# Patient Record
Sex: Female | Born: 1950
Health system: Southern US, Community
[De-identification: ages and names within clinical notes are randomized; demographics above are authoritative.]

## PROBLEM LIST (undated history)

## (undated) DIAGNOSIS — Z5189 Encounter for other specified aftercare: Secondary | ICD-10-CM

## (undated) DIAGNOSIS — K219 Gastro-esophageal reflux disease without esophagitis: Secondary | ICD-10-CM

## (undated) DIAGNOSIS — I499 Cardiac arrhythmia, unspecified: Secondary | ICD-10-CM

## (undated) DIAGNOSIS — M722 Plantar fascial fibromatosis: Secondary | ICD-10-CM

## (undated) DIAGNOSIS — T753XXA Motion sickness, initial encounter: Secondary | ICD-10-CM

## (undated) DIAGNOSIS — M549 Dorsalgia, unspecified: Secondary | ICD-10-CM

## (undated) DIAGNOSIS — M1990 Unspecified osteoarthritis, unspecified site: Secondary | ICD-10-CM

## (undated) DIAGNOSIS — M542 Cervicalgia: Secondary | ICD-10-CM

## (undated) DIAGNOSIS — R200 Anesthesia of skin: Secondary | ICD-10-CM

## (undated) DIAGNOSIS — G8929 Other chronic pain: Secondary | ICD-10-CM

## (undated) DIAGNOSIS — R112 Nausea with vomiting, unspecified: Secondary | ICD-10-CM

## (undated) DIAGNOSIS — Z9889 Other specified postprocedural states: Secondary | ICD-10-CM

## (undated) HISTORY — PX: UPPER GI ENDOSCOPY: SHX6162

## (undated) HISTORY — DX: Encounter for other specified aftercare: Z51.89

## (undated) HISTORY — PX: DILATION AND CURETTAGE OF UTERUS: SHX78

## (undated) HISTORY — DX: Gastro-esophageal reflux disease without esophagitis: K21.9

## (undated) HISTORY — PX: COLONOSCOPY: SHX174

## (undated) HISTORY — DX: Plantar fascial fibromatosis: M72.2

## (undated) HISTORY — PX: JOINT REPLACEMENT: SHX530

## (undated) HISTORY — PX: ABDOMINAL HYSTERECTOMY: SHX81

## (undated) HISTORY — PX: POLYPECTOMY: SHX149

---

## 1985-01-21 HISTORY — PX: TUBAL LIGATION: SHX77

## 1998-07-10 ENCOUNTER — Other Ambulatory Visit: Admission: RE | Admit: 1998-07-10 | Discharge: 1998-07-10 | Payer: Self-pay | Admitting: Family Medicine

## 1999-07-09 ENCOUNTER — Other Ambulatory Visit: Admission: RE | Admit: 1999-07-09 | Discharge: 1999-07-09 | Payer: Self-pay | Admitting: Family Medicine

## 1999-09-03 ENCOUNTER — Encounter: Admission: RE | Admit: 1999-09-03 | Discharge: 1999-09-03 | Payer: Self-pay | Admitting: Family Medicine

## 1999-09-03 ENCOUNTER — Encounter: Payer: Self-pay | Admitting: Family Medicine

## 2000-08-20 ENCOUNTER — Other Ambulatory Visit: Admission: RE | Admit: 2000-08-20 | Discharge: 2000-08-20 | Payer: Self-pay | Admitting: Family Medicine

## 2000-09-03 ENCOUNTER — Encounter: Payer: Self-pay | Admitting: Family Medicine

## 2000-09-03 ENCOUNTER — Encounter: Admission: RE | Admit: 2000-09-03 | Discharge: 2000-09-03 | Payer: Self-pay | Admitting: Family Medicine

## 2001-09-04 ENCOUNTER — Encounter: Payer: Self-pay | Admitting: Family Medicine

## 2001-09-04 ENCOUNTER — Encounter: Admission: RE | Admit: 2001-09-04 | Discharge: 2001-09-04 | Payer: Self-pay | Admitting: Family Medicine

## 2001-10-15 ENCOUNTER — Other Ambulatory Visit: Admission: RE | Admit: 2001-10-15 | Discharge: 2001-10-15 | Payer: Self-pay | Admitting: Family Medicine

## 2002-12-21 ENCOUNTER — Encounter: Admission: RE | Admit: 2002-12-21 | Discharge: 2002-12-21 | Payer: Self-pay | Admitting: Family Medicine

## 2003-01-05 ENCOUNTER — Other Ambulatory Visit: Admission: RE | Admit: 2003-01-05 | Discharge: 2003-01-05 | Payer: Self-pay | Admitting: Family Medicine

## 2004-04-23 ENCOUNTER — Ambulatory Visit: Payer: Self-pay | Admitting: Internal Medicine

## 2004-05-01 ENCOUNTER — Ambulatory Visit: Payer: Self-pay | Admitting: Family Medicine

## 2004-09-07 ENCOUNTER — Other Ambulatory Visit: Admission: RE | Admit: 2004-09-07 | Discharge: 2004-09-07 | Payer: Self-pay | Admitting: Family Medicine

## 2004-09-07 ENCOUNTER — Ambulatory Visit: Payer: Self-pay | Admitting: Family Medicine

## 2004-09-21 ENCOUNTER — Ambulatory Visit: Payer: Self-pay | Admitting: Family Medicine

## 2004-09-28 ENCOUNTER — Encounter: Admission: RE | Admit: 2004-09-28 | Discharge: 2004-09-28 | Payer: Self-pay | Admitting: Family Medicine

## 2005-09-30 ENCOUNTER — Encounter: Admission: RE | Admit: 2005-09-30 | Discharge: 2005-09-30 | Payer: Self-pay | Admitting: Family Medicine

## 2005-11-01 ENCOUNTER — Ambulatory Visit: Payer: Self-pay | Admitting: Family Medicine

## 2005-11-01 ENCOUNTER — Other Ambulatory Visit: Admission: RE | Admit: 2005-11-01 | Discharge: 2005-11-01 | Payer: Self-pay | Admitting: Family Medicine

## 2005-11-01 ENCOUNTER — Encounter: Payer: Self-pay | Admitting: Family Medicine

## 2005-11-08 ENCOUNTER — Ambulatory Visit: Payer: Self-pay | Admitting: Family Medicine

## 2005-11-15 LAB — FECAL OCCULT BLOOD, GUAIAC: Fecal Occult Blood: NEGATIVE

## 2005-11-21 ENCOUNTER — Ambulatory Visit: Payer: Self-pay | Admitting: Family Medicine

## 2006-04-24 ENCOUNTER — Ambulatory Visit: Payer: Self-pay | Admitting: Family Medicine

## 2006-04-24 LAB — CONVERTED CEMR LAB
Basophils Relative: 0.8 % (ref 0.0–1.0)
Eosinophils Relative: 2.5 % (ref 0.0–5.0)
H Pylori IgG: NEGATIVE
Monocytes Relative: 8.6 % (ref 3.0–11.0)
Platelets: 285 10*3/uL (ref 150–400)
RBC: 4.43 M/uL (ref 3.87–5.11)
RDW: 11.9 % (ref 11.5–14.6)
WBC: 6.1 10*3/uL (ref 4.5–10.5)

## 2006-05-13 ENCOUNTER — Ambulatory Visit: Payer: Self-pay | Admitting: Gastroenterology

## 2006-05-13 LAB — CONVERTED CEMR LAB
ALT: 19 units/L (ref 0–40)
AST: 23 units/L (ref 0–37)
Basophils Relative: 1.3 % — ABNORMAL HIGH (ref 0.0–1.0)
Bilirubin, Direct: 0.1 mg/dL (ref 0.0–0.3)
CO2: 32 meq/L (ref 19–32)
Calcium: 9.1 mg/dL (ref 8.4–10.5)
Eosinophils Relative: 2.5 % (ref 0.0–5.0)
GFR calc Af Amer: 133 mL/min
Glucose, Bld: 144 mg/dL — ABNORMAL HIGH (ref 70–99)
HCT: 39.7 % (ref 36.0–46.0)
Hemoglobin: 13.7 g/dL (ref 12.0–15.0)
Lymphocytes Relative: 31.2 % (ref 12.0–46.0)
Neutro Abs: 3.3 10*3/uL (ref 1.4–7.7)
Neutrophils Relative %: 57.9 % (ref 43.0–77.0)
Platelets: 283 10*3/uL (ref 150–400)
Sodium: 143 meq/L (ref 135–145)
Total Protein: 6.5 g/dL (ref 6.0–8.3)
WBC: 5.6 10*3/uL (ref 4.5–10.5)

## 2006-06-06 ENCOUNTER — Ambulatory Visit: Payer: Self-pay | Admitting: Gastroenterology

## 2006-06-06 ENCOUNTER — Encounter: Payer: Self-pay | Admitting: Gastroenterology

## 2006-06-06 ENCOUNTER — Encounter: Payer: Self-pay | Admitting: Family Medicine

## 2006-06-06 LAB — HM COLONOSCOPY

## 2006-09-10 ENCOUNTER — Ambulatory Visit: Payer: Self-pay | Admitting: Gastroenterology

## 2006-10-08 ENCOUNTER — Encounter: Admission: RE | Admit: 2006-10-08 | Discharge: 2006-10-08 | Payer: Self-pay | Admitting: Family Medicine

## 2006-10-16 ENCOUNTER — Encounter (INDEPENDENT_AMBULATORY_CARE_PROVIDER_SITE_OTHER): Payer: Self-pay | Admitting: *Deleted

## 2006-10-20 ENCOUNTER — Encounter: Payer: Self-pay | Admitting: Family Medicine

## 2006-10-20 DIAGNOSIS — M25519 Pain in unspecified shoulder: Secondary | ICD-10-CM

## 2006-10-20 DIAGNOSIS — K219 Gastro-esophageal reflux disease without esophagitis: Secondary | ICD-10-CM

## 2006-10-22 ENCOUNTER — Ambulatory Visit: Payer: Self-pay | Admitting: Gastroenterology

## 2006-11-18 ENCOUNTER — Ambulatory Visit: Payer: Self-pay | Admitting: Family Medicine

## 2006-11-25 ENCOUNTER — Ambulatory Visit: Payer: Self-pay | Admitting: Family Medicine

## 2006-11-26 LAB — CONVERTED CEMR LAB
Basophils Relative: 0.7 % (ref 0.0–1.0)
CO2: 31 meq/L (ref 19–32)
Cholesterol: 176 mg/dL (ref 0–200)
Creatinine, Ser: 0.7 mg/dL (ref 0.4–1.2)
HCT: 39.6 % (ref 36.0–46.0)
Hemoglobin: 13.5 g/dL (ref 12.0–15.0)
LDL Cholesterol: 99 mg/dL (ref 0–99)
Monocytes Absolute: 0.5 10*3/uL (ref 0.2–0.7)
Neutrophils Relative %: 55.2 % (ref 43.0–77.0)
Potassium: 4.4 meq/L (ref 3.5–5.1)
RDW: 12.8 % (ref 11.5–14.6)
Sodium: 140 meq/L (ref 135–145)
TSH: 0.71 microintl units/mL (ref 0.35–5.50)
Total Bilirubin: 1 mg/dL (ref 0.3–1.2)
Total Protein: 7 g/dL (ref 6.0–8.3)
VLDL: 8 mg/dL (ref 0–40)

## 2007-03-31 DIAGNOSIS — K209 Esophagitis, unspecified without bleeding: Secondary | ICD-10-CM | POA: Insufficient documentation

## 2007-03-31 DIAGNOSIS — D126 Benign neoplasm of colon, unspecified: Secondary | ICD-10-CM

## 2007-10-09 ENCOUNTER — Telehealth: Payer: Self-pay | Admitting: Gastroenterology

## 2007-10-09 ENCOUNTER — Encounter: Admission: RE | Admit: 2007-10-09 | Discharge: 2007-10-09 | Payer: Self-pay | Admitting: Family Medicine

## 2007-11-02 ENCOUNTER — Ambulatory Visit: Payer: Self-pay | Admitting: Gastroenterology

## 2007-11-02 DIAGNOSIS — Z860100 Personal history of colon polyps, unspecified: Secondary | ICD-10-CM | POA: Insufficient documentation

## 2008-07-06 ENCOUNTER — Encounter: Payer: Self-pay | Admitting: Family Medicine

## 2008-07-06 ENCOUNTER — Ambulatory Visit: Payer: Self-pay | Admitting: Family Medicine

## 2008-07-06 ENCOUNTER — Other Ambulatory Visit: Admission: RE | Admit: 2008-07-06 | Discharge: 2008-07-06 | Payer: Self-pay | Admitting: Family Medicine

## 2008-07-06 DIAGNOSIS — D485 Neoplasm of uncertain behavior of skin: Secondary | ICD-10-CM

## 2008-07-06 LAB — HM PAP SMEAR

## 2008-07-08 LAB — CONVERTED CEMR LAB
ALT: 22 units/L (ref 0–35)
Albumin: 3.9 g/dL (ref 3.5–5.2)
BUN: 16 mg/dL (ref 6–23)
Basophils Relative: 0.5 % (ref 0.0–3.0)
Chloride: 98 meq/L (ref 96–112)
Cholesterol: 178 mg/dL (ref 0–200)
Eosinophils Relative: 1.2 % (ref 0.0–5.0)
HCT: 38.8 % (ref 36.0–46.0)
Hemoglobin: 13.3 g/dL (ref 12.0–15.0)
LDL Cholesterol: 91 mg/dL (ref 0–99)
Lymphs Abs: 1.8 10*3/uL (ref 0.7–4.0)
MCV: 90.6 fL (ref 78.0–100.0)
Monocytes Absolute: 0.5 10*3/uL (ref 0.1–1.0)
Neutro Abs: 3 10*3/uL (ref 1.4–7.7)
Platelets: 269 10*3/uL (ref 150.0–400.0)
Potassium: 4.2 meq/L (ref 3.5–5.1)
TSH: 0.5 microintl units/mL (ref 0.35–5.50)
Total Bilirubin: 1.1 mg/dL (ref 0.3–1.2)
Total Protein: 7 g/dL (ref 6.0–8.3)
WBC: 5.4 10*3/uL (ref 4.5–10.5)

## 2008-07-11 ENCOUNTER — Encounter (INDEPENDENT_AMBULATORY_CARE_PROVIDER_SITE_OTHER): Payer: Self-pay | Admitting: *Deleted

## 2008-07-22 ENCOUNTER — Encounter: Payer: Self-pay | Admitting: Family Medicine

## 2008-07-22 ENCOUNTER — Ambulatory Visit: Payer: Self-pay | Admitting: Family Medicine

## 2008-10-11 ENCOUNTER — Encounter: Admission: RE | Admit: 2008-10-11 | Discharge: 2008-10-11 | Payer: Self-pay | Admitting: Family Medicine

## 2008-10-12 ENCOUNTER — Encounter (INDEPENDENT_AMBULATORY_CARE_PROVIDER_SITE_OTHER): Payer: Self-pay | Admitting: *Deleted

## 2008-11-21 ENCOUNTER — Telehealth: Payer: Self-pay | Admitting: Gastroenterology

## 2009-07-13 ENCOUNTER — Ambulatory Visit: Payer: Self-pay | Admitting: Family Medicine

## 2009-07-14 LAB — CONVERTED CEMR LAB
AST: 18 units/L (ref 0–37)
Albumin: 3.9 g/dL (ref 3.5–5.2)
Alkaline Phosphatase: 62 units/L (ref 39–117)
Basophils Relative: 0.9 % (ref 0.0–3.0)
Bilirubin, Direct: 0.1 mg/dL (ref 0.0–0.3)
Calcium: 9.3 mg/dL (ref 8.4–10.5)
Creatinine, Ser: 0.7 mg/dL (ref 0.4–1.2)
GFR calc non Af Amer: 93.5 mL/min (ref >60)
HDL: 65.3 mg/dL (ref >39.00)
Hemoglobin: 12 g/dL (ref 12.0–15.0)
LDL Cholesterol: 93 mg/dL (ref 0–99)
Lymphocytes Relative: 29.7 % (ref 12.0–46.0)
Monocytes Relative: 8.5 % (ref 3.0–12.0)
Neutro Abs: 3.5 10*3/uL (ref 1.4–7.7)
Neutrophils Relative %: 57.9 % (ref 43.0–77.0)
RBC: 3.85 M/uL — ABNORMAL LOW (ref 3.87–5.11)
Sodium: 141 meq/L (ref 135–145)
Total CHOL/HDL Ratio: 3
Total Protein: 6.4 g/dL (ref 6.0–8.3)
Triglycerides: 47 mg/dL (ref 0.0–149.0)
WBC: 6.1 10*3/uL (ref 4.5–10.5)

## 2009-07-28 ENCOUNTER — Ambulatory Visit: Payer: Self-pay | Admitting: Family Medicine

## 2009-07-28 DIAGNOSIS — M722 Plantar fascial fibromatosis: Secondary | ICD-10-CM

## 2009-10-19 ENCOUNTER — Encounter: Admission: RE | Admit: 2009-10-19 | Discharge: 2009-10-19 | Payer: Self-pay | Admitting: Family Medicine

## 2009-10-19 LAB — HM MAMMOGRAPHY: HM Mammogram: NORMAL

## 2009-10-23 ENCOUNTER — Encounter: Payer: Self-pay | Admitting: Family Medicine

## 2009-11-13 ENCOUNTER — Telehealth: Payer: Self-pay | Admitting: Gastroenterology

## 2009-12-18 ENCOUNTER — Ambulatory Visit: Payer: Self-pay | Admitting: Gastroenterology

## 2010-01-25 ENCOUNTER — Telehealth: Payer: Self-pay | Admitting: Family Medicine

## 2010-02-05 ENCOUNTER — Telehealth: Payer: Self-pay | Admitting: Family Medicine

## 2010-02-08 ENCOUNTER — Telehealth: Payer: Self-pay | Admitting: Family Medicine

## 2010-02-09 ENCOUNTER — Ambulatory Visit
Admission: RE | Admit: 2010-02-09 | Discharge: 2010-02-09 | Payer: Self-pay | Source: Home / Self Care | Attending: Family Medicine | Admitting: Family Medicine

## 2010-02-20 NOTE — Letter (Signed)
Summary: Results Follow up Letter  [facility]  [address] [place], [place] [address]   Phone: [phone]  Fax: [phone]    10/23/2009 MRN: [id number]    OVETA IDRIS [address] [place], [place]  [address]    Dear Ms. Christen Bame,  The following are the results of your recent test(s):  Test         Result    Pap Smear:        Normal _____  Not Normal _____ Comments: ______________________________________________________ Cholesterol: LDL(Bad cholesterol):         Your goal is less than:         HDL (Good cholesterol):       Your goal is more than: Comments:  ______________________________________________________ Mammogram:        Normal ___X__  Not Normal _____ Comments:Repeat in one year.   ___________________________________________________________________ Hemoccult:        Normal _____  Not normal _______ Comments:    _____________________________________________________________________ Other Tests:    We routinely do not discuss normal results over the telephone.  If you desire a copy of the results, or you have any questions about this information we can discuss them at your next office visit.   Sincerely,    Idamae Schuller Tower,MD  MT/ri

## 2010-02-20 NOTE — Progress Notes (Signed)
Summary: rx request   Phone Note Call from Patient Call back at [phone]   Caller: Patient Call For: Dr. Christella Hartigan Reason for Call: Refill Medication Summary of Call: would like a new rx [facility] in to [facility] [address], [phone] Initial call taken by: Vallarie Mare,  November 13, 2009 9:21 AM  Follow-up for Phone Call        pt scheduled ROV it has been 2 years since last office visit Follow-up by: Chales Abrahams CMA Duncan Dull),  November 13, 2009 10:48 AM    Prescriptions: PROTONIX 40 MG  TBEC (PANTOPRAZOLE SODIUM) one by mouth once daily (best taken 20-30 min prior to breakfast meal)  #30 x 11   Entered by:   Chales Abrahams CMA (AAMA)   Authorized by:   Rachael Fee MD   Signed by:   Chales Abrahams CMA (AAMA) on 11/13/2009   Method used:   Electronically to        [facility]  [address] [phone]* (retail)       [address]       [place], [place]  [address]       Ph: [phone]       Fax: [phone]   RxID:   [id number]

## 2010-02-20 NOTE — Assessment & Plan Note (Signed)
Summary: CPX/DLO   Vital Signs:  Patient profile:   60 year old female Height:      69.25 inches Weight:      170 pounds BMI:     25.01 Temp:     98.3 degrees F oral Pulse rate:   64 / minute Pulse rhythm:   regular BP sitting:   116 / 70  (left arm) Cuff size:   regular  Vitals Entered By: Lewanda Rife LPN (July 28, 1608 2:22 PM) CC: CPX with pap and breast exam LMP 11 yrs ago   History of Present Illness: here for health mt exam and to disc chronic problems  feeling fine- nothing new   tries to take good care of herself  wt is stable   bp is 116/70  nl dexa in 8/08  BTL in past  is menopausal is taking calcium and vit D and also gets outdoors   lipids this check fairly stable - with trig 47 and HDL 65 and LDL 93 pap 6/10--never had abn pap  no gyn symptoms or problems  mam 9/10  no lumps on self breast exam   colon polyps - due colonosc in 2013- no stool changes   gluc 110 this draw   is doing well on protonix -- no problems with that   no hx of fractures    Allergies: 1)  Neosporin  Past History:  Past Surgical History: Last updated: 03/31/2007 Dexa- normal (08/2000) Colonoscopy/ EGD- esophagitis, polyps (05/2006) Dexa- normal (08/2006) Tubal ligation 1987  Family History: Last updated: 2006-12-10 Father: deceased age 39- suicide Mother: 2 strokes, congenital arrhythmia, chf, depression Siblings: 1 brother aunt breast ca  aunt OP brother prostate ca  Social History: Last updated: 10/20/2006 Marital Status: Married Children: 2- 1 is adopted Occupation: Runner, broadcasting/film/video  Risk Factors: Smoking Status: never (10/20/2006)  Past Medical History: GERD with esophagitis  plantar fasciits L   Review of Systems General:  Denies fatigue, loss of appetite, and malaise. Eyes:  Denies blurring and eye irritation. CV:  Denies chest pain or discomfort, lightheadness, palpitations, and shortness of breath with exertion. Resp:  Denies cough, shortness of  breath, and wheezing. GI:  Denies abdominal pain, change in bowel habits, indigestion, and nausea. GU:  Denies abnormal vaginal bleeding, discharge, dysuria, and urinary frequency. MS:  Denies joint pain, cramps, and muscle weakness. Derm:  Denies itching, lesion(s), poor wound healing, and rash. Neuro:  Denies numbness and tingling. Psych:  Denies anxiety and depression. Endo:  Denies cold intolerance, excessive thirst, and heat intolerance. Heme:  Denies abnormal bruising and bleeding.  Physical Exam  General:  Well-developed,well-nourished,in no acute distress; alert,appropriate and cooperative throughout examination Head:  normocephalic, atraumatic, and no abnormalities observed.   Eyes:  vision grossly intact, pupils equal, pupils round, and pupils reactive to light.  no conjunctival pallor, injection or icterus  Ears:  R ear normal and L ear normal.   Nose:  no nasal discharge.   Mouth:  pharynx pink and moist.   Neck:  supple with full rom and no masses or thyromegally, no JVD or carotid bruit  Chest Wall:  No deformities, masses, or tenderness noted. Breasts:  No mass, nodules, thickening, tenderness, bulging, retraction, inflamation, nipple discharge or skin changes noted.   Lungs:  Normal respiratory effort, chest expands symmetrically. Lungs are clear to auscultation, no crackles or wheezes. Heart:  Normal rate and regular rhythm. S1 and S2 normal without gallop, murmur, click, rub or other extra sounds. Abdomen:  Bowel sounds positive,abdomen soft and non-tender without masses, organomegaly or hernias noted. no renal bruits  Msk:  strap on L foot -mild tenderness in arch no kyphosis no acute joint changes Pulses:  R and L carotid,radial,femoral,dorsalis pedis and posterior tibial pulses are full and equal bilaterally Extremities:  No clubbing, cyanosis, edema, or deformity noted with normal full range of motion of all joints.   Neurologic:  sensation intact to light touch,  gait normal, and DTRs symmetrical and normal.   Skin:  Intact without suspicious lesions or rashes Cervical Nodes:  No lymphadenopathy noted Axillary Nodes:  No palpable lymphadenopathy Inguinal Nodes:  No significant adenopathy Psych:  normal affect, talkative and pleasant    Impression & Recommendations:  Problem # 1:  HEALTH MAINTENANCE EXAM (ICD-V70.0) Assessment Comment Only reviewed health habits including diet, exercise and skin cancer prevention reviewed health maintenance list and family history enc to keep up the good health habits  rev labs in detail today slt dec hb from recent blood donation  Problem # 2:  PERSONAL HX COLONIC POLYPS (ICD-V12.72) Assessment: Comment Only due colonosc 2013- no symptoms  Problem # 3:  GERD (ICD-530.81) Assessment: Unchanged doing well with protonix and diet- no problems  Her updated medication list for this problem includes:    Protonix 40 Mg Tbec (Pantoprazole sodium) ..... One by mouth once daily (best taken 20-30 min prior to breakfast meal)  Problem # 4:  OTHER SCREENING MAMMOGRAM (ICD-V76.12) Assessment: New annual mammogram scheduled adv pt to continue regular self breast exams non remarkable breast exam today  Orders: Radiology Referral (Radiology)  Problem # 5:  PLANTAR FASCIITIS, LEFT (ICD-728.71) Assessment: New wearing strap today- slowly imp after 2 inj  will continue orthotics and f/u with podiatrist  Complete Medication List: 1)  Sm Calcium 500 Mg Tabs (Oyster shell) .... Two by mouth qd 2)  Protonix 40 Mg Tbec (Pantoprazole sodium) .... One by mouth once daily (best taken 20-30 min prior to breakfast meal) 3)  Glucosamine-chondroitin-msm 500-250-250 Mg Caps (Glucosamine-chondroitin-msm) .... By mouth daily  Patient Instructions: 1)  we will schedule annual mammogram (due in sept) at check out  2)  no change in medicines  3)  try to keep up healthy diet and exercise   Current Allergies (reviewed  today): NEOSPORIN

## 2010-02-20 NOTE — Assessment & Plan Note (Signed)
  GASTROINTESTINAL PROBLEM LIST: 1. Mild esophagitis seen May 2008, EGD by me.  Biopsy showed no Barrett's.  Fundic gland polyps also biopsied showed no neoplasia.  November, 2011: GERD symptoms under good control on protonix. once daily 2. Colonic tubular adenomas May 2008.  Next colonoscopy May 2013.   History of Present Illness Visit Type: Follow-up Visit Primary GI MD: Rob Bunting MD Primary Provider: [facility] Chief Complaint: Med refills History of Present Illness:     very pleasant 60 year old woman whom I last saw about 2 years ago. she's taking protonix once daily, first thing in AM and eats BF 30-40 min later.  On this regimine she feels very good.  Only rare pyrosis. She will take a tums as needed (about once a month). this is much better than it was previously.  She feels that the PPI helps with her loose stools.  She has mild pill associated dysphagia (not foods or water), overall her weight is up a bit.  9 pounds since last visit here 2 years ago.  NO overt GI bleeding.           Current Medications (verified): 1)  Sm Calcium 500 Mg  Tabs (Oyster Shell) .... Two By Mouth Qd 2)  Protonix 40 Mg  Tbec (Pantoprazole Sodium) .... One By Mouth Once Daily (Best Taken 20-30 Min Prior To Breakfast Meal) 3)  Glucosamine-Chondroitin-Msm 500-250-250 Mg Caps (Glucosamine-Chondroitin-Msm) .... By Mouth Daily  Allergies (verified): 1)  Neosporin  Vital Signs:  Patient profile:   60 year old female Height:      69.25 inches Weight:      174 pounds BMI:     25.60 Pulse rate:   72 / minute Pulse rhythm:   regular BP sitting:   90 / 58  (left arm) Cuff size:   regular  Vitals Entered By: June McMurray CMA Duncan Dull) (December 18, 2009 8:47 AM)  Physical Exam  Additional Exam:  Constitutional: generally well appearing Psychiatric: alert and oriented times 3 Abdomen: soft, non-tender, non-distended, normal bowel sounds    Impression & Recommendations:  Problem # 1:   GERD, no alarm symptoms she has some very mild he'll associated dysphasia but no dysphasia to food or water. Her weight is increasing and she has no overt GI bleeding. She will continue taking proton pump inhibitor once daily in the morning and even try to cut back to every other day if tolerated.  Problem # 2:  tubular adenomas she is set up for repeat colonoscopy at five-year interval from her last procedure.  Patient Instructions: 1)  Stay on protontix one pill in AM.  Try cutting back to every other day, if tolerated. 2)  Next colonoscopy is May 2013, you will get a reminder in the mail around then. 3)  A copy of this information will be sent to Dr. Milinda Antis. 4)  The medication list was reviewed and reconciled.  All changed / newly prescribed medications were explained.  A complete medication list was provided to the patient / caregiver.

## 2010-02-22 NOTE — Progress Notes (Signed)
Summary: wants tdap  Phone Note Call from Patient Call back at [phone]   Caller: Patient Call For: Judith Part MD Summary of Call: Patient says that she is around her grandson alot and he is 35 months old. She is really concerned with the pertussis going around. She is asking if she could get the vaccine. Is it okay to schedule nurse visit for this? Initial call taken by: Melody Comas,  February 08, 2010 9:36 AM  Follow-up for Phone Call        that is fine  Follow-up by: Judith Part MD,  February 08, 2010 10:56 AM  Additional Follow-up for Phone Call Additional follow up Details #1::        Message left on voicemail notifying patient that it was okay to have vaccine and to call back and schedule a nurse visit.  Additional Follow-up by: Janee Morn CMA Duncan Dull),  February 08, 2010 11:16 AM

## 2010-02-22 NOTE — Progress Notes (Signed)
Summary: regarding skin tag  Phone Note Call from Patient Call back at [phone]   Caller: Patient Summary of Call: Pt states she has a skin tag on her face that she wants removed.  She said this is on a long stalk and it gets irritated.  She is asking if you can do this or shoud she go to a dermatologist. Initial call taken by: Lowella Petties CMA, AAMA,  January 25, 2010 9:41 AM  Follow-up for Phone Call        I generally refer face work to derm  would she like a ref or make her own appt? Follow-up by: Judith Part MD,  January 25, 2010 12:52 PM  Additional Follow-up for Phone Call Additional follow up Details #1::        Patient notified as instructed by telephone. Pt said she is established with a dermatologist in [place] and will make her own appt.Lewanda Rife LPN  January 25, 2010 2:58 PM

## 2010-02-22 NOTE — Progress Notes (Signed)
Summary: pt wants Hep A vaccine  Phone Note Call from Patient   Caller: Patient Call For: Judith Part MD Summary of Call: [place] and was told she should get Hep A vaccine, please advise. Initial call taken by: Lowella Petties CMA, AAMA,  February 05, 2010 9:50 AM  Follow-up for Phone Call        can schedule that here  may want to check with insurance first  I think it is a good idea if she has never had one Follow-up by: Judith Part MD,  February 05, 2010 10:47 AM  Additional Follow-up for Phone Call Additional follow up Details #1::        Left message on machine for patient to call back. Sydell Axon LPN  February 05, 2010 10:57 AM  Patient notified as instructed by telephone. Additional Follow-up by: Sydell Axon LPN,  February 05, 2010 11:00 AM

## 2010-02-22 NOTE — Assessment & Plan Note (Signed)
Summary: tdap/[place]ola Truax/alc   Nurse Visit   Allergies: 1)  Neosporin  Immunizations Administered:  Tetanus Vaccine:    Vaccine Type: Tdap    Site: right deltoid    Mfr: GlaxoSmithKline    Dose: 0.5 ml    Route: IM    Given by: Linde Gillis CMA (AAMA)    Exp. Date: 11/10/2011    Lot #: [id number]    VIS given: 12/09/07 version given February 09, 2010.  Orders Added: 1)  Tdap => 82yrs IM [90715] 2)  Admin 1st Vaccine [09811]

## 2010-06-05 NOTE — Assessment & Plan Note (Signed)
[facility]                         GASTROENTEROLOGY OFFICE NOTE   NAME:ANGYALDerrick, Orris                    MRN:          [id number]  DATE:10/22/2006                            DOB:          Oct 23, 1950    PRIMARY CARE PHYSICIAN:  Dr. Roxy Manns.   GASTROINTESTINAL PROBLEM LIST:  1. Mild esophagitis seen May 2008, EGD by me.  Biopsy showed no      Barrett's.  Fundic gland polyps also biopsied showed no neoplasia.  2. Colonic tubular adenomas May 2008.  Next colonoscopy May 2013.   INTERVAL HISTORY:  I last saw Crystalee a month and a half ago. Since  then she has been on Protonix 1 pill a day taken 20 to 30 minutes prior  to her breakfast meal. She says for the most part she feels very well,  although she does have intermittent breakthrough symptoms and she will  take either Tagamet or Prilosec and that helps very reliably. As I have  described before her symptoms are unusual for GERD (urgent bowel  movement, pains in her left lower quadrant) but these symptoms are very  reliably improved on antacid medicines.   CURRENT MEDICATIONS:  1. Multivitamins.  2. Calcium.  3. SAM-e melatonin.  4. Protonix 40 mg once daily.   PHYSICAL EXAMINATION:  Weight 169 pounds which is up 2 pounds since our  last visit. Blood pressure 112/72, pulse 68.  CONSTITUTIONALLY: Generally well-appearing.  ABDOMEN: Soft, nontender, nondistended. Normal bowel sounds.   ASSESSMENT/PLAN:  A 60 year old woman with intermittent abdominal pains  and intermittent loose stools.   It is unusual to have this symptom complex with gastroesophageal reflux  disease but it is hard to argue the fact that her symptoms are so  reliably improved on proton pump inhibitors. She will continue taking  Protonix for now once daily. She knows to take breakthrough antacid  medicines as needed. I recommended she try cutting back on the antacids  periodically to see if she is able to wean herself  off.     Rachael Fee, MD  Electronically Signed   DPJ/MedQ  DD: 10/22/2006  DT: 10/23/2006  Job #: [id number]   cc:   Marne A. Milinda Antis, MD

## 2010-06-05 NOTE — Assessment & Plan Note (Signed)
[facility]                         GASTROENTEROLOGY OFFICE NOTE   NAME:[name], Shannon [name]                    MRN:          [id number]  DATE:09/10/2006                            DOB:          [birth year]    PRIMARY CARE PHYSICIAN:  Shannon A. Tower, MD.   GASTROINTESTINAL PROBLEM LIST:  1. Mild esophagitis seen May 2008, EGD by me.  Biopsy showed no      Barrett's.  Fundic gland polyps also biopsied showed no neoplasia.  2. Colonic tubular adenomas May 2008.  Next colonoscopy May 2013.   INTERVAL HISTORY:  I last saw Shannon [name] at the time of her upper and  lower endoscopies three months ago.  She has very unusual compilation of  symptoms in that she has epigastric discomfort, urgent bowel movements,  pains in her left lower quadrant.  She says on antacids medicines, all  these symptoms are very much improved.  She was taking Prilosec OTC but  that seemed to wane in its efficacy and so she started on Protonix 40 a  day and that is definitely helping.   CURRENT MEDICATIONS:  1. Multivitamin.  2. Calcium.  3. SAM-e.  4. Melatonin.  5. Protonix 40 mg once daily.   PHYSICAL EXAMINATION:  VITAL SIGNS:  Weight 167 pounds which is up 1  pound since her last visit.  Blood pressure 104/66, pulse 64.  GENERAL APPEARANCE:  Generally well-appearing.  ABDOMEN:  Soft, nontender, nondistended with normal bowel sounds.   ASSESSMENT/PLAN:  A 60 year old woman with intermittent abdominal pains  and loose stools.   She is very convinced that acid suppression with proton pump inhibitors  definitely improve her intermittent symptoms.  She has no classic  gastroesophageal reflux disease symptoms.  She does, however, have  esophagitis so I have kept her on proton pump inhibitor even though I do  not think that gastroesophageal reflux disease necessarily explains all  of her symptoms.  Perhaps she simply has irritable bowel syndrome that  alternates.  I recommended  that she continue on the antacid medicine  daily and I will call her in a prescription for Levsin to be taken  on an as needed basis when she is having discomfort.  She will return to  see me in six to eight weeks and sooner as needed.     Shannon Fee, MD  Electronically Signed    DPJ/MedQ  DD: 09/10/2006  DT: 09/11/2006  Job #: [id number]   cc:   Shannon A. Milinda Antis, MD

## 2010-06-08 NOTE — Assessment & Plan Note (Signed)
[facility]                         GASTROENTEROLOGY OFFICE NOTE   NAME:Capuano, CARLIYAH COTTERMAN                    MRN:          [id number]  DATE:05/13/2006                            DOB:          1950-09-02    Dr. Milinda Antis asked me to evaluate Ms. Prater in consultation regarding  intermittent abdominal pains, bowel symptoms, question reflux.   HISTORY OF PRESENT ILLNESS:  Ms. Acero is a very pleasant 60 year old  woman who has had 10 years of intermittent abdominal discomforts.  She  describes the pain as in her left lower quadrant associated with a  change in her bowel habits, usually urgent BMs, some increased frequency  up to 8-10 times a day, however they are not watery or bloody.  This  will go on for several days, she has found that if she takes anti-acid  medicines such as Prilosec OTC at the outset of these symptoms that that  will usually improve and lessen the severity, so she is not blaming GERD  on these symptoms.  They have become more frequent lately, in the past 5  years especially.  She has also noticed that eating salads tends to make  them worse.  She has no nausea or vomiting, no fevers or chills.   Recent lab testing April 2008 show a normal CBC and a negative H. pylori  serologies.   REVIEW OF SYSTEMS:  She has gained 5 pounds in the past year, she does  notice that eating excessive amounts of dairy tends to cause bloating  and discomforts.  The rest of her review of systems is essentially  normal and is available on her nursing intake sheet.   PAST MEDICAL HISTORY:  Tubal ligation 1987.   CURRENT MEDICATIONS:  1. Multivitamin.  2. Calcium.  3. Iron once daily.  4. SAM-e 400 mg once daily.  5. Melatonin.  6. Prilosec 20 mg once to twice a day.   ALLERGIES:  NO KNOWN DRUG ALLERGIES.   SOCIAL HISTORY:  Married with 2 sons.  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  No caffeine, no alcohol,  nonsmoker.   FAMILY HISTORY:  No colon cancer, colon polyps in family.   PHYSICAL EXAMINATION:  Height 5 foot 10 inches, 166 pounds, blood  pressure 92/60, pulse 60.  CONSTITUTIONAL:  Generally well-appearing.  NEUROLOGIC:  Alert and oriented x3.  EYES:  Extraocular movements intact.  MOUTH:  Oropharynx moist, no lesions.  NECK:  Supple, no lymphadenopathy.  CARDIOVASCULAR:  Heart regular rate and rhythm.  LUNGS:  Clear to auscultation bilaterally.  ABDOMEN:  Soft, nontender, nondistended, normal bowel sounds.  EXTREMITIES:  No lower extremity edema.  SKIN:  No rash or lesions on visible extremities.   ASSESSMENT/PLAN:  60 year old woman with recurrent left-sided  lower abdominal discomforts, bowel symptoms, somewhat improved by anti-  acid medicines.   It is very rare for gastroesophageal reflux disease or acid related  phenomenons to cause such low left-sided abdominal discomfort associated  with bowel changes.  She is very clear however that this symptom complex  is usually improved by an anti-acid  medicine.  Much more common is  irritable bowel or perhaps mild diverticulitis, although I do not think  she currently has diverticulitis.  I think we should proceed with a full  colonoscopy as well as upper endoscopy.  Perhaps she does have bad  gastroesophageal reflux disease and esophagitis.  If that is the case  and her colonoscopy is normal, then I will get her on twice daily anti-  acid medicines.  If however her exams are normal I will try  antispasmodics and periodic Imodium.  We will therefore arrange for her  to have colonoscopy as well as upper endoscopy at her soonest  convenience.  I am also getting complete metabolic panel, thyroid  testing, and sprue testing by labs.     Rachael Fee, MD  Electronically Signed    DPJ/MedQ  DD: 05/13/2006  DT: 05/13/2006  Job #: [id number]   cc:   Marne A. Milinda Antis, MD

## 2010-07-26 ENCOUNTER — Telehealth: Payer: Self-pay | Admitting: Family Medicine

## 2010-07-26 DIAGNOSIS — Z0000 Encounter for general adult medical examination without abnormal findings: Secondary | ICD-10-CM

## 2010-07-26 NOTE — Telephone Encounter (Signed)
Message copied by Judy Pimple on Thu Jul 26, 2010  8:30 AM ------      Message from: Baldomero Lamy      Created: Tue Jul 24, 2010 12:00 PM      Regarding: cpx labs fri       Please order  future cpx labs for pt's upcomming lab appt.      Thanks      Rodney Booze

## 2010-07-27 ENCOUNTER — Other Ambulatory Visit (INDEPENDENT_AMBULATORY_CARE_PROVIDER_SITE_OTHER): Payer: BC Managed Care – PPO | Admitting: Family Medicine

## 2010-07-27 DIAGNOSIS — Z0000 Encounter for general adult medical examination without abnormal findings: Secondary | ICD-10-CM

## 2010-07-27 LAB — CBC WITH DIFFERENTIAL/PLATELET
Basophils Absolute: 0 10*3/uL (ref 0.0–0.1)
Eosinophils Relative: 1.9 % (ref 0.0–5.0)
HCT: 40.3 % (ref 36.0–46.0)
Hemoglobin: 13.7 g/dL (ref 12.0–15.0)
Lymphocytes Relative: 34.5 % (ref 12.0–46.0)
Lymphs Abs: 1.7 10*3/uL (ref 0.7–4.0)
Monocytes Relative: 8.3 % (ref 3.0–12.0)
Platelets: 258 10*3/uL (ref 150.0–400.0)
RDW: 13.5 % (ref 11.5–14.6)
WBC: 4.9 10*3/uL (ref 4.5–10.5)

## 2010-07-27 LAB — COMPREHENSIVE METABOLIC PANEL
ALT: 20 U/L (ref 0–35)
Albumin: 4.1 g/dL (ref 3.5–5.2)
CO2: 32 mEq/L (ref 19–32)
Calcium: 9.2 mg/dL (ref 8.4–10.5)
Chloride: 104 mEq/L (ref 96–112)
GFR: 75.08 mL/min (ref >60.00)
Glucose, Bld: 108 mg/dL — ABNORMAL HIGH (ref 70–99)
Potassium: 4.4 mEq/L (ref 3.5–5.1)
Sodium: 141 mEq/L (ref 135–145)
Total Bilirubin: 1.2 mg/dL (ref 0.3–1.2)
Total Protein: 6.5 g/dL (ref 6.0–8.3)

## 2010-07-27 LAB — LIPID PANEL
HDL: 69.5 mg/dL (ref >39.00)
Total CHOL/HDL Ratio: 3

## 2010-07-27 LAB — TSH: TSH: 0.66 u[IU]/mL (ref 0.35–5.50)

## 2010-07-28 ENCOUNTER — Encounter: Payer: Self-pay | Admitting: Family Medicine

## 2010-08-03 ENCOUNTER — Other Ambulatory Visit: Payer: Self-pay

## 2010-08-10 ENCOUNTER — Encounter: Payer: Self-pay | Admitting: Family Medicine

## 2010-08-10 ENCOUNTER — Ambulatory Visit (INDEPENDENT_AMBULATORY_CARE_PROVIDER_SITE_OTHER): Payer: BC Managed Care – PPO | Admitting: Family Medicine

## 2010-08-10 DIAGNOSIS — D126 Benign neoplasm of colon, unspecified: Secondary | ICD-10-CM

## 2010-08-10 DIAGNOSIS — Z0000 Encounter for general adult medical examination without abnormal findings: Secondary | ICD-10-CM

## 2010-08-10 NOTE — Assessment & Plan Note (Signed)
Due for colonosc 5/13-pt is aware (5 year f/u) No bowel or stool changes

## 2010-08-10 NOTE — Assessment & Plan Note (Signed)
Reviewed health habits including diet and exercise and skin cancer prevention Also reviewed health mt list, fam hx and immunizations   Wellness labs look good  Breast exam today

## 2010-08-10 NOTE — Progress Notes (Signed)
Subjective:    Patient ID: Shannon [name], female    DOB: [birth year], 60 y.o.   MRN: [id number]  HPI Here for annual exam / health mt and to review chronic med problems  Zoster status- never had the disease May be interested in vaccine   tdap is up to date   Has been doing pretty well - no complaints   Pap 6/10 Gyn- no gyn problems or symptoms   Mam 9/11 normal - wants to schedule herself  Self exam   colonosc 08 adenomatous polyp- will be due in 2013  No bowel changes or blood in stool   Wt is up 3 lb  Good bp  Wellness lab ok Sugar stabl 108  Lab Results  Component Value Date   CHOL 177 07/27/2010   CHOL 168 07/13/2009   CHOL 178 07/06/2008   Lab Results  Component Value Date   HDL 69.50 07/27/2010   HDL 65.30 07/13/2009   HDL 40.98 07/06/2008   Lab Results  Component Value Date   LDLCALC 98 07/27/2010   LDLCALC 93 07/13/2009   LDLCALC 91 07/06/2008   Lab Results  Component Value Date   TRIG 50.0 07/27/2010   TRIG 47.0 07/13/2009   TRIG 46.0 07/06/2008   Lab Results  Component Value Date   CHOLHDL 3 07/27/2010   CHOLHDL 3 07/13/2009   CHOLHDL 2 07/06/2008   No results found for this basename: LDLDIRECT   overall good   Patient Active Problem List  Diagnoses  . TUBULOVILLOUS ADENOMA, COLON  . NEOPLASM, SKIN, UNCERTAIN BEHAVIOR  . ESOPHAGITIS, MILD  . GERD  . PAIN IN JOINT, SHOULDER  . PLANTAR FASCIITIS, LEFT  . PERSONAL HX COLONIC POLYPS  . Routine general medical examination at a health care facility   Past Medical History  Diagnosis Date  . GERD (gastroesophageal reflux disease)     with esophagitis  . Plantar fasciitis     left   Past Surgical History  Procedure Date  . Tubal ligation 1987   History  Substance Use Topics  . Smoking status: Never Smoker   . Smokeless tobacco: Not on file  . Alcohol Use: Not on file   Family History  Problem Relation Age of Onset  . Stroke Mother     x 2  . Heart disease Mother     congenital arrhythmia  and CHF  . Depression Mother   . Cancer Brother     prostate   Allergies  Allergen Reactions  . Triple Antibiotic     REACTION: rash   Current Outpatient Prescriptions on File Prior to Visit  Medication Sig Dispense Refill  . Glucosamine-Chondroitin-MSM 500-250-250 MG CAPS Take 1 capsule by mouth 2 (two) times daily.       . pantoprazole (PROTONIX) 40 MG tablet Take 40 mg by mouth daily. Best taken 20-30 min prior to breakfast meal.            Review of Systems Review of Systems  Constitutional: Negative for fever, appetite change, fatigue and unexpected weight change.  Eyes: Negative for pain and visual disturbance.  Respiratory: Negative for cough and shortness of breath.   Cardiovascular: Negative.  for cp or sob or palp Gastrointestinal: Negative for nausea, diarrhea and constipation.  Genitourinary: Negative for urgency and frequency.  Skin: Negative for pallor.or rash  Neurological: Negative for weakness, light-headedness, numbness and headaches.  Hematological: Negative for adenopathy. Does not bruise/bleed easily.  Psychiatric/Behavioral: Negative for dysphoric mood. The patient is not  nervous/anxious.          Objective:   Physical Exam  Constitutional: She appears well-developed and well-nourished. No distress.  HENT:  Head: Normocephalic and atraumatic.  Right Ear: External ear normal.  Left Ear: External ear normal.  Nose: Nose normal.  Mouth/Throat: Oropharynx is clear and moist.  Eyes: EOM are normal. Pupils are equal, round, and reactive to light.  Neck: Normal range of motion. Neck supple. No JVD present. Carotid bruit is not present. No thyromegaly present.  Cardiovascular: Normal rate, regular rhythm, normal heart sounds and intact distal pulses.   Pulmonary/Chest: Effort normal and breath sounds normal. No respiratory distress. She has no rales.  Abdominal: Soft. Bowel sounds are normal. She exhibits no distension and no mass. There is no tenderness.    Genitourinary: No breast swelling, tenderness, discharge or bleeding.  Musculoskeletal: Normal range of motion. She exhibits no edema and no tenderness.  Lymphadenopathy:    She has no cervical adenopathy.  Neurological: She is alert. She has normal reflexes. No cranial nerve deficit. Coordination normal.  Skin: Skin is warm and dry. No rash noted. No erythema. No pallor.  Psychiatric: She has a normal mood and affect.          Assessment & Plan:

## 2010-08-10 NOTE — Patient Instructions (Signed)
If you are interested in shingles vaccine in future - call your insurance company to see how coverage is and call us to schedule  Don't forget to schedule your mammogram that is due in late September  Try to get regular exercise and eat a healthy diet  Your labs look good Colonoscopy will be due in 5 /13

## 2010-09-04 ENCOUNTER — Ambulatory Visit (INDEPENDENT_AMBULATORY_CARE_PROVIDER_SITE_OTHER): Payer: BC Managed Care – PPO | Admitting: *Deleted

## 2010-09-04 DIAGNOSIS — Z2911 Encounter for prophylactic immunotherapy for respiratory syncytial virus (RSV): Secondary | ICD-10-CM

## 2010-09-04 DIAGNOSIS — Z23 Encounter for immunization: Secondary | ICD-10-CM

## 2010-09-11 ENCOUNTER — Other Ambulatory Visit: Payer: Self-pay | Admitting: Family Medicine

## 2010-09-11 DIAGNOSIS — Z1231 Encounter for screening mammogram for malignant neoplasm of breast: Secondary | ICD-10-CM

## 2010-10-23 ENCOUNTER — Ambulatory Visit
Admission: RE | Admit: 2010-10-23 | Discharge: 2010-10-23 | Disposition: A | Discharge status: Home / Self Care | Payer: BC Managed Care – PPO | Source: Ambulatory Visit | Attending: Family Medicine | Admitting: Family Medicine

## 2010-10-23 DIAGNOSIS — Z1231 Encounter for screening mammogram for malignant neoplasm of breast: Secondary | ICD-10-CM

## 2010-10-26 ENCOUNTER — Encounter: Payer: Self-pay | Admitting: *Deleted

## 2010-11-02 ENCOUNTER — Other Ambulatory Visit: Payer: Self-pay | Admitting: Gastroenterology

## 2010-11-02 MED ORDER — PANTOPRAZOLE SODIUM 40 MG PO TBEC: 40.0000 mg | DELAYED_RELEASE_TABLET | Freq: Every day | ORAL | Status: DC

## 2010-11-02 NOTE — Telephone Encounter (Signed)
Pt aware med sent 

## 2010-11-16 ENCOUNTER — Other Ambulatory Visit: Payer: Self-pay | Admitting: Gastroenterology

## 2011-01-10 ENCOUNTER — Telehealth: Payer: Self-pay | Admitting: Internal Medicine

## 2011-01-10 NOTE — Telephone Encounter (Signed)
Left vm for pt to callback 

## 2011-01-10 NOTE — Telephone Encounter (Signed)
Patient called and stated she is going to be traveling to [place] leaving January 27th, for 3 weeks she has gotten all of the recommended shot and 1 Rx for Cipro for one coarse and she is wondering if she can get another one just in cause she uses up the first and then becomes sick with traveling diaherra.

## 2011-01-10 NOTE — Telephone Encounter (Signed)
I do not usually send a patient with 2 courses - if it happened a second time- would recommend getting medical care where she is - would suspect antibiotic resistance at that point

## 2011-01-11 ENCOUNTER — Telehealth: Payer: Self-pay | Admitting: Family Medicine

## 2011-01-11 NOTE — Telephone Encounter (Signed)
See 01/10/11 note. Patient notified as instructed by telephone.

## 2011-01-11 NOTE — Telephone Encounter (Signed)
Patient notified as instructed by telephone. 

## 2011-01-11 NOTE — Telephone Encounter (Signed)
Patient's cell phone may be better way to reach her at [phone]

## 2011-01-11 NOTE — Telephone Encounter (Signed)
Returning call re: refill for Cipro.  Please call patient back

## 2011-04-05 ENCOUNTER — Encounter: Payer: Self-pay | Admitting: Gastroenterology

## 2011-04-11 ENCOUNTER — Encounter: Payer: Self-pay | Admitting: Gastroenterology

## 2011-05-15 ENCOUNTER — Encounter: Payer: Self-pay | Admitting: Gastroenterology

## 2011-05-15 ENCOUNTER — Ambulatory Visit (AMBULATORY_SURGERY_CENTER): Payer: BC Managed Care – PPO

## 2011-05-15 VITALS — Ht 70.0 in | Wt 168.1 lb

## 2011-05-15 DIAGNOSIS — Z860100 Personal history of colon polyps, unspecified: Secondary | ICD-10-CM

## 2011-05-15 MED ORDER — PEG-KCL-NACL-NASULF-NA ASC-C 100 G PO SOLR: 1.0000 | Freq: Once | ORAL | Status: DC

## 2011-05-29 ENCOUNTER — Encounter: Payer: Self-pay | Admitting: Gastroenterology

## 2011-05-29 ENCOUNTER — Telehealth: Payer: Self-pay | Admitting: Gastroenterology

## 2011-05-29 ENCOUNTER — Ambulatory Visit (AMBULATORY_SURGERY_CENTER): Payer: BC Managed Care – PPO | Admitting: Gastroenterology

## 2011-05-29 VITALS — BP 131/72 | HR 58 | Temp 96.7°F | Resp 18 | Ht 70.0 in | Wt 168.0 lb

## 2011-05-29 DIAGNOSIS — K635 Polyp of colon: Secondary | ICD-10-CM

## 2011-05-29 DIAGNOSIS — Z86010 Personal history of colonic polyps: Secondary | ICD-10-CM

## 2011-05-29 DIAGNOSIS — D126 Benign neoplasm of colon, unspecified: Secondary | ICD-10-CM

## 2011-05-29 MED ORDER — SODIUM CHLORIDE 0.9 % IV SOLN: 500.0000 mL | INTRAVENOUS | Status: DC

## 2011-05-29 NOTE — Telephone Encounter (Signed)
Pt states she has had 2 bland meals but has had a lot of heart burn and acid reflux since colon today. Pt states she has had 2 protonix and 2 prilosec and tums with little to no relief . Even if she drinks water she is having a lot of reflux. She states that with her last colon this exact thing happened. Pt denies pain, chest pain, shortness of breathe.  Pt instructed to try po maalox or mylanta because this is going to provide immediate relief where the prilosec or protonix could potentially take up to 24 hours and if she has a zantac she could try that as well with the same effect of the maalox. Continue bland meals and if s/s worsen call back. Pt verbalized understanding of all instructions. ewm

## 2011-05-29 NOTE — Progress Notes (Signed)
Patient did not experience any of the following events: a burn prior to discharge; a fall within the facility; wrong site/side/patient/procedure/implant event; or a hospital transfer or hospital admission upon discharge from the facility. (G8907) Patient did not have preoperative order for IV antibiotic SSI prophylaxis. (G8918)  

## 2011-05-29 NOTE — Patient Instructions (Signed)

## 2011-05-29 NOTE — Op Note (Signed)
[facility] [address] [place], [place]  [address]  COLONOSCOPY PROCEDURE REPORT  PATIENT:  Shannon, [name]  MR#:  [id number] BIRTHDATE:  Jun 06, 1950, 60 yrs. old  GENDER:  female ENDOSCOPIST:  Rachael Fee, MD PROCEDURE DATE:  05/29/2011 PROCEDURE:  Colonoscopy with snare polypectomy ASA CLASS:  Class II INDICATIONS:  adenomatous polyps 2008, 5 year recall MEDICATIONS:   Fentanyl 50 mcg IV, These medications were titrated to patient response per physician's verbal order, Versed 5 mg IV  DESCRIPTION OF PROCEDURE:   After the risks benefits and alternatives of the procedure were thoroughly explained, informed consent was obtained.  Digital rectal exam was performed and revealed no rectal masses.   The LB [id number] endoscope was introduced through the anus and advanced to the cecum, which was identified by both the appendix and ileocecal valve, without limitations.  The quality of the prep was good..  The instrument was then slowly withdrawn as the colon was fully examined. <<PROCEDUREIMAGES>> FINDINGS:  A sessile polyp was found in the descending colon. This was 4mm across, removed with cold snare, sent to pathology (jar 1) (see image3 and image4).  This was otherwise a normal examination of the colon (see image1, image2, and image5).   Retroflexed views in the rectum revealed no abnormalities. COMPLICATIONS:  None  ENDOSCOPIC IMPRESSION: 1) Sessile polyp in the descending colon, this was removed and sent to pathology 2) Otherwise normal examination  RECOMMENDATIONS: 1) Given your personal history of adenomatous (pre-cancerous) polyps, you will need a repeat colonoscopy in 5 years even if the polyp today is not precancerous. 2) You will receive a letter within 1-2 weeks with the results of your biopsy as well as final recommendations. Please call my office if you have not received a letter after 3 weeks.  ______________________________ Rachael Fee, MD  n. eSIGNED:   Rachael Fee at 05/29/2011 08:51 AM  [name], [id number]

## 2011-05-30 ENCOUNTER — Telehealth: Payer: Self-pay | Admitting: *Deleted

## 2011-05-30 NOTE — Telephone Encounter (Signed)
  Follow up Call-  Call back number 05/29/2011  Post procedure Call Back phone  # [phone]  Permission to leave phone message Yes    Spoke with Husband, unable to answer questions. States Shannon [name] was in a lot of pain last night and not able to eat much. However, she was at yoga class when I called. Advised husband to have Shannon [name] call us if she was still having pain or unable to eat.  Patient questions:  Do you have a fever, pain , or abdominal swelling? Pain Score  Have you tolerated food without any problems?   Have you been able to return to your normal activities?  Do you have any questions about your discharge instructions: Diet    Medications   Follow up visit    Do you have questions or concerns about your Care?   Actions:

## 2011-06-03 ENCOUNTER — Encounter: Payer: Self-pay | Admitting: Gastroenterology

## 2011-08-13 ENCOUNTER — Encounter: Payer: BC Managed Care – PPO | Admitting: Family Medicine

## 2011-09-19 ENCOUNTER — Other Ambulatory Visit: Payer: Self-pay | Admitting: Family Medicine

## 2011-09-19 DIAGNOSIS — Z1231 Encounter for screening mammogram for malignant neoplasm of breast: Secondary | ICD-10-CM

## 2011-10-12 ENCOUNTER — Other Ambulatory Visit: Payer: Self-pay | Admitting: Gastroenterology

## 2011-11-08 ENCOUNTER — Other Ambulatory Visit: Payer: Self-pay | Admitting: Gastroenterology

## 2011-11-10 ENCOUNTER — Telehealth: Payer: Self-pay | Admitting: Family Medicine

## 2011-11-10 DIAGNOSIS — Z0000 Encounter for general adult medical examination without abnormal findings: Secondary | ICD-10-CM

## 2011-11-10 NOTE — Telephone Encounter (Signed)
Message copied by Judy Pimple on Sun Nov 10, 2011  7:24 PM ------      Message from: Shannon [name]      Created: Fri Nov 08, 2011  8:15 AM      Regarding: Cpx labs Mon 10/21       Please order  future cpx labs for pt's upcomming lab appt.      Thanks      Rodney Booze

## 2011-11-11 ENCOUNTER — Other Ambulatory Visit (INDEPENDENT_AMBULATORY_CARE_PROVIDER_SITE_OTHER): Payer: BC Managed Care – PPO

## 2011-11-11 DIAGNOSIS — Z0000 Encounter for general adult medical examination without abnormal findings: Secondary | ICD-10-CM

## 2011-11-11 LAB — COMPREHENSIVE METABOLIC PANEL
AST: 25 U/L (ref 0–37)
Albumin: 3.4 g/dL — ABNORMAL LOW (ref 3.5–5.2)
Alkaline Phosphatase: 77 U/L (ref 39–117)
Potassium: 4.4 mEq/L (ref 3.5–5.1)
Sodium: 140 mEq/L (ref 135–145)
Total Protein: 6.5 g/dL (ref 6.0–8.3)

## 2011-11-11 LAB — CBC WITH DIFFERENTIAL/PLATELET
Eosinophils Absolute: 0.1 10*3/uL (ref 0.0–0.7)
MCHC: 32.7 g/dL (ref 30.0–36.0)
MCV: 90.8 fl (ref 78.0–100.0)
Monocytes Absolute: 0.4 10*3/uL (ref 0.1–1.0)
Neutrophils Relative %: 42.6 % — ABNORMAL LOW (ref 43.0–77.0)
Platelets: 337 10*3/uL (ref 150.0–400.0)
RDW: 13.9 % (ref 11.5–14.6)

## 2011-11-11 LAB — LIPID PANEL
LDL Cholesterol: 93 mg/dL (ref 0–99)
Total CHOL/HDL Ratio: 3
Triglycerides: 46 mg/dL (ref 0.0–149.0)
VLDL: 9.2 mg/dL (ref 0.0–40.0)

## 2011-11-13 ENCOUNTER — Ambulatory Visit (INDEPENDENT_AMBULATORY_CARE_PROVIDER_SITE_OTHER): Payer: BC Managed Care – PPO | Admitting: Family Medicine

## 2011-11-13 ENCOUNTER — Encounter: Payer: Self-pay | Admitting: Family Medicine

## 2011-11-13 ENCOUNTER — Other Ambulatory Visit (HOSPITAL_COMMUNITY)
Admission: RE | Admit: 2011-11-13 | Discharge: 2011-11-13 | Disposition: A | Discharge status: Home / Self Care | Payer: BC Managed Care – PPO | Source: Ambulatory Visit | Attending: Family Medicine | Admitting: Family Medicine

## 2011-11-13 VITALS — BP 118/70 | HR 64 | Temp 98.3°F | Ht 69.0 in | Wt 165.8 lb

## 2011-11-13 DIAGNOSIS — Z01419 Encounter for gynecological examination (general) (routine) without abnormal findings: Secondary | ICD-10-CM | POA: Insufficient documentation

## 2011-11-13 DIAGNOSIS — Z1151 Encounter for screening for human papillomavirus (HPV): Secondary | ICD-10-CM | POA: Insufficient documentation

## 2011-11-13 DIAGNOSIS — Z1231 Encounter for screening mammogram for malignant neoplasm of breast: Secondary | ICD-10-CM | POA: Insufficient documentation

## 2011-11-13 DIAGNOSIS — Z0000 Encounter for general adult medical examination without abnormal findings: Secondary | ICD-10-CM

## 2011-11-13 NOTE — Progress Notes (Signed)
Subjective:    Patient ID: Shannon [name], female    DOB: 06-15-1950, 61 y.o.   MRN: [id number]  HPI Here for health maintenance exam and to review chronic medical problems    Is feeling fine overall   Is having intermittent pain in R hip - in her L groin  Hurts to twist Worse after hiking on a trail  Comes and goes  Not ready to work it up right now - will come back if worse   Wt is down 3 lb with bmi of 24 Is eating enough - tries to watch her weight  Mostly walking for exercise   Pap 6/10 She does not see gyn  Needs pap  No gyn problems    mammo 10/13 (not in chart)- was in aug or sept -normal  Self exam no lumps or changes   colonosc 5/13- 5 year follow up , hx of polyps   imms up to date -had her flu shot this season     Chemistry      Component Value Date/Time   NA 140 11/11/2011 0838   K 4.4 11/11/2011 0838   CL 104 11/11/2011 0838   CO2 29 11/11/2011 0838   BUN 16 11/11/2011 0838   CREATININE 0.7 11/11/2011 0838      Component Value Date/Time   CALCIUM 9.1 11/11/2011 0838   ALKPHOS 77 11/11/2011 0838   AST 25 11/11/2011 0838   ALT 25 11/11/2011 0838   BILITOT 1.0 11/11/2011 0838      Lab Results  Component Value Date   CHOL 159 11/11/2011   CHOL 177 07/27/2010   CHOL 168 07/13/2009   Lab Results  Component Value Date   HDL 57.10 11/11/2011   HDL 69.50 07/27/2010   HDL 65.30 07/13/2009   Lab Results  Component Value Date   LDLCALC 93 11/11/2011   LDLCALC 98 07/27/2010   LDLCALC 93 07/13/2009   Lab Results  Component Value Date   TRIG 46.0 11/11/2011   TRIG 50.0 07/27/2010   TRIG 47.0 07/13/2009   Lab Results  Component Value Date   CHOLHDL 3 11/11/2011   CHOLHDL 3 07/27/2010   CHOLHDL 3 07/13/2009   No results found for this basename: LDLDIRECT    Good cholesterol profile ! Is a healthy eater Eats fish and vegetables   Patient Active Problem List  Diagnosis  . TUBULOVILLOUS ADENOMA, COLON  . NEOPLASM, SKIN, UNCERTAIN BEHAVIOR  .  ESOPHAGITIS, MILD  . GERD  . PAIN IN JOINT, SHOULDER  . PLANTAR FASCIITIS, LEFT  . PERSONAL HX COLONIC POLYPS  . Routine general medical examination at a health care facility  . Other screening mammogram   Past Medical History  Diagnosis Date  . GERD (gastroesophageal reflux disease)     with esophagitis  . Plantar fasciitis     left   Past Surgical History  Procedure Date  . Tubal ligation 1987   History  Substance Use Topics  . Smoking status: Never Smoker   . Smokeless tobacco: Not on file  . Alcohol Use: No   Family History  Problem Relation Age of Onset  . Stroke Mother     x 2  . Heart disease Mother     congenital arrhythmia and CHF  . Depression Mother   . Cancer Brother     prostate   Allergies  Allergen Reactions  . Neomycin-Bacitracin Zn-Polymyx     REACTION: rash   Current Outpatient Prescriptions on File Prior to Visit  Medication Sig Dispense Refill  . Calcium Carbonate-Vit D-Min 600-400 MG-UNIT TABS Take 1 tablet by mouth daily.        . Glucosamine-Chondroitin-MSM 500-250-250 MG CAPS Take 1 capsule by mouth 2 (two) times daily.       . Melatonin 5 MG CAPS Take 1 capsule by mouth at bedtime.        . Multiple Vitamin (MULTIVITAMIN) tablet Take 1 tablet by mouth daily.        . Omega-3 Fatty Acids (FISH OIL PO) Take 1 capsule by mouth 2 (two) times daily.        . pantoprazole (PROTONIX) 40 MG tablet TAKE 1 TABLET BY MOUTH ONCE DAILY (BEST TAKE 20-30 MIN PRIOR TO BREAKFAST)  30 tablet  11  . TRYPTOPHAN PO Take 2 tablets by mouth as needed.       Marland Kitchen DISCONTD: pantoprazole (PROTONIX) 40 MG tablet TAKE 1 TABLET (40 MG TOTAL) BY MOUTH DAILY. BEST TAKEN 20-30 MIN PRIORTO BREAKFAST MEAL.  30 tablet  10  . DISCONTD: pantoprazole (PROTONIX) 40 MG tablet TAKE 1 TABLET (40 MG TOTAL) BY MOUTH DAILY. BEST TAKEN 20-30 MIN PRIORTO BREAKFAST MEAL.  30 tablet  10     Review of Systems Review of Systems  Constitutional: Negative for fever, appetite change, fatigue  and unexpected weight change.  Eyes: Negative for pain and visual disturbance.  Respiratory: Negative for cough and shortness of breath.   Cardiovascular: Negative for cp or palpitations    Gastrointestinal: Negative for nausea, diarrhea and constipation.  Genitourinary: Negative for urgency and frequency.  Skin: Negative for pallor or rash   Neurological: Negative for weakness, light-headedness, numbness and headaches.  Hematological: Negative for adenopathy. Does not bruise/bleed easily.  Psychiatric/Behavioral: Negative for dysphoric mood. The patient is not nervous/anxious.         Objective:   Physical Exam  Constitutional: She appears well-developed and well-nourished. No distress.  HENT:  Head: Normocephalic and atraumatic.  Right Ear: External ear normal.  Left Ear: External ear normal.  Mouth/Throat: Oropharynx is clear and moist.  Eyes: Conjunctivae normal and EOM are normal. Pupils are equal, round, and reactive to light. No scleral icterus.  Neck: Normal range of motion. Neck supple. No JVD present. Carotid bruit is not present. No thyromegaly present.  Cardiovascular: Normal rate, regular rhythm, normal heart sounds and intact distal pulses.  Exam reveals no gallop.   Pulmonary/Chest: Breath sounds normal. No respiratory distress. She has no wheezes.  Abdominal: Soft. Bowel sounds are normal. She exhibits no distension, no abdominal bruit and no mass. There is no tenderness.  Genitourinary: Vagina normal. No breast swelling, tenderness, discharge or bleeding. There is no rash, tenderness or lesion on the right labia. There is no rash, tenderness or lesion on the left labia. Uterus is not enlarged and not tender. Cervix exhibits no motion tenderness, no discharge and no friability. Right adnexum displays no mass, no tenderness and no fullness. Left adnexum displays no mass, no tenderness and no fullness.       Breast exam: No mass, nodules, thickening, tenderness, bulging,  retraction, inflamation, nipple discharge or skin changes noted.  No axillary or clavicular LA.  Chaperoned exam.    Musculoskeletal: Normal range of motion. She exhibits no edema and no tenderness.  Lymphadenopathy:    She has no cervical adenopathy.  Neurological: She is alert. She has normal reflexes. No cranial nerve deficit. She exhibits normal muscle tone. Coordination normal.  Skin: Skin is warm and dry. No rash  noted. No erythema. No pallor.  Psychiatric: She has a normal mood and affect.          Assessment & Plan:

## 2011-11-13 NOTE — Assessment & Plan Note (Signed)
Breast exam done Pt states mam was in sept but no result in chart She will check on that  Enc monthly self exams

## 2011-11-13 NOTE — Assessment & Plan Note (Signed)
3 year exam with pap No problems  

## 2011-11-13 NOTE — Assessment & Plan Note (Signed)
Reviewed health habits including diet and exercise and skin cancer prevention Also reviewed health mt list, fam hx and immunizations  Gyn exam done today  utd imms  [place] health habits utd on colonosc

## 2011-11-13 NOTE — Patient Instructions (Addendum)
Labs look good Keep up the healthy habits Pap and gyn exam was done today

## 2011-11-19 ENCOUNTER — Encounter: Payer: Self-pay | Admitting: *Deleted

## 2011-11-19 ENCOUNTER — Ambulatory Visit
Admission: RE | Admit: 2011-11-19 | Discharge: 2011-11-19 | Disposition: A | Discharge status: Home / Self Care | Payer: BC Managed Care – PPO | Source: Ambulatory Visit | Attending: Family Medicine | Admitting: Family Medicine

## 2011-11-19 DIAGNOSIS — Z1231 Encounter for screening mammogram for malignant neoplasm of breast: Secondary | ICD-10-CM

## 2011-11-25 ENCOUNTER — Encounter: Payer: Self-pay | Admitting: *Deleted

## 2011-12-04 ENCOUNTER — Encounter: Payer: Self-pay | Admitting: Family Medicine

## 2011-12-04 ENCOUNTER — Ambulatory Visit (INDEPENDENT_AMBULATORY_CARE_PROVIDER_SITE_OTHER)
Admission: RE | Admit: 2011-12-04 | Discharge: 2011-12-04 | Disposition: A | Discharge status: Home / Self Care | Payer: BC Managed Care – PPO | Source: Ambulatory Visit | Attending: Family Medicine | Admitting: Family Medicine

## 2011-12-04 ENCOUNTER — Ambulatory Visit (INDEPENDENT_AMBULATORY_CARE_PROVIDER_SITE_OTHER): Payer: BC Managed Care – PPO | Admitting: Family Medicine

## 2011-12-04 VITALS — BP 110/60 | HR 68 | Temp 98.4°F | Ht 70.0 in | Wt 168.0 lb

## 2011-12-04 DIAGNOSIS — M25559 Pain in unspecified hip: Secondary | ICD-10-CM

## 2011-12-04 DIAGNOSIS — M25552 Pain in left hip: Secondary | ICD-10-CM | POA: Insufficient documentation

## 2011-12-04 NOTE — Progress Notes (Signed)
Subjective:    Patient ID: Shannon [name], female    DOB: [birth year], 61 y.o.   MRN: [id number]  HPI Has intermittent pain in L hip Varies with activity (the more she walks during the day the more it bothers her at  Night) Wakes her up at night in bed  Was worst this summer - after getting up the wrong way- rotated it - sharp and very painful for 48 hours  occ takes naproxen- not often ? If it helps  Pain is in groin and deep in buttock for the most part  Does rad to the thigh    Patient Active Problem List  Diagnosis  . TUBULOVILLOUS ADENOMA, COLON  . NEOPLASM, SKIN, UNCERTAIN BEHAVIOR  . ESOPHAGITIS, MILD  . GERD  . PLANTAR FASCIITIS, LEFT  . PERSONAL HX COLONIC POLYPS  . Routine general medical examination at a health care facility  . Other screening mammogram  . Routine gynecological examination   Past Medical History  Diagnosis Date  . GERD (gastroesophageal reflux disease)     with esophagitis  . Plantar fasciitis     left   Past Surgical History  Procedure Date  . Tubal ligation 1987   History  Substance Use Topics  . Smoking status: Never Smoker   . Smokeless tobacco: Not on file  . Alcohol Use: No   Family History  Problem Relation Age of Onset  . Stroke Mother     x 2  . Heart disease Mother     congenital arrhythmia and CHF  . Depression Mother   . Cancer Brother     prostate   Allergies  Allergen Reactions  . Neomycin-Bacitracin Zn-Polymyx     REACTION: rash   Current Outpatient Prescriptions on File Prior to Visit  Medication Sig Dispense Refill  . Calcium Carbonate-Vit D-Min 600-400 MG-UNIT TABS Take 1 tablet by mouth daily.        . Glucosamine-Chondroitin-MSM 500-250-250 MG CAPS Take 1 capsule by mouth 2 (two) times daily.       . Melatonin 5 MG CAPS Take 1 capsule by mouth at bedtime.        . Multiple Vitamin (MULTIVITAMIN) tablet Take 1 tablet by mouth daily.        . Omega-3 Fatty Acids (FISH OIL PO) Take 1 capsule by mouth 2 (two)  times daily.        . pantoprazole (PROTONIX) 40 MG tablet TAKE 1 TABLET BY MOUTH ONCE DAILY (BEST TAKE 20-30 MIN PRIOR TO BREAKFAST)  30 tablet  11  . TRYPTOPHAN PO Take 2 tablets by mouth as needed.           Review of Systems Review of Systems  Constitutional: Negative for fever, appetite change, fatigue and unexpected weight change.  Eyes: Negative for pain and visual disturbance.  Respiratory: Negative for cough and shortness of breath.   Cardiovascular: Negative for cp or palpitations    Gastrointestinal: Negative for nausea, diarrhea and constipation.  Genitourinary: Negative for urgency and frequency.  Skin: Negative for pallor or rash   MSK pos for L groin/ hip pain, neg for back pain or buttock pain or any joint swelling Neurological: Negative for weakness, light-headedness, numbness and headaches.  Hematological: Negative for adenopathy. Does not bruise/bleed easily.  Psychiatric/Behavioral: Negative for dysphoric mood. The patient is not nervous/anxious.         Objective:   Physical Exam  Constitutional: She appears well-developed and well-nourished. No distress.  HENT:  Head: Normocephalic  and atraumatic.  Neck: Normal range of motion. Neck supple.  Cardiovascular: Normal rate and regular rhythm.   Musculoskeletal: She exhibits tenderness. She exhibits no edema.       Left hip: She exhibits decreased range of motion and tenderness. She exhibits normal strength, no bony tenderness, no swelling, no crepitus, no deformity and no laceration.       Pain on internal and external rotation of hip - limited due to pain in groin area  No troch tenderness No LS tenderness Nl flex of hip and nl gait  Nl rom knee    Neurological: She is alert. She has normal strength and normal reflexes. She displays no atrophy.  Skin: Skin is warm and dry. No rash noted. No erythema. No pallor.  Psychiatric: She has a normal mood and affect.          Assessment & Plan:

## 2011-12-04 NOTE — Patient Instructions (Addendum)
Xray today  Use warm compress on the area of pain as needed  Naproxen or ibuprofen is ok with food as needed as long as it does not bother your stomach Tylenol is ok also  Stay active but try to avoid high impact activities / or things that make it flare

## 2011-12-05 NOTE — Assessment & Plan Note (Signed)
Suspect hip OA given pain in groin area with rotation of hip and no other areas of pain or tenderness  Xray today  Recommend nsaid prn if needed and warm compress

## 2012-07-20 ENCOUNTER — Telehealth: Payer: Self-pay | Admitting: Family Medicine

## 2012-07-20 ENCOUNTER — Emergency Department: Payer: Self-pay | Admitting: Emergency Medicine

## 2012-07-20 LAB — COMPREHENSIVE METABOLIC PANEL
Albumin: 3.7 g/dL (ref 3.4–5.0)
Calcium, Total: 9.3 mg/dL (ref 8.5–10.1)
Creatinine: 1.06 mg/dL (ref 0.60–1.30)
EGFR (African American): >60
EGFR (Non-African Amer.): 56 — ABNORMAL LOW
Osmolality: 286 (ref 275–301)
Sodium: 140 mmol/L (ref 136–145)
Total Protein: 6.9 g/dL (ref 6.4–8.2)

## 2012-07-20 LAB — URINALYSIS, COMPLETE
Bacteria: NONE SEEN
Bilirubin,UR: NEGATIVE
Nitrite: NEGATIVE
Ph: 7 (ref 4.5–8.0)
RBC,UR: 42 /HPF (ref 0–5)
Specific Gravity: 1.036 (ref 1.003–1.030)
WBC UR: 3 /HPF (ref 0–5)

## 2012-07-20 LAB — CBC
HCT: 37.9 % (ref 35.0–47.0)
MCH: 30.7 pg (ref 26.0–34.0)
MCHC: 35.4 g/dL (ref 32.0–36.0)

## 2012-07-20 NOTE — Telephone Encounter (Signed)
Thanks - please alert me if she does not pass the stone

## 2012-07-20 NOTE — Telephone Encounter (Signed)
I agree with advisement to go to ER Please call and check in with her at the end of the day, thanks

## 2012-07-20 NOTE — Telephone Encounter (Signed)
Pt went to ER, they did an CT and found a kidney stone that they think will pass on its own but gave her pain med until it does and advised her to f/u with her GI doctor regarding  the blood in stool, pt just got home from hospital so she will call and schedule a f/u with GI tomorrow

## 2012-07-20 NOTE — Telephone Encounter (Signed)
Patient Information:  Caller Name: Timmy  Phone: [phone]  Patient: Shannon [name], Shannon [name]  Gender: Female  DOB: 09-Jul-1950  Age: 62 Years  PCP: Roxy Manns [facility]  Office Follow Up:  Does the office need to follow up with this patient?: No  Instructions For The Office: N/A  RN Note:  Pt started vomiting at 1am on 6-30, diarrhea has blood and mucus/pus mixed in.  Pt is unable to hold down fluids w/ constant abdominal pain. Advised Pt to go to ED due to possible dehydration, dark urine of only small amount since 1 am and constant abdominal pain that doesn't improve w/ vomiting or diarrhea.  Pt verbalized understanding.  Symptoms  Reason For Call & Symptoms: ER CALL.  Vomiting w/ Diarrhea, blood and mucus/pus in stool  Reviewed Health History In EMR: N/A  Reviewed Medications In EMR: N/A  Reviewed Allergies In EMR: N/A  Reviewed Surgeries / Procedures: N/A  Date of Onset of Symptoms: 07/20/2012  Guideline(s) Used:  Vomiting  Disposition Per Guideline:   Go to ED Now  Reason For Disposition Reached:   Severe vomiting (e.g., 6 or more times/day)  Advice Given:  N/A  Patient Will Follow Care Advice:  YES

## 2012-07-21 ENCOUNTER — Telehealth: Payer: Self-pay | Admitting: Gastroenterology

## 2012-07-21 NOTE — Telephone Encounter (Signed)
Pt has not seen any blood in her stool for several days and has no other symptoms.  Appt for 09/07/12 to discuss.  She will call back if any symptoms return.  She was offered an earlier appt but declined because she will be out of town

## 2012-09-06 ENCOUNTER — Other Ambulatory Visit: Payer: Self-pay | Admitting: Gastroenterology

## 2012-09-07 ENCOUNTER — Encounter: Payer: Self-pay | Admitting: Gastroenterology

## 2012-09-07 ENCOUNTER — Ambulatory Visit (INDEPENDENT_AMBULATORY_CARE_PROVIDER_SITE_OTHER): Payer: BC Managed Care – PPO | Admitting: Gastroenterology

## 2012-09-07 VITALS — BP 100/60 | HR 72 | Ht 70.0 in | Wt 158.4 lb

## 2012-09-07 DIAGNOSIS — R1013 Epigastric pain: Secondary | ICD-10-CM

## 2012-09-07 DIAGNOSIS — K3189 Other diseases of stomach and duodenum: Secondary | ICD-10-CM

## 2012-09-07 DIAGNOSIS — K625 Hemorrhage of anus and rectum: Secondary | ICD-10-CM

## 2012-09-07 NOTE — Patient Instructions (Addendum)
If you see blood in your stool again, please call. You should change the way you are taking your antiacid medicine (protonix) so that you are taking it 20-30 minutes prior to a decent meal as that is the way the pill is designed to work most effectively.  If you are going to take a second PPI, take it 20-30 min prior to dinner meal. You will be set up for an upper endoscopy for your GERD, dyspepsia (LEC, moderate sedation).                                               We are excited to introduce MyChart, a new best-in-class service that provides you online access to important information in your electronic medical record. We want to make it easier for you to view your health information - all in one secure location - when and where you need it. We expect MyChart will enhance the quality of care and service we provide.  When you register for MyChart, you can:    View your test results.    Request appointments and receive appointment reminders via email.    Request medication renewals.    View your medical history, allergies, medications and immunizations.    Communicate with your physician's office through a password-protected site.    Conveniently print information such as your medication lists.  To find out if MyChart is right for you, please talk to a member of our clinical staff today. We will gladly answer your questions about this free health and wellness tool.  If you are age 60 or older and want a member of your family to have access to your record, you must provide written consent by completing a proxy form available at our office. Please speak to our clinical staff about guidelines regarding accounts for patients younger than age 40.  As you activate your MyChart account and need any technical assistance, please call the MyChart technical support line at [phone]) or email your question to [email or web address]. If you email your question(s), please include  your name, a return phone number and the best time to reach you.  If you have non-urgent health-related questions, you can send a message to our office through MyChart at [email or web address]. If you have a medical emergency, call 911.  Thank you for using MyChart as your new health and wellness resource!   MyChart licensed from [employer],  4540-9811. Patents Pending.

## 2012-09-07 NOTE — Progress Notes (Signed)
Review of pertinent gastrointestinal problems: 1. Mild esophagitis seen May 2008, EGD by me. Biopsy showed no Barrett's. Fundic gland polyps also biopsied showed no neoplasia. November, 2011: GERD symptoms under good control on protonix. once daily  2. Colonic tubular adenomas May 2008. Next colonoscopy May 2013.   Repeat colonoscopy Christella Hartigan) 05/2011 found small TA; recommended recall examination in 5 years.  HPI: This is a  very pleasant 62 year old woman whom I last saw in the office on the left than 3 years ago.  Had kidney stone; vomiting, diarrhea, abd pains.  Acute diarrhea and vomiting illness; still has spasms.  Started about 1am lasted about 12 hours.  This was 6 weeks ago.  A CT scan showed kidney stone.  ?? Relation.  Takes protonix one every Am, eats 30-60 min afterwards.  Has had to add prilosec in evening.  OTC.  Has lost 10 pounds, intentionally.  No caffeine. Rare NSAIDs.  No dysphagia.  Spicey, acidic foods cause symptoms.  Thinks she is mildly lactose intolerant.  Not worse than usual.   Past Medical History  Diagnosis Date  . GERD (gastroesophageal reflux disease)     with esophagitis  . Plantar fasciitis     left    Past Surgical History  Procedure Laterality Date  . Tubal ligation  1987    Current Outpatient Prescriptions  Medication Sig Dispense Refill  . omeprazole (PRILOSEC) 20 MG capsule Take 20 mg by mouth daily.      . ST JOHNS WORT PO Take by mouth daily.      . Glucosamine-Chondroitin-MSM 500-250-250 MG CAPS Take 1 capsule by mouth 2 (two) times daily.       . Melatonin 5 MG CAPS Take 1 capsule by mouth at bedtime.        . Multiple Vitamin (MULTIVITAMIN) tablet Take 1 tablet by mouth daily.        . Omega-3 Fatty Acids (FISH OIL PO) Take 1 capsule by mouth 2 (two) times daily.        . pantoprazole (PROTONIX) 40 MG tablet TAKE 1 TABLET BY MOUTH ONCE DAILY (BEST TAKE 20-30 MIN PRIOR TO BREAKFAST)  30 tablet  11   No current facility-administered  medications for this visit.    Allergies as of 09/07/2012 - Review Complete 09/07/2012  Allergen Reaction Noted  . Neomycin-bacitracin zn-polymyx  10/20/2006    Family History  Problem Relation Age of Onset  . Stroke Mother     x 2  . Heart disease Mother     congenital arrhythmia and CHF  . Depression Mother   . Prostate cancer Brother     History   Social History  . Marital Status: Married    Spouse Name: N/A    Number of Children: N/A  . Years of Education: N/A   Occupational History  . Not on file.   Social History Main Topics  . Smoking status: Never Smoker   . Smokeless tobacco: Never Used  . Alcohol Use: No  . Drug Use: No  . Sexual Activity: Not on file   Other Topics Concern  . Not on file   Social History Narrative  . No narrative on file      Physical Exam: BP 100/60  Pulse 72  Ht 5\' 10"  (1.778 m)  Wt 158 lb 6 oz (71.838 kg)  BMI 22.72 kg/m2 Constitutional: generally well-appearing Psychiatric: alert and oriented x3 Abdomen: soft, nontender, nondistended, no obvious ascites, no peritoneal signs, normal bowel sounds  Assessment and plan: 62 y.o. female with recent diarrheal, vomiting illness with minor rectal bleeding that has resolved; chronic dyspepsia, GERD  First her diarrheal, vomiting illness 6 weeks ago seems like it was infection related. She saw some minor rectal bleeding after several diarrheal stools and I suspect it was from anal trauma. She has not seen blood since then. I reassured her that since she had a colonoscopy about a year ago we do not need further evaluation at this point. She does know to call if she has recurrent rectal bleeding however. She is more bothered by intermittent upper GI symptoms dyspepsia, GERD-like symptoms. Proton pump inhibitor used to help this once daily. She is not taking it at the exact right timing in the morning and I advised her about that. She will also start intermittently taking proton pump  inhibitor shortly before her dinner meal. I would like to proceed with EGD at her soonest convenience. She has lost 10 pounds, mainly intentionally but that does raise my concern about underlying significant issues. Perhaps she has H. pylori.

## 2012-09-15 ENCOUNTER — Encounter: Payer: Self-pay | Admitting: Gastroenterology

## 2012-09-15 ENCOUNTER — Ambulatory Visit (AMBULATORY_SURGERY_CENTER): Payer: BC Managed Care – PPO | Admitting: Gastroenterology

## 2012-09-15 VITALS — BP 114/68 | HR 62 | Temp 96.8°F | Resp 18 | Ht 70.0 in | Wt 158.0 lb

## 2012-09-15 DIAGNOSIS — K2990 Gastroduodenitis, unspecified, without bleeding: Secondary | ICD-10-CM

## 2012-09-15 DIAGNOSIS — D131 Benign neoplasm of stomach: Secondary | ICD-10-CM

## 2012-09-15 DIAGNOSIS — K3189 Other diseases of stomach and duodenum: Secondary | ICD-10-CM

## 2012-09-15 DIAGNOSIS — K2970 Gastritis, unspecified, without bleeding: Secondary | ICD-10-CM

## 2012-09-15 DIAGNOSIS — K317 Polyp of stomach and duodenum: Secondary | ICD-10-CM

## 2012-09-15 MED ORDER — SODIUM CHLORIDE 0.9 % IV SOLN: 500.0000 mL | INTRAVENOUS | Status: DC

## 2012-09-15 MED ORDER — PANTOPRAZOLE SODIUM 40 MG PO TBEC: 40.0000 mg | DELAYED_RELEASE_TABLET | Freq: Two times a day (BID) | ORAL | Status: DC

## 2012-09-15 NOTE — Op Note (Signed)
[facility] [address] [place], [address]   ENDOSCOPY PROCEDURE REPORT  PATIENT: Shannon [name], Shannon [name]  MR#: [id number] BIRTHDATE: 1950/05/02 , 62  yrs. old GENDER: Female ENDOSCOPIST: Rachael Fee, MD PROCEDURE DATE:  09/15/2012 PROCEDURE:  EGD w/ biopsy ASA CLASS:     Class II INDICATIONS:  Mild esophagitis seen May 2008, EGD by me.  Biopsy showed no Barrett's.  Fundic gland polyps also biopsied showed no neoplasia.  November, 2011: GERD symptoms under good control on protonix.  once daily.  Recent worsening dyspepsia, weight loss MEDICATIONS: Fentanyl 50 mcg IV, Versed 7 mg IV, and These medications were titrated to patient response per physician's verbal order TOPICAL ANESTHETIC: Cetacaine Spray  DESCRIPTION OF PROCEDURE: After the risks benefits and alternatives of the procedure were thoroughly explained, informed consent was obtained.  The LB [id number] endoscope was introduced through the mouth and advanced to the second portion of the duodenum. Without limitations.  The instrument was slowly withdrawn as the mucosa was fully examined.    There were numerous fleshy polyps in proximal stomach (2-8mm in size), sampled with biopsy.  The distal stomach was mildly inflamed and was biopsied.  The examination was otherwise normal. Retroflexed views revealed no abnormalities.     The scope was then withdrawn from the patient and the procedure completed. COMPLICATIONS: There were no complications.  ENDOSCOPIC IMPRESSION: There were numerous fleshy polyps in proximal stomach (2-76mm in size), sampled with biopsy.  The distal stomach was mildly inflamed and was biopsied.  The examination was otherwise normal.  RECOMMENDATIONS: Await final pathology.  You should continue taking your antiacid medicine as discussed in office, it seems like this regimin is helping.   eSigned:  Rachael Fee, MD 09/15/2012 1:48 PM

## 2012-09-15 NOTE — Patient Instructions (Addendum)

## 2012-09-15 NOTE — Progress Notes (Signed)
Patient did not experience any of the following events: a burn prior to discharge; a fall within the facility; wrong site/side/patient/procedure/implant event; or a hospital transfer or hospital admission upon discharge from the facility. (G8907)Patient did not have preoperative order for IV antibiotic SSI prophylaxis. (G8918) ewm 

## 2012-09-16 ENCOUNTER — Telehealth: Payer: Self-pay | Admitting: *Deleted

## 2012-09-16 NOTE — Telephone Encounter (Signed)
No answer, message left for the patient. 

## 2012-09-18 ENCOUNTER — Telehealth: Payer: Self-pay | Admitting: Gastroenterology

## 2012-09-18 MED ORDER — PANTOPRAZOLE SODIUM 40 MG PO TBEC: 40.0000 mg | DELAYED_RELEASE_TABLET | Freq: Two times a day (BID) | ORAL | Status: DC

## 2012-09-18 NOTE — Telephone Encounter (Signed)
rx has been resent 

## 2012-09-18 NOTE — Telephone Encounter (Signed)
Pt is aware that BID dosing was been sent to the pharmacy the day she was in the office

## 2012-09-23 ENCOUNTER — Telehealth: Payer: Self-pay | Admitting: Gastroenterology

## 2012-09-23 NOTE — Telephone Encounter (Signed)
Pt notified Dr Christella Hartigan has not reviewed the pathology as of today, I will call her as soon as available

## 2012-10-12 ENCOUNTER — Other Ambulatory Visit: Payer: Self-pay

## 2012-10-12 DIAGNOSIS — Z1231 Encounter for screening mammogram for malignant neoplasm of breast: Secondary | ICD-10-CM

## 2012-11-05 ENCOUNTER — Telehealth: Payer: Self-pay | Admitting: Family Medicine

## 2012-11-05 DIAGNOSIS — Z0000 Encounter for general adult medical examination without abnormal findings: Secondary | ICD-10-CM | POA: Insufficient documentation

## 2012-11-05 NOTE — Telephone Encounter (Signed)
Message copied by Judy Pimple on Thu Nov 05, 2012  4:44 PM ------      Message from: Alvina Chou      Created: Wed Oct 28, 2012  5:31 PM      Regarding: Lab orders for Friday, 10.17.14       Patient is scheduled for CPX labs, please order future labs, Thanks , Terri       ------

## 2012-11-06 ENCOUNTER — Other Ambulatory Visit (INDEPENDENT_AMBULATORY_CARE_PROVIDER_SITE_OTHER): Payer: BC Managed Care – PPO

## 2012-11-06 DIAGNOSIS — Z0000 Encounter for general adult medical examination without abnormal findings: Secondary | ICD-10-CM

## 2012-11-06 LAB — LIPID PANEL
HDL: 72.9 mg/dL (ref >39.00)
Total CHOL/HDL Ratio: 3
VLDL: 9.8 mg/dL (ref 0.0–40.0)

## 2012-11-06 LAB — CBC WITH DIFFERENTIAL/PLATELET
Basophils Absolute: 0 10*3/uL (ref 0.0–0.1)
Eosinophils Absolute: 0.1 10*3/uL (ref 0.0–0.7)
HCT: 40.3 % (ref 36.0–46.0)
Hemoglobin: 13.4 g/dL (ref 12.0–15.0)
Lymphs Abs: 2 10*3/uL (ref 0.7–4.0)
MCHC: 33.2 g/dL (ref 30.0–36.0)
Neutro Abs: 2.6 10*3/uL (ref 1.4–7.7)
RDW: 13.3 % (ref 11.5–14.6)

## 2012-11-06 LAB — COMPREHENSIVE METABOLIC PANEL
ALT: 20 U/L (ref 0–35)
AST: 24 U/L (ref 0–37)
Creatinine, Ser: 0.8 mg/dL (ref 0.4–1.2)
Total Bilirubin: 0.8 mg/dL (ref 0.3–1.2)

## 2012-11-06 LAB — TSH: TSH: 1.76 u[IU]/mL (ref 0.35–5.50)

## 2012-11-13 ENCOUNTER — Ambulatory Visit (INDEPENDENT_AMBULATORY_CARE_PROVIDER_SITE_OTHER): Payer: BC Managed Care – PPO | Admitting: Family Medicine

## 2012-11-13 ENCOUNTER — Encounter: Payer: Self-pay | Admitting: Family Medicine

## 2012-11-13 VITALS — BP 108/64 | HR 66 | Temp 98.3°F | Ht 69.25 in | Wt 155.8 lb

## 2012-11-13 DIAGNOSIS — Z86010 Personal history of colonic polyps: Secondary | ICD-10-CM

## 2012-11-13 DIAGNOSIS — Z0000 Encounter for general adult medical examination without abnormal findings: Secondary | ICD-10-CM

## 2012-11-13 NOTE — Progress Notes (Signed)
Subjective:    Patient ID: Shannon [name], female    DOB: [birth year], 62 y.o.   MRN: [id number]  HPI Here for health maintenance exam and to review chronic medical problems    Doing fine and no new problems   Wt is down 3 lb  bmi is 22   Had EGD with Dr Christella Hartigan - after having more GI symptoms -- saw inflammtions Increased protonix to bid  Doing better with that   10/13 mammogram-has one scheduled No breast lumps on self exam  Flu shot this month   colonosscopy 5/13 - up to date   Td 1/ 12 -up tod ate  Zoster 8/12- also up to date   Pap 10/13 -normal   No gyn problems   Sugar 109 - will watch this - she has a sweet tooth but controls this fairly well  Eats healthy diet   Lab Results  Component Value Date   CHOL 191 11/06/2012   CHOL 159 11/11/2011   CHOL 177 07/27/2010   Lab Results  Component Value Date   HDL 72.90 11/06/2012   HDL 57.10 11/11/2011   HDL 69.50 07/27/2010   Lab Results  Component Value Date   LDLCALC 108* 11/06/2012   LDLCALC 93 11/11/2011   LDLCALC 98 07/27/2010   Lab Results  Component Value Date   TRIG 49.0 11/06/2012   TRIG 46.0 11/11/2011   TRIG 50.0 07/27/2010   Lab Results  Component Value Date   CHOLHDL 3 11/06/2012   CHOLHDL 3 11/11/2011   CHOLHDL 3 07/27/2010   No results found for this basename: LDLDIRECT   overall stable with high HDL  Good diet and exercise   Patient Active Problem List   Diagnosis Date Noted  . Routine general medical examination at a health care facility 11/05/2012  . Left hip pain 12/04/2011  . Other screening mammogram 11/13/2011  . PLANTAR FASCIITIS, LEFT 07/28/2009  . NEOPLASM, SKIN, UNCERTAIN BEHAVIOR 07/06/2008  . PERSONAL HX COLONIC POLYPS 11/02/2007  . ESOPHAGITIS, MILD 03/31/2007  . GERD 10/20/2006   Past Medical History  Diagnosis Date  . GERD (gastroesophageal reflux disease)     with esophagitis  . Plantar fasciitis     left   Past Surgical History  Procedure Laterality Date  .  Tubal ligation  1987   History  Substance Use Topics  . Smoking status: Never Smoker   . Smokeless tobacco: Never Used  . Alcohol Use: No   Family History  Problem Relation Age of Onset  . Stroke Mother     x 2  . Heart disease Mother     congenital arrhythmia and CHF  . Depression Mother   . Prostate cancer Brother   . Stomach cancer Paternal Uncle   . Colon cancer Neg Hx   . Esophageal cancer Neg Hx   . Rectal cancer Neg Hx    Allergies  Allergen Reactions  . Neomycin-Bacitracin Zn-Polymyx     REACTION: rash   Current Outpatient Prescriptions on File Prior to Visit  Medication Sig Dispense Refill  . Melatonin 5 MG CAPS Take 1 capsule by mouth at bedtime.        . Multiple Vitamin (MULTIVITAMIN) tablet Take 1 tablet by mouth daily.        . pantoprazole (PROTONIX) 40 MG tablet Take 1 tablet (40 mg total) by mouth 2 (two) times daily before a meal.  60 tablet  5  . ST JOHNS WORT PO Take by mouth daily.  No current facility-administered medications on file prior to visit.    Review of Systems Review of Systems  Constitutional: Negative for fever, appetite change, fatigue and unexpected weight change.  Eyes: Negative for pain and visual disturbance.  Respiratory: Negative for cough and shortness of breath.   Cardiovascular: Negative for cp or palpitations    Gastrointestinal: Negative for nausea, diarrhea and constipation.  Genitourinary: Negative for urgency and frequency.  Skin: Negative for pallor or rash   MSK pos for occ aches and pains  Neurological: Negative for weakness, light-headedness, numbness and headaches.  Hematological: Negative for adenopathy. Does not bruise/bleed easily.  Psychiatric/Behavioral: Negative for dysphoric mood. The patient is not nervous/anxious.         Objective:   Physical Exam  Constitutional: She appears well-developed and well-nourished. No distress.  HENT:  Head: Normocephalic and atraumatic.  Right Ear: External ear  normal.  Left Ear: External ear normal.  Nose: Nose normal.  Mouth/Throat: Oropharynx is clear and moist.  Eyes: Conjunctivae and EOM are normal. Pupils are equal, round, and reactive to light. Right eye exhibits no discharge. Left eye exhibits no discharge. No scleral icterus.  Neck: Normal range of motion. Neck supple. No JVD present. No thyromegaly present.  Cardiovascular: Normal rate, regular rhythm, normal heart sounds and intact distal pulses.  Exam reveals no gallop.   Pulmonary/Chest: Effort normal and breath sounds normal. No respiratory distress. She has no wheezes. She has no rales.  Abdominal: Soft. Bowel sounds are normal. She exhibits no distension and no mass. There is no tenderness.  Genitourinary: No breast swelling, tenderness, discharge or bleeding.  Breast exam: No mass, nodules, thickening, tenderness, bulging, retraction, inflamation, nipple discharge or skin changes noted.  No axillary or clavicular LA.  Chaperoned exam.    Musculoskeletal: She exhibits no edema and no tenderness.  Lymphadenopathy:    She has no cervical adenopathy.  Neurological: She is alert. She has normal reflexes. No cranial nerve deficit. She exhibits normal muscle tone. Coordination normal.  Skin: Skin is warm and dry. No rash noted. No erythema. No pallor.  Psychiatric: She has a normal mood and affect.          Assessment & Plan:

## 2012-11-13 NOTE — Patient Instructions (Signed)
I'm glad you are doing well - stay active physically and mentally

## 2012-11-15 NOTE — Assessment & Plan Note (Signed)
Reviewed health habits including diet and exercise and skin cancer prevention Also reviewed health mt list, fam hx and immunizations   Wellness labs reviewed  

## 2012-11-15 NOTE — Assessment & Plan Note (Signed)
Up to date on colonoscopy.

## 2012-11-20 ENCOUNTER — Ambulatory Visit
Admission: RE | Admit: 2012-11-20 | Discharge: 2012-11-20 | Disposition: A | Discharge status: Home / Self Care | Payer: BC Managed Care – PPO | Source: Ambulatory Visit

## 2012-11-20 DIAGNOSIS — Z1231 Encounter for screening mammogram for malignant neoplasm of breast: Secondary | ICD-10-CM

## 2013-02-16 ENCOUNTER — Other Ambulatory Visit: Payer: Self-pay | Admitting: Gastroenterology

## 2013-03-11 ENCOUNTER — Telehealth: Payer: Self-pay | Admitting: Gastroenterology

## 2013-03-11 NOTE — Telephone Encounter (Signed)
Rx is already available per pharmacy. Patient advised.

## 2013-07-24 ENCOUNTER — Other Ambulatory Visit: Payer: Self-pay | Admitting: Gastroenterology

## 2013-08-24 ENCOUNTER — Other Ambulatory Visit: Payer: Self-pay | Admitting: Gastroenterology

## 2013-10-15 ENCOUNTER — Other Ambulatory Visit: Payer: Self-pay

## 2013-10-15 DIAGNOSIS — Z1231 Encounter for screening mammogram for malignant neoplasm of breast: Secondary | ICD-10-CM

## 2013-11-10 ENCOUNTER — Other Ambulatory Visit (INDEPENDENT_AMBULATORY_CARE_PROVIDER_SITE_OTHER): Payer: 59

## 2013-11-10 ENCOUNTER — Telehealth: Payer: Self-pay | Admitting: Family Medicine

## 2013-11-10 DIAGNOSIS — K219 Gastro-esophageal reflux disease without esophagitis: Secondary | ICD-10-CM

## 2013-11-10 DIAGNOSIS — Z0000 Encounter for general adult medical examination without abnormal findings: Secondary | ICD-10-CM

## 2013-11-10 DIAGNOSIS — Z136 Encounter for screening for cardiovascular disorders: Secondary | ICD-10-CM

## 2013-11-10 LAB — COMPREHENSIVE METABOLIC PANEL
ALBUMIN: 3.5 g/dL (ref 3.5–5.2)
ALT: 21 U/L (ref 0–35)
AST: 23 U/L (ref 0–37)
Alkaline Phosphatase: 67 U/L (ref 39–117)
BUN: 21 mg/dL (ref 6–23)
CALCIUM: 9.3 mg/dL (ref 8.4–10.5)
CHLORIDE: 106 meq/L (ref 96–112)
CO2: 30 mEq/L (ref 19–32)
CREATININE: 0.9 mg/dL (ref 0.4–1.2)
GFR: 71.27 mL/min (ref >60.00)
GLUCOSE: 88 mg/dL (ref 70–99)
POTASSIUM: 4.5 meq/L (ref 3.5–5.1)
Sodium: 143 mEq/L (ref 135–145)
Total Bilirubin: 1 mg/dL (ref 0.2–1.2)
Total Protein: 6.7 g/dL (ref 6.0–8.3)

## 2013-11-10 LAB — CBC WITH DIFFERENTIAL/PLATELET
BASOS PCT: 1.1 % (ref 0.0–3.0)
Basophils Absolute: 0.1 10*3/uL (ref 0.0–0.1)
EOS PCT: 3.4 % (ref 0.0–5.0)
Eosinophils Absolute: 0.2 10*3/uL (ref 0.0–0.7)
HCT: 39.8 % (ref 36.0–46.0)
HEMOGLOBIN: 13.1 g/dL (ref 12.0–15.0)
Lymphocytes Relative: 38.7 % (ref 12.0–46.0)
Lymphs Abs: 1.9 10*3/uL (ref 0.7–4.0)
MCHC: 32.9 g/dL (ref 30.0–36.0)
MCV: 88.2 fl (ref 78.0–100.0)
MONO ABS: 0.4 10*3/uL (ref 0.1–1.0)
Monocytes Relative: 8.9 % (ref 3.0–12.0)
NEUTROS ABS: 2.3 10*3/uL (ref 1.4–7.7)
NEUTROS PCT: 47.9 % (ref 43.0–77.0)
Platelets: 243 10*3/uL (ref 150.0–400.0)
RBC: 4.51 Mil/uL (ref 3.87–5.11)
RDW: 13.4 % (ref 11.5–15.5)
WBC: 4.8 10*3/uL (ref 4.0–10.5)

## 2013-11-10 LAB — LIPID PANEL
CHOLESTEROL: 163 mg/dL (ref 0–200)
HDL: 72.9 mg/dL (ref >39.00)
LDL Cholesterol: 83 mg/dL (ref 0–99)
NonHDL: 90.1
TRIGLYCERIDES: 38 mg/dL (ref 0.0–149.0)
Total CHOL/HDL Ratio: 2
VLDL: 7.6 mg/dL (ref 0.0–40.0)

## 2013-11-10 LAB — TSH: TSH: 0.8 u[IU]/mL (ref 0.35–4.50)

## 2013-11-10 NOTE — Telephone Encounter (Signed)
Message copied by Abner Greenspan on Wed Nov 10, 2013  7:32 AM ------      Message from: Ellamae Sia      Created: Fri Nov 05, 2013  4:13 PM      Regarding: Lab orders for Wednesday, 10.21.15       Patient is scheduled for CPX labs, please order future labs, Thanks , Terri       ------

## 2013-11-17 ENCOUNTER — Encounter: Payer: Self-pay | Admitting: Family Medicine

## 2013-11-17 ENCOUNTER — Ambulatory Visit (INDEPENDENT_AMBULATORY_CARE_PROVIDER_SITE_OTHER): Payer: 59 | Admitting: Family Medicine

## 2013-11-17 VITALS — BP 126/70 | HR 67 | Temp 97.5°F | Ht 69.0 in | Wt 156.5 lb

## 2013-11-17 DIAGNOSIS — Z0000 Encounter for general adult medical examination without abnormal findings: Secondary | ICD-10-CM

## 2013-11-17 DIAGNOSIS — Z23 Encounter for immunization: Secondary | ICD-10-CM

## 2013-11-17 NOTE — Progress Notes (Signed)
Subjective:    Patient ID: Shannon [name], female    DOB: May 30, 1950, 63 y.o.   MRN: [id number]  HPI Here for health maintenance exam and to review chronic medical problems   Is doing fine  Nothing new to report   Is trying to take care of herself   Wt is down 1 lb with bmi of 23  She tries to mt her weight  Walks 2 mi per day for exercise (with good weather)   Flu vaccine- got one at the grocery store  Pneumonia vaccine - wants to get that today  Td1/12  Had zoster vaccine 8/12   Mammogram 10/14 nl - has it scheduled for Nov 5th  Self exam -no lumps  Nl pap 10/13  No hx of abn paps and no gyn symptoms   colonosc 5/13 with polyps - has a 5 year recall   Falls-none   Mood- no problems with depression or mood disorder   Results for orders placed in visit on 11/10/13  CBC WITH DIFFERENTIAL      Result Value Ref Range   WBC 4.8  4.0 - 10.5 K/uL   RBC 4.51  3.87 - 5.11 Mil/uL   Hemoglobin 13.1  12.0 - 15.0 g/dL   HCT 39.8  36.0 - 46.0 %   MCV 88.2  78.0 - 100.0 fl   MCHC 32.9  30.0 - 36.0 g/dL   RDW 13.4  11.5 - 15.5 %   Platelets 243.0  150.0 - 400.0 K/uL   Neutrophils Relative % 47.9  43.0 - 77.0 %   Lymphocytes Relative 38.7  12.0 - 46.0 %   Monocytes Relative 8.9  3.0 - 12.0 %   Eosinophils Relative 3.4  0.0 - 5.0 %   Basophils Relative 1.1  0.0 - 3.0 %   Neutro Abs 2.3  1.4 - 7.7 K/uL   Lymphs Abs 1.9  0.7 - 4.0 K/uL   Monocytes Absolute 0.4  0.1 - 1.0 K/uL   Eosinophils Absolute 0.2  0.0 - 0.7 K/uL   Basophils Absolute 0.1  0.0 - 0.1 K/uL  COMPREHENSIVE METABOLIC PANEL      Result Value Ref Range   Sodium 143  135 - 145 mEq/L   Potassium 4.5  3.5 - 5.1 mEq/L   Chloride 106  96 - 112 mEq/L   CO2 30  19 - 32 mEq/L   Glucose, Bld 88  70 - 99 mg/dL   BUN 21  6 - 23 mg/dL   Creatinine, Ser 0.9  0.4 - 1.2 mg/dL   Total Bilirubin 1.0  0.2 - 1.2 mg/dL   Alkaline Phosphatase 67  39 - 117 U/L   AST 23  0 - 37 U/L   ALT 21  0 - 35 U/L   Total Protein 6.7   6.0 - 8.3 g/dL   Albumin 3.5  3.5 - 5.2 g/dL   Calcium 9.3  8.4 - 10.5 mg/dL   GFR 71.27  >60.00 mL/min  LIPID PANEL      Result Value Ref Range   Cholesterol 163  0 - 200 mg/dL   Triglycerides 38.0  0.0 - 149.0 mg/dL   HDL 72.90  >39.00 mg/dL   VLDL 7.6  0.0 - 40.0 mg/dL   LDL Cholesterol 83  0 - 99 mg/dL   Total CHOL/HDL Ratio 2     NonHDL 90.10    TSH      Result Value Ref Range   TSH 0.80  0.35 -  4.50 uIU/mL    Good diet    Patient Active Problem List   Diagnosis Date Noted  . Routine general medical examination at a health care facility 11/05/2012  . Left hip pain 12/04/2011  . Other screening mammogram 11/13/2011  . PLANTAR FASCIITIS, LEFT 07/28/2009  . NEOPLASM, SKIN, UNCERTAIN BEHAVIOR 79/02/4095  . PERSONAL HX COLONIC POLYPS 11/02/2007  . ESOPHAGITIS, MILD 03/31/2007  . GERD 10/20/2006   Past Medical History  Diagnosis Date  . GERD (gastroesophageal reflux disease)     with esophagitis  . Plantar fasciitis     left   Past Surgical History  Procedure Laterality Date  . Tubal ligation  1987   History  Substance Use Topics  . Smoking status: Never Smoker   . Smokeless tobacco: Never Used  . Alcohol Use: No   Family History  Problem Relation Age of Onset  . Stroke Mother     x 2  . Heart disease Mother     congenital arrhythmia and CHF  . Depression Mother   . Prostate cancer Brother   . Stomach cancer Paternal Uncle   . Colon cancer Neg Hx   . Esophageal cancer Neg Hx   . Rectal cancer Neg Hx    Allergies  Allergen Reactions  . Neomycin-Bacitracin Zn-Polymyx     REACTION: rash   Current Outpatient Prescriptions on File Prior to Visit  Medication Sig Dispense Refill  . Melatonin 5 MG CAPS Take 1 capsule by mouth at bedtime.        . Multiple Vitamin (MULTIVITAMIN) tablet Take 1 tablet by mouth daily.        . pantoprazole (PROTONIX) 40 MG tablet TAKE 1 TABLET (40 MG TOTAL) BY MOUTH 2 (TWO) TIMES DAILY BEFORE A MEAL.  60 tablet  3  . ST JOHNS  WORT PO Take by mouth daily.       No current facility-administered medications on file prior to visit.    Review of Systems Review of Systems  Constitutional: Negative for fever, appetite change, fatigue and unexpected weight change.  Eyes: Negative for pain and visual disturbance.  Respiratory: Negative for cough and shortness of breath.   Cardiovascular: Negative for cp or palpitations    Gastrointestinal: Negative for nausea, diarrhea and constipation.  Genitourinary: Negative for urgency and frequency.  Skin: Negative for pallor or rash   Neurological: Negative for weakness, light-headedness, numbness and headaches.  Hematological: Negative for adenopathy. Does not bruise/bleed easily.  Psychiatric/Behavioral: Negative for dysphoric mood. The patient is not nervous/anxious.         Objective:   Physical Exam  Constitutional: She appears well-developed and well-nourished. No distress.  HENT:  Head: Normocephalic and atraumatic.  Right Ear: External ear normal.  Left Ear: External ear normal.  Mouth/Throat: Oropharynx is clear and moist.  Eyes: Conjunctivae and EOM are normal. Pupils are equal, round, and reactive to light. No scleral icterus.  Neck: Normal range of motion. Neck supple. No JVD present. Carotid bruit is not present. No thyromegaly present.  Cardiovascular: Normal rate, regular rhythm, normal heart sounds and intact distal pulses.  Exam reveals no gallop.   Pulmonary/Chest: Effort normal and breath sounds normal. No respiratory distress. She has no wheezes. She exhibits no tenderness.  Abdominal: Soft. Bowel sounds are normal. She exhibits no distension, no abdominal bruit and no mass. There is no tenderness.  Genitourinary: No breast swelling, tenderness, discharge or bleeding.  Breast exam: No mass, nodules, thickening, tenderness, bulging, retraction, inflamation, nipple discharge  or skin changes noted.  No axillary or clavicular LA.      Musculoskeletal: Normal  range of motion. She exhibits no edema and no tenderness.  Lymphadenopathy:    She has no cervical adenopathy.  Neurological: She is alert. She has normal reflexes. No cranial nerve deficit. She exhibits normal muscle tone. Coordination normal.  Skin: Skin is warm and dry. No rash noted. No erythema. No pallor.  Stable lentigos   Psychiatric: She has a normal mood and affect.          Assessment & Plan:   Problem List Items Addressed This Visit     Other   Routine general medical examination at a health care facility - Primary     Reviewed health habits including diet and exercise and skin cancer prevention Reviewed appropriate screening tests for age  Also reviewed health mt list, fam hx and immunization status , as well as social and family history   Pneumonia vaccine today  No falls  Nl dep screen  Disc advanced directive-she has one  Has mammogram planned

## 2013-11-17 NOTE — Assessment & Plan Note (Signed)
Reviewed health habits including diet and exercise and skin cancer prevention Reviewed appropriate screening tests for age  Also reviewed health mt list, fam hx and immunization status , as well as social and family history   Pneumonia vaccine today  No falls  Nl dep screen  Disc advanced directive-she has one  Has mammogram planned

## 2013-11-17 NOTE — Patient Instructions (Signed)
Pneumonia vaccine today  Keep taking good care of yourself  Labs look good

## 2013-11-17 NOTE — Progress Notes (Signed)
Pre visit review using our clinic review tool, if applicable. No additional management support is needed unless otherwise documented below in the visit note. 

## 2013-11-19 ENCOUNTER — Other Ambulatory Visit: Payer: Self-pay | Admitting: Gastroenterology

## 2013-11-25 ENCOUNTER — Ambulatory Visit: Payer: BC Managed Care – PPO

## 2013-12-09 ENCOUNTER — Ambulatory Visit
Admission: RE | Admit: 2013-12-09 | Discharge: 2013-12-09 | Disposition: A | Discharge status: Home / Self Care | Payer: Medicare Other | Source: Ambulatory Visit

## 2013-12-09 DIAGNOSIS — Z1231 Encounter for screening mammogram for malignant neoplasm of breast: Secondary | ICD-10-CM

## 2014-02-16 ENCOUNTER — Other Ambulatory Visit: Payer: Self-pay | Admitting: Gastroenterology

## 2014-08-23 ENCOUNTER — Encounter: Payer: Self-pay | Admitting: Gastroenterology

## 2014-09-22 ENCOUNTER — Ambulatory Visit (INDEPENDENT_AMBULATORY_CARE_PROVIDER_SITE_OTHER): Payer: Medicare Other | Admitting: Internal Medicine

## 2014-09-22 ENCOUNTER — Encounter: Payer: Self-pay | Admitting: Internal Medicine

## 2014-09-22 VITALS — BP 110/68 | HR 67 | Temp 98.1°F | Wt 160.0 lb

## 2014-09-22 DIAGNOSIS — N390 Urinary tract infection, site not specified: Secondary | ICD-10-CM

## 2014-09-22 DIAGNOSIS — R300 Dysuria: Secondary | ICD-10-CM

## 2014-09-22 LAB — POCT URINALYSIS DIPSTICK
BILIRUBIN UA: NEGATIVE
Glucose, UA: NEGATIVE
KETONES UA: NEGATIVE
Nitrite, UA: POSITIVE
PH UA: 6
SPEC GRAV UA: 1.025
Urobilinogen, UA: 0.2

## 2014-09-22 MED ORDER — NITROFURANTOIN MONOHYD MACRO 100 MG PO CAPS: 100.0000 mg | ORAL_CAPSULE | Freq: Two times a day (BID) | ORAL | Status: DC

## 2014-09-22 NOTE — Patient Instructions (Signed)

## 2014-09-22 NOTE — Addendum Note (Signed)
Addended by: Lurlean Nanny on: 09/22/2014 11:09 AM   Modules accepted: Orders

## 2014-09-22 NOTE — Progress Notes (Signed)
Pre visit review using our clinic review tool, if applicable. No additional management support is needed unless otherwise documented below in the visit note. 

## 2014-09-22 NOTE — Progress Notes (Signed)
HPI  Pt presents to the clinic today with c/o urinary frequency and dysuria. This started 4 days ago. She denies fever, chills, nausea or low back pain. She denies vaginal complaints. She has increased her fluids and tried AZO with some relief. She has had a UTI in the past and reports this feels the same.   Review of Systems  Past Medical History  Diagnosis Date  . GERD (gastroesophageal reflux disease)     with esophagitis  . Plantar fasciitis     left    Family History  Problem Relation Age of Onset  . Stroke Mother     x 2  . Heart disease Mother     congenital arrhythmia and CHF  . Depression Mother   . Prostate cancer Brother   . Stomach cancer Paternal Uncle   . Colon cancer Neg Hx   . Esophageal cancer Neg Hx   . Rectal cancer Neg Hx     Social History   Social History  . Marital Status: Married    Spouse Name: N/A  . Number of Children: N/A  . Years of Education: N/A   Occupational History  . Not on file.   Social History Main Topics  . Smoking status: Never Smoker   . Smokeless tobacco: Never Used  . Alcohol Use: No  . Drug Use: No  . Sexual Activity: Not on file   Other Topics Concern  . Not on file   Social History Narrative    Allergies  Allergen Reactions  . Neomycin-Bacitracin Zn-Polymyx     REACTION: rash    Constitutional: Denies fever, malaise, fatigue, headache or abrupt weight changes.   GU: Pt reports frequency and pain with urination. Denies urgency, blood in urine, odor or discharge. Skin: Denies redness, rashes, lesions or ulcercations.   No other specific complaints in a complete review of systems (except as listed in HPI above).    Objective:   Physical Exam  BP 110/68 mmHg  Pulse 67  Temp(Src) 98.1 F (36.7 C) (Oral)  Wt 160 lb (72.576 kg)  SpO2 98% Wt Readings from Last 3 Encounters:  09/22/14 160 lb (72.576 kg)  11/17/13 156 lb 8 oz (70.988 kg)  11/13/12 155 lb 12 oz (70.648 kg)    General: Appears her  stated age, well developed, well nourished in NAD. Cardiovascular: Normal rate and rhythm. S1,S2 noted.   Pulmonary/Chest: Normal effort and positive vesicular breath sounds. No respiratory distress. No wheezes, rales or ronchi noted.  Abdomen: Soft and nontender. Normal bowel sounds, no bruits noted. No distention or masses noted. No CVA tenderness.      Assessment & Plan:   Frequency, Dysuria secondary to UTI:  Urinalysis: 2+ leuks, pos nitrites, 1+ blood Will send urine culture eRx sent if for Macrobid 100 mg BID x 5 days OK to take AZO OTC Drink plenty of fluids  RTC as needed or if symptoms persist.

## 2014-09-23 LAB — URINE CULTURE: Colony Count: >=100000

## 2014-11-08 ENCOUNTER — Other Ambulatory Visit: Payer: Self-pay

## 2014-11-08 DIAGNOSIS — Z1231 Encounter for screening mammogram for malignant neoplasm of breast: Secondary | ICD-10-CM

## 2014-11-14 ENCOUNTER — Other Ambulatory Visit: Payer: Medicare Other

## 2014-11-21 ENCOUNTER — Encounter: Payer: Medicare Other | Admitting: Family Medicine

## 2014-11-24 ENCOUNTER — Telehealth: Payer: Self-pay | Admitting: Family Medicine

## 2014-11-24 DIAGNOSIS — Z0000 Encounter for general adult medical examination without abnormal findings: Secondary | ICD-10-CM

## 2014-11-24 NOTE — Telephone Encounter (Signed)
-----   Message from Ellamae Sia sent at 11/22/2014  8:35 AM EDT ----- Regarding: Lab orders for Friday, 11.4.16 Patient is scheduled for CPX labs, please order future labs, Thanks , Karna Christmas

## 2014-11-25 ENCOUNTER — Other Ambulatory Visit (INDEPENDENT_AMBULATORY_CARE_PROVIDER_SITE_OTHER): Payer: Medicare Other

## 2014-11-25 DIAGNOSIS — Z0000 Encounter for general adult medical examination without abnormal findings: Secondary | ICD-10-CM

## 2014-11-25 LAB — LIPID PANEL
CHOLESTEROL: 143 mg/dL (ref 0–200)
HDL: 68.3 mg/dL (ref >39.00)
LDL CALC: 67 mg/dL (ref 0–99)
NonHDL: 75.1
TRIGLYCERIDES: 41 mg/dL (ref 0.0–149.0)
Total CHOL/HDL Ratio: 2
VLDL: 8.2 mg/dL (ref 0.0–40.0)

## 2014-11-25 LAB — CBC WITH DIFFERENTIAL/PLATELET
BASOS PCT: 0.6 % (ref 0.0–3.0)
Basophils Absolute: 0 10*3/uL (ref 0.0–0.1)
EOS PCT: 3.5 % (ref 0.0–5.0)
Eosinophils Absolute: 0.2 10*3/uL (ref 0.0–0.7)
HEMATOCRIT: 40.9 % (ref 36.0–46.0)
HEMOGLOBIN: 13.6 g/dL (ref 12.0–15.0)
LYMPHS PCT: 29.8 % (ref 12.0–46.0)
Lymphs Abs: 1.8 10*3/uL (ref 0.7–4.0)
MCHC: 33.3 g/dL (ref 30.0–36.0)
MCV: 88.8 fl (ref 78.0–100.0)
MONO ABS: 0.5 10*3/uL (ref 0.1–1.0)
Monocytes Relative: 8.6 % (ref 3.0–12.0)
Neutro Abs: 3.4 10*3/uL (ref 1.4–7.7)
Neutrophils Relative %: 57.5 % (ref 43.0–77.0)
Platelets: 257 10*3/uL (ref 150.0–400.0)
RBC: 4.61 Mil/uL (ref 3.87–5.11)
RDW: 13.4 % (ref 11.5–15.5)
WBC: 5.9 10*3/uL (ref 4.0–10.5)

## 2014-11-25 LAB — COMPREHENSIVE METABOLIC PANEL
ALBUMIN: 3.7 g/dL (ref 3.5–5.2)
ALT: 18 U/L (ref 0–35)
AST: 20 U/L (ref 0–37)
Alkaline Phosphatase: 70 U/L (ref 39–117)
BUN: 29 mg/dL — ABNORMAL HIGH (ref 6–23)
CALCIUM: 9.2 mg/dL (ref 8.4–10.5)
CHLORIDE: 103 meq/L (ref 96–112)
CO2: 33 mEq/L — ABNORMAL HIGH (ref 19–32)
Creatinine, Ser: 0.87 mg/dL (ref 0.40–1.20)
GFR: 69.16 mL/min (ref >60.00)
Glucose, Bld: 107 mg/dL — ABNORMAL HIGH (ref 70–99)
POTASSIUM: 4.5 meq/L (ref 3.5–5.1)
Sodium: 141 mEq/L (ref 135–145)
Total Bilirubin: 0.8 mg/dL (ref 0.2–1.2)
Total Protein: 6.4 g/dL (ref 6.0–8.3)

## 2014-11-25 LAB — TSH: TSH: 1.03 u[IU]/mL (ref 0.35–4.50)

## 2014-11-30 ENCOUNTER — Ambulatory Visit (INDEPENDENT_AMBULATORY_CARE_PROVIDER_SITE_OTHER): Payer: Medicare Other | Admitting: Family Medicine

## 2014-11-30 ENCOUNTER — Encounter: Payer: Self-pay | Admitting: Family Medicine

## 2014-11-30 VITALS — BP 122/66 | HR 60 | Temp 98.6°F | Ht 68.75 in | Wt 161.0 lb

## 2014-11-30 DIAGNOSIS — Z23 Encounter for immunization: Secondary | ICD-10-CM

## 2014-11-30 DIAGNOSIS — Z0000 Encounter for general adult medical examination without abnormal findings: Secondary | ICD-10-CM | POA: Insufficient documentation

## 2014-11-30 NOTE — Progress Notes (Signed)
Pre visit review using our clinic review tool, if applicable. No additional management support is needed unless otherwise documented below in the visit note. 

## 2014-11-30 NOTE — Progress Notes (Signed)
Subjective:    Patient ID: Shannon [name], female    DOB: 04-27-1950, 64 y.o.   MRN: [id number]  HPI  Here for annual medicare wellness visit as well as chronic/acute medical problems as well as annual preventative exam  I have personally reviewed the Medicare Annual Wellness questionnaire and have noted 1. The patient's medical and social history 2. Their use of alcohol, tobacco or illicit drugs 3. Their current medications and supplements 4. The patient's functional ability including ADL's, fall risks, home safety risks and hearing or visual             impairment. 5. Diet and physical activities 6. Evidence for depression or mood disorders  The patients weight, height, BMI have been recorded in the chart and visual acuity is per eye clinic.  I have made referrals, counseling and provided education to the patient based review of the above and I have provided the pt with a written personalized care plan for preventive services. Reviewed and updated provider list, see scanned forms.  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  Routine anticipatory guidance given to patient.  See health maintenance. Colon cancer screening 5/13 with 5 year recall  Breast cancer screening mm 11/15 -has one set up this mo  Self breast exam -no lumps  Flu vaccine- had one in late oct  Tetanus vaccine 1/12 Nl pap in 10/13 (no hx of problems)  Pneumovax 10/15  Zoster vaccine 8/12 Hep C screen - she thinks she has had a  blood transfusion in 1978 , but has donated blood and been tested since then  Advance directive - she has a living will and POA  Cognitive function addressed- see scanned forms- and if abnormal then additional documentation follows.  No major concerns   PMH and SH reviewed  Meds, vitals, and allergies reviewed.   ROS: See HPI.  Otherwise negative.     Wt is up 1 lb bmi of 23  Results for orders placed or performed in visit on 11/25/14  CBC with Differential/Platelet  Result Value Ref Range   WBC 5.9 4.0 - 10.5 K/uL   RBC 4.61 3.87 - 5.11 Mil/uL   Hemoglobin 13.6 12.0 - 15.0 g/dL   HCT 40.9 36.0 - 46.0 %   MCV 88.8 78.0 - 100.0 fl   MCHC 33.3 30.0 - 36.0 g/dL   RDW 13.4 11.5 - 15.5 %   Platelets 257.0 150.0 - 400.0 K/uL   Neutrophils Relative % 57.5 43.0 - 77.0 %   Lymphocytes Relative 29.8 12.0 - 46.0 %   Monocytes Relative 8.6 3.0 - 12.0 %   Eosinophils Relative 3.5 0.0 - 5.0 %   Basophils Relative 0.6 0.0 - 3.0 %   Neutro Abs 3.4 1.4 - 7.7 K/uL   Lymphs Abs 1.8 0.7 - 4.0 K/uL   Monocytes Absolute 0.5 0.1 - 1.0 K/uL   Eosinophils  Absolute 0.2 0.0 - 0.7 K/uL   Basophils Absolute 0.0 0.0 - 0.1 K/uL  Comprehensive metabolic panel  Result Value Ref Range   Sodium 141 135 - 145 mEq/L   Potassium 4.5 3.5 - 5.1 mEq/L   Chloride 103 96 - 112 mEq/L   CO2 33 (H) 19 - 32 mEq/L   Glucose, Bld 107 (H) 70 - 99 mg/dL   BUN 29 (H) 6 - 23 mg/dL   Creatinine, Ser 0.87 0.40 - 1.20 mg/dL   Total Bilirubin 0.8 0.2 - 1.2 mg/dL   Alkaline Phosphatase 70 39 - 117 U/L   AST 20 0 - 37 U/L   ALT 18 0 - 35 U/L   Total Protein 6.4 6.0 - 8.3 g/dL   Albumin 3.7 3.5 - 5.2 g/dL   Calcium 9.2 8.4 - 10.5 mg/dL   GFR 69.16 >60.00 mL/min  Lipid panel  Result Value Ref Range   Cholesterol 143 0 - 200 mg/dL   Triglycerides 41.0 0.0 - 149.0 mg/dL   HDL 68.30 >39.00 mg/dL   VLDL 8.2 0.0 - 40.0 mg/dL   LDL Cholesterol 67 0 - 99 mg/dL   Total CHOL/HDL Ratio 2    NonHDL 75.10   TSH  Result Value  Ref Range   TSH 1.03 0.35 - 4.50 uIU/mL      Patient Active Problem List   Diagnosis Date Noted  . Medicare annual wellness visit, initial 11/30/2014  . Routine general medical examination at a health care facility 11/05/2012  . Other screening mammogram 11/13/2011  . PLANTAR FASCIITIS, LEFT 07/28/2009  . NEOPLASM, SKIN, UNCERTAIN BEHAVIOR 54/00/8676  . PERSONAL HX COLONIC POLYPS 11/02/2007  . ESOPHAGITIS, MILD 03/31/2007  . GERD 10/20/2006   Past Medical History  Diagnosis Date  . GERD (gastroesophageal reflux disease)     with esophagitis  . Plantar fasciitis     left   Past Surgical History  Procedure Laterality Date  . Tubal ligation  1987   Social History  Substance Use Topics  . Smoking status: Never Smoker   . Smokeless tobacco: Never Used  . Alcohol Use: No     Comment: rare   Family History  Problem Relation Age of Onset  . Stroke Mother     x 2  . Heart disease Mother     congenital arrhythmia and CHF  . Depression Mother   . Prostate cancer Brother   . Stomach cancer Paternal Uncle   . Colon cancer Neg Hx   . Esophageal cancer Neg Hx   . Rectal cancer Neg Hx    Allergies  Allergen Reactions  . Neomycin-Bacitracin Zn-Polymyx     REACTION: rash   Current Outpatient Prescriptions on File Prior to Visit  Medication Sig Dispense Refill  . Melatonin 5 MG CAPS Take 1 capsule by mouth at bedtime.      . Multiple Vitamin (MULTIVITAMIN) tablet Take 1 tablet by mouth daily.      . ST JOHNS WORT PO Take by mouth daily.     No current facility-administered medications on file prior to visit.     Review of Systems    Review of Systems  Constitutional: Negative for fever, appetite change, fatigue and unexpected weight change.  Eyes: Negative for pain and visual disturbance.  Respiratory: Negative for cough and shortness of breath.   Cardiovascular: Negative for cp or palpitations    Gastrointestinal: Negative for nausea, diarrhea and constipation.    Genitourinary: Negative for urgency and frequency. pos for  uterine prolapse  Skin: Negative for pallor or rash   MSK pos for R shoulder/back and neck pain -ongoing  Neurological: Negative for weakness, light-headedness, numbness and headaches.  Hematological: Negative for adenopathy. Does not bruise/bleed easily.  Psychiatric/Behavioral: Negative for dysphoric mood. The patient is not nervous/anxious.      Objective:   Physical Exam  Constitutional: She appears well-developed and well-nourished. No distress.  Well appearing   HENT:  Head: Normocephalic and atraumatic.  Right Ear: External ear normal.  Left Ear: External ear normal.  Mouth/Throat: Oropharynx is clear and moist.  Eyes: Conjunctivae and EOM are normal. Pupils are equal, round, and reactive to light. No scleral icterus.  Neck: Normal range of motion. Neck supple. No JVD present. Carotid bruit is not present. No thyromegaly present.  Cardiovascular: Normal rate, regular rhythm, normal heart sounds and intact distal pulses.  Exam reveals no gallop.   Pulmonary/Chest: Effort normal and breath sounds normal. No respiratory distress. She has no wheezes. She exhibits no tenderness.  Abdominal: Soft. Bowel sounds are normal. She exhibits no distension, no abdominal bruit and no mass. There is no tenderness.  Genitourinary: Vagina normal. No breast swelling, tenderness, discharge or bleeding. There is no rash, tenderness or lesion on the right labia. There is no rash, tenderness or lesion on the left labia. Uterus is not enlarged and not tender. Cervix exhibits no discharge. Right adnexum displays no mass, no tenderness and no fullness. Left adnexum displays no mass, no tenderness and no fullness. No bleeding in the vagina. No vaginal discharge found.  Breast exam: No mass, nodules, thickening, tenderness, bulging, retraction, inflamation, nipple discharge or skin changes noted.  No axillary or clavicular LA.      Uterine prolapse  noted on valsalva-mild to moderate  Pelvic floor strength is fair   Musculoskeletal: Normal range of motion. She exhibits no edema or tenderness.  Lymphadenopathy:    She has no cervical adenopathy.  Neurological: She is alert. She has normal reflexes. No cranial nerve deficit. She exhibits normal muscle tone. Coordination normal.  Skin: Skin is warm and dry. No rash noted. No erythema. No pallor.  Psychiatric: She has a normal mood and affect.          Assessment & Plan:   Problem List Items Addressed This Visit      Other   Medicare annual wellness visit, initial    Reviewed health habits including diet and exercise and skin cancer prevention Reviewed appropriate screening tests for age  Also reviewed health mt list, fam hx and immunization status , as well as social and family history   See HPI Uterine prolapse disc-will obs  Lab rev prevnar vaccine today       Routine general medical examination at a health care facility - Primary    Reviewed health habits including diet and exercise and skin cancer prevention Reviewed appropriate screening tests for age  Also reviewed health mt list, fam hx and immunization status , as well as social and family history   See HPI Uterine prolapse disc-will obs  Lab rev prevnar vaccine today        Other Visit Diagnoses    Need for vaccination with 13-polyvalent pneumococcal conjugate vaccine        Relevant Orders    Pneumococcal conjugate vaccine 13-valent (Completed)

## 2014-11-30 NOTE — Patient Instructions (Signed)
prevnar vaccine today  Take care of yourself  If uterine prolapse starts to cause symptoms please let me know

## 2014-12-01 NOTE — Assessment & Plan Note (Signed)
Reviewed health habits including diet and exercise and skin cancer prevention Reviewed appropriate screening tests for age  Also reviewed health mt list, fam hx and immunization status , as well as social and family history   See HPI Uterine prolapse disc-will obs  Lab rev prevnar vaccine today

## 2014-12-02 ENCOUNTER — Telehealth: Payer: Self-pay | Admitting: Family Medicine

## 2014-12-02 DIAGNOSIS — M546 Pain in thoracic spine: Secondary | ICD-10-CM | POA: Insufficient documentation

## 2014-12-02 DIAGNOSIS — M542 Cervicalgia: Secondary | ICD-10-CM | POA: Insufficient documentation

## 2014-12-02 DIAGNOSIS — M25511 Pain in right shoulder: Secondary | ICD-10-CM | POA: Insufficient documentation

## 2014-12-02 NOTE — Telephone Encounter (Signed)
We disc this at her visit  xrays ordered

## 2014-12-02 NOTE — Telephone Encounter (Signed)
PT called and would like to have back and right shoulder x-ray orders placed in the system.  She will come in next week to have completed.

## 2014-12-02 NOTE — Telephone Encounter (Signed)
Pt notified orders done

## 2014-12-05 ENCOUNTER — Ambulatory Visit (INDEPENDENT_AMBULATORY_CARE_PROVIDER_SITE_OTHER)
Admission: RE | Admit: 2014-12-05 | Discharge: 2014-12-05 | Disposition: A | Discharge status: Home / Self Care | Payer: Medicare Other | Source: Ambulatory Visit | Attending: Family Medicine | Admitting: Family Medicine

## 2014-12-05 DIAGNOSIS — M25511 Pain in right shoulder: Secondary | ICD-10-CM

## 2014-12-05 DIAGNOSIS — M542 Cervicalgia: Secondary | ICD-10-CM

## 2014-12-05 DIAGNOSIS — M546 Pain in thoracic spine: Secondary | ICD-10-CM

## 2014-12-06 ENCOUNTER — Telehealth: Payer: Self-pay | Admitting: Family Medicine

## 2014-12-06 DIAGNOSIS — M542 Cervicalgia: Secondary | ICD-10-CM

## 2014-12-06 NOTE — Telephone Encounter (Signed)
Pt would like a referral to an orthopedist.  She would prefer [place]  cb number is [phone] Thank you

## 2014-12-06 NOTE — Telephone Encounter (Signed)
Referral done

## 2014-12-12 ENCOUNTER — Ambulatory Visit
Admission: RE | Admit: 2014-12-12 | Discharge: 2014-12-12 | Disposition: A | Discharge status: Home / Self Care | Payer: Medicare Other | Source: Ambulatory Visit

## 2014-12-12 DIAGNOSIS — Z1231 Encounter for screening mammogram for malignant neoplasm of breast: Secondary | ICD-10-CM

## 2014-12-21 ENCOUNTER — Ambulatory Visit (INDEPENDENT_AMBULATORY_CARE_PROVIDER_SITE_OTHER): Payer: Medicare Other | Admitting: Family Medicine

## 2014-12-21 ENCOUNTER — Encounter: Payer: Self-pay | Admitting: Family Medicine

## 2014-12-21 VITALS — BP 106/68 | HR 64 | Temp 98.2°F | Ht 68.75 in | Wt 162.8 lb

## 2014-12-21 DIAGNOSIS — N3000 Acute cystitis without hematuria: Secondary | ICD-10-CM | POA: Diagnosis not present

## 2014-12-21 DIAGNOSIS — R300 Dysuria: Secondary | ICD-10-CM | POA: Diagnosis not present

## 2014-12-21 DIAGNOSIS — N390 Urinary tract infection, site not specified: Secondary | ICD-10-CM | POA: Insufficient documentation

## 2014-12-21 LAB — POCT URINALYSIS DIPSTICK
Bilirubin, UA: NEGATIVE
Blood, UA: 10
Glucose, UA: NEGATIVE
KETONES UA: NEGATIVE
Nitrite, UA: NEGATIVE
PH UA: 6
PROTEIN UA: NEGATIVE
UROBILINOGEN UA: 0.2

## 2014-12-21 MED ORDER — CIPROFLOXACIN HCL 250 MG PO TABS: 250.0000 mg | ORAL_TABLET | Freq: Two times a day (BID) | ORAL | Status: DC

## 2014-12-21 NOTE — Assessment & Plan Note (Signed)
Mildly pos ua and classic symptoms  cx from sept non conclusive -pt tx with macrobid Will cx and tx with low dose cipro Enc water intake Disc ways to prev uti -handout given  Update if not starting to improve in a week or if worsening

## 2014-12-21 NOTE — Progress Notes (Signed)
Pre visit review using our clinic review tool, if applicable. No additional management support is needed unless otherwise documented below in the visit note. 

## 2014-12-21 NOTE — Patient Instructions (Signed)
Drink lots of water  Empty bladder when you need to  Take cipro as directed  We will contact you when culture returns

## 2014-12-21 NOTE — Progress Notes (Signed)
Subjective:    Patient ID: Shannon [name], female    DOB: 1950-12-03, 64 y.o.   MRN: [id number]  HPI Urinary symptoms   Started Sat - dysuria and frequency - drank a lot of water and got better Worse yesterday and today  Got up all night to go to the bathroom  Uncomfortable   Just had one 2 mo ago   Results for orders placed or performed in visit on 12/21/14  POCT urinalysis dipstick  Result Value Ref Range   Color, UA Yellow    Clarity, UA Hazy    Glucose, UA Neg.    Bilirubin, UA Neg.    Ketones, UA Neg.    Spec Grav, UA >=1.030    Blood, UA 10 Ery/uL    pH, UA 6.0    Protein, UA Neg.    Urobilinogen, UA 0.2    Nitrite, UA Neg.    Leukocytes, UA small (1+) (A) Negative    Drinks herbal tea No soda / no caff No art sweetner   Does not take baths  Tries not to hold her urine too long   Treated for uti in Sept with macrobid  Got totally better  cx showed no predom bacteria   Patient Active Problem List   Diagnosis Date Noted  . Right shoulder pain 12/02/2014  . Neck pain on right side 12/02/2014  . Thoracic back pain 12/02/2014  . Medicare annual wellness visit, initial 11/30/2014  . Routine general medical examination at a health care facility 11/05/2012  . Other screening mammogram 11/13/2011  . PLANTAR FASCIITIS, LEFT 07/28/2009  . NEOPLASM, SKIN, UNCERTAIN BEHAVIOR [id number]  . PERSONAL HX COLONIC POLYPS 11/02/2007  . ESOPHAGITIS, MILD 03/31/2007  . GERD 10/20/2006   Past Medical History  Diagnosis Date  . GERD (gastroesophageal reflux disease)     with esophagitis  . Plantar fasciitis     left   Past Surgical History  Procedure Laterality Date  . Tubal ligation  1987   Social History  Substance Use Topics  . Smoking status: Never Smoker   . Smokeless tobacco: Never Used  . Alcohol Use: No     Comment: rare   Family History  Problem Relation Age of Onset  . Stroke Mother     x 2  . Heart disease Mother     congenital arrhythmia  and CHF  . Depression Mother   . Prostate cancer Brother   . Stomach cancer Paternal Uncle   . Colon cancer Neg Hx   . Esophageal cancer Neg Hx   . Rectal cancer Neg Hx    Allergies  Allergen Reactions  . Neomycin-Bacitracin Zn-Polymyx     REACTION: rash   Current Outpatient Prescriptions on File Prior to Visit  Medication Sig Dispense Refill  . Melatonin 5 MG CAPS Take 1 capsule by mouth at bedtime.      . Multiple Vitamin (MULTIVITAMIN) tablet Take 1 tablet by mouth daily.      . ST JOHNS WORT PO Take by mouth daily.     No current facility-administered medications on file prior to visit.     Review of Systems Review of Systems  Constitutional: Negative for fever, appetite change, fatigue and unexpected weight change.  Eyes: Negative for pain and visual disturbance.  Respiratory: Negative for cough and shortness of breath.   Cardiovascular: Negative for cp or palpitations    Gastrointestinal: Negative for nausea, diarrhea and constipation.  Genitourinary: pos for urgency and frequency. neg for  hematuria or flank pain  Skin: Negative for pallor or rash   Neurological: Negative for weakness, light-headedness, numbness and headaches.  Hematological: Negative for adenopathy. Does not bruise/bleed easily.  Psychiatric/Behavioral: Negative for dysphoric mood. The patient is not nervous/anxious.         Objective:   Physical Exam  Constitutional: She appears well-developed and well-nourished. No distress.  Well appearing   HENT:  Head: Normocephalic and atraumatic.  Eyes: Conjunctivae and EOM are normal. Pupils are equal, round, and reactive to light.  Neck: Normal range of motion. Neck supple.  Cardiovascular: Normal rate, regular rhythm and normal heart sounds.   Pulmonary/Chest: Effort normal and breath sounds normal.  Abdominal: Soft. Bowel sounds are normal. She exhibits no distension. There is tenderness. There is no rebound.  No cva tenderness  Mild suprapubic  tenderness  Musculoskeletal: She exhibits no edema.  Lymphadenopathy:    She has no cervical adenopathy.  Neurological: She is alert.  Skin: No rash noted.  Psychiatric: She has a normal mood and affect.          Assessment & Plan:   Problem List Items Addressed This Visit      Genitourinary   UTI (urinary tract infection) - Primary    Mildly pos ua and classic symptoms  cx from sept non conclusive -pt tx with macrobid Will cx and tx with low dose cipro Enc water intake Disc ways to prev uti -handout given  Update if not starting to improve in a week or if worsening        Relevant Orders   Urine culture    Other Visit Diagnoses    Dysuria        Relevant Orders    POCT urinalysis dipstick (Completed)

## 2014-12-22 ENCOUNTER — Ambulatory Visit: Payer: Medicare Other | Attending: Sports Medicine | Admitting: Physical Therapy

## 2014-12-22 DIAGNOSIS — M542 Cervicalgia: Secondary | ICD-10-CM | POA: Diagnosis present

## 2014-12-22 DIAGNOSIS — M25511 Pain in right shoulder: Secondary | ICD-10-CM | POA: Insufficient documentation

## 2014-12-22 DIAGNOSIS — G8929 Other chronic pain: Secondary | ICD-10-CM | POA: Insufficient documentation

## 2014-12-22 DIAGNOSIS — M25512 Pain in left shoulder: Secondary | ICD-10-CM | POA: Diagnosis present

## 2014-12-22 DIAGNOSIS — M546 Pain in thoracic spine: Secondary | ICD-10-CM | POA: Insufficient documentation

## 2014-12-22 NOTE — Therapy (Signed)
[facility] [address], [place], [address] Phone: [phone]   Fax:  [phone]  Physical Therapy Evaluation  Patient Details  Name: Shannon [name] MRN: [id number] Date of Birth: Oct 26, 1950 Referring Provider: Berle Mull  Encounter Date: 12/22/2014      PT End of Session - 12/22/14 1028    Visit Number 1   Number of Visits 13   Date for PT Re-Evaluation 02/04/15   Authorization Type g code   Authorization - Visit Number 1   PT Start Time 0830   PT Stop Time 0925   PT Time Calculation (min) 55 min   Equipment Utilized During Treatment Gait belt      Past Medical History  Diagnosis Date  . GERD (gastroesophageal reflux disease)     with esophagitis  . Plantar fasciitis     left    Past Surgical History  Procedure Laterality Date  . Tubal ligation  1987    There were no vitals filed for this visit.  Visit Diagnosis:  Cervicalgia  Chronic thoracic spine pain  Bilateral shoulder pain      Subjective Assessment - 12/22/14 0835    Subjective Pt reports B shoulder pain, much worse on R.    Pertinent History Pt has several year history of shoulder pain. She had chiropractic treatment at that time which helped. She has had intermittent pain since that time. Pain worsened in October. Pt reports intermittent neck pain, particularly when reading.    Limitations Sitting;Walking   How long can you stand comfortably? standing with hands in front, especially holding things.   How long can you walk comfortably? walking for one mile produces back bain.   Diagnostic tests imaging - DDD but no bone spurs noted in shoulder.   Patient Stated Goals to be able walk and work in her kitchen with no pain.   Currently in Pain? No/denies            [facility] PT Assessment - 12/22/14 0001    Assessment   Medical Diagnosis R rotator cuff tendinitis   Referring Provider Berle Mull   Hand Dominance Right    Prior Therapy chiropractic   Precautions   Precautions None   Restrictions   Weight Bearing Restrictions No   Balance Screen   Has the patient fallen in the past 6 months No   Has the patient had a decrease in activity level because of a fear of falling?  No   Is the patient reluctant to leave their home because of a fear of falling?  No   Prior Function   Level of Independence Independent   Vocation Works at home   [employer] working for long period of time to and cooking.   Leisure walking, cooking, playing the flute.   Posture/Postural Control   Posture Comments FHP, R scapula rolled forward.   ROM / Strength   AROM / PROM / Strength AROM   AROM   Overall AROM Comments Cervical motion WNL except R rotation produces pain in R levator scap and limited by 10 degr. shoulder AROM is WNL as is PROM except R IR limited and painful (L3 vs T6) . Strength is limited and painful on R (3/5 deltoid, 3/5 periscapular, 5/5 on L)..   Palpation   Spinal mobility hypomobility and pain reproduced at T5-T7.   Palpation comment reproduction of painwith palpation of mid/low trap, biceps tendons B, R levator scap.  Objective: Prone grade II 3x1 min CPAs and R UPAs, as well as R sided STM performed on mid and low trap.  1st rib mobs 1x30 grade II - high degree of pain with this so did not continue.  Following this pt reported decr. Pain with shoulder flexion.  Issued and had pt perform HEP of:  Arm swings 3x1 min forward, 3x1 min rotational to facilitate separation between cervical/shoulder musculature.  RTB isometric ER holds 3x10 sec. Holds, cued pt to perform 3x5 at home to improve scapular control and decr. Pain in periscapular region.                 PT Education - 18-Jan-2015 1025    Education provided Yes   Education Details HEP/course of progress with PT    Person(s) Educated Patient   Methods Explanation   Comprehension Verbalized  understanding             PT Long Term Goals - 01/18/2015 1044    PT LONG TERM GOAL #1   Title Pt will improve QuickDASH to less than 20/100 indicating self reported improvement in shoulder function   Baseline 38   Time 6   Period Weeks   Status New   PT LONG TERM GOAL #2   Title Pt will be able to stand at kitchen for 30 min with no pain in thoracic spine.   Baseline 5 min   Time 6   Period Weeks   Status New   PT LONG TERM GOAL #3   Title Pt will be able to amb. 2 miles pain free to return to more normal exercise program   Baseline pt able to amb less than 1 mile.   Time 6   Period Weeks   Status New               Plan - 01/18/15 1031    Clinical Impression Statement Pt is a pleasant 64 y/o female with c/o chronic shoulder, back and neck pain. Pain started at least 13 yrs ago and pt has had intermitent pain since then with walking, standing, cooking, and working at her desk. Currently pt presents with muscle weakness in R shoulder and periscapular musculature, pain with palpation of B biceps, R levator scap, R mid/low trap, hypomobility in T5-T7 vertebra with concordant pain, and impaired motor coordination with use of R UE. Pt would benefit from skilled PT to address these deficits.   Pt will benefit from skilled therapeutic intervention in order to improve on the following deficits Pain;Improper body mechanics;Postural dysfunction;Decreased range of motion;Difficulty walking;Decreased strength   Rehab Potential Good   Clinical Impairments Affecting Rehab Potential Ability to be educated, chronic pain   PT Frequency 2x / week   PT Duration 6 weeks   PT Treatment/Interventions Dry needling;Functional mobility training;Therapeutic exercise;Manual techniques;ADLs/Self Care Home Management   PT Next Visit Plan load biceps tendons   Consulted and Agree with Plan of Care Patient          G-Codes - 01/18/2015 1048    Functional Assessment Tool Used QuickDASH, NPRS    Functional Limitation Carrying, moving and handling objects   Carrying, Moving and Handling Objects Current Status [id number]) At least 1 percent but less than 20 percent impaired, limited or restricted   Carrying, Moving and Handling Objects Goal Status [id number]) At least 1 percent but less than 20 percent impaired, limited or restricted       Problem List Patient Active Problem List   Diagnosis  Date Noted  . UTI (urinary tract infection) 12/21/2014  . Right shoulder pain 12/02/2014  . Neck pain on right side 12/02/2014  . Thoracic back pain 12/02/2014  . Medicare annual wellness visit, initial 11/30/2014  . Routine general medical examination at a health care facility 11/05/2012  . Other screening mammogram 11/13/2011  . PLANTAR FASCIITIS, LEFT 07/28/2009  . NEOPLASM, SKIN, UNCERTAIN BEHAVIOR [id number]  . PERSONAL HX COLONIC POLYPS 11/02/2007  . ESOPHAGITIS, MILD 03/31/2007  . GERD 10/20/2006    [facility] PT 12/22/2014, 10:53 AM  [facility] [address] [place], [address] Phone: [phone]   Fax:  [phone]  Name: Shannon [name] MRN: [id number] Date of Birth: August 21, 1950

## 2014-12-24 LAB — URINE CULTURE

## 2014-12-26 ENCOUNTER — Ambulatory Visit: Payer: Medicare Other | Admitting: Physical Therapy

## 2014-12-26 DIAGNOSIS — M542 Cervicalgia: Secondary | ICD-10-CM

## 2014-12-26 DIAGNOSIS — M546 Pain in thoracic spine: Secondary | ICD-10-CM

## 2014-12-26 DIAGNOSIS — G8929 Other chronic pain: Secondary | ICD-10-CM

## 2014-12-26 NOTE — Therapy (Signed)
[facility] [address] [place], [address] Phone: [phone]   Fax:  [phone]  Physical Therapy Treatment  Patient Details  Name: Shannon [name] MRN: [id number] Date of Birth: September 04, 1950 Referring Provider: Berle Mull  Encounter Date: 12/26/2014      PT End of Session - 12/26/14 1700    Visit Number 2   Number of Visits 13   Date for PT Re-Evaluation 02/04/15   Authorization Type g code   Authorization - Visit Number 2   PT Start Time 1600   PT Stop Time [id number]   PT Time Calculation (min) 45 min   Equipment Utilized During Treatment Gait belt      Past Medical History  Diagnosis Date  . GERD (gastroesophageal reflux disease)     with esophagitis  . Plantar fasciitis     left    Past Surgical History  Procedure Laterality Date  . Tubal ligation  1987    There were no vitals filed for this visit.  Visit Diagnosis:  Cervicalgia  Chronic thoracic spine pain      Subjective Assessment - 12/26/14 1658    Subjective Pt reports no change in pain since previous session. she discontinued thoracic extension as it was causing pain.   Pertinent History Pt has several year history of shoulder pain. She had chiropractic treatment at that time which helped. She has had intermittent pain since that time. Pain worsened in October. Pt reports intermittent neck pain, particularly when reading.    Limitations Sitting;Walking   How long can you stand comfortably? standing with hands in front, especially holding things.   How long can you walk comfortably? walking for one mile produces back bain.   Diagnostic tests imaging - DDD but no bone spurs noted in shoulder.   Patient Stated Goals to be able walk and work in her kitchen with no pain.   Currently in Pain? No/denies          Objective: Educated on breathing when sitting.  Cervical retraction education and performance. Same performed with  chin tuck and educated on the importance of maintaining this position when reading.  Prone CPAs grade I-III 3x1 min T4-T8. Following this pt reported incr. Soreness but no pain with L rotation (previously had pain at end range with this.  Levator scap STM performed x5 min including 1st rib mob, performed in prone. Following this pt reported decr. Pain with L rotation (cervical rather than thoracic).  Educated on and had pt perform seated levator stretch. 3x30 sec.  Educated pt on using tennis ball to massage paraspinals.                       PT Education - 12/26/14 1700    Education provided Yes   Education Details HEP   Person(s) Educated Patient   Methods Explanation   Comprehension Verbalized understanding             PT Long Term Goals - 12/22/14 1044    PT LONG TERM GOAL #1   Title Pt will improve QuickDASH to less than 20/100 indicating self reported improvement in shoulder function   Baseline 38   Time 6   Period Weeks   Status New   PT LONG TERM GOAL #2   Title Pt will be able to stand at kitchen for 30 min with no pain in thoracic spine.   Baseline 5 min   Time  6   Period Weeks   Status New   PT LONG TERM GOAL #3   Title Pt will be able to amb. 2 miles pain free to return to more normal exercise program   Baseline pt able to amb less than 1 mile.   Time 6   Period Weeks   Status New               Plan - 12/26/14 1701    Clinical Impression Statement Pt made improvement within session in terms of ROM with L thoracic rotation to the L following manual treatment. Able to perform exercises well with cuing but is continuing to have moderate to high response to gentle manual tx so may benefit from additional sessions focusing on manual intervention.   Pt will benefit from skilled therapeutic intervention in order to improve on the following deficits Pain;Improper body mechanics;Postural dysfunction;Decreased range of motion;Difficulty  walking;Decreased strength   Rehab Potential Good   Clinical Impairments Affecting Rehab Potential Ability to be educated, chronic pain   PT Frequency 2x / week   PT Duration 6 weeks   PT Treatment/Interventions Dry needling;Functional mobility training;Therapeutic exercise;Manual techniques;ADLs/Self Care Home Management   PT Next Visit Plan load biceps tendons   Consulted and Agree with Plan of Care Patient        Problem List Patient Active Problem List   Diagnosis Date Noted  . UTI (urinary tract infection) 12/21/2014  . Right shoulder pain 12/02/2014  . Neck pain on right side 12/02/2014  . Thoracic back pain 12/02/2014  . Medicare annual wellness visit, initial 11/30/2014  . Routine general medical examination at a health care facility 11/05/2012  . Other screening mammogram 11/13/2011  . PLANTAR FASCIITIS, LEFT 07/28/2009  . NEOPLASM, SKIN, UNCERTAIN BEHAVIOR [id number]  . PERSONAL HX COLONIC POLYPS 11/02/2007  . ESOPHAGITIS, MILD 03/31/2007  . GERD 10/20/2006    Fisher,Benjamin PT DPT 12/26/2014, 5:04 PM  [facility] [address], [place], [address] Phone: [phone]   Fax:  [phone]  Name: Shannon [name] MRN: [id number] Date of Birth: May 06, 1950

## 2015-01-02 ENCOUNTER — Ambulatory Visit: Payer: Medicare Other | Admitting: Physical Therapy

## 2015-01-02 DIAGNOSIS — M542 Cervicalgia: Secondary | ICD-10-CM

## 2015-01-02 NOTE — Therapy (Signed)
[facility] [address], [place], [address] Phone: [phone]   Fax:  [phone]  Physical Therapy Treatment  Patient Details  Name: Shannon [name] MRN: [id number] Date of Birth: August 15, 1950 Referring Provider: Berle Mull  Encounter Date: 01/02/2015      PT End of Session - 01/02/15 1731    Visit Number 3   Number of Visits 13   Date for PT Re-Evaluation 02/04/15   Authorization Type g code   Authorization - Visit Number 3   PT Start Time [id number]   PT Stop Time 1725   PT Time Calculation (min) 40 min   Equipment Utilized During Treatment Gait belt      Past Medical History  Diagnosis Date  . GERD (gastroesophageal reflux disease)     with esophagitis  . Plantar fasciitis     left    Past Surgical History  Procedure Laterality Date  . Tubal ligation  1987    There were no vitals filed for this visit.  Visit Diagnosis:  Cervicalgia      Subjective Assessment - 01/02/15 1642    Subjective Pt reports she is having no pain currently. Had very high degree of pain with neck stretching.   Pertinent History Pt has several year history of shoulder pain. She had chiropractic treatment at that time which helped. She has had intermittent pain since that time. Pain worsened in October. Pt reports intermittent neck pain, particularly when reading.    Limitations Sitting;Walking   How long can you stand comfortably? standing with hands in front, especially holding things.   How long can you walk comfortably? walking for one mile produces back bain.   Diagnostic tests imaging - DDD but no bone spurs noted in shoulder.   Patient Stated Goals to be able walk and work in her kitchen with no pain.   Currently in Pain? No/denies              Objective: Pt consented to trigger point dry needling after being informed of risks.  TrP dry needling performed on periscapular muscles middle, low trap,  levator scap at proximal and distal points.  Noted local twitch response with palpation of periscapular muscle needling.  Following this pt no longer able to produce pain in back.  Manual CPAs grade III 3x1 min on mid thoracic T3-T7.  Standing modification of neck stretch to minimize pain, addition of 2# standing ER with abduction. Performed 2x10 with extensive cuing to minimize elbow compensation.  Pt reported 0/10 pain following session.                   PT Education - 01/02/15 1731    Education provided Yes   Education Details trigger point dry needling   Person(s) Educated Patient   Methods Explanation   Comprehension Verbalized understanding             PT Long Term Goals - 12/22/14 1044    PT LONG TERM GOAL #1   Title Pt will improve QuickDASH to less than 20/100 indicating self reported improvement in shoulder function   Baseline 38   Time 6   Period Weeks   Status New   PT LONG TERM GOAL #2   Title Pt will be able to stand at kitchen for 30 min with no pain in thoracic spine.   Baseline 5 min   Time 6   Period Weeks   Status New  PT LONG TERM GOAL #3   Title Pt will be able to amb. 2 miles pain free to return to more normal exercise program   Baseline pt able to amb less than 1 mile.   Time 6   Period Weeks   Status New               Plan - 01/02/15 1732    Clinical Impression Statement Pt responded well to session with cervical rotation. Unable to bring on back pain following dry needling which may indicate improvement however pt had no pain prior to start. Significant muscle weakness and continues to have mildly impaired ROM and difficulty walking due to pain.   Pt will benefit from skilled therapeutic intervention in order to improve on the following deficits Pain;Improper body mechanics;Postural dysfunction;Decreased range of motion;Difficulty walking;Decreased strength   Rehab Potential Good   Clinical Impairments Affecting  Rehab Potential Ability to be educated, chronic pain   PT Frequency 2x / week   PT Duration 6 weeks   PT Treatment/Interventions Dry needling;Functional mobility training;Therapeutic exercise;Manual techniques;ADLs/Self Care Home Management   PT Next Visit Plan load biceps tendons   Consulted and Agree with Plan of Care Patient        Problem List Patient Active Problem List   Diagnosis Date Noted  . UTI (urinary tract infection) 12/21/2014  . Right shoulder pain 12/02/2014  . Neck pain on right side 12/02/2014  . Thoracic back pain 12/02/2014  . Medicare annual wellness visit, initial 11/30/2014  . Routine general medical examination at a health care facility 11/05/2012  . Other screening mammogram 11/13/2011  . PLANTAR FASCIITIS, LEFT 07/28/2009  . NEOPLASM, SKIN, UNCERTAIN BEHAVIOR [id number]  . PERSONAL HX COLONIC POLYPS 11/02/2007  . ESOPHAGITIS, MILD 03/31/2007  . GERD 10/20/2006    Ranbir Chew PT DPT 01/02/2015, 5:33 PM  [facility] [address], [place], [address] Phone: [phone]   Fax:  [phone]  Name: Shannon [name] MRN: [id number] Date of Birth: [birth year]

## 2015-01-05 ENCOUNTER — Ambulatory Visit: Payer: Medicare Other | Admitting: Physical Therapy

## 2015-01-05 DIAGNOSIS — G8929 Other chronic pain: Secondary | ICD-10-CM

## 2015-01-05 DIAGNOSIS — M546 Pain in thoracic spine: Principal | ICD-10-CM

## 2015-01-05 DIAGNOSIS — M542 Cervicalgia: Secondary | ICD-10-CM | POA: Diagnosis not present

## 2015-01-05 NOTE — Therapy (Signed)
[facility] [address], [place], [address] Phone: [phone]   Fax:  [phone]  Physical Therapy Treatment  Patient Details  Name: Shannon [name] MRN: [id number] Date of Birth: [birth year] Referring Provider: Berle Mull  Encounter Date: 01/05/2015      PT End of Session - 01/05/15 1049    Visit Number 4   Number of Visits 13   Date for PT Re-Evaluation 02/04/15   Authorization Type g code   Authorization - Visit Number 4   PT Start Time 1010   PT Stop Time 1035   PT Time Calculation (min) 25 min      Past Medical History  Diagnosis Date  . GERD (gastroesophageal reflux disease)     with esophagitis  . Plantar fasciitis     left    Past Surgical History  Procedure Laterality Date  . Tubal ligation  1987    There were no vitals filed for this visit.  Visit Diagnosis:  Chronic thoracic spine pain      Subjective Assessment - 01/05/15 1008    Subjective Pt reports improvement in pain in neck and back since previous session, very mild back pain and no neck pain.   Pertinent History Pt has several year history of shoulder pain. She had chiropractic treatment at that time which helped. She has had intermittent pain since that time. Pain worsened in October. Pt reports intermittent neck pain, particularly when reading.    Limitations Sitting;Walking   How long can you stand comfortably? standing with hands in front, especially holding things.   How long can you walk comfortably? walking for one mile produces back bain.   Diagnostic tests imaging - DDD but no bone spurs noted in shoulder.   Patient Stated Goals to be able walk and work in her kitchen with no pain.   Currently in Pain? No/denies         Objective:  Trigger point dry needling performed on mid and low trap B.  CPAs grade III T4-T8, L rib mobs in same region, UPAs T5-7. Following this pt reported significant  improvement in pain with activity following this.  Standing holding 4-5 lb wt mimicking cooking, cuing for neutral posture and spinal muscle activation.  Reviewed HEP, continuing to defer neck exercises due to incr. Pain.  Added doorway rhomboid stretch. Performed 3x1 min.                        PT Education - 01/05/15 1008    Education provided Yes   Education Details progression of PT   Person(s) Educated Patient   Methods Explanation   Comprehension Verbalized understanding;Returned demonstration             PT Long Term Goals - 12/22/14 1044    PT LONG TERM GOAL #1   Title Pt will improve QuickDASH to less than 20/100 indicating self reported improvement in shoulder function   Baseline 38   Time 6   Period Weeks   Status New   PT LONG TERM GOAL #2   Title Pt will be able to stand at kitchen for 30 min with no pain in thoracic spine.   Baseline 5 min   Time 6   Period Weeks   Status New   PT LONG TERM GOAL #3   Title Pt will be able to amb. 2 miles pain free to return to more normal exercise program  Baseline pt able to amb less than 1 mile.   Time 6   Period Weeks   Status New               Plan - 01/05/15 1053    Clinical Impression Statement pt appears to be nearing complete improvement in her pain with limited activity. PT encouraged pt to return to incr. activity of flute playing, walking, reading and exercising.    Pt will benefit from skilled therapeutic intervention in order to improve on the following deficits Pain;Improper body mechanics;Postural dysfunction;Decreased range of motion;Difficulty walking;Decreased strength   Rehab Potential Good   Clinical Impairments Affecting Rehab Potential Ability to be educated, chronic pain   PT Frequency 2x / week   PT Duration 6 weeks   PT Treatment/Interventions Dry needling;Functional mobility training;Therapeutic exercise;Manual techniques;ADLs/Self Care Home Management   PT Next  Visit Plan load biceps tendons   Consulted and Agree with Plan of Care Patient        Problem List Patient Active Problem List   Diagnosis Date Noted  . UTI (urinary tract infection) 12/21/2014  . Right shoulder pain 12/02/2014  . Neck pain on right side 12/02/2014  . Thoracic back pain 12/02/2014  . Medicare annual wellness visit, initial 11/30/2014  . Routine general medical examination at a health care facility 11/05/2012  . Other screening mammogram 11/13/2011  . PLANTAR FASCIITIS, LEFT 07/28/2009  . NEOPLASM, SKIN, UNCERTAIN BEHAVIOR [id number]  . PERSONAL HX COLONIC POLYPS 11/02/2007  . ESOPHAGITIS, MILD 03/31/2007  . GERD 10/20/2006    Marshawn Ninneman PT DPT 01/05/2015, 12:01 PM  [facility] [address], [place], [address] Phone: [phone]   Fax:  [phone]  Name: MARILEA [name] MRN: [id number] Date of Birth: 01/03/1951

## 2015-01-09 ENCOUNTER — Ambulatory Visit: Payer: Medicare Other | Admitting: Physical Therapy

## 2015-01-09 DIAGNOSIS — G8929 Other chronic pain: Secondary | ICD-10-CM

## 2015-01-09 DIAGNOSIS — M542 Cervicalgia: Secondary | ICD-10-CM | POA: Diagnosis not present

## 2015-01-09 DIAGNOSIS — M546 Pain in thoracic spine: Principal | ICD-10-CM

## 2015-01-09 NOTE — Therapy (Signed)
[place] [facility] [address], [place], [address] Phone: [phone]   Fax:  [phone]  Physical Therapy Treatment  Patient Details  Name: Shannon [name] MRN: [id number] Date of Birth: 09-26-1950 Referring Provider: Berle Mull  Encounter Date: 01/09/2015      PT End of Session - 01/09/15 1431    Visit Number 5   Number of Visits 13   Date for PT Re-Evaluation 02/04/15   Authorization Type g code   Authorization - Visit Number 5   PT Start Time 1340   PT Stop Time 1420   PT Time Calculation (min) 40 min   Activity Tolerance Patient tolerated treatment well   Behavior During Therapy [facility] for tasks assessed/performed      Past Medical History  Diagnosis Date  . GERD (gastroesophageal reflux disease)     with esophagitis  . Plantar fasciitis     left    Past Surgical History  Procedure Laterality Date  . Tubal ligation  1987    There were no vitals filed for this visit.  Visit Diagnosis:  Chronic thoracic spine pain      Subjective Assessment - 01/09/15 1343    Subjective Pt reports incr. pain experience over the past few days, related to specific activities such as cooking and wrapping presents on low table. Currently pt reports 0/10 pain.   Pertinent History Pt has several year history of shoulder pain. She had chiropractic treatment at that time which helped. She has had intermittent pain since that time. Pain worsened in October. Pt reports intermittent neck pain, particularly when reading.    How long can you stand comfortably? standing with hands in front, especially holding things.   Diagnostic tests imaging - DDD but no bone spurs noted in shoulder.   Patient Stated Goals to be able walk and work in her kitchen with no pain.          Objective: Trigger point dry needling performed on R UT, LS, R thoracic ILS muscles parallel to the ribcage.  CPAs grade IV T3-T8 3x45  sec.  Following this pt reported much easier time lifting arm overhead.  Reviewed cervical rotation HEP, mild pain at end range. Added pressure on UT which reduced pain, performed 3x10 with 3 sec. Holds.  Pelvic tilt education and performance. Initially pt had to bend knees to perform but able to straighten legs following this.  Performed 2# shoulder raise. Initially painful in shoulderblade region. Pain improved with cuing to pelvic tilt. Pain abolished with addition of TA contraction.  Overall pt made significant improvement with this education - encouraged pt to perform these exercises as HEP.                            PT Long Term Goals - 12/22/14 1044    PT LONG TERM GOAL #1   Title Pt will improve QuickDASH to less than 20/100 indicating self reported improvement in shoulder function   Baseline 38   Time 6   Period Weeks   Status New   PT LONG TERM GOAL #2   Title Pt will be able to stand at kitchen for 30 min with no pain in thoracic spine.   Baseline 5 min   Time 6   Period Weeks   Status New   PT LONG TERM GOAL #3   Title Pt will be able to amb. 2 miles pain  free to return to more normal exercise program   Baseline pt able to amb less than 1 mile.   Time 6   Period Weeks   Status New               Plan - 01/09/15 1433    Clinical Impression Statement Pt made definite progress with performance of posterior pelvic tilt and cuing for trunk activation with apendicular activities. Continues to have inconsistent c/o pain with poor posture, poor muscle activation, and overall muscle weakness.   Pt will benefit from skilled therapeutic intervention in order to improve on the following deficits Pain;Improper body mechanics;Postural dysfunction;Decreased range of motion;Difficulty walking;Decreased strength   Rehab Potential Good   Clinical Impairments Affecting Rehab Potential Ability to be educated, chronic pain   PT Frequency 2x / week   PT  Duration 6 weeks   PT Treatment/Interventions Dry needling;Functional mobility training;Therapeutic exercise;Manual techniques;ADLs/Self Care Home Management   PT Next Visit Plan load biceps tendons   Consulted and Agree with Plan of Care Patient        Problem List Patient Active Problem List   Diagnosis Date Noted  . UTI (urinary tract infection) 12/21/2014  . Right shoulder pain 12/02/2014  . Neck pain on right side 12/02/2014  . Thoracic back pain 12/02/2014  . Medicare annual wellness visit, initial 11/30/2014  . Routine general medical examination at a health care facility 11/05/2012  . Other screening mammogram 11/13/2011  . PLANTAR FASCIITIS, LEFT 07/28/2009  . NEOPLASM, SKIN, UNCERTAIN BEHAVIOR [id number]  . PERSONAL HX COLONIC POLYPS 11/02/2007  . ESOPHAGITIS, MILD 03/31/2007  . GERD 10/20/2006    Zamari Vea PT DPT 01/09/2015, 2:38 PM  [place] [facility] [address] [place], [address] Phone: [phone]   Fax:  [phone]  Name: PIA NANEZ MRN: [id number] Date of Birth: 1950/04/24

## 2015-01-18 ENCOUNTER — Ambulatory Visit: Payer: Medicare Other | Admitting: Physical Therapy

## 2015-01-18 DIAGNOSIS — M542 Cervicalgia: Secondary | ICD-10-CM | POA: Diagnosis not present

## 2015-01-18 DIAGNOSIS — G8929 Other chronic pain: Secondary | ICD-10-CM

## 2015-01-18 DIAGNOSIS — M546 Pain in thoracic spine: Principal | ICD-10-CM

## 2015-01-18 NOTE — Therapy (Signed)
[place] [facility] [address], [place], [address] Phone: [phone]   Fax:  [phone]  Physical Therapy Treatment  Patient Details  Name: Shannon [name] MRN: [id number] Date of Birth: [birth year] Referring Provider: Berle Mull  Encounter Date: 01/18/2015      PT End of Session - 01/18/15 1214    Visit Number 6   Number of Visits 13   Date for PT Re-Evaluation 02/04/15   Authorization Type g code   Authorization - Visit Number 5   PT Start Time [id number]   PT Stop Time 1200   PT Time Calculation (min) 45 min   Activity Tolerance Patient tolerated treatment well   Behavior During Therapy [facility] for tasks assessed/performed      Past Medical History  Diagnosis Date  . GERD (gastroesophageal reflux disease)     with esophagitis  . Plantar fasciitis     left    Past Surgical History  Procedure Laterality Date  . Tubal ligation  1987    There were no vitals filed for this visit.  Visit Diagnosis:  Chronic thoracic spine pain      Subjective Assessment - 01/18/15 1118    Subjective Pt reports incr. pain experience over the past few days, related to specific activities such as cooking and wrapping presents on low table. Currently pt reports 0/10 pain.   Pertinent History Pt has several year history of shoulder pain. She had chiropractic treatment at that time which helped. She has had intermittent pain since that time. Pain worsened in October. Pt reports intermittent neck pain, particularly when reading.    How long can you stand comfortably? standing with hands in front, especially holding things.   Diagnostic tests imaging - DDD but no bone spurs noted in shoulder.   Patient Stated Goals to be able walk and work in her kitchen with no pain.              Objective: Standing - pt reports she has her pain when she is cooking. Noted that with cooking pt presents with anterior pelvic tilt. When  cued to correct produces too much "work" for HS muscles.  Extensive education on how to achieve this, using relaxation, kegel, ADIM, pelvic lift anteriorly.  Seated pelvic rocking to achieve neutral pelvic positioning.  Same performed with cuing to draw hands back to aciheve scapular neutral. Then standing to maintain this position.  Assessed position when playing flute, modified via same education to minimize cervical protraction and lumbar lordosis.  Assessed standing desk posture, encouraged pt to get wireless keyboard so she is able to maintain improved scapular position while also maintaining ideal cervical posture.                   PT Education - 01/18/15 1213    Education provided Yes   Education Details educated on more specific postural control.   Person(s) Educated Patient   Methods Explanation   Comprehension Verbalized understanding             PT Long Term Goals - 12/22/14 1044    PT LONG TERM GOAL #1   Title Pt will improve QuickDASH to less than 20/100 indicating self reported improvement in shoulder function   Baseline 38   Time 6   Period Weeks   Status New   PT LONG TERM GOAL #2   Title Pt will be able to stand at kitchen for 30 min with no  pain in thoracic spine.   Baseline 5 min   Time 6   Period Weeks   Status New   PT LONG TERM GOAL #3   Title Pt will be able to amb. 2 miles pain free to return to more normal exercise program   Baseline pt able to amb less than 1 mile.   Time 6   Period Weeks   Status New               Plan - 01/18/15 1215    Clinical Impression Statement Pt appears to be pain free with activity modifications - so focus of session on maintaining posture with various activities. PT educated pt on returning to more upright activities to assess for presence of pain. modified posture withplaying flute and standing at desk.   Pt will benefit from skilled therapeutic intervention in order to improve on the  following deficits Pain;Improper body mechanics;Postural dysfunction;Decreased range of motion;Difficulty walking;Decreased strength   Rehab Potential Good   Clinical Impairments Affecting Rehab Potential Ability to be educated, chronic pain   PT Frequency 2x / week   PT Duration 6 weeks   PT Treatment/Interventions Dry needling;Functional mobility training;Therapeutic exercise;Manual techniques;ADLs/Self Care Home Management   PT Next Visit Plan load biceps tendons   Consulted and Agree with Plan of Care Patient        Problem List Patient Active Problem List   Diagnosis Date Noted  . UTI (urinary tract infection) 12/21/2014  . Right shoulder pain 12/02/2014  . Neck pain on right side 12/02/2014  . Thoracic back pain 12/02/2014  . Medicare annual wellness visit, initial 11/30/2014  . Routine general medical examination at a health care facility 11/05/2012  . Other screening mammogram 11/13/2011  . PLANTAR FASCIITIS, LEFT 07/28/2009  . NEOPLASM, SKIN, UNCERTAIN BEHAVIOR [id number]  . PERSONAL HX COLONIC POLYPS 11/02/2007  . ESOPHAGITIS, MILD 03/31/2007  . GERD 10/20/2006    Fisher,Benjamin PT DPT 01/18/2015, 12:18 PM  [place] [facility] [address] [place], [address] Phone: [phone]   Fax:  [phone]  Name: Shannon [name] MRN: [id number] Date of Birth: 11/15/1950

## 2015-01-19 ENCOUNTER — Ambulatory Visit: Payer: Medicare Other | Admitting: Physical Therapy

## 2015-01-19 DIAGNOSIS — M542 Cervicalgia: Secondary | ICD-10-CM | POA: Diagnosis not present

## 2015-01-19 DIAGNOSIS — G8929 Other chronic pain: Secondary | ICD-10-CM

## 2015-01-19 DIAGNOSIS — M546 Pain in thoracic spine: Principal | ICD-10-CM

## 2015-01-19 NOTE — Therapy (Signed)
[place] [facility] [address] [place], [address] Phone: [phone]   Fax:  [phone]  Physical Therapy Treatment  Patient Details  Name: Shannon [name] MRN: [id number] Date of Birth: December 07, 1950 Referring Provider: Berle Mull  Encounter Date: 01/19/2015      PT End of Session - 01/19/15 1241    Visit Number 7   Number of Visits 13   Date for PT Re-Evaluation 02/04/15   Authorization Type g code   Authorization - Visit Number 7   PT Start Time 1140   PT Stop Time 1210   PT Time Calculation (min) 30 min   Activity Tolerance Patient tolerated treatment well   Behavior During Therapy [facility] for tasks assessed/performed      Past Medical History  Diagnosis Date  . GERD (gastroesophageal reflux disease)     with esophagitis  . Plantar fasciitis     left    Past Surgical History  Procedure Laterality Date  . Tubal ligation  1987    There were no vitals filed for this visit.  Visit Diagnosis:  Chronic thoracic spine pain      Subjective Assessment - 01/19/15 1238    Subjective No pain today. Pt has multiple questions regarding exercises. She tried performing postural exercises last night which incr. her pain significantly - though sh erecognized that this was due to her holding her pain.   Pertinent History Pt has several year history of shoulder pain. She had chiropractic treatment at that time which helped. She has had intermittent pain since that time. Pain worsened in October. Pt reports intermittent neck pain, particularly when reading.    How long can you stand comfortably? standing with hands in front, especially holding things.   Diagnostic tests imaging - DDD but no bone spurs noted in shoulder.   Patient Stated Goals to be able walk and work in her kitchen with no pain.   Currently in Pain? No/denies            Objective:  prone CPAs grade IV T4-T9 3x1 min. UPAs on R in same  region.  Prone trigger point dry needling performed on UT, suboccipitals, levator scap. Multiple insertions with piston and twitch technique. Most response with lateral UT which produced significant LTR. Following this pt had full cervical ROM and reported she now has better cervical ROM than she has had in many years.  Reviewed HEP, educated pt on upper thoracic posture when she is performing her posterior pelvic tilts.                    PT Education - 01/18/15 1213    Education provided Yes   Education Details educated on more specific postural control.   Person(s) Educated Patient   Methods Explanation   Comprehension Verbalized understanding             PT Long Term Goals - 12/22/14 1044    PT LONG TERM GOAL #1   Title Pt will improve QuickDASH to less than 20/100 indicating self reported improvement in shoulder function   Baseline 38   Time 6   Period Weeks   Status New   PT LONG TERM GOAL #2   Title Pt will be able to stand at kitchen for 30 min with no pain in thoracic spine.   Baseline 5 min   Time 6   Period Weeks   Status New   PT LONG TERM GOAL #  3   Title Pt will be able to amb. 2 miles pain free to return to more normal exercise program   Baseline pt able to amb less than 1 mile.   Time 6   Period Weeks   Status New               Plan - 01/19/15 1242    Clinical Impression Statement thoracic mobilization with decr. pain and incr. ROM indicating improved tolerance for motion. Cervical and thoracic ROM are now WNL.   Pt will benefit from skilled therapeutic intervention in order to improve on the following deficits Pain;Improper body mechanics;Postural dysfunction;Decreased range of motion;Difficulty walking;Decreased strength   Rehab Potential Good   Clinical Impairments Affecting Rehab Potential Ability to be educated, chronic pain   PT Frequency 2x / week   PT Duration 6 weeks   PT Treatment/Interventions Dry needling;Functional  mobility training;Therapeutic exercise;Manual techniques;ADLs/Self Care Home Management   PT Next Visit Plan load biceps tendons   Consulted and Agree with Plan of Care Patient        Problem List Patient Active Problem List   Diagnosis Date Noted  . UTI (urinary tract infection) 12/21/2014  . Right shoulder pain 12/02/2014  . Neck pain on right side 12/02/2014  . Thoracic back pain 12/02/2014  . Medicare annual wellness visit, initial 11/30/2014  . Routine general medical examination at a health care facility 11/05/2012  . Other screening mammogram 11/13/2011  . PLANTAR FASCIITIS, LEFT 07/28/2009  . NEOPLASM, SKIN, UNCERTAIN BEHAVIOR [id number]  . PERSONAL HX COLONIC POLYPS 11/02/2007  . ESOPHAGITIS, MILD 03/31/2007  . GERD 10/20/2006    Fisher,Benjamin PT DPT 01/19/2015, 12:49 PM  [place] [facility] [address], [place], [address] Phone: [phone]   Fax:  [phone]  Name: Shannon [name] MRN: [id number] Date of Birth: 09/06/1950

## 2015-01-25 ENCOUNTER — Ambulatory Visit: Payer: Medicare Other | Attending: Sports Medicine | Admitting: Physical Therapy

## 2015-01-25 DIAGNOSIS — M546 Pain in thoracic spine: Secondary | ICD-10-CM | POA: Diagnosis present

## 2015-01-25 DIAGNOSIS — M25512 Pain in left shoulder: Secondary | ICD-10-CM | POA: Insufficient documentation

## 2015-01-25 DIAGNOSIS — M25511 Pain in right shoulder: Secondary | ICD-10-CM | POA: Insufficient documentation

## 2015-01-25 DIAGNOSIS — G8929 Other chronic pain: Secondary | ICD-10-CM | POA: Insufficient documentation

## 2015-01-25 NOTE — Therapy (Signed)
[place] [facility] [address] [place], [address] Phone: [phone]   Fax:  [phone]  Physical Therapy Treatment  Patient Details  Name: Shannon [name] MRN: [id number] Date of Birth: Sep 02, 1950 Referring Provider: Berle Mull  Encounter Date: 01/25/2015      PT End of Session - 01/25/15 0943    Visit Number 8   Number of Visits 13   Date for PT Re-Evaluation 02/04/15   Authorization Type g code   Authorization - Visit Number 7   PT Start Time 0903   PT Stop Time 0930   PT Time Calculation (min) 27 min   Activity Tolerance Patient tolerated treatment well   Behavior During Therapy [facility] for tasks assessed/performed      Past Medical History  Diagnosis Date  . GERD (gastroesophageal reflux disease)     with esophagitis  . Plantar fasciitis     left    Past Surgical History  Procedure Laterality Date  . Tubal ligation  1987    There were no vitals filed for this visit.  Visit Diagnosis:  Bilateral shoulder pain  Chronic thoracic spine pain      Subjective Assessment - 01/25/15 0909    Subjective No pain today, pt had some significant neck pain following previous session which resolved and now pt has no pain in neck. Has had inconsistent back pain but she is now able to modify this as needed. Inconsistent shoulder pain but not nearly as bad as it has been in the past.   Pertinent History Pt has several year history of shoulder pain. She had chiropractic treatment at that time which helped. She has had intermittent pain since that time. Pain worsened in October. Pt reports intermittent neck pain, particularly when reading.    Limitations Sitting;Walking   How long can you stand comfortably? standing with hands in front, especially holding things.   How long can you walk comfortably? walking for one mile produces back bain.   Diagnostic tests imaging - DDD but no bone spurs noted in shoulder.   Patient Stated Goals to be able walk and work in her kitchen with no pain.   Currently in Pain? No/denies           Objective: Prone CPAs grade IV 3x45 sec. T4-T7. Pt now c/o no pain with this.  Prone UPAs on R same grade/sets/reps. Significant pain initially with UPA on R at T6 so performed first bout at grade II - following this able to progress to grade IV. Pt reported 0/10 pain with palpation following manual intervention.  1x1 min global/rib mob throughout thoracic spine.  Educated pt on biceps isometric exercise to address pain with certain positions, also discontinued performance of standing ER with abduction exercise.  Performed 1# bicep at 90 isometric hold, 3x30 sec. Pain improved by doing this - no specific number given.  Same performed with 3# wt, issued 1# for first week at home, progressing to 3# by 3rd wk. Encouraged pt to be careful regarding neck pain with performance.                      PT Education - 01/25/15 [id number]    Education provided Yes   Education Details educated on biceps exercise to address continued light pain in region.   Person(s) Educated Patient   Methods Explanation   Comprehension Verbalized understanding  PT Long Term Goals - 12/22/14 1044    PT LONG TERM GOAL #1   Title Pt will improve QuickDASH to less than 20/100 indicating self reported improvement in shoulder function   Baseline 38   Time 6   Period Weeks   Status New   PT LONG TERM GOAL #2   Title Pt will be able to stand at kitchen for 30 min with no pain in thoracic spine.   Baseline 5 min   Time 6   Period Weeks   Status New   PT LONG TERM GOAL #3   Title Pt will be able to amb. 2 miles pain free to return to more normal exercise program   Baseline pt able to amb less than 1 mile.   Time 6   Period Weeks   Status New               Plan - 01/25/15 0944    Clinical Impression Statement Pt is now primarily pain free, has  inconsistent back pain which she is able to correct on her own. Has had several instances of biceps region pain primarily with playing the flute. Modified position, discontinued external rotation exercise, added isometric biceps exercise. Pt will be seen for followup in 2-3 wks to ensure she is maintaining improvement.   Pt will benefit from skilled therapeutic intervention in order to improve on the following deficits Pain;Improper body mechanics;Postural dysfunction;Decreased range of motion;Difficulty walking;Decreased strength   Rehab Potential Good   Clinical Impairments Affecting Rehab Potential Ability to be educated, chronic pain   PT Frequency 2x / week   PT Duration 6 weeks   PT Treatment/Interventions Dry needling;Functional mobility training;Therapeutic exercise;Manual techniques;ADLs/Self Care Home Management   PT Next Visit Plan load biceps tendons   Consulted and Agree with Plan of Care Patient        Problem List Patient Active Problem List   Diagnosis Date Noted  . UTI (urinary tract infection) 12/21/2014  . Right shoulder pain 12/02/2014  . Neck pain on right side 12/02/2014  . Thoracic back pain 12/02/2014  . Medicare annual wellness visit, initial 11/30/2014  . Routine general medical examination at a health care facility 11/05/2012  . Other screening mammogram 11/13/2011  . PLANTAR FASCIITIS, LEFT 07/28/2009  . NEOPLASM, SKIN, UNCERTAIN BEHAVIOR [id number]  . PERSONAL HX COLONIC POLYPS 11/02/2007  . ESOPHAGITIS, MILD 03/31/2007  . GERD 10/20/2006    Alvino Lechuga PT DPT 01/25/2015, 9:51 AM  [place] [facility] [address], [place], [address] Phone: [phone]   Fax:  [phone]  Name: Shannon [name] MRN: [id number] Date of Birth: 13-Dec-1950

## 2015-01-30 ENCOUNTER — Encounter: Payer: Medicare Other | Admitting: Physical Therapy

## 2015-02-13 ENCOUNTER — Ambulatory Visit: Payer: Medicare Other | Admitting: Physical Therapy

## 2015-02-13 DIAGNOSIS — M25511 Pain in right shoulder: Secondary | ICD-10-CM | POA: Diagnosis not present

## 2015-02-13 DIAGNOSIS — G8929 Other chronic pain: Secondary | ICD-10-CM

## 2015-02-13 DIAGNOSIS — M546 Pain in thoracic spine: Principal | ICD-10-CM

## 2015-02-13 NOTE — Therapy (Signed)
[facility] [address] [place], [address] Phone: [phone]   Fax:  [phone]  Physical Therapy Treatment  Patient Details  Name: Shannon [name] MRN: [id number] Date of Birth: 06/17/1950 Referring Provider: Berle Mull  Encounter Date: 02/13/2015      PT End of Session - 02/13/15 1043    Visit Number 9   Number of Visits 13   Date for PT Re-Evaluation 02/04/15   Authorization Type g code   Authorization - Visit Number 7   PT Start Time 1025   PT Stop Time 1055   PT Time Calculation (min) 30 min   Activity Tolerance Patient tolerated treatment well   Behavior During Therapy [facility] for tasks assessed/performed      Past Medical History  Diagnosis Date  . GERD (gastroesophageal reflux disease)     with esophagitis  . Plantar fasciitis     left    Past Surgical History  Procedure Laterality Date  . Tubal ligation  1987    There were no vitals filed for this visit.  Visit Diagnosis:  Chronic thoracic spine pain      Subjective Assessment - 02/13/15 1022    Subjective "Rarely having neck pain anymore, I'm able to manage it", reports "biceps exercise has improved pain", reports R "ankle sprain" on Wednesday, elevated and iced ankle with complete resolution within two days. Reports it is becoming more difficult to find the "sore spot" during STM w/tennis ball    Pertinent History Pt has several year history of shoulder pain. She had chiropractic treatment at that time which helped. She has had intermittent pain since that time. Pain worsened in October. Pt reports intermittent neck pain, particularly when reading.    Limitations Sitting;Walking   How long can you stand comfortably? standing with hands in front, especially holding things.   How long can you walk comfortably? walking for one mile produces back bain.   Diagnostic tests imaging - DDD but no bone spurs noted in shoulder.   Patient  Stated Goals to be able walk and work in her kitchen with no pain.            Objective: Reviewed pt HEP, made appropriate modifications including education on how to progress strengthening.  Had pt perform 3x5 isometric holds of scapular retraction with RTB. Pt had pain at 15 sec. So performed to 10 sec. Educated pt on performance of this in order to improve resilience and decr. Likelihood that pt will have future outbreaks of pain.  Pt had multiple questions regarding return to activities. Re-educated and had pt perform self mobilization/posterior pelvic tilts in different positions, standing, sitting, with activity. Pt demonstrated appropriate performance of this and is able to verbalize appropriate feelings.  PT encouraged pt to continue with modified HEP, to email if she has any questions.                       PT Education - 02/13/15 1031    Education provided Yes   Education Details Pt was educated to maintain self STM until she is no longer able to find a sore spot. Continue w/ modified HEP: arm swings, isometrics bicep curl, isometric scapular retraction w/ band,    Person(s) Educated Patient   Methods Explanation;Demonstration   Comprehension Verbalized understanding;Returned demonstration             PT Long Term Goals - 02/13/15 1100  PT LONG TERM GOAL #1   Title Pt will improve QuickDASH to less than 20/100 indicating self reported improvement in shoulder function   Baseline 38   Time 6   Period Weeks   Status Deferred   PT LONG TERM GOAL #2   Title Pt will be able to stand at kitchen for 30 min with no pain in thoracic spine.   Baseline 5 min   Time 6   Period Weeks   Status Achieved   PT LONG TERM GOAL #3   Title Pt will be able to amb. 2 miles pain free to return to more normal exercise program   Baseline pt able to amb less than 1 mile.   Time 6   Period Weeks   Status Achieved               Plan - 02/13/15 1045     Clinical Impression Statement Pt continues to have decreased pain and an increased ability to self regulate her activity and posture in order to avoid pain. Continue to perform HEP being aware of posture,  and progressing to greeen band resistance for scapular retractions as needed. At this time pt is approprate for d/c as she has met goals, is able to manage pain on her own, and has resumed all normal activities pain free.   Pt will benefit from skilled therapeutic intervention in order to improve on the following deficits Pain;Improper body mechanics;Postural dysfunction;Decreased range of motion;Difficulty walking;Decreased strength   Rehab Potential Good   Clinical Impairments Affecting Rehab Potential Ability to be educated, chronic pain   PT Frequency 2x / week   PT Duration 6 weeks   PT Treatment/Interventions Dry needling;Functional mobility training;Therapeutic exercise;Manual techniques;ADLs/Self Care Home Management   PT Next Visit Plan load biceps tendons   Consulted and Agree with Plan of Care Patient        Problem List Patient Active Problem List   Diagnosis Date Noted  . UTI (urinary tract infection) 12/21/2014  . Right shoulder pain 12/02/2014  . Neck pain on right side 12/02/2014  . Thoracic back pain 12/02/2014  . Medicare annual wellness visit, initial 11/30/2014  . Routine general medical examination at a health care facility 11/05/2012  . Other screening mammogram 11/13/2011  . PLANTAR FASCIITIS, LEFT 07/28/2009  . NEOPLASM, SKIN, UNCERTAIN BEHAVIOR 69/62/9528  . PERSONAL HX COLONIC POLYPS 11/02/2007  . ESOPHAGITIS, MILD 03/31/2007  . GERD 10/20/2006    Vinson Moselle Rij SPT 02/13/2015, 11:03 AM  Mont Dutton PT DPT  [facility] [address], [place], [address] Phone: [phone]   Fax:  [phone]  Name: Shannon [name] MRN: [id number] Date of Birth: Jun 22, 1950

## 2015-03-27 ENCOUNTER — Telehealth: Payer: Self-pay | Admitting: Family Medicine

## 2015-03-27 DIAGNOSIS — N814 Uterovaginal prolapse, unspecified: Secondary | ICD-10-CM | POA: Insufficient documentation

## 2015-03-27 NOTE — Telephone Encounter (Signed)
Referral done

## 2015-03-27 NOTE — Telephone Encounter (Signed)
Patient said she spoke to Dr.Tower about being referred to a gynecologist at her last physical. Patient has a prolapsed uterus. Patient said she would like to be referred to a gynecologist.  Patient would like a female gynecologist.  She prefers [facility].  Patient said an appointment on Monday, Wednesday, or Friday is best.

## 2015-04-12 ENCOUNTER — Encounter: Payer: Self-pay | Admitting: Obstetrics & Gynecology

## 2015-04-12 ENCOUNTER — Ambulatory Visit (INDEPENDENT_AMBULATORY_CARE_PROVIDER_SITE_OTHER): Payer: Medicare Other | Admitting: Obstetrics & Gynecology

## 2015-04-12 VITALS — BP 100/59 | HR 67 | Resp 18 | Ht 70.0 in | Wt 159.0 lb

## 2015-04-12 DIAGNOSIS — N813 Complete uterovaginal prolapse: Secondary | ICD-10-CM | POA: Insufficient documentation

## 2015-04-12 DIAGNOSIS — R32 Unspecified urinary incontinence: Secondary | ICD-10-CM | POA: Diagnosis not present

## 2015-04-12 NOTE — Progress Notes (Signed)
   Subjective:    Patient ID: KAYLIANN HRNCIR, female    DOB: [birth year], 65 y.o.   MRN: [id number]  HPI  65 yo MW lady here for a 6 month h/o uterine prolapse, much worse over the last 3 months, now she can see her cervix. It is not painful, but is very uncomfortable and annoying. She is sexually active. She has a h/o mixed incontinence for about 20+ years, wears pads daily, more stress and urge incontinence.  Review of Systems     Objective:   Physical Exam WNWHtall thin pleasant WF Breathing, conversing, and ambulating normally She has a grade3/4 uterine prolapse but amazingly no rectocele and only a grade 1 cystocele Normal bimanual exam I fitted her with a #4 ring. It relieved her symptoms, did not fall out, and caused her no discomfort.       Assessment & Plan:  Symptomatic uterine prolapse- we discussed TVH/BSO versus pessary.  At this time, she opts for a pessary. It will be ordered and she will be notified when it is in. Mixed incontinence. We discussed options including meds for UI versus sling for GSUI.

## 2015-04-20 ENCOUNTER — Encounter: Payer: Self-pay | Admitting: Obstetrics & Gynecology

## 2015-04-20 ENCOUNTER — Ambulatory Visit (INDEPENDENT_AMBULATORY_CARE_PROVIDER_SITE_OTHER): Payer: Medicare Other | Admitting: Obstetrics & Gynecology

## 2015-04-20 VITALS — BP 106/64 | HR 60 | Resp 16 | Ht 70.0 in | Wt 160.0 lb

## 2015-04-20 DIAGNOSIS — R32 Unspecified urinary incontinence: Secondary | ICD-10-CM | POA: Diagnosis not present

## 2015-04-20 DIAGNOSIS — N813 Complete uterovaginal prolapse: Secondary | ICD-10-CM | POA: Diagnosis not present

## 2015-04-20 NOTE — Progress Notes (Signed)
   Subjective:    Patient ID: Shannon [name], female    DOB: 08-03-1950, 65 y.o.   MRN: [id number]  HPI  65 yo lady here to get her #4 ring pessary.  Review of Systems     Objective:   Physical Exam She was able to remove and replace the pessary.  It did not cause her pain and it resolved her prolapse.       Assessment & Plan:  Uterine prolapse, Mixed incontinence RTC 3 months for pessary check

## 2015-04-27 ENCOUNTER — Ambulatory Visit (INDEPENDENT_AMBULATORY_CARE_PROVIDER_SITE_OTHER): Payer: Medicare Other | Admitting: Obstetrics & Gynecology

## 2015-04-27 ENCOUNTER — Encounter: Payer: Self-pay | Admitting: Obstetrics & Gynecology

## 2015-04-27 VITALS — BP 111/68 | HR 72 | Resp 16

## 2015-04-27 DIAGNOSIS — N813 Complete uterovaginal prolapse: Secondary | ICD-10-CM | POA: Diagnosis not present

## 2015-04-27 DIAGNOSIS — R32 Unspecified urinary incontinence: Secondary | ICD-10-CM | POA: Diagnosis not present

## 2015-04-27 NOTE — Progress Notes (Signed)
   Subjective:    Patient ID: Shannon [name], female    DOB: 1950/07/11, 65 y.o.   MRN: [id number]  HPI 65 yo lady here for followup after placement of #4 ring pessary for uterine prolapse, cystocele. She reports that her GSUI has gotten significantly worse with the pessary although it completely relieves the prolapse.   Review of Systems     Objective:   Physical Exam [id number]       Assessment & Plan:  Combined prolapse and mixed incontinence She is now wanting a permanent repair, We discussed TVH/BSO/anterior repair/TVT sling.

## 2015-05-09 ENCOUNTER — Encounter (HOSPITAL_COMMUNITY): Payer: Self-pay | Admitting: *Deleted

## 2015-06-20 NOTE — Patient Instructions (Addendum)
Your procedure is scheduled on:  Tuesday, July 04, 2015  Enter through the Main Entrance of [facility] at:  6:00 AM  Pick up the phone at the desk and dial [phone].  Call this number if you have problems the morning of surgery: [phone].  Remember:  Do NOT eat food or drink after:  Midnight Monday, July 03, 2015  Take these medicines the morning of surgery with a SIP OF WATER:  Pepcid  Stop taking St. John's wort at this time.  Do NOT wear jewelry (body piercing), metal hair clips/bobby pins, make-up, or nail polish. Do NOT wear lotions, powders, or perfumes.  You may wear deodorant. Do NOT shave for 48 hours prior to surgery. Do NOT bring valuables to the hospital. Contacts, dentures, or bridgework may not be worn into surgery.  Leave suitcase in car.  After surgery it may be brought to your room.  For patients admitted to the hospital, checkout time is 11:00 AM the day of discharge.  Bring a copy of health care power of attorney/living will documentation day of surgery.

## 2015-06-21 ENCOUNTER — Encounter (HOSPITAL_COMMUNITY)
Admission: RE | Admit: 2015-06-21 | Discharge: 2015-06-21 | Disposition: A | Discharge status: Home / Self Care | Payer: Medicare Other | Source: Ambulatory Visit | Attending: Obstetrics & Gynecology | Admitting: Obstetrics & Gynecology

## 2015-06-21 ENCOUNTER — Encounter (HOSPITAL_COMMUNITY): Payer: Self-pay

## 2015-06-21 ENCOUNTER — Other Ambulatory Visit: Payer: Self-pay

## 2015-06-21 DIAGNOSIS — Z01810 Encounter for preprocedural cardiovascular examination: Secondary | ICD-10-CM | POA: Diagnosis not present

## 2015-06-21 DIAGNOSIS — Z01812 Encounter for preprocedural laboratory examination: Secondary | ICD-10-CM | POA: Insufficient documentation

## 2015-06-21 HISTORY — DX: Anesthesia of skin: R20.0

## 2015-06-21 HISTORY — DX: Other chronic pain: G89.29

## 2015-06-21 HISTORY — DX: Dorsalgia, unspecified: M54.2

## 2015-06-21 HISTORY — DX: Dorsalgia, unspecified: M54.9

## 2015-06-21 LAB — CBC
HCT: 39 % (ref 36.0–46.0)
HEMOGLOBIN: 12.9 g/dL (ref 12.0–15.0)
MCH: 29.6 pg (ref 26.0–34.0)
MCHC: 33.1 g/dL (ref 30.0–36.0)
MCV: 89.4 fL (ref 78.0–100.0)
Platelets: 225 10*3/uL (ref 150–400)
RBC: 4.36 MIL/uL (ref 3.87–5.11)
RDW: 13 % (ref 11.5–15.5)
WBC: 4.2 10*3/uL (ref 4.0–10.5)

## 2015-07-03 NOTE — Anesthesia Preprocedure Evaluation (Addendum)
Anesthesia Evaluation  Patient identified by MRN, date of birth, ID band Patient awake    Reviewed: Allergy & Precautions, NPO status , Patient's Chart, lab work & pertinent test results  Airway Mallampati: II  TM Distance: >3 FB Neck ROM: Full    Dental  (+) Dental Advisory Given   Pulmonary neg pulmonary ROS,    breath sounds clear to auscultation       Cardiovascular negative cardio ROS   Rhythm:Regular Rate:Normal     Neuro/Psych negative neurological ROS     GI/Hepatic Neg liver ROS, GERD  ,  Endo/Other  negative endocrine ROS  Renal/GU negative Renal ROS     Musculoskeletal   Abdominal   Peds  Hematology negative hematology ROS (+)   Anesthesia Other Findings   Reproductive/Obstetrics                            Lab Results  Component Value Date   WBC 4.2 06/21/2015   HGB 12.9 06/21/2015   HCT 39.0 06/21/2015   MCV 89.4 06/21/2015   PLT 225 06/21/2015   Lab Results  Component Value Date   CREATININE 0.87 11/25/2014   BUN 29* 11/25/2014   NA 141 11/25/2014   K 4.5 11/25/2014   CL 103 11/25/2014   CO2 33* 11/25/2014    Anesthesia Physical Anesthesia Plan  ASA: II  Anesthesia Plan: Spinal and MAC   Post-op Pain Management:    Induction: Intravenous  Airway Management Planned: Natural Airway and Simple Face Mask  Additional Equipment:   Intra-op Plan:   Post-operative Plan: Extubation in OR  Informed Consent: I have reviewed the patients History and Physical, chart, labs and discussed the procedure including the risks, benefits and alternatives for the proposed anesthesia with the patient or authorized representative who has indicated his/her understanding and acceptance.     Plan Discussed with: CRNA  Anesthesia Plan Comments:        Anesthesia Quick Evaluation

## 2015-07-04 ENCOUNTER — Encounter (HOSPITAL_COMMUNITY): Payer: Self-pay

## 2015-07-04 ENCOUNTER — Observation Stay (HOSPITAL_COMMUNITY)
Admission: RE | Admit: 2015-07-04 | Discharge: 2015-07-05 | Disposition: A | Discharge status: Home / Self Care | Payer: Medicare Other | Source: Ambulatory Visit | Attending: Obstetrics & Gynecology | Admitting: Obstetrics & Gynecology

## 2015-07-04 ENCOUNTER — Ambulatory Visit (HOSPITAL_COMMUNITY): Payer: Medicare Other | Admitting: Anesthesiology

## 2015-07-04 ENCOUNTER — Encounter (HOSPITAL_COMMUNITY)
Admission: RE | Disposition: A | Discharge status: Home / Self Care | Payer: Self-pay | Source: Ambulatory Visit | Attending: Obstetrics & Gynecology

## 2015-07-04 DIAGNOSIS — N888 Other specified noninflammatory disorders of cervix uteri: Secondary | ICD-10-CM | POA: Insufficient documentation

## 2015-07-04 DIAGNOSIS — N814 Uterovaginal prolapse, unspecified: Secondary | ICD-10-CM | POA: Diagnosis not present

## 2015-07-04 DIAGNOSIS — K219 Gastro-esophageal reflux disease without esophagitis: Secondary | ICD-10-CM | POA: Diagnosis not present

## 2015-07-04 DIAGNOSIS — Z9889 Other specified postprocedural states: Secondary | ICD-10-CM

## 2015-07-04 DIAGNOSIS — N993 Prolapse of vaginal vault after hysterectomy: Secondary | ICD-10-CM | POA: Diagnosis not present

## 2015-07-04 DIAGNOSIS — N3946 Mixed incontinence: Secondary | ICD-10-CM | POA: Diagnosis not present

## 2015-07-04 HISTORY — PX: VAGINAL HYSTERECTOMY: SHX2639

## 2015-07-04 HISTORY — PX: CYSTOCELE REPAIR: SHX163

## 2015-07-04 HISTORY — PX: CYSTOSCOPY: SHX5120

## 2015-07-04 HISTORY — PX: BLADDER SUSPENSION: SHX72

## 2015-07-04 SURGERY — HYSTERECTOMY, VAGINAL
Anesthesia: Monitor Anesthesia Care | Site: Vagina

## 2015-07-04 MED ORDER — MIDAZOLAM HCL 2 MG/2ML IJ SOLN
INTRAMUSCULAR | Status: AC
  Filled 2015-07-04: qty 2

## 2015-07-04 MED ORDER — KETOROLAC TROMETHAMINE 30 MG/ML IJ SOLN
INTRAMUSCULAR | Status: AC
  Filled 2015-07-04: qty 1

## 2015-07-04 MED ORDER — ONDANSETRON HCL 4 MG/2ML IJ SOLN
INTRAMUSCULAR | Status: AC
  Filled 2015-07-04: qty 2

## 2015-07-04 MED ORDER — KETOROLAC TROMETHAMINE 30 MG/ML IJ SOLN: 30.0000 mg | Freq: Four times a day (QID) | INTRAMUSCULAR | Status: AC | PRN

## 2015-07-04 MED ORDER — LIDOCAINE HCL (CARDIAC) 20 MG/ML IV SOLN
INTRAVENOUS | Status: DC | PRN
  Administered 2015-07-04: 50 mg via INTRAVENOUS

## 2015-07-04 MED ORDER — BUPIVACAINE HCL (PF) 0.5 % IJ SOLN
INTRAMUSCULAR | Status: AC
  Filled 2015-07-04: qty 30

## 2015-07-04 MED ORDER — SCOPOLAMINE 1 MG/3DAYS TD PT72: 1.0000 | MEDICATED_PATCH | Freq: Once | TRANSDERMAL | Status: DC

## 2015-07-04 MED ORDER — PROPOFOL 10 MG/ML IV BOLUS
INTRAVENOUS | Status: AC
  Filled 2015-07-04: qty 20

## 2015-07-04 MED ORDER — ONDANSETRON HCL 4 MG/2ML IJ SOLN
INTRAMUSCULAR | Status: DC | PRN
  Administered 2015-07-04: 4 mg via INTRAVENOUS

## 2015-07-04 MED ORDER — DIPHENHYDRAMINE HCL 50 MG/ML IJ SOLN: 12.5000 mg | INTRAMUSCULAR | Status: DC | PRN

## 2015-07-04 MED ORDER — NALBUPHINE HCL 10 MG/ML IJ SOLN: 5.0000 mg | Freq: Once | INTRAMUSCULAR | Status: DC | PRN

## 2015-07-04 MED ORDER — BUPIVACAINE-EPINEPHRINE (PF) 0.5% -1:200000 IJ SOLN
INTRAMUSCULAR | Status: AC
  Filled 2015-07-04: qty 60

## 2015-07-04 MED ORDER — NALOXONE HCL 0.4 MG/ML IJ SOLN: 0.4000 mg | INTRAMUSCULAR | Status: DC | PRN

## 2015-07-04 MED ORDER — MORPHINE SULFATE (PF) 0.5 MG/ML IJ SOLN
INTRAMUSCULAR | Status: DC | PRN
  Administered 2015-07-04: .2 mg via EPIDURAL

## 2015-07-04 MED ORDER — FENTANYL CITRATE (PF) 100 MCG/2ML IJ SOLN
INTRAMUSCULAR | Status: DC | PRN
  Administered 2015-07-04: 10 ug via INTRATHECAL

## 2015-07-04 MED ORDER — PROPOFOL 10 MG/ML IV BOLUS
INTRAVENOUS | Status: DC | PRN
Start: 2015-07-04 — End: 2015-07-04
  Administered 2015-07-04: 20 mg via INTRAVENOUS

## 2015-07-04 MED ORDER — PROMETHAZINE HCL 25 MG/ML IJ SOLN
6.2500 mg | Freq: Once | INTRAMUSCULAR | Status: AC
  Administered 2015-07-04: 6.25 mg via INTRAVENOUS

## 2015-07-04 MED ORDER — HYDROMORPHONE HCL 1 MG/ML IJ SOLN: 0.2000 mg | INTRAMUSCULAR | Status: DC | PRN

## 2015-07-04 MED ORDER — NALBUPHINE HCL 10 MG/ML IJ SOLN: 5.0000 mg | INTRAMUSCULAR | Status: DC | PRN

## 2015-07-04 MED ORDER — DEXAMETHASONE SODIUM PHOSPHATE 4 MG/ML IJ SOLN
INTRAMUSCULAR | Status: DC | PRN
  Administered 2015-07-04: 4 mg via INTRAVENOUS

## 2015-07-04 MED ORDER — ONDANSETRON HCL 4 MG PO TABS: 4.0000 mg | ORAL_TABLET | Freq: Four times a day (QID) | ORAL | Status: DC | PRN

## 2015-07-04 MED ORDER — PROPOFOL 500 MG/50ML IV EMUL
INTRAVENOUS | Status: DC | PRN
  Administered 2015-07-04: 100 ug/kg/min via INTRAVENOUS
  Administered 2015-07-04: 50 ug/kg/min via INTRAVENOUS
  Administered 2015-07-04: 75 ug/kg/min via INTRAVENOUS

## 2015-07-04 MED ORDER — OXYCODONE-ACETAMINOPHEN 5-325 MG PO TABS: 1.0000 | ORAL_TABLET | ORAL | Status: DC | PRN

## 2015-07-04 MED ORDER — ONDANSETRON HCL 4 MG/2ML IJ SOLN: 4.0000 mg | Freq: Three times a day (TID) | INTRAMUSCULAR | Status: DC | PRN

## 2015-07-04 MED ORDER — NALOXONE HCL 2 MG/2ML IJ SOSY: 1.0000 ug/kg/h | PREFILLED_SYRINGE | INTRAVENOUS | Status: DC | PRN

## 2015-07-04 MED ORDER — SCOPOLAMINE 1 MG/3DAYS TD PT72
1.0000 | MEDICATED_PATCH | Freq: Once | TRANSDERMAL | Status: DC
  Administered 2015-07-04: 1.5 mg via TRANSDERMAL

## 2015-07-04 MED ORDER — LACTATED RINGERS IV SOLN: INTRAVENOUS | Status: DC

## 2015-07-04 MED ORDER — SODIUM CHLORIDE 0.9 % IJ SOLN
INTRAMUSCULAR | Status: AC
  Filled 2015-07-04: qty 200

## 2015-07-04 MED ORDER — CEFAZOLIN SODIUM-DEXTROSE 2-4 GM/100ML-% IV SOLN
2.0000 g | INTRAVENOUS | Status: AC
  Administered 2015-07-04: 2 g via INTRAVENOUS

## 2015-07-04 MED ORDER — FENTANYL CITRATE (PF) 250 MCG/5ML IJ SOLN
INTRAMUSCULAR | Status: AC
  Filled 2015-07-04: qty 5

## 2015-07-04 MED ORDER — MIDAZOLAM HCL 5 MG/5ML IJ SOLN
INTRAMUSCULAR | Status: DC | PRN
  Administered 2015-07-04 (×2): 1 mg via INTRAVENOUS

## 2015-07-04 MED ORDER — MEPERIDINE HCL 25 MG/ML IJ SOLN: 6.2500 mg | INTRAMUSCULAR | Status: DC | PRN

## 2015-07-04 MED ORDER — PHENYLEPHRINE HCL 10 MG/ML IJ SOLN
INTRAMUSCULAR | Status: AC
  Filled 2015-07-04: qty 1

## 2015-07-04 MED ORDER — SCOPOLAMINE 1 MG/3DAYS TD PT72
MEDICATED_PATCH | TRANSDERMAL | Status: AC
  Filled 2015-07-04: qty 1

## 2015-07-04 MED ORDER — BUPIVACAINE HCL (PF) 0.5 % IJ SOLN
INTRAMUSCULAR | Status: DC | PRN
  Administered 2015-07-04: 86 mL

## 2015-07-04 MED ORDER — SODIUM CHLORIDE 0.9% FLUSH: 3.0000 mL | INTRAVENOUS | Status: DC | PRN

## 2015-07-04 MED ORDER — DEXAMETHASONE SODIUM PHOSPHATE 4 MG/ML IJ SOLN
INTRAMUSCULAR | Status: AC
  Filled 2015-07-04: qty 1

## 2015-07-04 MED ORDER — ONDANSETRON HCL 4 MG/2ML IJ SOLN
4.0000 mg | Freq: Four times a day (QID) | INTRAMUSCULAR | Status: DC | PRN
Start: 2015-07-04 — End: 2015-07-05

## 2015-07-04 MED ORDER — STERILE WATER FOR IRRIGATION IR SOLN
Status: DC | PRN
  Administered 2015-07-04: 1000 mL via INTRAVESICAL

## 2015-07-04 MED ORDER — SODIUM CHLORIDE 0.9 % IJ SOLN
INTRAMUSCULAR | Status: DC | PRN
  Administered 2015-07-04: 100 mL

## 2015-07-04 MED ORDER — SUGAMMADEX SODIUM 200 MG/2ML IV SOLN
INTRAVENOUS | Status: AC
  Filled 2015-07-04: qty 2

## 2015-07-04 MED ORDER — LACTATED RINGERS IV SOLN
INTRAVENOUS | Status: DC
  Administered 2015-07-04: 125 mL/h via INTRAVENOUS
  Administered 2015-07-04 (×2): via INTRAVENOUS

## 2015-07-04 MED ORDER — IBUPROFEN 800 MG PO TABS
800.0000 mg | ORAL_TABLET | Freq: Three times a day (TID) | ORAL | Status: DC | PRN
  Administered 2015-07-05: 800 mg via ORAL
  Filled 2015-07-04: qty 1

## 2015-07-04 MED ORDER — BUPIVACAINE IN DEXTROSE 0.75-8.25 % IT SOLN
INTRATHECAL | Status: DC | PRN
  Administered 2015-07-04: 2 mL via INTRATHECAL

## 2015-07-04 MED ORDER — DIPHENHYDRAMINE HCL 25 MG PO CAPS
25.0000 mg | ORAL_CAPSULE | ORAL | Status: DC | PRN
Start: 2015-07-04 — End: 2015-07-05

## 2015-07-04 MED ORDER — FLAVOXATE HCL 100 MG PO TABS
100.0000 mg | ORAL_TABLET | Freq: Three times a day (TID) | ORAL | Status: DC | PRN
Start: 2015-07-04 — End: 2015-07-05
  Filled 2015-07-04: qty 1

## 2015-07-04 MED ORDER — MORPHINE SULFATE (PF) 0.5 MG/ML IJ SOLN
INTRAMUSCULAR | Status: AC
  Filled 2015-07-04: qty 10

## 2015-07-04 MED ORDER — PROMETHAZINE HCL 25 MG/ML IJ SOLN
INTRAMUSCULAR | Status: AC
  Administered 2015-07-04: 6.25 mg via INTRAVENOUS
  Filled 2015-07-04: qty 1

## 2015-07-04 MED ORDER — LIDOCAINE HCL (PF) 1 % IJ SOLN
INTRAMUSCULAR | Status: AC
  Filled 2015-07-04: qty 5

## 2015-07-04 SURGICAL SUPPLY — 48 items
BLADE SURG 10 STRL SS (BLADE) ×10 IMPLANT
BLADE SURG 11 STRL SS (BLADE) ×5 IMPLANT
BLADE SURG 15 STRL LF C SS BP (BLADE) ×3 IMPLANT
BLADE SURG 15 STRL SS (BLADE) ×5
CANISTER SUCT 3000ML (MISCELLANEOUS) ×5 IMPLANT
CATH FOLEY 2WAY SLVR  5CC 18FR (CATHETERS) ×2
CATH FOLEY 2WAY SLVR 5CC 18FR (CATHETERS) ×3 IMPLANT
CLOTH BEACON ORANGE TIMEOUT ST (SAFETY) ×5 IMPLANT
CONT PATH 16OZ SNAP LID 3702 (MISCELLANEOUS) ×5 IMPLANT
DECANTER SPIKE VIAL GLASS SM (MISCELLANEOUS) ×4 IMPLANT
GLOVE BIO SURGEON STRL SZ 6.5 (GLOVE) ×4 IMPLANT
GLOVE BIO SURGEONS STRL SZ 6.5 (GLOVE) ×1
GLOVE BIOGEL PI IND STRL 6.5 (GLOVE) ×3 IMPLANT
GLOVE BIOGEL PI IND STRL 7.0 (GLOVE) ×3 IMPLANT
GLOVE BIOGEL PI INDICATOR 6.5 (GLOVE) ×2
GLOVE BIOGEL PI INDICATOR 7.0 (GLOVE) ×2
GOWN STRL REUS W/TWL LRG LVL3 (GOWN DISPOSABLE) ×20 IMPLANT
HEMOSTAT SURGICEL 2X3 (HEMOSTASIS) IMPLANT
LIQUID BAND (GAUZE/BANDAGES/DRESSINGS) ×5 IMPLANT
NDL MAYO CATGUT SZ4 TPR NDL (NEEDLE) ×2 IMPLANT
NDL SPNL 18GX3.5 QUINCKE PK (NEEDLE) ×2 IMPLANT
NEEDLE HYPO 22GX1.5 SAFETY (NEEDLE) IMPLANT
NEEDLE MAYO CATGUT SZ4 (NEEDLE) ×5 IMPLANT
NEEDLE SPNL 18GX3.5 QUINCKE PK (NEEDLE) ×5 IMPLANT
NS IRRIG 1000ML POUR BTL (IV SOLUTION) ×5 IMPLANT
PACK VAGINAL WOMENS (CUSTOM PROCEDURE TRAY) ×5 IMPLANT
PAD OB MATERNITY 4.3X12.25 (PERSONAL CARE ITEMS) ×5 IMPLANT
SET CYSTO W/LG BORE CLAMP LF (SET/KITS/TRAYS/PACK) ×5 IMPLANT
SHEARS FOC LG CVD HARMONIC 17C (MISCELLANEOUS) IMPLANT
SLING TVT EXACT (Sling) ×3 IMPLANT
SUT CHROMIC 0 SH (SUTURE) IMPLANT
SUT CHROMIC 2 0 SH (SUTURE) IMPLANT
SUT VIC AB 0 CT1 27 (SUTURE)
SUT VIC AB 0 CT1 27XBRD ANBCTR (SUTURE) IMPLANT
SUT VIC AB 0 CT1 36 (SUTURE) IMPLANT
SUT VIC AB 2-0 CT1 (SUTURE) ×3 IMPLANT
SUT VIC AB 2-0 CT1 18 (SUTURE) ×12 IMPLANT
SUT VIC AB 2-0 CT1 27 (SUTURE) ×10
SUT VIC AB 2-0 CT1 TAPERPNT 27 (SUTURE) ×6 IMPLANT
SUT VIC AB 2-0 CT2 27 (SUTURE) ×3 IMPLANT
SUT VIC AB 2-0 SH 27 (SUTURE)
SUT VIC AB 2-0 SH 27XBRD (SUTURE) IMPLANT
SUT VIC AB 4-0 SH 27 (SUTURE) ×5
SUT VIC AB 4-0 SH 27XANBCTRL (SUTURE) ×5 IMPLANT
SUT VICRYL 0 TIES 12 18 (SUTURE) ×3 IMPLANT
TOWEL OR 17X24 6PK STRL BLUE (TOWEL DISPOSABLE) ×10 IMPLANT
TRAY FOLEY CATH SILVER 14FR (SET/KITS/TRAYS/PACK) ×5 IMPLANT
WATER STERILE IRR 1000ML POUR (IV SOLUTION) ×2 IMPLANT

## 2015-07-04 NOTE — Anesthesia Postprocedure Evaluation (Signed)
Anesthesia Post Note  Patient: Taniqua Pettinger Oki  Procedure(s) Performed: Procedure(s) (LRB): TOTAL VAGINAL HYSTERECTOMY   (N/A) ANTERIOR REPAIR (CYSTOCELE) (N/A) TRANSVAGINAL TAPE (TVT) SLING                    (N/A) CYSTOSCOPY (N/A)  Patient location during evaluation: [facility] Anesthesia Type: General Level of consciousness: awake and alert Pain management: pain level controlled Vital Signs Assessment: post-procedure vital signs reviewed and stable Respiratory status: spontaneous breathing, nonlabored ventilation, respiratory function stable and patient connected to nasal cannula oxygen Cardiovascular status: blood pressure returned to baseline and stable Postop Assessment: no signs of nausea or vomiting Anesthetic complications: no     Last Vitals:  Filed Vitals:   07/04/15 1134 07/04/15 1236  BP: 126/62 110/56  Pulse: 47 55  Temp: 36.4 C 36.8 C  Resp: 16 16    Last Pain: There were no vitals filed for this visit. Pain Goal: Patients Stated Pain Goal: Other (Comment) (asleep) (07/04/15 1353)               Riki Sheer

## 2015-07-04 NOTE — Op Note (Signed)
07/04/2015  9:53 AM  PATIENT:  Shannon [name]  65 y.o. female  PRE-OPERATIVE DIAGNOSIS:  UTERINE PROLAPSE, CYSTOCELE, AND MIXED INCONTINENCE   POST-OPERATIVE DIAGNOSIS:  SAME  PROCEDURE:   TOTAL VAGINAL HYSTERECTOMY MIDURETHRAL SLING CYSTOSCOPY ANTERIOR REPAIR  SURGEON:  Surgeon(s) and Role:    * Emily Filbert, MD - Primary    * Mora Bellman, MD - Assisting  ANESTHESIA:   spinal  EBL:  Total I/O In: 1500 [I.V.:1500] Out: 650 [Urine:550; Blood:100]  BLOOD ADMINISTERED:none  DRAINS: none   LOCAL MEDICATIONS USED:  MARCAINE     SPECIMEN:  Source of Specimen:  uterus  DISPOSITION OF SPECIMEN:  PATHOLOGY  COUNTS:  YES  TOURNIQUET:  * No tourniquets in log *  DICTATION: .Dragon Dictation  PLAN OF CARE: Admit for overnight observation  PATIENT DISPOSITION:  PACU - hemodynamically stable.   Delay start of Pharmacological VTE agent (>24hrs) due to surgical blood loss or risk of bleeding: not applicable  The risks, benefits, alternatives of surgery were explained, understood, and accepted. All questions were answered and a consent form was signed. She was taken to the operating room and spinal anesthesia was applied. She was put in the dorsal lithotomy position. Her vagina and abdomen were prepped and draped in the usual sterile fashion. A timeout procedure was done. A bimanual exam revealed a small prolapsed uterus and a Grade 3 cystocele A Foley catheter was placed and it drained clear urine throughout the case. The cervix was grasped with a single-tooth tenaculum. A total of 60 cc of dilute Marcaine was injected in a circumferential fashion at the cervicovaginal junction. An incision was made at the site. The posterior peritoneum was entered. A long weighted speculum was placed. The anterior peritoneum was entered. A Deaver was placed anteriorly. The uterosacral ligaments were clamped, cut, and ligated. They were tagged and held. 2 Vicryl sutures used throughout this case  unless otherwise specified. The small uterus was separated from its pelvic attachments using a similar clamp, cut, ligate technique. Excellent hemostasis was maintained throughout. Once the uterus was removed, the bowel was kept out of the operative site with a sponge on a stick. I was able to feel the very small adnexa but they were plastered against the sidewalls and not amenable to removal. I used the tags on the uterosacral ligments to incorportate these into the vaginal angles for vaginal support. I then proceeded with the anterior repair. I injected another 30 cc of the dilute marcaine solution into the midline of the anterior vaginal mucosa. I made a vertical incision up to just below the urethra. I separated the vaginal mucosa off of the underlying vesicovaginal fascia. I then proceeded with the TVT placement, done according to standard procedure. Please note that I used a bladder stylus to divert the bladder away from each trocar site.  Cystoscopy showed no damage to the bladder. The bladder was drained and then I elevated the sling, using a Kelly clamp between the sling and the urethra to allow no tension on the sling as I elevated the sling. I then closed the defect with interrupted sutures. I excised the excess vaginal mucosa. I closed the edges of the vaginal mucosa in a running, locking fashion, closing the entire vaginal cuff in a vertical fashion. She was extubated and taken to the recovery room in stable condition.

## 2015-07-04 NOTE — Transfer of Care (Signed)
Immediate Anesthesia Transfer of Care Note  Patient: Shannon [name]  Procedure(s) Performed: Procedure(s): TOTAL VAGINAL HYSTERECTOMY   (N/A) BILATERAL SALPINGO OOPHORECTOMY (Bilateral) ANTERIOR REPAIR (CYSTOCELE) (N/A) TRANSVAGINAL TAPE (TVT) SLING                    (N/A)  Patient Location: PACU  Anesthesia Type:Spinal  Level of Consciousness: awake, alert  and oriented  Airway & Oxygen Therapy: Patient Spontanous Breathing and Patient connected to nasal cannula oxygen  Post-op Assessment: Report given to RN and Post -op Vital signs reviewed and stable  Post vital signs: Reviewed and stable  Last Vitals:  Filed Vitals:   07/04/15 0619  BP: 123/69  Pulse: 57  Temp: 36.7 C  Resp: 16    Last Pain: There were no vitals filed for this visit.    Patients Stated Pain Goal: 3 ([id number])  Complications: No apparent anesthesia complications

## 2015-07-04 NOTE — Anesthesia Postprocedure Evaluation (Signed)
Anesthesia Post Note  Patient: Taydra Spruiell Pacini  Procedure(s) Performed: Procedure(s) (LRB): TOTAL VAGINAL HYSTERECTOMY   (N/A) ANTERIOR REPAIR (CYSTOCELE) (N/A) TRANSVAGINAL TAPE (TVT) SLING                    (N/A) CYSTOSCOPY (N/A)  Patient location during evaluation: PACU Anesthesia Type: Spinal and MAC Level of consciousness: awake and alert Pain management: pain level controlled Vital Signs Assessment: post-procedure vital signs reviewed and stable Respiratory status: spontaneous breathing and respiratory function stable Cardiovascular status: blood pressure returned to baseline and stable Postop Assessment: spinal receding Anesthetic complications: no     Last Vitals:  Filed Vitals:   07/04/15 1134 07/04/15 1236  BP: 126/62 110/56  Pulse: 47 55  Temp: 36.4 C 36.8 C  Resp: 16 16    Last Pain: There were no vitals filed for this visit. Pain Goal: Patients Stated Pain Goal: 0 (07/04/15 1236)               Tiajuana Amass

## 2015-07-04 NOTE — Anesthesia Procedure Notes (Signed)
Spinal  Staffing Anesthesiologist: Suzette Battiest Performed by: anesthesiologist  Preanesthetic Checklist Completed: patient identified, site marked, surgical consent, pre-op evaluation, timeout performed, IV checked, risks and benefits discussed and monitors and equipment checked Spinal Block Patient position: sitting Prep: site prepped and draped and DuraPrep Patient monitoring: blood pressure, continuous pulse ox and heart rate Approach: midline Location: L4-5 Injection technique: single-shot Needle Needle type: Pencan  Needle gauge: 24 G Needle length: 9 cm

## 2015-07-04 NOTE — Addendum Note (Signed)
Addendum  created 07/04/15 1402 by Riki Sheer, CRNA   Modules edited: Clinical Notes   Clinical Notes:  File: [id number]

## 2015-07-04 NOTE — H&P (Signed)
Shannon [name] is an 65 yo MW P2 lady here for a 6 month h/o uterine prolapse, much worse over the last 3 months, now she can see her cervix. It is not painful, but is very uncomfortable and annoying. She is sexually active. She has a h/o mixed incontinence for about 20+ years, wears pads daily, more stress and urge incontinence.   No LMP recorded. Patient is postmenopausal. at about age 65    Past Medical History  Diagnosis Date  . GERD (gastroesophageal reflux disease)     with esophagitis  . Plantar fasciitis     left  . Numbness     Right outer thigh, constant, notices more with standing for a long period of time  . Chronic neck and back pain     received physical therapy    Past Surgical History  Procedure Laterality Date  . Tubal ligation  1987  . Dilation and curettage of uterus    . Colonoscopy    . Upper gi endoscopy      Family History  Problem Relation Age of Onset  . Stroke Mother     x 2  . Heart disease Mother     congenital arrhythmia and CHF  . Depression Mother   . Prostate cancer Brother   . Stomach cancer Paternal Uncle   . Colon cancer Neg Hx   . Esophageal cancer Neg Hx   . Rectal cancer Neg Hx     Social History:  reports that she has never smoked. She has never used smokeless tobacco. She reports that she does not drink alcohol or use illicit drugs.  Allergies:  Allergies  Allergen Reactions  . Neomycin-Bacitracin Zn-Polymyx Rash    Ointment    Prescriptions prior to admission  Medication Sig Dispense Refill Last Dose  . famotidine (PEPCID) 20 MG tablet Take 20 mg by mouth daily as needed for heartburn or indigestion.   07/04/2015 at 0400  . Melatonin 5 MG CAPS Take 1 capsule by mouth at bedtime.     07/03/2015 at 2100  . Multiple Vitamin (MULTIVITAMIN) tablet Take 1 tablet by mouth daily.     07/03/2015 at 0600  . ST JOHNS WORT PO Take 2 capsules by mouth 2 (two) times daily.    Past Month at Unknown time    ROS  Retired Public relations account executive  Blood pressure 123/69, pulse 57, temperature 98 F (36.7 C), temperature source Oral, resp. rate 16, SpO2 100 %. Physical Exam  Heart- rrr Lungs- CTAB Abd- benign  No results found for this or any previous visit (from the past 24 hour(s)).  No results found.  Assessment/Plan: Symptomatic uterine and bladder prolapse, mixed incontinence Plan for TVH/BSO (if possible), anterior repair, sling, cystoscopy  She understands the risks of surgery, including, but not to infection, bleeding, DVTs, damage to bowel, bladder, ureters. She wishes to proceed.     Emily Filbert 07/04/2015, 7:16 AM

## 2015-07-05 ENCOUNTER — Encounter (HOSPITAL_COMMUNITY): Payer: Self-pay | Admitting: Obstetrics & Gynecology

## 2015-07-05 DIAGNOSIS — N814 Uterovaginal prolapse, unspecified: Secondary | ICD-10-CM | POA: Diagnosis not present

## 2015-07-05 LAB — CBC
HEMATOCRIT: 33.4 % — AB (ref 36.0–46.0)
Hemoglobin: 11 g/dL — ABNORMAL LOW (ref 12.0–15.0)
MCH: 29.4 pg (ref 26.0–34.0)
MCHC: 32.9 g/dL (ref 30.0–36.0)
MCV: 89.3 fL (ref 78.0–100.0)
Platelets: 192 10*3/uL (ref 150–400)
RBC: 3.74 MIL/uL — AB (ref 3.87–5.11)
RDW: 13.4 % (ref 11.5–15.5)
WBC: 9.4 10*3/uL (ref 4.0–10.5)

## 2015-07-05 MED ORDER — FLAVOXATE HCL 100 MG PO TABS: 100.0000 mg | ORAL_TABLET | Freq: Three times a day (TID) | ORAL | Status: DC | PRN

## 2015-07-05 MED ORDER — IBUPROFEN 400 MG PO TABS: 400.0000 mg | ORAL_TABLET | Freq: Four times a day (QID) | ORAL | Status: DC | PRN

## 2015-07-05 MED ORDER — OXYCODONE-ACETAMINOPHEN 5-325 MG PO TABS: 1.0000 | ORAL_TABLET | ORAL | Status: DC | PRN

## 2015-07-05 NOTE — Discharge Instructions (Signed)
Anterior and Posterior Colporrhaphy, Sling Procedure, Care After Refer to this sheet in the next few weeks. These instructions provide you with information on caring for yourself after your procedure. Your health care provider may also give you more specific instructions. Your treatment has been planned according to current medical practices, but problems sometimes occur. Call your health care provider if you have any problems or questions after your procedure.  HOME CARE INSTRUCTIONS  Rest as much as possible during the first 2 weeks after the procedure.   Avoid heavy lifting (more than 10 pounds [4.5 kg]), pushing, or pulling. Limit stair climbing to once or twice a day the first week, then slowly increase this activity.   Avoid standing for prolonged periods of time.   Talk with your health care provider about when you may resume your usual physical activity.   You may resume your normal diet right away.   Drink at least 6-8 glasses of non-caffeinated beverages per day.   Eat a well-balanced diet. Daily portions of food from the meat (protein), milk, fruit, vegetable, and bread families are necessary for your health.   Your normal bowel function should return. If you become constipated, you may:   Take a mild laxative.  Add fruit and bran to your diet.  Drink more liquids.  You may take a shower and wash your hair.   Only take over-the-counter or prescription medicines as directed by your health care provider.   Clean the incision with water. Do not use a dressing unless the incision is draining or irritated. Check your incision daily for redness, draining, swelling, or separation of the skin.   Follow any bladder care instructions provided by your health care provider.   Keep your perineal area (the area between vagina and rectum) clean and dry. Perform perineal care after every bowel movement and each time you urinate. You may take a sitz bath or sit in a tub of  clean, warm water when necessary, unless your health care provider tells you otherwise.   Do not have sexual intercourse until permitted by your health care provider.   Follow up with your health care provider as directed.  SEEK MEDICAL CARE IF:  You have shaking chills.   Your pain is not relieved with medicine or becomes worse.  You have frequent or urgent urination, or you are unable to completely empty your bladder.   You feel a burning sensation when urinating.   You see pus coming from the wounds.  SEEK IMMEDIATE MEDICAL CARE IF:  You develop a fever.  You notice redness, drainage, swelling, or separation of the skin at the incision site.  You have difficulty breathing.  You are unable to urinate. MAKE SURE YOU:   Understand these instructions.  Will watch your condition.  Will get help right away if you are not doing well or get worse.   This information is not intended to replace advice given to you by your health care provider. Make sure you discuss any questions you have with your health care provider.   Document Released: 08/22/2003 Document Revised: 09/09/2012 Document Reviewed: 05/29/2012 Elsevier Interactive Patient Education 2016 [place]. Abdominal Hysterectomy, Care After Refer to this sheet in the next few weeks. These instructions provide you with information on caring for yourself after your procedure. Your health care provider may also give you more specific instructions. Your treatment has been planned according to current medical practices, but problems sometimes occur. Call your health care provider if you  have any problems or questions after your procedure.  WHAT TO EXPECT AFTER THE PROCEDURE After your procedure, it is typical to have the following:  Pain.  Feeling tired.  Poor appetite.  Less interest in sex. It takes 4-6 weeks to recover from this surgery.  HOME CARE INSTRUCTIONS   Take pain medicines only as directed by  your health care provider. Do not take over-the-counter pain medicines without checking with your health care provider first.  Change your bandage as directed by your health care provider.  Return to your health care provider to have your sutures taken out.  Take showers instead of baths for 2-3 weeks. Ask your health care provider when it is safe to start showering.  Do not douche, use tampons, or have sexual intercourse for at least 6 weeks or until your health care provider says you can.   Follow your health care provider's advice about exercise, lifting, driving, and general activities.  Get plenty of rest and sleep.   Do not lift anything heavier than a gallon of milk (about 10 lb [4.5 kg]) for the first month after surgery.  You can resume your normal diet if your health care provider says it is okay.   Do not drink alcohol until your health care provider says you can.   If you are constipated, ask your health care provider if you can take a mild laxative.  Eating foods high in fiber may also help with constipation. Eat plenty of raw fruits and vegetables, whole grains, and beans.  Drink enough fluids to keep your urine clear or pale yellow.   Try to have someone at home with you for the first 1-2 weeks to help around the house.  Keep all follow-up appointments. SEEK MEDICAL CARE IF:   You have chills or fever.  You have swelling, redness, or pain in the area of your incision that is getting worse.   You have pus coming from the incision.   You notice a bad smell coming from the incision or bandage.   Your incision breaks open.   You feel dizzy or light-headed.   You have pain or bleeding when you urinate.   You have persistent diarrhea.   You have persistent nausea and vomiting.   You have abnormal vaginal discharge.   You have a rash.   You have any type of abnormal reaction or develop an allergy to your medicine.   Your pain medicine  is not helping.  SEEK IMMEDIATE MEDICAL CARE IF:   You have a fever and your symptoms suddenly get worse.  You have severe abdominal pain.  You have chest pain.  You have shortness of breath.  You faint.  You have pain, swelling, or redness of your leg.  You have heavy vaginal bleeding with blood clots. MAKE SURE YOU:  Understand these instructions.  Will watch your condition.  Will get help right away if you are not doing well or get worse.   This information is not intended to replace advice given to you by your health care provider. Make sure you discuss any questions you have with your health care provider.   Document Released: 07/27/2004 Document Revised: 01/28/2014 Document Reviewed: 10/30/2012 Elsevier Interactive Patient Education 2016 [place] Saturday MORNING. IF YOU CANNOT VOID IN 6 HOURS AFTER STANDING IN THE SHOWER, PLEASE COME TO [facility].

## 2015-07-05 NOTE — Discharge Summary (Signed)
Physician Discharge Summary  Patient ID: Shannon [name] MRN: [id number] DOB/AGE: [birth year] 67 y.o.  Admit date: 07/04/2015 Discharge date: 07/05/2015  Admission Diagnoses: Symptomatic cystocele, uterine prolapse, stress incontinence  Discharge Diagnoses: same Active Problems:   Post-operative state   Discharged Condition: good  Hospital Course: She underwent an uncomplicated TVH/anterior repair/midurethral sling/cystoscopy under spinal anesthesia. By POD #1 she was ambulating, tolerating po, passing flatus, and voicing her readiness to go home.  Consults: None  Significant Diagnostic Studies: labs: hbg 11  Treatments: surgery: as above  Discharge Exam: Blood pressure 88/48, pulse 62, temperature 98.7 F (37.1 C), temperature source Oral, resp. rate 15, height 5\' 10"  (1.778 m), weight 71.215 kg (157 lb), SpO2 99 %. General appearance: alert Resp: clear to auscultation bilaterally Cardio: regular rate and rhythm, S1, S2 normal, no murmur, click, rub or gallop GI: soft, non-tender; bowel sounds normal; no masses,  no organomegaly Foley: draining clear urine Disposition: Final discharge disposition not confirmed     Medication List    TAKE these medications        famotidine 20 MG tablet  Commonly known as:  PEPCID  Take 20 mg by mouth daily as needed for heartburn or indigestion.     flavoxATE 100 MG tablet  Commonly known as:  URISPAS  Take 1 tablet (100 mg total) by mouth 3 (three) times daily as needed for bladder spasms.     ibuprofen 400 MG tablet  Commonly known as:  ADVIL,MOTRIN  Take 1 tablet (400 mg total) by mouth every 6 (six) hours as needed.     Melatonin 5 MG Caps  Take 1 capsule by mouth at bedtime.     multivitamin tablet  Take 1 tablet by mouth daily.     oxyCODONE-acetaminophen 5-325 MG tablet  Commonly known as:  PERCOCET/ROXICET  Take 1-2 tablets by mouth every 4 (four) hours as needed for severe pain (moderate to severe pain (when  tolerating fluids)).     ST JOHNS WORT PO  Take 2 capsules by mouth 2 (two) times daily.          Miralax prn     Follow-up Information    Follow up with Emily Filbert, MD. Schedule an appointment as soon as possible for a visit in 3 weeks.   Specialty:  Obstetrics and Gynecology   Contact information:   [place] [address] [phone]       Signed: Emily Filbert 07/05/2015, 8:50 AM

## 2015-07-10 ENCOUNTER — Ambulatory Visit (INDEPENDENT_AMBULATORY_CARE_PROVIDER_SITE_OTHER): Payer: Medicare Other | Admitting: Obstetrics & Gynecology

## 2015-07-10 ENCOUNTER — Encounter: Payer: Self-pay | Admitting: Obstetrics & Gynecology

## 2015-07-10 VITALS — BP 120/66 | HR 70 | Resp 16 | Wt 159.0 lb

## 2015-07-10 DIAGNOSIS — R3915 Urgency of urination: Secondary | ICD-10-CM

## 2015-07-10 LAB — POCT URINALYSIS DIPSTICK
Bilirubin, UA: NEGATIVE
GLUCOSE UA: NEGATIVE
Ketones, UA: NEGATIVE
NITRITE UA: NEGATIVE
Spec Grav, UA: 1.015
UROBILINOGEN UA: 0.2
pH, UA: 6

## 2015-07-10 MED ORDER — CIPROFLOXACIN HCL 500 MG PO TABS: 500.0000 mg | ORAL_TABLET | Freq: Two times a day (BID) | ORAL | Status: DC

## 2015-07-10 MED ORDER — PHENAZOPYRIDINE HCL 95 MG PO TABS: 95.0000 mg | ORAL_TABLET | Freq: Three times a day (TID) | ORAL | Status: DC | PRN

## 2015-07-10 NOTE — Addendum Note (Signed)
Addended by: Gretchen Short on: 07/10/2015 01:58 PM   Modules accepted: Orders

## 2015-07-10 NOTE — Progress Notes (Signed)
   Subjective:    Patient ID: Shannon [name], female    DOB: [birth year], 65 y.o.   MRN: [id number]  HPI  65 yo lady here a week after a TVH/Anterior repair/midurethral sling. She is having no problems except for urinary frequency and urgency. She denies dysuria and fevers. She is having normal BMs.  Review of Systems     Objective:   Physical Exam WNWHWFNAD Breathing, conversing, and ambulating normally Abd- benign Back- no CVAT UA- indicative of UTI       Assessment & Plan:  UTI- treat with cipro Send uc&s Treat with pyridium as the urispas makes her sleepy

## 2015-07-10 NOTE — Progress Notes (Signed)
Shannon [name] left message with after hours nurse line stating she had hysterectomy and cystocele repair last Tuesday. She took cath out on Saturday and now has urinary frequency and urgency. She denies any fever and pain with urination.

## 2015-07-10 NOTE — Addendum Note (Signed)
Addended by: Emily Filbert on: 07/10/2015 01:58 PM   Modules accepted: Level of Service

## 2015-07-12 LAB — CULTURE, URINE COMPREHENSIVE
COLONY COUNT: NO GROWTH
Organism ID, Bacteria: NO GROWTH

## 2015-08-02 ENCOUNTER — Ambulatory Visit (INDEPENDENT_AMBULATORY_CARE_PROVIDER_SITE_OTHER): Payer: Medicare Other | Admitting: Obstetrics & Gynecology

## 2015-08-02 ENCOUNTER — Encounter: Payer: Self-pay | Admitting: Obstetrics & Gynecology

## 2015-08-02 VITALS — BP 98/63 | HR 66 | Resp 18 | Wt 157.0 lb

## 2015-08-02 DIAGNOSIS — Z9889 Other specified postprocedural states: Secondary | ICD-10-CM

## 2015-08-02 NOTE — Progress Notes (Signed)
Pt here today for post-op check, c/o chronic constipation and has to take Colace 400mg  daily to maintain regularity.

## 2015-08-02 NOTE — Progress Notes (Signed)
   Subjective:    Patient ID: Shannon [name], female    DOB: Sep 04, 1950, 65 y.o.   MRN: [id number]  HPI 65 yo MW lady here for a 4 week post op visit after a TVH/sling/anterior repair. She is doing well. She never even filled her percocet prescription. She only used IBU. She reports that her GSUI has resolved and that her mild urge incontinence has not worsened and is the same as it was pre operatively. Her only issue is that she has had to use colace since surgery. She has a GI but does not want a referral as of today.   Review of Systems     Objective:   Physical Exam WNWHWFNAD Breathing, conversing, and ambulating normally Abd- benign Vaginal cuff healed great Bimanual exam negative       Assessment & Plan:  Post op- doing well RTC 1 year/prn sooner

## 2015-09-11 ENCOUNTER — Ambulatory Visit (INDEPENDENT_AMBULATORY_CARE_PROVIDER_SITE_OTHER): Payer: Medicare Other | Admitting: Family Medicine

## 2015-09-11 ENCOUNTER — Encounter: Payer: Self-pay | Admitting: Family Medicine

## 2015-09-11 VITALS — BP 94/64 | HR 72 | Temp 98.7°F | Ht 70.0 in | Wt 158.0 lb

## 2015-09-11 DIAGNOSIS — W102XXA Fall (on)(from) incline, initial encounter: Secondary | ICD-10-CM

## 2015-09-11 DIAGNOSIS — S8011XA Contusion of right lower leg, initial encounter: Secondary | ICD-10-CM | POA: Diagnosis not present

## 2015-09-11 NOTE — Progress Notes (Signed)
Dr. Frederico Hamman T. Samyukta Cura, MD, [facility] [place], [address] Phone: [phone] Fax: [phone]  09/11/2015  Patient: Shannon [name], MRN: [id number], DOB: 06-23-1950, 65 y.o.  Primary Physician:  Loura Pardon, MD   Chief Complaint  Patient presents with  . Fall    Hiking x 1 week ago  . Ankle Pain    Right  . Leg Pain    Right  . Abdominal Bruising   Subjective:   Shannon [name] is a 65 y.o. very pleasant female patient who presents with the following:  Trauma: DOI 09/04/2015  [place] on hike in [place] and fell about 30 feet.  She tumbled down the mountain and had some extensive bruising and a large hematoma on her right lower extremity. She was able to hike 2 miles out without any difficulty. She has some bruising but no significant amount of pain anywhere. She does have some mild lower abdominal bruising and pain. She is eating and drinking and passing gas and having normal bowel movements. Urinating normally.  Past Medical History, Surgical History, Social History, Family History, Problem List, Medications, and Allergies have been reviewed and updated if relevant.  Patient Active Problem List   Diagnosis Date Noted  . Post-operative state 07/04/2015  . Third degree uterine prolapse 04/12/2015  . Incontinence of urine in female 04/12/2015  . Pelvic relaxation due to uterine prolapse 03/27/2015  . UTI (urinary tract infection) 12/21/2014  . Right shoulder pain 12/02/2014  . Neck pain on right side 12/02/2014  . Thoracic back pain 12/02/2014  . Medicare annual wellness visit, initial 11/30/2014  . Routine general medical examination at a health care facility 11/05/2012  . Other screening mammogram 11/13/2011  . PLANTAR FASCIITIS, LEFT 07/28/2009  . NEOPLASM, SKIN, UNCERTAIN BEHAVIOR [id number]  . PERSONAL HX COLONIC POLYPS 11/02/2007  . ESOPHAGITIS, MILD 03/31/2007  . GERD 10/20/2006    Past  Medical History:  Diagnosis Date  . Chronic neck and back pain    received physical therapy  . GERD (gastroesophageal reflux disease)    with esophagitis  . Numbness    Right outer thigh, constant, notices more with standing for a long period of time  . Plantar fasciitis    left    Past Surgical History:  Procedure Laterality Date  . BLADDER SUSPENSION N/A 07/04/2015   Procedure: TRANSVAGINAL TAPE (TVT) SLING                   ;  Surgeon: Emily Filbert, MD;  Location: [facility];  Service: Gynecology;  Laterality: N/A;  . COLONOSCOPY    . CYSTOCELE REPAIR N/A 07/04/2015   Procedure: ANTERIOR REPAIR (CYSTOCELE);  Surgeon: Emily Filbert, MD;  Location: [facility];  Service: Gynecology;  Laterality: N/A;  . CYSTOSCOPY N/A 07/04/2015   Procedure: CYSTOSCOPY;  Surgeon: Emily Filbert, MD;  Location: [facility];  Service: Gynecology;  Laterality: N/A;  . DILATION AND CURETTAGE OF UTERUS    . TUBAL LIGATION  1987  . UPPER GI ENDOSCOPY    . VAGINAL HYSTERECTOMY N/A 07/04/2015   Procedure: TOTAL VAGINAL HYSTERECTOMY  ;  Surgeon: Emily Filbert, MD;  Location: [facility];  Service: Gynecology;  Laterality: N/A;    Social History   Social History  . Marital status: Married    Spouse name: N/A  . Number of children: N/A  . Years of education: N/A   Occupational History  .  Not on file.   Social History Main Topics  . Smoking status: Never Smoker  . Smokeless tobacco: Never Used  . Alcohol use No     Comment: rare  . Drug use: No  . Sexual activity: Not Currently    Birth control/ protection: Post-menopausal, Surgical   Other Topics Concern  . Not on file   Social History Narrative  . No narrative on file    Family History  Problem Relation Age of Onset  . Stroke Mother     x 2  . Heart disease Mother     congenital arrhythmia and CHF  . Depression Mother   . Prostate cancer Brother   . Stomach cancer Paternal Uncle   . Colon cancer Neg Hx   . Esophageal cancer Neg Hx   . Rectal cancer Neg Hx       Allergies  Allergen Reactions  . Neomycin-Bacitracin Zn-Polymyx Rash    Ointment    Medication list reviewed and updated in full in [place].  GEN: No fevers, chills. Nontoxic. Primarily MSK c/o today. MSK: Detailed in the HPI GI: tolerating PO intake without difficulty Neuro: No numbness, parasthesias, or tingling associated. Otherwise the pertinent positives of the ROS are noted above.   Objective:   BP 94/64   Pulse 72   Temp 98.7 F (37.1 C) (Oral)   Ht 5\' 10"  (1.778 m)   Wt 158 lb (71.7 kg)   BMI 22.67 kg/m    GEN: WDWN, NAD, Non-toxic, Alert & Oriented x 3 HEENT: Atraumatic, Normocephalic.  Ears and Nose: No external deformity. EXTR: No clubbing/cyanosis/edema NEURO: Normal gait.  ABD: S, NT, ND, + BS, No rebound, No HSM  PSYCH: Normally interactive. Conversant. Not depressed or anxious appearing.  Calm demeanor.    All bony anatomy is nontender. Extensive bruising on the right lower extremity, more laterally and down to the ankle. Ankle is entirely stable.  Radiology: No results found.  Assessment and Plan:   Leg hematoma, right, initial encounter  Fall (on)(from) incline, initial encounter  Miraculously well after an injury and fall from a mountain. Hematoma resolving. Bruising will continue to resolve over the next few weeks. Reassurance given.  Follow-up: No Follow-up on file.  Signed,  Maud Deed. Jaloni Davoli, MD   Patient's Medications  New Prescriptions   No medications on file  Previous Medications   FAMOTIDINE (PEPCID) 20 MG TABLET    Take 20 mg by mouth daily as needed for heartburn or indigestion.   MAGNESIUM PO    Take 1 tablet by mouth at bedtime.   MELATONIN 5 MG CAPS    Take 1 capsule by mouth at bedtime.     MULTIPLE VITAMIN (MULTIVITAMIN) TABLET    Take 1 tablet by mouth daily.    Modified Medications   No medications on file  Discontinued Medications   DOCUSATE SODIUM (COLACE) 100 MG CAPSULE    Take 100 mg by mouth 4  (four) times daily.   IBUPROFEN (ADVIL,MOTRIN) 400 MG TABLET    Take 1 tablet (400 mg total) by mouth every 6 (six) hours as needed.   ST JOHNS WORT PO    Take 2 capsules by mouth 2 (two) times daily.

## 2015-09-11 NOTE — Progress Notes (Signed)
Pre visit review using our clinic review tool, if applicable. No additional management support is needed unless otherwise documented below in the visit note. 

## 2015-10-17 ENCOUNTER — Ambulatory Visit (INDEPENDENT_AMBULATORY_CARE_PROVIDER_SITE_OTHER): Payer: Medicare Other | Admitting: Obstetrics & Gynecology

## 2015-10-17 ENCOUNTER — Encounter: Payer: Self-pay | Admitting: Obstetrics & Gynecology

## 2015-10-17 VITALS — BP 110/69 | HR 65 | Wt 160.0 lb

## 2015-10-17 DIAGNOSIS — N905 Atrophy of vulva: Secondary | ICD-10-CM

## 2015-10-17 MED ORDER — TOLTERODINE TARTRATE 1 MG PO TABS: 1.0000 mg | ORAL_TABLET | Freq: Two times a day (BID) | ORAL | 12 refills | Status: DC

## 2015-10-17 NOTE — Progress Notes (Signed)
   Subjective:    Patient ID: Shannon [name], female    DOB: 1950-10-30, 65 y.o.   MRN: [id number]  HPI  65 yo MW lady here to discuss her desire for ERT for vaginal atrophy and hopefully decreasing her incidence of UTIs. She has no GSUI since her sling/TVH but she does have nocturia (about 3 times per night) and some UI during the day.   Review of Systems     Objective:   Physical Exam WNWHWFNAD Breathing, conversing, and ambulating normally  Abd- benign       Assessment & Plan:  Vulvovaginal atrophy- compounded vaginal estrogen (due to cost) 3 times per week detrol 1 mg BID RTC 1-2 months/prn sooner

## 2015-11-06 ENCOUNTER — Other Ambulatory Visit: Payer: Self-pay | Admitting: Family Medicine

## 2015-11-06 DIAGNOSIS — Z1231 Encounter for screening mammogram for malignant neoplasm of breast: Secondary | ICD-10-CM

## 2015-11-19 ENCOUNTER — Telehealth: Payer: Self-pay | Admitting: Family Medicine

## 2015-11-19 DIAGNOSIS — Z0000 Encounter for general adult medical examination without abnormal findings: Secondary | ICD-10-CM

## 2015-11-19 NOTE — Telephone Encounter (Signed)
-----   Message from Marchia Bond sent at 11/17/2015 11:34 AM EDT ----- Regarding: Cpx labs Fri 11/3, need orders. Thanks! :-) Please order  future cpx labs for pt's upcoming lab appt. Thanks Aniceto Boss

## 2015-11-24 ENCOUNTER — Ambulatory Visit (INDEPENDENT_AMBULATORY_CARE_PROVIDER_SITE_OTHER): Payer: Medicare Other

## 2015-11-24 ENCOUNTER — Other Ambulatory Visit: Payer: Medicare Other

## 2015-11-24 ENCOUNTER — Other Ambulatory Visit: Payer: Self-pay | Admitting: Family Medicine

## 2015-11-24 VITALS — BP 102/64 | HR 59 | Temp 98.4°F | Ht 68.5 in | Wt 157.5 lb

## 2015-11-24 DIAGNOSIS — Z0000 Encounter for general adult medical examination without abnormal findings: Secondary | ICD-10-CM | POA: Diagnosis not present

## 2015-11-24 DIAGNOSIS — Z1159 Encounter for screening for other viral diseases: Secondary | ICD-10-CM

## 2015-11-24 LAB — CBC WITH DIFFERENTIAL/PLATELET
BASOS ABS: 0 10*3/uL (ref 0.0–0.1)
Basophils Relative: 0.6 % (ref 0.0–3.0)
Eosinophils Absolute: 0.1 10*3/uL (ref 0.0–0.7)
Eosinophils Relative: 1.7 % (ref 0.0–5.0)
HEMATOCRIT: 40.8 % (ref 36.0–46.0)
Hemoglobin: 13.8 g/dL (ref 12.0–15.0)
LYMPHS PCT: 29.5 % (ref 12.0–46.0)
Lymphs Abs: 1.4 10*3/uL (ref 0.7–4.0)
MCHC: 33.8 g/dL (ref 30.0–36.0)
MCV: 88.3 fl (ref 78.0–100.0)
MONOS PCT: 9.4 % (ref 3.0–12.0)
Monocytes Absolute: 0.4 10*3/uL (ref 0.1–1.0)
Neutro Abs: 2.7 10*3/uL (ref 1.4–7.7)
Neutrophils Relative %: 58.8 % (ref 43.0–77.0)
Platelets: 249 10*3/uL (ref 150.0–400.0)
RBC: 4.61 Mil/uL (ref 3.87–5.11)
RDW: 13.3 % (ref 11.5–15.5)
WBC: 4.6 10*3/uL (ref 4.0–10.5)

## 2015-11-24 LAB — COMPREHENSIVE METABOLIC PANEL
ALK PHOS: 75 U/L (ref 39–117)
ALT: 16 U/L (ref 0–35)
AST: 19 U/L (ref 0–37)
Albumin: 4 g/dL (ref 3.5–5.2)
BILIRUBIN TOTAL: 1 mg/dL (ref 0.2–1.2)
BUN: 26 mg/dL — ABNORMAL HIGH (ref 6–23)
CALCIUM: 9.4 mg/dL (ref 8.4–10.5)
CO2: 30 mEq/L (ref 19–32)
Chloride: 105 mEq/L (ref 96–112)
Creatinine, Ser: 0.75 mg/dL (ref 0.40–1.20)
GFR: 81.84 mL/min (ref >60.00)
Glucose, Bld: 105 mg/dL — ABNORMAL HIGH (ref 70–99)
Potassium: 4.6 mEq/L (ref 3.5–5.1)
Sodium: 141 mEq/L (ref 135–145)
TOTAL PROTEIN: 6.8 g/dL (ref 6.0–8.3)

## 2015-11-24 LAB — LIPID PANEL
Cholesterol: 168 mg/dL (ref 0–200)
HDL: 85.4 mg/dL (ref >39.00)
LDL Cholesterol: 74 mg/dL (ref 0–99)
NONHDL: 82.62
TRIGLYCERIDES: 45 mg/dL (ref 0.0–149.0)
Total CHOL/HDL Ratio: 2
VLDL: 9 mg/dL (ref 0.0–40.0)

## 2015-11-24 LAB — TSH: TSH: 0.62 u[IU]/mL (ref 0.35–4.50)

## 2015-11-24 NOTE — Progress Notes (Signed)
PCP notes:   Health maintenance:  Hep C screening - completed Bone density - pt will discuss with PCP at next appt  Abnormal screenings:   Fall risk - hx of accidental fall with injury  Patient concerns:   None  Nurse concerns:  None  Next PCP appt:   12/06/2015 @ 1115  I reviewed health advisor's note, was available for consultation, and agree with documentation and plan. Loura Pardon MD

## 2015-11-24 NOTE — Progress Notes (Signed)
Pre visit review using our clinic review tool, if applicable. No additional management support is needed unless otherwise documented below in the visit note. 

## 2015-11-24 NOTE — Patient Instructions (Signed)
Ms. Bayona , Thank you for taking time to come for your Medicare Wellness Visit. I appreciate your ongoing commitment to your health goals. Please review the following plan we discussed and let me know if I can assist you in the future.   These are the goals we discussed: Goals    . physical          Starting 11/24/2015, I will continue to walk at least 60 min 5-7 days per week.        This is a list of the screening recommended for you and due dates:  Health Maintenance  Topic Date Due  . DEXA scan (bone density measurement)  11/23/2016*  . Mammogram  12/12/2015  . Colon Cancer Screening  05/28/2016  . Tetanus Vaccine  02/10/2020  . Flu Shot  Addressed  . Shingles Vaccine  Completed  .  Hepatitis C: One time screening is recommended by Center for Disease Control  (CDC) for  adults born from [birth year] through 1967.   Completed  . Pneumonia vaccines  Completed  *Topic was postponed. The date shown is not the original due date.   Preventive Care for Adults  A healthy lifestyle and preventive care can promote health and wellness. Preventive health guidelines for adults include the following key practices.  . A routine yearly physical is a good way to check with your health care provider about your health and preventive screening. It is a chance to share any concerns and updates on your health and to receive a thorough exam.  . Visit your dentist for a routine exam and preventive care every 6 months. Brush your teeth twice a day and floss once a day. Good oral hygiene prevents tooth decay and gum disease.  . The frequency of eye exams is based on your age, health, family medical history, use  of contact lenses, and other factors. Follow your health care provider's ecommendations for frequency of eye exams.  . Eat a healthy diet. Foods like vegetables, fruits, whole grains, low-fat dairy products, and lean protein foods contain the nutrients you need without too many calories. Decrease your  intake of foods high in solid fats, added sugars, and salt. Eat the right amount of calories for you. Get information about a proper diet from your health care provider, if necessary.  . Regular physical exercise is one of the most important things you can do for your health. Most adults should get at least 150 minutes of moderate-intensity exercise (any activity that increases your heart rate and causes you to sweat) each week. In addition, most adults need muscle-strengthening exercises on 2 or more days a week.  Silver Sneakers may be a benefit available to you. To determine eligibility, you may visit the website: www.silversneakers.com or contact program at (413)471-4009 Mon-Fri between 8AM-8PM.   . Maintain a healthy weight. The body mass index (BMI) is a screening tool to identify possible weight problems. It provides an estimate of body fat based on height and weight. Your health care provider can find your BMI and can help you achieve or maintain a healthy weight.   For adults 20 years and older: ? A BMI below 18.5 is considered underweight. ? A BMI of 18.5 to 24.9 is normal. ? A BMI of 25 to 29.9 is considered overweight. ? A BMI of 30 and above is considered obese.   . Maintain normal blood lipids and cholesterol levels by exercising and minimizing your intake of saturated fat. Eat a balanced diet  with plenty of fruit and vegetables. Blood tests for lipids and cholesterol should begin at age 83 and be repeated every 5 years. If your lipid or cholesterol levels are high, you are over 50, or you are at high risk for heart disease, you may need your cholesterol levels checked more frequently. Ongoing high lipid and cholesterol levels should be treated with medicines if diet and exercise are not working.  . If you smoke, find out from your health care provider how to quit. If you do not use tobacco, please do not start.  . If you choose to drink alcohol, please do not consume more than 2  drinks per day. One drink is considered to be 12 ounces (355 mL) of beer, 5 ounces (148 mL) of wine, or 1.5 ounces (44 mL) of liquor.  . If you are 63-25 years old, ask your health care provider if you should take aspirin to prevent strokes.  . Use sunscreen. Apply sunscreen liberally and repeatedly throughout the day. You should seek shade when your shadow is shorter than you. Protect yourself by wearing long sleeves, pants, a wide-brimmed hat, and sunglasses year round, whenever you are outdoors.  . Once a month, do a whole body skin exam, using a mirror to look at the skin on your back. Tell your health care provider of new moles, moles that have irregular borders, moles that are larger than a pencil eraser, or moles that have changed in shape or color.

## 2015-11-24 NOTE — Progress Notes (Signed)
Subjective:   Shannon [name] is a 65 y.o. female who presents for Medicare Annual (Subsequent) preventive examination.  Review of Systems:  N/A Cardiac Risk Factors include: advanced age (>25men, >31 women)     Objective:     Vitals: BP 102/64 (BP Location: Right Arm, Patient Position: Sitting, Cuff Size: Normal)   Pulse (!) 59   Temp 98.4 F (36.9 C) (Oral)   Ht 5' 8.5" (1.74 m) Comment: no shoes  Wt 157 lb 8 oz (71.4 kg)   SpO2 98%   BMI 23.60 kg/m   Body mass index is 23.6 kg/m.   Tobacco History  Smoking Status  . Never Smoker  Smokeless Tobacco  . Never Used     Counseling given: No   Past Medical History:  Diagnosis Date  . Chronic neck and back pain    received physical therapy  . GERD (gastroesophageal reflux disease)    with esophagitis  . Numbness    Right outer thigh, constant, notices more with standing for a long period of time  . Plantar fasciitis    left   Past Surgical History:  Procedure Laterality Date  . BLADDER SUSPENSION N/A 07/04/2015   Procedure: TRANSVAGINAL TAPE (TVT) SLING                   ;  Surgeon: Emily Filbert, MD;  Location: [facility];  Service: Gynecology;  Laterality: N/A;  . COLONOSCOPY    . CYSTOCELE REPAIR N/A 07/04/2015   Procedure: ANTERIOR REPAIR (CYSTOCELE);  Surgeon: Emily Filbert, MD;  Location: [facility];  Service: Gynecology;  Laterality: N/A;  . CYSTOSCOPY N/A 07/04/2015   Procedure: CYSTOSCOPY;  Surgeon: Emily Filbert, MD;  Location: [facility];  Service: Gynecology;  Laterality: N/A;  . DILATION AND CURETTAGE OF UTERUS    . TUBAL LIGATION  1987  . UPPER GI ENDOSCOPY    . VAGINAL HYSTERECTOMY N/A 07/04/2015   Procedure: TOTAL VAGINAL HYSTERECTOMY  ;  Surgeon: Emily Filbert, MD;  Location: [facility];  Service: Gynecology;  Laterality: N/A;   Family History  Problem Relation Age of Onset  . Stroke Mother     x 2  . Heart disease Mother     congenital arrhythmia and CHF  . Depression Mother   . Prostate cancer Brother   .  Stomach cancer Paternal Uncle   . Colon cancer Neg Hx   . Esophageal cancer Neg Hx   . Rectal cancer Neg Hx    History  Sexual Activity  . Sexual activity: Yes  . Birth control/ protection: Post-menopausal, Surgical    Outpatient Encounter Prescriptions as of 11/24/2015  Medication Sig  . D-MANNOSE PO Take by mouth 2 (two) times daily.  Marland Kitchen ESTROGENS, CONJUGATED VA Place vaginally 3 (three) times a week.  . famotidine (PEPCID) 20 MG tablet Take 20 mg by mouth daily as needed for heartburn or indigestion.  Marland Kitchen MAGNESIUM PO Take 1 tablet by mouth at bedtime.  . Melatonin 5 MG CAPS Take 1 capsule by mouth at bedtime.    . Multiple Vitamin (MULTIVITAMIN) tablet Take 1 tablet by mouth daily.    . ST JOHNS WORT PO Take by mouth daily.  . [DISCONTINUED] tolterodine (DETROL) 1 MG tablet Take 1 tablet (1 mg total) by mouth 2 (two) times daily.   No facility-administered encounter medications on file as of 11/24/2015.     Activities of Daily Living In your present state of health, do you have  any difficulty performing the following activities: 11/24/2015 07/04/2015  Hearing? N -  Vision? N -  Difficulty concentrating or making decisions? N -  Walking or climbing stairs? N -  Dressing or bathing? N -  Doing errands, shopping? N N  Preparing Food and eating ? N -  Using the Toilet? N -  In the past six months, have you accidently leaked urine? Y -  Do you have problems with loss of bowel control? N -  Managing your Medications? N -  Managing your Finances? N -  Housekeeping or managing your Housekeeping? N -  Some recent data might be hidden    Patient Care Team: Abner Greenspan, MD as PCP - General Emily Filbert, MD as Consulting Physician (Obstetrics and Gynecology)    Assessment:     Hearing Screening   125Hz  250Hz  500Hz  1000Hz  2000Hz  3000Hz  4000Hz  6000Hz  8000Hz   Right ear:   40 40 40  40    Left ear:   40 40 40  40      Visual Acuity Screening   Right eye Left eye Both eyes    Without correction:     With correction: 20/20 20/15 20/13     Exercise Activities and Dietary recommendations Current Exercise Habits: Home exercise routine, Type of exercise: walking, Time (Minutes): 60, Frequency (Times/Week): 7, Weekly Exercise (Minutes/Week): 420, Intensity: Moderate, Exercise limited by: None identified  Goals    . physical          Starting 11/24/2015, I will continue to walk at least 60 min 5-7 days per week.       Fall Risk Fall Risk  11/24/2015 11/30/2014 11/17/2013  Falls in the past year? Yes No No  Number falls in past yr: 1 - -  Injury with Fall? Yes - -  Follow up Falls evaluation completed - -   Depression Screen PHQ 2/9 Scores 11/24/2015 11/30/2014 11/17/2013 11/13/2011  PHQ - 2 Score 0 0 0 0     Cognitive Function MMSE - Mini Mental State Exam 11/24/2015  Orientation to time 5  Orientation to Place 5  Registration 3  Attention/ Calculation 0  Recall 3  Language- name 2 objects 0  Language- repeat 1  Language- follow 3 step command 3  Language- read & follow direction 0  Write a sentence 0  Copy design 0  Total score 20     PLEASE NOTE: A Mini-Cog screen was completed. Maximum score is 20. A value of 0 denotes this part of Folstein MMSE was not completed or the patient failed this part of the Mini-Cog screening.   Mini-Cog Screening Orientation to Time - Max 5 pts Orientation to Place - Max 5 pts Registration - Max 3 pts Recall - Max 3 pts Language Repeat - Max 1 pts Language Follow 3 Step Command - Max 3 pts     Immunization History  Administered Date(s) Administered  . Influenza Whole 01/21/2005, 11/10/2006  . Influenza-Unspecified 10/31/2012, 11/21/2014, 10/05/2015  . Pneumococcal Conjugate-13 11/30/2014  . Pneumococcal Polysaccharide-23 11/17/2013  . Td 10/15/2001, 02/09/2010  . Zoster 09/04/2010   Screening Tests Health Maintenance  Topic Date Due  . DEXA SCAN  11/23/2016 (Originally 07/21/2013)  . MAMMOGRAM  12/12/2015   . COLONOSCOPY  05/28/2016  . TETANUS/TDAP  02/10/2020  . INFLUENZA VACCINE  Addressed  . ZOSTAVAX  Completed  . Hepatitis C Screening  Completed  . PNA vac Low Risk Adult  Completed      Plan:  I have personally reviewed and addressed the Medicare Annual Wellness questionnaire and have noted the following in the patient's chart:  A. Medical and social history B. Use of alcohol, tobacco or illicit drugs  C. Current medications and supplements D. Functional ability and status E.  Nutritional status F.  Physical activity G. Advance directives H. List of other physicians I.  Hospitalizations, surgeries, and ER visits in previous 12 months J.  [place] to include hearing, vision, cognitive, depression L. Referrals and appointments - none  In addition, I have reviewed and discussed with patient certain preventive protocols, quality metrics, and best practice recommendations. A written personalized care plan for preventive services as well as general preventive health recommendations were provided to patient.  See attached scanned questionnaire for additional information.   Signed,   Lindell Noe, MHA, BS, LPN Health Coach

## 2015-11-25 LAB — HEPATITIS C ANTIBODY: HCV AB: NEGATIVE

## 2015-12-01 ENCOUNTER — Encounter: Payer: Medicare Other | Admitting: Family Medicine

## 2015-12-06 ENCOUNTER — Ambulatory Visit (INDEPENDENT_AMBULATORY_CARE_PROVIDER_SITE_OTHER): Payer: Medicare Other | Admitting: Family Medicine

## 2015-12-06 ENCOUNTER — Encounter: Payer: Self-pay | Admitting: Family Medicine

## 2015-12-06 VITALS — BP 98/62 | HR 58 | Temp 98.5°F | Ht 68.5 in | Wt 159.5 lb

## 2015-12-06 DIAGNOSIS — N3090 Cystitis, unspecified without hematuria: Secondary | ICD-10-CM | POA: Diagnosis not present

## 2015-12-06 DIAGNOSIS — R739 Hyperglycemia, unspecified: Secondary | ICD-10-CM | POA: Insufficient documentation

## 2015-12-06 DIAGNOSIS — Z0000 Encounter for general adult medical examination without abnormal findings: Secondary | ICD-10-CM

## 2015-12-06 DIAGNOSIS — R300 Dysuria: Secondary | ICD-10-CM | POA: Diagnosis not present

## 2015-12-06 DIAGNOSIS — E2839 Other primary ovarian failure: Secondary | ICD-10-CM | POA: Diagnosis not present

## 2015-12-06 DIAGNOSIS — R7303 Prediabetes: Secondary | ICD-10-CM | POA: Insufficient documentation

## 2015-12-06 LAB — POC URINALSYSI DIPSTICK (AUTOMATED)
BILIRUBIN UA: NEGATIVE
Blood, UA: 80
Glucose, UA: NEGATIVE
KETONES UA: NEGATIVE
Nitrite, UA: POSITIVE
PH UA: 6
Urobilinogen, UA: 0.2

## 2015-12-06 MED ORDER — SULFAMETHOXAZOLE-TRIMETHOPRIM 800-160 MG PO TABS: 1.0000 | ORAL_TABLET | Freq: Two times a day (BID) | ORAL | 0 refills | Status: DC

## 2015-12-06 NOTE — Patient Instructions (Addendum)
Take bactrim ds for uti  Drink lots of fluids We will culture your urine and update you with a result  Try to get 1200-1500 mg of calcium per day with at least 1000 iu of vitamin D - for bone health  Stop at check out for referral for dexa

## 2015-12-06 NOTE — Assessment & Plan Note (Signed)
This is recurrent though pt hopes it will subside after use of topical est cream for a while Pos ua tx with bactrim ds cx pending Enc fluids  Update if no improvement

## 2015-12-06 NOTE — Progress Notes (Signed)
Subjective:    Patient ID: Shannon [name], female    DOB: 08-24-1950, 65 y.o.   MRN: [id number]  HPI Here for health maintenance exam and to review chronic medical problems    Also new urinary symptoms  uti  She has had 4 uti in 5 months  Ever since her hysterectomy (and repair of prolapsed bladder)  She just started some estrogen cream 6 weeks ago  Symptoms since Saturday  Frequency and urgency and burning to urinate No blood in urine    ua is positive today   Had AMW with Lesia 11/24/15 Hep C screen completed-negative   Wt Readings from Last 3 Encounters:  12/06/15 159 lb 8 oz (72.3 kg)  11/24/15 157 lb 8 oz (71.4 kg)  10/17/15 160 lb (72.6 kg)  stable / she keeps a close eye on her weight -it goes up and down  She does walk for exercise 30-60 min most days  bmi is 23.9  dexa-has not had for a while  In aug -feel /flipped down the side of a mt hiking - and not injured   Had a metatarsal fracture stepping on a toy and twisting her ankle (not a fragility fracture)  That was 30 y ago  Takes mvi but not extra ca or D Does want a bone density  Mammogram 11/16- she has an appt on 11/27 Self breast exam -no lumps  Has not had exam from gyn lately   Colonoscopy 5/13-5 year recall  Will watch for a reminder in May   Results for orders placed or performed in visit on 12/06/15  POCT Urinalysis Dipstick (Automated)  Result Value Ref Range   Color, UA Yellow    Clarity, UA Hazy    Glucose, UA Negative    Bilirubin, UA Negative    Ketones, UA Negative    Spec Grav, UA >=1.030    Blood, UA 80 Ery/uL    pH, UA 6.0    Protein, UA Trace    Urobilinogen, UA 0.2    Nitrite, UA Positive    Leukocytes, UA moderate (2+) (A) Negative    Clinical Support on 11/24/2015  Component Date Value Ref Range Status  . WBC 11/24/2015 4.6  4.0 - 10.5 K/uL Final  . RBC 11/24/2015 4.61  3.87 - 5.11 Mil/uL Final  . Hemoglobin 11/24/2015 13.8  12.0 - 15.0 g/dL Final  . HCT 11/24/2015  40.8  36.0 - 46.0 % Final  . MCV 11/24/2015 88.3  78.0 - 100.0 fl Final  . MCHC 11/24/2015 33.8  30.0 - 36.0 g/dL Final  . RDW 11/24/2015 13.3  11.5 - 15.5 % Final  . Platelets 11/24/2015 249.0  150.0 - 400.0 K/uL Final  . Neutrophils Relative % 11/24/2015 58.8  43.0 - 77.0 % Final  . Lymphocytes Relative 11/24/2015 29.5  12.0 - 46.0 % Final  . Monocytes Relative 11/24/2015 9.4  3.0 - 12.0 % Final  . Eosinophils Relative 11/24/2015 1.7  0.0 - 5.0 % Final  . Basophils Relative 11/24/2015 0.6  0.0 - 3.0 % Final  . Neutro Abs 11/24/2015 2.7  1.4 - 7.7 K/uL Final  . Lymphs Abs 11/24/2015 1.4  0.7 - 4.0 K/uL Final  . Monocytes Absolute 11/24/2015 0.4  0.1 - 1.0 K/uL Final  . Eosinophils Absolute 11/24/2015 0.1  0.0 - 0.7 K/uL Final  . Basophils Absolute 11/24/2015 0.0  0.0 - 0.1 K/uL Final  . Sodium 11/24/2015 141  135 - 145 mEq/L Final  . Potassium  11/24/2015 4.6  3.5 - 5.1 mEq/L Final  . Chloride 11/24/2015 105  96 - 112 mEq/L Final  . CO2 11/24/2015 30  19 - 32 mEq/L Final  . Glucose, Bld 11/24/2015 105* 70 - 99 mg/dL Final  . BUN 11/24/2015 26* 6 - 23 mg/dL Final  . Creatinine, Ser 11/24/2015 0.75  0.40 - 1.20 mg/dL Final  . Total Bilirubin 11/24/2015 1.0  0.2 - 1.2 mg/dL Final  . Alkaline Phosphatase 11/24/2015 75  39 - 117 U/L Final  . AST 11/24/2015 19  0 - 37 U/L Final  . ALT 11/24/2015 16  0 - 35 U/L Final  . Total Protein 11/24/2015 6.8  6.0 - 8.3 g/dL Final  . Albumin 11/24/2015 4.0  3.5 - 5.2 g/dL Final  . Calcium 11/24/2015 9.4  8.4 - 10.5 mg/dL Final  . GFR 11/24/2015 81.84  >60.00 mL/min Final  . Cholesterol 11/24/2015 168  0 - 200 mg/dL Final  . Triglycerides 11/24/2015 45.0  0.0 - 149.0 mg/dL Final  . HDL 11/24/2015 85.40  >39.00 mg/dL Final  . VLDL 11/24/2015 9.0  0.0 - 40.0 mg/dL Final  . LDL Cholesterol 11/24/2015 74  0 - 99 mg/dL Final  . Total CHOL/HDL Ratio 11/24/2015 2   Final  . NonHDL 11/24/2015 82.62   Final  . TSH 11/24/2015 0.62  0.35 - 4.50 uIU/mL Final    . HCV Ab 11/25/2015 NEGATIVE  NEGATIVE Final   Great cholesterol profile!   Patient Active Problem List   Diagnosis Date Noted  . Cystitis 12/06/2015  . Estrogen deficiency 12/06/2015  . Elevated blood sugar 12/06/2015  . Post-operative state 07/04/2015  . Third degree uterine prolapse 04/12/2015  . Incontinence of urine in female 04/12/2015  . Pelvic relaxation due to uterine prolapse 03/27/2015  . UTI (urinary tract infection) 12/21/2014  . Right shoulder pain 12/02/2014  . Neck pain on right side 12/02/2014  . Thoracic back pain 12/02/2014  . Medicare annual wellness visit, initial 11/30/2014  . Routine general medical examination at a health care facility 11/05/2012  . Other screening mammogram 11/13/2011  . PLANTAR FASCIITIS, LEFT 07/28/2009  . NEOPLASM, SKIN, UNCERTAIN BEHAVIOR [id number]  . PERSONAL HX COLONIC POLYPS 11/02/2007  . ESOPHAGITIS, MILD 03/31/2007  . GERD 10/20/2006   Past Medical History:  Diagnosis Date  . Chronic neck and back pain    received physical therapy  . GERD (gastroesophageal reflux disease)    with esophagitis  . Numbness    Right outer thigh, constant, notices more with standing for a long period of time  . Plantar fasciitis    left   Past Surgical History:  Procedure Laterality Date  . BLADDER SUSPENSION N/A 07/04/2015   Procedure: TRANSVAGINAL TAPE (TVT) SLING                   ;  Surgeon: Emily Filbert, MD;  Location: [facility];  Service: Gynecology;  Laterality: N/A;  . COLONOSCOPY    . CYSTOCELE REPAIR N/A 07/04/2015   Procedure: ANTERIOR REPAIR (CYSTOCELE);  Surgeon: Emily Filbert, MD;  Location: [facility];  Service: Gynecology;  Laterality: N/A;  . CYSTOSCOPY N/A 07/04/2015   Procedure: CYSTOSCOPY;  Surgeon: Emily Filbert, MD;  Location: [facility];  Service: Gynecology;  Laterality: N/A;  . DILATION AND CURETTAGE OF UTERUS    . TUBAL LIGATION  1987  . UPPER GI ENDOSCOPY    . VAGINAL HYSTERECTOMY N/A 07/04/2015   Procedure: TOTAL VAGINAL  HYSTERECTOMY  ;  Surgeon: Emily Filbert, MD;  Location: [facility];  Service: Gynecology;  Laterality: N/A;   Social History  Substance Use Topics  . Smoking status: Never Smoker  . Smokeless tobacco: Never Used  . Alcohol use No     Comment: rare   Family History  Problem Relation Age of Onset  . Stroke Mother     x 2  . Heart disease Mother     congenital arrhythmia and CHF  . Depression Mother   . Prostate cancer Brother   . Stomach cancer Paternal Uncle   . Colon cancer Neg Hx   . Esophageal cancer Neg Hx   . Rectal cancer Neg Hx    Allergies  Allergen Reactions  . Neomycin-Bacitracin Zn-Polymyx Rash    Ointment   Current Outpatient Prescriptions on File Prior to Visit  Medication Sig Dispense Refill  . D-MANNOSE PO Take by mouth 2 (two) times daily.    Marland Kitchen ESTROGENS, CONJUGATED VA Place vaginally 3 (three) times a week.    . famotidine (PEPCID) 20 MG tablet Take 20 mg by mouth daily as needed for heartburn or indigestion.    Marland Kitchen MAGNESIUM PO Take 1 tablet by mouth at bedtime.    . Melatonin 5 MG CAPS Take 1 capsule by mouth at bedtime.      . Multiple Vitamin (MULTIVITAMIN) tablet Take 1 tablet by mouth daily.      . ST JOHNS WORT PO Take by mouth daily.     No current facility-administered medications on file prior to visit.     Review of Systems Review of Systems  Constitutional: Negative for fever, appetite change, fatigue and unexpected weight change.  Eyes: Negative for pain and visual disturbance.  Respiratory: Negative for cough and shortness of breath.   Cardiovascular: Negative for cp or palpitations    Gastrointestinal: Negative for nausea, diarrhea and constipation.  Genitourinary: pos for urgency and frequency. Neg for flank pain  Skin: Negative for pallor or rash   Neurological: Negative for weakness, light-headedness, numbness and headaches.  Hematological: Negative for adenopathy. Does not bruise/bleed easily.  Psychiatric/Behavioral: Negative for dysphoric  mood. The patient is not nervous/anxious.         Objective:   Physical Exam  Constitutional: She appears well-developed and well-nourished. No distress.  Well appearing  HENT:  Head: Normocephalic and atraumatic.  Right Ear: External ear normal.  Left Ear: External ear normal.  Mouth/Throat: Oropharynx is clear and moist.  Eyes: Conjunctivae and EOM are normal. Pupils are equal, round, and reactive to light. No scleral icterus.  Neck: Normal range of motion. Neck supple. No JVD present. Carotid bruit is not present. No thyromegaly present.  Cardiovascular: Normal rate, regular rhythm, normal heart sounds and intact distal pulses.  Exam reveals no gallop.   Pulmonary/Chest: Effort normal and breath sounds normal. No respiratory distress. She has no wheezes. She exhibits no tenderness.  Abdominal: Soft. Bowel sounds are normal. She exhibits no distension, no abdominal bruit and no mass. There is tenderness.  Mild suprapubic tenderness No cva tenderness  Genitourinary: No breast swelling, tenderness, discharge or bleeding.  Genitourinary Comments: Breast exam: No mass, nodules, thickening, tenderness, bulging, retraction, inflamation, nipple discharge or skin changes noted.  No axillary or clavicular LA.      Musculoskeletal: Normal range of motion. She exhibits no edema or tenderness.  Lymphadenopathy:    She has no cervical adenopathy.  Neurological: She is alert. She has normal reflexes. No cranial nerve deficit. She exhibits normal  muscle tone. Coordination normal.  Skin: Skin is warm and dry. No rash noted. No erythema. No pallor.  Some lentigines   Psychiatric: She has a normal mood and affect.          Assessment & Plan:   Problem List Items Addressed This Visit      Genitourinary   Cystitis    This is recurrent though pt hopes it will subside after use of topical est cream for a while Pos ua tx with bactrim ds cx pending Enc fluids  Update if no improvement       Relevant Orders   Urine culture     Other   Elevated blood sugar    Disc low glycemic diet Will watch over time  105 fasting       Estrogen deficiency   Relevant Orders   DG Bone Density   Routine general medical examination at a health care facility - Primary    Reviewed health habits including diet and exercise and skin cancer prevention Reviewed appropriate screening tests for age  Also reviewed health mt list, fam hx and immunization status , as well as social and family history   See HPI Rev AMW Rev labs Ref for dexa Disc ca and D recommendations  Enc further good habits Disc fall prev        Other Visit Diagnoses    Dysuria       Relevant Orders   POCT Urinalysis Dipstick (Automated) (Completed)

## 2015-12-06 NOTE — Assessment & Plan Note (Signed)
Disc low glycemic diet Will watch over time  105 fasting

## 2015-12-06 NOTE — Progress Notes (Signed)
Pre visit review using our clinic review tool, if applicable. No additional management support is needed unless otherwise documented below in the visit note. 

## 2015-12-06 NOTE — Assessment & Plan Note (Signed)
Reviewed health habits including diet and exercise and skin cancer prevention Reviewed appropriate screening tests for age  Also reviewed health mt list, fam hx and immunization status , as well as social and family history   See HPI Rev AMW Rev labs Ref for dexa Disc ca and D recommendations  Enc further good habits Disc fall prev

## 2015-12-09 LAB — URINE CULTURE: Colony Count: >=100000

## 2015-12-18 ENCOUNTER — Ambulatory Visit
Admission: RE | Admit: 2015-12-18 | Discharge: 2015-12-18 | Disposition: A | Discharge status: Home / Self Care | Payer: Medicare Other | Source: Ambulatory Visit | Attending: Family Medicine | Admitting: Family Medicine

## 2015-12-18 DIAGNOSIS — Z1231 Encounter for screening mammogram for malignant neoplasm of breast: Secondary | ICD-10-CM

## 2015-12-18 DIAGNOSIS — E2839 Other primary ovarian failure: Secondary | ICD-10-CM

## 2015-12-22 ENCOUNTER — Encounter: Payer: Self-pay | Admitting: Obstetrics & Gynecology

## 2015-12-22 ENCOUNTER — Ambulatory Visit (INDEPENDENT_AMBULATORY_CARE_PROVIDER_SITE_OTHER): Payer: Medicare Other | Admitting: Obstetrics & Gynecology

## 2015-12-22 VITALS — BP 113/70 | HR 73 | Temp 97.8°F | Wt 158.8 lb

## 2015-12-22 DIAGNOSIS — N390 Urinary tract infection, site not specified: Secondary | ICD-10-CM

## 2015-12-22 DIAGNOSIS — R3989 Other symptoms and signs involving the genitourinary system: Secondary | ICD-10-CM

## 2015-12-22 LAB — POCT URINALYSIS DIPSTICK
BILIRUBIN UA: NEGATIVE
Glucose, UA: NEGATIVE
Ketones, UA: NEGATIVE
LEUKOCYTES UA: NEGATIVE
NITRITE UA: NEGATIVE
PH UA: 5
Protein, UA: NEGATIVE
RBC UA: NEGATIVE
Spec Grav, UA: 1.015
UROBILINOGEN UA: NEGATIVE

## 2015-12-22 NOTE — Progress Notes (Signed)
   Subjective:    Patient ID: Shannon [name], female    DOB: [birth year], 65 y.o.   MRN: [id number]  HPI  65 yo MW lady here for a quick follow up after starting vaginal estrogen to try to stop her recurrent UTIs after having a TVH/Ant repair/sling. She is also using D mannose prn.  She reports that since starting the estrogen her OAB and nocturia has improved.  Review of Systems     Objective:   Physical Exam [id number] Breathing, conversing, and ambulating normally Vagina- normal mucosa, no evidence of sling erosion ua - negative today      Assessment & Plan:  Recurrent UTI- continue estrogen vaginally If another infection occurs, rec urology referral

## 2015-12-22 NOTE — Progress Notes (Signed)
Patient is in the office for follow up.

## 2016-03-11 ENCOUNTER — Ambulatory Visit: Payer: Self-pay | Admitting: Family Medicine

## 2016-03-28 ENCOUNTER — Encounter: Payer: Self-pay | Admitting: Obstetrics & Gynecology

## 2016-03-28 ENCOUNTER — Ambulatory Visit (INDEPENDENT_AMBULATORY_CARE_PROVIDER_SITE_OTHER): Payer: Medicare Other | Admitting: Obstetrics & Gynecology

## 2016-03-28 VITALS — BP 116/58 | HR 67 | Wt 162.0 lb

## 2016-03-28 DIAGNOSIS — N816 Rectocele: Secondary | ICD-10-CM

## 2016-03-28 NOTE — Progress Notes (Signed)
Pt has a history of a bladder prolapse, feels she now has a rectocele.  Has noticed a protrusion coming out of her vagina.

## 2016-03-28 NOTE — Progress Notes (Signed)
   Subjective:    Patient ID: Shannon [name], female    DOB: [birth year], 66 y.o.   MRN: [id number]  HPI  66 yo lady s/p TVH/Ant repair/sling here today with the issue of "I think I have a rectocele".  Review of Systems     Objective:   Physical Exam Rectocele, 3rd Some small amount of prolapse of the vaginal cuff I fitted her with a #3 ring with diaphragm.  I relieved her pressure and did not fall out or hurt.       Assessment & Plan:  Rectocele, symptomatic I have fitted her with a pessary which she will use while deciding if she wants a referral to Dr. [place] Pink.

## 2016-04-18 ENCOUNTER — Encounter: Payer: Self-pay | Admitting: Gastroenterology

## 2016-04-24 ENCOUNTER — Encounter: Payer: Self-pay | Admitting: Gastroenterology

## 2016-05-06 ENCOUNTER — Other Ambulatory Visit: Payer: Self-pay | Admitting: Obstetrics & Gynecology

## 2016-05-08 ENCOUNTER — Encounter: Payer: Self-pay | Admitting: *Deleted

## 2016-06-18 ENCOUNTER — Encounter: Payer: Self-pay | Admitting: Gastroenterology

## 2016-06-18 ENCOUNTER — Ambulatory Visit (AMBULATORY_SURGERY_CENTER): Payer: Self-pay

## 2016-06-18 VITALS — Ht 69.0 in | Wt 162.8 lb

## 2016-06-18 DIAGNOSIS — Z86010 Personal history of colonic polyps: Secondary | ICD-10-CM

## 2016-06-18 MED ORDER — NA SULFATE-K SULFATE-MG SULF 17.5-3.13-1.6 GM/177ML PO SOLN: 1.0000 | Freq: Once | ORAL | 0 refills | Status: AC

## 2016-06-18 NOTE — Progress Notes (Signed)
Denies allergies to eggs or soy products. Denies complication of anesthesia or sedation. Denies use of weight loss medication. Denies use of O2.   Emmi instructions given for colonoscopy.  

## 2016-07-01 ENCOUNTER — Encounter: Payer: Self-pay | Admitting: Gastroenterology

## 2016-07-01 ENCOUNTER — Ambulatory Visit (AMBULATORY_SURGERY_CENTER): Payer: Medicare Other | Admitting: Gastroenterology

## 2016-07-01 VITALS — BP 110/56 | HR 59 | Temp 97.5°F | Resp 13 | Ht 69.0 in | Wt 162.0 lb

## 2016-07-01 DIAGNOSIS — D122 Benign neoplasm of ascending colon: Secondary | ICD-10-CM

## 2016-07-01 DIAGNOSIS — D123 Benign neoplasm of transverse colon: Secondary | ICD-10-CM

## 2016-07-01 DIAGNOSIS — K5730 Diverticulosis of large intestine without perforation or abscess without bleeding: Secondary | ICD-10-CM | POA: Diagnosis not present

## 2016-07-01 DIAGNOSIS — Z86010 Personal history of colonic polyps: Secondary | ICD-10-CM

## 2016-07-01 DIAGNOSIS — D126 Benign neoplasm of colon, unspecified: Secondary | ICD-10-CM

## 2016-07-01 DIAGNOSIS — K635 Polyp of colon: Secondary | ICD-10-CM | POA: Diagnosis not present

## 2016-07-01 DIAGNOSIS — D125 Benign neoplasm of sigmoid colon: Secondary | ICD-10-CM

## 2016-07-01 MED ORDER — SODIUM CHLORIDE 0.9 % IV SOLN: 500.0000 mL | INTRAVENOUS | Status: DC

## 2016-07-01 NOTE — Progress Notes (Signed)
Pt's states no medical or surgical changes since previsit or office visit. 

## 2016-07-01 NOTE — Progress Notes (Signed)
Called to room to assist during endoscopic procedure.  Patient ID and intended procedure confirmed with present staff. Received instructions for my participation in the procedure from the performing physician.  

## 2016-07-01 NOTE — Progress Notes (Signed)
Report given to PACU, vss 

## 2016-07-01 NOTE — Patient Instructions (Signed)
YOU HAD AN ENDOSCOPIC PROCEDURE TODAY AT [facility]:   Refer to the procedure report that was given to you for any specific questions about what was found during the examination.  If the procedure report does not answer your questions, please call your gastroenterologist to clarify.  If you requested that your care partner not be given the details of your procedure findings, then the procedure report has been included in a sealed envelope for you to review at your convenience later.  YOU SHOULD EXPECT: Some feelings of bloating in the abdomen. Passage of more gas than usual.  Walking can help get rid of the air that was put into your GI tract during the procedure and reduce the bloating. If you had a lower endoscopy (such as a colonoscopy or flexible sigmoidoscopy) you may notice spotting of blood in your stool or on the toilet paper. If you underwent a bowel prep for your procedure, you may not have a normal bowel movement for a few days.  Please Note:  You might notice some irritation and congestion in your nose or some drainage.  This is from the oxygen used during your procedure.  There is no need for concern and it should clear up in a day or so.  SYMPTOMS TO REPORT IMMEDIATELY:   Following lower endoscopy (colonoscopy or flexible sigmoidoscopy):  Excessive amounts of blood in the stool  Significant tenderness or worsening of abdominal pains  Swelling of the abdomen that is new, acute  Fever of 100F or higher  Please read all handouts given to you by your recovery nurse.  For urgent or emergent issues, a gastroenterologist can be reached at any hour by calling [phone].   DIET:  We do recommend a small meal at first, but then you may proceed to your regular diet.  Drink plenty of fluids but you should avoid alcoholic beverages for 24 hours.  ACTIVITY:  You should plan to take it easy for the rest of today and you should NOT DRIVE or use heavy machinery until  tomorrow (because of the sedation medicines used during the test).    FOLLOW UP: Our staff will call the number listed on your records the next business day following your procedure to check on you and address any questions or concerns that you may have regarding the information given to you following your procedure. If we do not reach you, we will leave a message.  However, if you are feeling well and you are not experiencing any problems, there is no need to return our call.  We will assume that you have returned to your regular daily activities without incident.  If any biopsies were taken you will be contacted by phone or by letter within the next 1-3 weeks.  Please call us at [phone] if you have not heard about the biopsies in 3 weeks.    SIGNATURES/CONFIDENTIALITY: You and/or your care partner have signed paperwork which will be entered into your electronic medical record.  These signatures attest to the fact that that the information above on your After Visit Summary has been reviewed and is understood.  Full responsibility of the confidentiality of this discharge information lies with you and/or your care-partner.  Thank you for letting us take care of your  Healthcare needs today.

## 2016-07-01 NOTE — Op Note (Signed)
[facility] Patient Name: Shannon [name] Procedure Date: 07/01/2016 8:01 AM MRN: [id number] Endoscopist: Milus Banister , MD Age: 66 Referring MD:  Date of Birth: May 21, 1950 Gender: Female Account #: [id number] Procedure:                Colonoscopy Indications:              High risk colon cancer surveillance: Personal                            history of colonic polyps: 3 subCM Colonic tubular                            adenomas May 2008. Repeat colonoscopy Ardis Hughs)                            05/2011 found small TA; Medicines:                Monitored Anesthesia Care Procedure:                Pre-Anesthesia Assessment:                           - Prior to the procedure, a History and Physical                            was performed, and patient medications and                            allergies were reviewed. The patient's tolerance of                            previous anesthesia was also reviewed. The risks                            and benefits of the procedure and the sedation                            options and risks were discussed with the patient.                            All questions were answered, and informed consent                            was obtained. Prior Anticoagulants: The patient has                            taken no previous anticoagulant or antiplatelet                            agents. ASA Grade Assessment: II - A patient with                            mild systemic disease. After reviewing the risks  and benefits, the patient was deemed in                            satisfactory condition to undergo the procedure.                           After obtaining informed consent, the colonoscope                            was passed under direct vision. Throughout the                            procedure, the patient's blood pressure, pulse, and                            oxygen saturations were monitored  continuously. The                            Colonoscope was introduced through the anus and                            advanced to the the cecum, identified by                            appendiceal orifice and ileocecal valve. The                            colonoscopy was performed without difficulty. The                            patient tolerated the procedure well. The quality                            of the bowel preparation was excellent. The                            ileocecal valve, appendiceal orifice, and rectum                            were photographed. Scope In: 8:06:23 AM Scope Out: 8:21:22 AM Scope Withdrawal Time: 0 hours 11 minutes 27 seconds  Total Procedure Duration: 0 hours 14 minutes 59 seconds  Findings:                 Four sessile polyps were found in the sigmoid                            colon, transverse colon and ascending colon. The                            polyps were 3 to 5 mm in size. These polyps were                            removed with a cold snare. Resection and retrieval  were complete.                           Left sided diverticulosis.                           The exam was otherwise without abnormality on                            direct and retroflexion views. Complications:            No immediate complications. Estimated Blood Loss:     Estimated blood loss: none. Impression:               - Four 3 to 5 mm polyps in the sigmoid colon, in                            the transverse colon and in the ascending colon,                            removed with a cold snare. Resected and retrieved.                           - Left sided diverticulosis.                           - The examination was otherwise normal on direct                            and retroflexion views. Recommendation:           - Patient has a contact number available for                            emergencies. The signs and symptoms of  potential                            delayed complications were discussed with the                            patient. Return to normal activities tomorrow.                            Written discharge instructions were provided to the                            patient.                           - Resume previous diet.                           - Continue present medications.                           You will receive a letter within 2-3 weeks with the  pathology results and my final recommendations.                           If the polyp(s) is proven to be 'pre-cancerous' on                            pathology, you will need repeat colonoscopy in 3-5                            years. Milus Banister, MD 07/01/2016 8:24:33 AM This report has been signed electronically.

## 2016-07-02 ENCOUNTER — Telehealth: Payer: Self-pay | Admitting: *Deleted

## 2016-07-02 NOTE — Telephone Encounter (Signed)
Second call. No answer. Left message to call if questions or concerns.

## 2016-07-02 NOTE — Telephone Encounter (Signed)
No answer, left message to call if questions or concerns. 

## 2016-07-08 ENCOUNTER — Encounter: Payer: Self-pay | Admitting: Gastroenterology

## 2016-07-18 ENCOUNTER — Ambulatory Visit (INDEPENDENT_AMBULATORY_CARE_PROVIDER_SITE_OTHER): Payer: Medicare Other | Admitting: Family Medicine

## 2016-07-18 ENCOUNTER — Encounter: Payer: Self-pay | Admitting: Family Medicine

## 2016-07-18 DIAGNOSIS — L03011 Cellulitis of right finger: Secondary | ICD-10-CM

## 2016-07-18 MED ORDER — CEPHALEXIN 500 MG PO CAPS: 500.0000 mg | ORAL_CAPSULE | Freq: Three times a day (TID) | ORAL | 0 refills | Status: DC

## 2016-07-18 NOTE — Progress Notes (Addendum)
   Subjective:    Patient ID: Shannon [name], female    DOB: 07/29/1950, 66 y.o.   MRN: [id number]  HPI    66 year old female patient of Dr. Marliss Coots presents with new onset redness at base of right 5th digit. Soreness, redness and swelling in last  24 hours.  No itchiness. No fver. No N/V.  No flulike symtpoms     She has recurrent issues in right finger 5th digit. Thought cleared it up 10 days ago.  Soaks, applying topical ointment.   At that time some discharge.. Yellow removed.  No MRSA exposure   No hx of skin issues. Some streaking in nail on that finger.   Review of Systems  Constitutional: Negative for fatigue and fever.  HENT: Negative for ear pain.   Eyes: Negative for pain.  Respiratory: Negative for chest tightness and shortness of breath.   Cardiovascular: Negative for chest pain, palpitations and leg swelling.  Gastrointestinal: Negative for abdominal pain.  Genitourinary: Negative for dysuria.       Objective:   Physical Exam  Constitutional: Vital signs are normal. She appears well-developed and well-nourished. She is cooperative.  Non-toxic appearance. She does not appear ill. No distress.  HENT:  Head: Normocephalic.  Right Ear: Hearing, tympanic membrane, external ear and ear canal normal. Tympanic membrane is not erythematous, not retracted and not bulging.  Left Ear: Hearing, tympanic membrane, external ear and ear canal normal. Tympanic membrane is not erythematous, not retracted and not bulging.  Nose: No mucosal edema or rhinorrhea. Right sinus exhibits no maxillary sinus tenderness and no frontal sinus tenderness. Left sinus exhibits no maxillary sinus tenderness and no frontal sinus tenderness.  Mouth/Throat: Uvula is midline, oropharynx is clear and moist and mucous membranes are normal.  Eyes: Conjunctivae, EOM and lids are normal. Pupils are equal, round, and reactive to light. Lids are everted and swept, no foreign bodies found.  Neck: Trachea  normal and normal range of motion. Neck supple. Carotid bruit is not present. No thyroid mass and no thyromegaly present.  Cardiovascular: Normal rate, regular rhythm, S1 normal, S2 normal, normal heart sounds, intact distal pulses and normal pulses.  Exam reveals no gallop and no friction rub.   No murmur heard. Pulmonary/Chest: Effort normal and breath sounds normal. No tachypnea. No respiratory distress. She has no decreased breath sounds. She has no wheezes. She has no rhonchi. She has no rales.  Abdominal: Soft. Normal appearance and bowel sounds are normal. There is no tenderness.  Neurological: She is alert.  Skin: Skin is warm, dry and intact. Rash noted.  Erythema, mild swelling, slight tenderness, no fluctuance at base of  Nail bed on right 5th digit  Psychiatric: Her speech is normal and behavior is normal. Judgment and thought content normal. Her mood appears not anxious. Cognition and memory are normal. She does not exhibit a depressed mood.          Assessment & Plan:

## 2016-07-18 NOTE — Patient Instructions (Addendum)
Complete 7  Days of antibiotics.  If not resolving follow up for re-evaluation and consideration of fungal infection involvement.  Call if fever of not toleration the antibitoics.  Keep hands dry.

## 2016-07-18 NOTE — Assessment & Plan Note (Signed)
BActerial vs fungal vs inflammatory issue at nail bed.   Given pt history of discharge in past.. Will begin by treating with oral antibiotics.. No fluctuance suggesting abcess and need for I and D. If not resolving consider chronic fungal infeciton vs psoriasis etc.

## 2016-07-26 ENCOUNTER — Ambulatory Visit (INDEPENDENT_AMBULATORY_CARE_PROVIDER_SITE_OTHER): Payer: Medicare Other | Admitting: Family Medicine

## 2016-07-26 ENCOUNTER — Encounter: Payer: Self-pay | Admitting: Family Medicine

## 2016-07-26 DIAGNOSIS — L03019 Cellulitis of unspecified finger: Secondary | ICD-10-CM | POA: Insufficient documentation

## 2016-07-26 DIAGNOSIS — L03011 Cellulitis of right finger: Secondary | ICD-10-CM

## 2016-07-26 MED ORDER — TRIAMCINOLONE ACETONIDE 0.5 % EX CREA: 1.0000 "application " | TOPICAL_CREAM | Freq: Two times a day (BID) | CUTANEOUS | 0 refills | Status: DC

## 2016-07-26 NOTE — Patient Instructions (Signed)
Apply topical steroid twice daily x 2-4 weeks.  try to keep hands as dry as possible.

## 2016-07-26 NOTE — Assessment & Plan Note (Addendum)
Minimal improvement with antibiotics.  Reviewed uptodate on treatment.. Likely chronic inflammation and possible intermittent fungal or bacterial infection... Recommended 2-4 weeks of topical steroid application high potency as opposed to antimicrobial treatment. Keep hands dry.

## 2016-07-26 NOTE — Progress Notes (Signed)
   Subjective:    Patient ID: Shannon [name], female    DOB: 19-Jan-1951, 66 y.o.   MRN: [id number]  HPI   66 year old female pt of Dr.Tower's presents for follow up on  Nail bed changes.  She was seen on 6/28... Treated for possible bacterial infection at base of nail bed with keflex x 7 days.  Today she reports her nail bed is still red, minimally different.  She feels like there was slight improvement in redness and pain.   No redness spreading, no further discharge.  No other fingers or nail effected.  He symptoms upon review are recurrent and chronic (ongoing for 6 months) .Marland Kitchen Like;ly related to chronically wet hands.   Review of Systems  Constitutional: Negative for fatigue and fever.  HENT: Negative for ear pain.   Eyes: Negative for pain.  Respiratory: Negative for chest tightness and shortness of breath.   Cardiovascular: Negative for chest pain, palpitations and leg swelling.  Gastrointestinal: Negative for abdominal pain.  Genitourinary: Negative for dysuria.       Objective:   Physical Exam  Constitutional: Vital signs are normal. She appears well-developed and well-nourished. She is cooperative.  Non-toxic appearance. She does not appear ill. No distress.  HENT:  Head: Normocephalic.  Right Ear: Hearing, tympanic membrane, external ear and ear canal normal. Tympanic membrane is not erythematous, not retracted and not bulging.  Left Ear: Hearing, tympanic membrane, external ear and ear canal normal. Tympanic membrane is not erythematous, not retracted and not bulging.  Nose: No mucosal edema or rhinorrhea. Right sinus exhibits no maxillary sinus tenderness and no frontal sinus tenderness. Left sinus exhibits no maxillary sinus tenderness and no frontal sinus tenderness.  Mouth/Throat: Uvula is midline, oropharynx is clear and moist and mucous membranes are normal.  Eyes: Conjunctivae, EOM and lids are normal. Pupils are equal, round, and reactive to light. Lids are  everted and swept, no foreign bodies found.  Neck: Trachea normal and normal range of motion. Neck supple. Carotid bruit is not present. No thyroid mass and no thyromegaly present.  Cardiovascular: Normal rate, regular rhythm, S1 normal, S2 normal, normal heart sounds, intact distal pulses and normal pulses.  Exam reveals no gallop and no friction rub.   No murmur heard. Pulmonary/Chest: Effort normal and breath sounds normal. No tachypnea. No respiratory distress. She has no decreased breath sounds. She has no wheezes. She has no rhonchi. She has no rales.  Abdominal: Soft. Normal appearance and bowel sounds are normal. There is no tenderness.  Neurological: She is alert.  Skin: Skin is warm, dry and intact. Rash noted.  Erythema, mild swelling, slight tenderness, no fluctuance at base of  Nail bed on right 5th digit  Psychiatric: Her speech is normal and behavior is normal. Judgment and thought content normal. Her mood appears not anxious. Cognition and memory are normal. She does not exhibit a depressed mood.          Assessment & Plan:

## 2016-08-12 ENCOUNTER — Telehealth: Payer: Medicare Other | Admitting: Nurse Practitioner

## 2016-08-12 DIAGNOSIS — N390 Urinary tract infection, site not specified: Secondary | ICD-10-CM

## 2016-08-12 MED ORDER — NITROFURANTOIN MONOHYD MACRO 100 MG PO CAPS: 100.0000 mg | ORAL_CAPSULE | Freq: Two times a day (BID) | ORAL | 0 refills | Status: AC

## 2016-08-12 MED ORDER — PHENAZOPYRIDINE HCL 100 MG PO TABS: 100.0000 mg | ORAL_TABLET | Freq: Three times a day (TID) | ORAL | 0 refills | Status: AC | PRN

## 2016-08-12 NOTE — Progress Notes (Signed)
We are sorry that you are not feeling well.  Here is how we plan to help!  Based on what you shared with me it looks like you most likely have a simple urinary tract infection.  A UTI (Urinary Tract Infection) is a bacterial infection of the bladder.  Most cases of urinary tract infections are simple to treat but a key part of your care is to encourage you to drink plenty of fluids and watch your symptoms carefully.  I have prescribed MacroBid 100 mg twice a day for 5 days.  Your symptoms should gradually improve.  I am also prescribing Pyridium 100mg  three times daily as needed for your painful with urination.  Call us if the burning in your urine worsens, you develop worsening fever, back pain or pelvic pain or if your symptoms do not resolve after completing the antibiotic.  Urinary tract infections can be prevented by drinking plenty of water to keep your body hydrated.  Also be sure when you wipe, wipe from front to back and don't hold it in!  If possible, empty your bladder every 4 hours.  Your e-visit answers were reviewed by a board certified advanced clinical practitioner to complete your personal care plan.  Depending on the condition, your plan could have included both over the counter or prescription medications.  If there is a problem please reply  once you have received a response from your provider.  Your safety is important to [name].  If you have drug allergies check your prescription carefully.    You can use MyChart to ask questions about today's visit, request a non-urgent call back, or ask for a work or school excuse for 24 hours related to this e-Visit. If it has been greater than 24 hours you will need to follow up with your provider, or enter a new e-Visit to address those concerns.   You will get an e-mail in the next two days asking about your experience.  I hope that your e-visit has been valuable and will speed your recovery. Thank you for using e-visits.

## 2016-08-14 ENCOUNTER — Encounter: Payer: Self-pay | Admitting: Family Medicine

## 2016-08-17 ENCOUNTER — Encounter: Payer: Self-pay | Admitting: Family Medicine

## 2016-08-18 MED ORDER — NITROFURANTOIN MONOHYD MACRO 100 MG PO CAPS: 100.0000 mg | ORAL_CAPSULE | Freq: Two times a day (BID) | ORAL | 0 refills | Status: DC

## 2016-08-23 ENCOUNTER — Ambulatory Visit (INDEPENDENT_AMBULATORY_CARE_PROVIDER_SITE_OTHER): Payer: Medicare Other | Admitting: Family Medicine

## 2016-08-23 ENCOUNTER — Encounter: Payer: Self-pay | Admitting: Family Medicine

## 2016-08-23 VITALS — BP 100/70 | HR 70 | Ht 68.5 in | Wt 159.0 lb

## 2016-08-23 DIAGNOSIS — N390 Urinary tract infection, site not specified: Secondary | ICD-10-CM

## 2016-08-23 DIAGNOSIS — R300 Dysuria: Secondary | ICD-10-CM

## 2016-08-23 DIAGNOSIS — N3000 Acute cystitis without hematuria: Secondary | ICD-10-CM

## 2016-08-23 LAB — POC URINALSYSI DIPSTICK (AUTOMATED)
Glucose, UA: NEGATIVE
KETONES UA: NEGATIVE
Nitrite, UA: POSITIVE
PH UA: 5 (ref 5.0–8.0)
PROTEIN UA: NEGATIVE
RBC UA: NEGATIVE
Urobilinogen, UA: 2 E.U./dL — AB

## 2016-08-23 MED ORDER — NITROFURANTOIN MONOHYD MACRO 100 MG PO CAPS: ORAL_CAPSULE | ORAL | 0 refills | Status: DC

## 2016-08-23 NOTE — Patient Instructions (Addendum)
Drink lots of fluids (mostly water)  Aim for 64 oz of fluid day   Take the macrobid 1 pill twice daily for 7 days and the continue 1 pill daily  We will refer you to urology    If no improvement update [name] (or if worse)  We will get back to you with the culture result

## 2016-08-23 NOTE — Progress Notes (Signed)
Subjective:    Patient ID: Shannon [name], female    DOB: April 18, 1950, 66 y.o.   MRN: [id number]  HPI Here for recurrent uti  Burning and urgency and frequency  Got totally better after several doses of macrobid    Then once she finished the 5 d got better    Had uti early in July- keflex (husbands') Got better Then got better  Then got another one     Wt Readings from Last 3 Encounters:  08/23/16 159 lb (72.1 kg)  07/26/16 162 lb (73.5 kg)  07/18/16 161 lb 8 oz (73.3 kg)     ua today  Results for orders placed or performed in visit on 08/23/16  POCT Urinalysis Dipstick (Automated)  Result Value Ref Range   Color, UA Yellow    Clarity, UA Clear    Glucose, UA Negative    Bilirubin, UA 2+    Ketones, UA neg    Spec Grav, UA >=1.030 (A) 1.010 - 1.025   Blood, UA negative    pH, UA 5.0 5.0 - 8.0   Protein, UA negative    Urobilinogen, UA 2.0 (A) 0.2 or 1.0 E.U./dL   Nitrite, UA positive    Leukocytes, UA Moderate (2+) (A) Negative     Completed a round of macromid (5d) on Sunday   She had e visit for uti 7/23 tx with macrobid Has been getting them frequently   Uses estrogen vaginal cream   Going to [place] in 2 weeks   Patient Active Problem List   Diagnosis Date Noted  . UTI (urinary tract infection) 08/23/2016  . Recurrent UTI 08/23/2016  . Chronic paronychia of finger 07/26/2016  . Infection of nail bed of finger, right 07/18/2016  . Cystitis 12/06/2015  . Estrogen deficiency 12/06/2015  . Elevated blood sugar 12/06/2015  . Post-operative state 07/04/2015  . Third degree uterine prolapse 04/12/2015  . Incontinence of urine in female 04/12/2015  . Pelvic relaxation due to uterine prolapse 03/27/2015  . Right shoulder pain 12/02/2014  . Neck pain on right side 12/02/2014  . Thoracic back pain 12/02/2014  . Medicare annual wellness visit, initial 11/30/2014  . Routine general medical examination at a health care facility 11/05/2012  . Other  screening mammogram 11/13/2011  . PLANTAR FASCIITIS, LEFT 07/28/2009  . PERSONAL HX COLONIC POLYPS 11/02/2007  . ESOPHAGITIS, MILD 03/31/2007  . GERD 10/20/2006   Past Medical History:  Diagnosis Date  . Blood transfusion without reported diagnosis   . Chronic neck and back pain    received physical therapy  . GERD (gastroesophageal reflux disease)    with esophagitis  . Numbness    Right outer thigh, constant, notices more with standing for a long period of time  . Plantar fasciitis    left   Past Surgical History:  Procedure Laterality Date  . BLADDER SUSPENSION N/A 07/04/2015   Procedure: TRANSVAGINAL TAPE (TVT) SLING                   ;  Surgeon: Emily Filbert, MD;  Location: [facility];  Service: Gynecology;  Laterality: N/A;  . COLONOSCOPY    . CYSTOCELE REPAIR N/A 07/04/2015   Procedure: ANTERIOR REPAIR (CYSTOCELE);  Surgeon: Emily Filbert, MD;  Location: [facility];  Service: Gynecology;  Laterality: N/A;  . CYSTOSCOPY N/A 07/04/2015   Procedure: CYSTOSCOPY;  Surgeon: Emily Filbert, MD;  Location: [facility];  Service: Gynecology;  Laterality: N/A;  . DILATION AND CURETTAGE  OF UTERUS    . POLYPECTOMY    . TUBAL LIGATION  1987  . UPPER GI ENDOSCOPY    . VAGINAL HYSTERECTOMY N/A 07/04/2015   Procedure: TOTAL VAGINAL HYSTERECTOMY  ;  Surgeon: Emily Filbert, MD;  Location: [facility];  Service: Gynecology;  Laterality: N/A;   Social History  Substance Use Topics  . Smoking status: Never Smoker  . Smokeless tobacco: Never Used  . Alcohol use No     Comment: rare   Family History  Problem Relation Age of Onset  . Stroke Mother        x 2  . Heart disease Mother        congenital arrhythmia and CHF  . Depression Mother   . Prostate cancer Brother   . Stomach cancer Paternal Uncle   . Colon cancer Neg Hx   . Esophageal cancer Neg Hx    Allergies  Allergen Reactions  . Neomycin-Bacitracin Zn-Polymyx Rash    Ointment   Current Outpatient Prescriptions on File Prior to Visit  Medication  Sig Dispense Refill  . D-MANNOSE PO Take by mouth 2 (two) times daily.    Marland Kitchen docusate sodium (COLACE) 250 MG capsule Take 250 mg by mouth daily.    Marland Kitchen ESTROGENS, CONJUGATED VA Place vaginally 3 (three) times a week.    . famotidine (PEPCID) 20 MG tablet Take 20 mg by mouth daily as needed for heartburn or indigestion.    . magnesium gluconate (MAGONATE) 500 MG tablet Take 1,000 mg by mouth daily.    . Misc Natural Products (PUMPKIN SEED OIL PO) Take 2,000 mg by mouth 2 (two) times daily.    . Multiple Vitamin (MULTIVITAMIN) tablet Take 1 tablet by mouth daily.      . nitrofurantoin, macrocrystal-monohydrate, (MACROBID) 100 MG capsule Take 1 capsule (100 mg total) by mouth 2 (two) times daily. 10 capsule 0  . St Johns Wort 300 MG TABS Take 2 tablets by mouth 2 (two) times daily.    Marland Kitchen triamcinolone cream (KENALOG) 0.5 % Apply 1 application topically 2 (two) times daily. 30 g 0   Current Facility-Administered Medications on File Prior to Visit  Medication Dose Route Frequency Provider Last Rate Last Dose  . 0.9 %  sodium chloride infusion  500 mL Intravenous Continuous Milus Banister, MD        Review of Systems  Constitutional: Positive for fatigue. Negative for activity change, appetite change and fever.  HENT: Negative for congestion and sore throat.   Eyes: Negative for itching and visual disturbance.  Respiratory: Negative for cough and shortness of breath.   Cardiovascular: Negative for leg swelling.  Gastrointestinal: Negative for abdominal distention, abdominal pain, constipation, diarrhea and nausea.  Endocrine: Negative for cold intolerance and polydipsia.  Genitourinary: Positive for dysuria, frequency and urgency. Negative for difficulty urinating, flank pain and hematuria.  Musculoskeletal: Negative for myalgias.  Skin: Negative for rash.  Allergic/Immunologic: Negative for immunocompromised state.  Neurological: Negative for dizziness and weakness.  Hematological: Negative for  adenopathy.       Objective:   Physical Exam  Constitutional: She appears well-developed and well-nourished. No distress.  Well appearing  HENT:  Head: Normocephalic and atraumatic.  Eyes: Pupils are equal, round, and reactive to light. Conjunctivae and EOM are normal.  Neck: Normal range of motion. Neck supple.  Cardiovascular: Normal rate, regular rhythm and normal heart sounds.   Pulmonary/Chest: Effort normal and breath sounds normal.  Abdominal: Soft. Bowel sounds are normal. She exhibits  no distension. There is tenderness. There is no rebound.  No cva tenderness  Mild suprapubic tenderness  Musculoskeletal: She exhibits no edema.  Lymphadenopathy:    She has no cervical adenopathy.  Neurological: She is alert.  Skin: No rash noted.  Psychiatric: She has a normal mood and affect.          Assessment & Plan:   Problem List Items Addressed This Visit      Genitourinary   Recurrent UTI    Recent frequent utis relieved by abx  ? If related to sexual activity Culture sent tx with macrobid 100 mg bid for 7 d and then daily for prophylaxis for her trip  Ref to urology  She already uses vaginal estrogen cream  Disc imp of fluid intake      Relevant Medications   nitrofurantoin, macrocrystal-monohydrate, (MACROBID) 100 MG capsule   Other Relevant Orders   Ambulatory referral to Urology   UTI (urinary tract infection) - Primary    macrobid bid 7 d and then daily for prophylaxis  ? Recurrent vs chronic      Relevant Medications   nitrofurantoin, macrocrystal-monohydrate, (MACROBID) 100 MG capsule   Other Relevant Orders   Urine Culture (Completed)    Other Visit Diagnoses    Dysuria       Relevant Orders   POCT Urinalysis Dipstick (Automated) (Completed)

## 2016-08-24 LAB — URINE CULTURE

## 2016-08-25 NOTE — Assessment & Plan Note (Signed)
macrobid bid 7 d and then daily for prophylaxis  ? Recurrent vs chronic

## 2016-08-25 NOTE — Assessment & Plan Note (Signed)
Recent frequent utis relieved by abx  ? If related to sexual activity Culture sent tx with macrobid 100 mg bid for 7 d and then daily for prophylaxis for her trip  Ref to urology  She already uses vaginal estrogen cream  Disc imp of fluid intake

## 2016-09-17 NOTE — Progress Notes (Signed)
09/18/2016 1:50 PM   Shannon [name] October 19, 1950 [id number]  Referring provider: Abner Greenspan, MD [address] [place], [place] [address]  Chief Complaint  Patient presents with  . New Patient (Initial Visit)    recurrent uti Referred by Loura Pardon MD    HPI: Patient is a 65 -year-old Caucasian female who is referred to [name] by, Dr. Loura Pardon, for recurrent urinary tract infections.  Patient states that she has had 7 urinary tract infections over the last year.  3 episodes back to back in July.  Once daily Macrobid stopped on Monday.    Reviewing her records,  she has had one documented urinary tract infection over the last year.    Her symptoms with a urinary tract infection consist of frequency, urgency (she would like to try the Myrbetriq, but she is fearful her insurance will not cover the medication), dysuria, pain and discomfort in bladder.    She denies gross hematuria, back pain, abdominal pain or flank pain. She has not had any recent fevers, chills, nausea or vomiting.   She had a pessary placed in 03/2016.  Pessary is removed every night.  She underwent a sling procedure for SUI at the time of her hysterectomy.  She does not have a history of nephrolithiasis, GU surgery or GU trauma.     She is sexually active.  She has noted a correlation with her urinary tract infections and sexual intercourse.    She does not engage in anal sex.   She is postmenopausal.   She denies constipation and/or diarrhea.  She does engage in good perineal hygiene. She does not take tub baths.   She has/does not have incontinence.  She is using incontinence pads one or two a day.    She has not had any recent imaging studies.    She is drinking enough water to make her urine pale.  No sodas and no teas.  Occasional herb tea and almond mild.  Using a vaginal estrogen cream since last November three days a week.    Reviewed referral notes   PMH: Past Medical History:    Diagnosis Date  . Blood transfusion without reported diagnosis   . Chronic neck and back pain    received physical therapy  . GERD (gastroesophageal reflux disease)    with esophagitis  . Numbness    Right outer thigh, constant, notices more with standing for a long period of time  . Plantar fasciitis    left    Surgical History: Past Surgical History:  Procedure Laterality Date  . BLADDER SUSPENSION N/A 07/04/2015   Procedure: TRANSVAGINAL TAPE (TVT) SLING                   ;  Surgeon: Emily Filbert, MD;  Location: [facility];  Service: Gynecology;  Laterality: N/A;  . COLONOSCOPY    . CYSTOCELE REPAIR N/A 07/04/2015   Procedure: ANTERIOR REPAIR (CYSTOCELE);  Surgeon: Emily Filbert, MD;  Location: [facility];  Service: Gynecology;  Laterality: N/A;  . CYSTOSCOPY N/A 07/04/2015   Procedure: CYSTOSCOPY;  Surgeon: Emily Filbert, MD;  Location: [facility];  Service: Gynecology;  Laterality: N/A;  . DILATION AND CURETTAGE OF UTERUS    . POLYPECTOMY    . TUBAL LIGATION  1987  . UPPER GI ENDOSCOPY    . VAGINAL HYSTERECTOMY N/A 07/04/2015   Procedure: TOTAL VAGINAL HYSTERECTOMY  ;  Surgeon: Emily Filbert, MD;  Location:  [facility];  Service: Gynecology;  Laterality: N/A;    Home Medications:  Allergies as of 09/18/2016      Reactions   Neomycin-bacitracin Zn-polymyx Rash   Ointment      Medication List       Accurate as of 09/18/16  1:50 PM. Always use your most recent med list.          D-MANNOSE PO Take by mouth 2 (two) times daily.   docusate sodium 250 MG capsule Commonly known as:  COLACE Take 250 mg by mouth daily.   Estriol Powd INSERT 1 GRAM VAGINALLY THREE TIMES A WEEK AS DIRECTED.   ESTROGENS, CONJUGATED VA Place vaginally 3 (three) times a week.   famotidine 20 MG tablet Commonly known as:  PEPCID Take 20 mg by mouth daily as needed for heartburn or indigestion.   magnesium gluconate 500 MG tablet Commonly known as:  MAGONATE Take 1,000 mg by mouth daily.   multivitamin  tablet Take 1 tablet by mouth daily.   nitrofurantoin (macrocrystal-monohydrate) 100 MG capsule Commonly known as:  MACROBID Take 1 capsule (100 mg total) by mouth 2 (two) times daily.   nitrofurantoin (macrocrystal-monohydrate) 100 MG capsule Commonly known as:  MACROBID Take 1 pill by mouth twice daily for 7 days and then once daily   PUMPKIN SEED OIL PO Take 2,000 mg by mouth 2 (two) times daily.   [place] PRO-B PO Take by mouth daily.   St Johns Wort 300 MG Tabs Take 2 tablets by mouth 2 (two) times daily.   triamcinolone cream 0.5 % Commonly known as:  KENALOG Apply 1 application topically 2 (two) times daily.            Discharge Care Instructions        Start     Ordered   09/18/16 0000  BLADDER SCAN AMB NON-IMAGING     09/18/16 1310      Allergies:  Allergies  Allergen Reactions  . Neomycin-Bacitracin Zn-Polymyx Rash    Ointment    Family History: Family History  Problem Relation Age of Onset  . Stroke Mother        x 2  . Heart disease Mother        congenital arrhythmia and CHF  . Depression Mother   . Prostate cancer Brother   . Stomach cancer Paternal Uncle   . Colon cancer Neg Hx   . Esophageal cancer Neg Hx   . Kidney cancer Neg Hx   . Bladder Cancer Neg Hx     Social History:  reports that she has never smoked. She has never used smokeless tobacco. She reports that she does not drink alcohol or use drugs.  ROS: UROLOGY Frequent Urination?: Yes Hard to postpone urination?: Yes Burning/pain with urination?: No Get up at night to urinate?: Yes Leakage of urine?: Yes Urine stream starts and stops?: No Trouble starting stream?: No Do you have to strain to urinate?: No Blood in urine?: No Urinary tract infection?: Yes Sexually transmitted disease?: No Injury to kidneys or bladder?: No Painful intercourse?: No Weak stream?: Yes Currently pregnant?: No Vaginal bleeding?: No Last menstrual period?:  2000  Gastrointestinal Nausea?: No Vomiting?: No Indigestion/heartburn?: No Diarrhea?: No Constipation?: No  Constitutional Fever: No Night sweats?: No Weight loss?: No Fatigue?: No  Skin Skin rash/lesions?: No Itching?: No  Eyes Blurred vision?: No Double vision?: No  Ears/Nose/Throat Sore throat?: No Sinus problems?: No  Hematologic/Lymphatic Swollen glands?: No Easy bruising?: No  Cardiovascular Leg swelling?:  No Chest pain?: No  Respiratory Cough?: No Shortness of breath?: No  Endocrine Excessive thirst?: No  Musculoskeletal Back pain?: No Joint pain?: No  Neurological Headaches?: No Dizziness?: No  Psychologic Depression?: No Anxiety?: No  Physical Exam: BP 113/73   Pulse 65   Ht 5' 9" (1.753 m)   Wt 161 lb 8 oz (73.3 kg)   BMI 23.85 kg/m   Constitutional: Well nourished. Alert and oriented, No acute distress. HEENT: [place] AT, moist mucus membranes. Trachea midline, no masses. Cardiovascular: No clubbing, cyanosis, or edema. Respiratory: Normal respiratory effort, no increased work of breathing. GI: Abdomen is soft, non tender, non distended, no abdominal masses. GU: No CVA tenderness.  No bladder fullness or masses.   Skin: No rashes, bruises or suspicious lesions. Lymph: No cervical or inguinal adenopathy. Neurologic: Grossly intact, no focal deficits, moving all 4 extremities. Psychiatric: Normal mood and affect.  Laboratory Data: Lab Results  Component Value Date   WBC 4.6 11/24/2015   HGB 13.8 11/24/2015   HCT 40.8 11/24/2015   MCV 88.3 11/24/2015   PLT 249.0 11/24/2015    Lab Results  Component Value Date   CREATININE 0.75 11/24/2015    Lab Results  Component Value Date   TSH 0.62 11/24/2015       Component Value Date/Time   CHOL 168 11/24/2015 1007   HDL 85.40 11/24/2015 1007   CHOLHDL 2 11/24/2015 1007   VLDL 9.0 11/24/2015 1007   LDLCALC 74 11/24/2015 1007    Lab Results  Component Value Date   AST 19  11/24/2015   Lab Results  Component Value Date   ALT 16 11/24/2015   I have reviewed the labs  Pertinent Imaging: Results for BRINNLEY, LACAP (MRN [id number]) as of 09/18/2016 13:37  Ref. Range 09/18/2016 13:19  Scan Result Unknown 21   I have independently reviewed the films.    Assessment & Plan:    1. Recurrent UTI's  - criteria for recurrent UTI has not been met with 2 or more infections in 6 months or 3 or greater infections in one year - explained that we would need to culture her urine when she is having symptoms so that we can establish diagnosis of recurrent UTIs and to see if she is being infected with the same organism or different organism or the resistance pattern is increasing  - Patient is instructed to increase their water intake until the urine is pale yellow or clear (10 to 12 cups daily)   - probiotics (yogurt, oral pills or vaginal suppositories), take cranberry pills or drink the juice and D-mannose  - avoid soaking in tubs and wipe front to back after urinating   - advised them to have CATH UA's for urinalysis and culture to prevent skin contamination of the specimen  - reviewed symptoms of UTI and advised not to have urine checked or be treated for UTI if not experiencing symptoms  - discussed antibiotic stewardship with the patient    2. Vaginal atrophy  - she is on a estrogen cream three nights weekly  3. OAB/Urgency  - Myrbetriq 25 mg daily, #28 samples given,  have advised the patient of the side effects of Myrbetriq, such as: elevation in BP, urinary retention and/or HA.  - RTC in one month for OAB questionnaire and PVR  Return in about 1 month (around 10/19/2016) for PVR and OAB questionnaire.  These notes generated with voice recognition software. I apologize for typographical errors.  Zara Council, [facility] [address], [place], [place] [address] [phone]

## 2016-09-18 ENCOUNTER — Encounter: Payer: Self-pay | Admitting: Urology

## 2016-09-18 ENCOUNTER — Ambulatory Visit: Payer: Medicare Other | Admitting: Urology

## 2016-09-18 VITALS — BP 113/73 | HR 65 | Ht 69.0 in | Wt 161.5 lb

## 2016-09-18 DIAGNOSIS — N952 Postmenopausal atrophic vaginitis: Secondary | ICD-10-CM | POA: Diagnosis not present

## 2016-09-18 DIAGNOSIS — R3915 Urgency of urination: Secondary | ICD-10-CM | POA: Diagnosis not present

## 2016-09-18 DIAGNOSIS — N390 Urinary tract infection, site not specified: Secondary | ICD-10-CM | POA: Diagnosis not present

## 2016-09-18 LAB — BLADDER SCAN AMB NON-IMAGING: SCAN RESULT: 21

## 2016-10-03 ENCOUNTER — Encounter: Payer: Self-pay | Admitting: Urology

## 2016-10-03 MED ORDER — MIRABEGRON ER 25 MG PO TB24: 25.0000 mg | ORAL_TABLET | Freq: Every day | ORAL | 0 refills | Status: DC

## 2016-10-18 NOTE — Progress Notes (Signed)
10/21/2016 9:47 AM   Shannon [name] October 10, 1950 [id number]  Referring provider: Abner Greenspan, MD [address] [place], [place] [address]  Chief Complaint  Patient presents with  . Follow-up    1 mo  recurrent uti and vaginal atrophy    HPI: 66 y/o WF with a personal history of recurrent UTI's, vaginal atrophy and urgency who presents today for a three week follow up after a trial of Myrbetriq.    Background history Patient is a 29 -year-old Caucasian female who is referred to [name] by, Dr. Loura [name], for recurrent urinary tract infections.  Patient states that she has had 7 urinary tract infections over the last year.  3 episodes back to back in July.  Once daily Macrobid stopped on Monday.  Reviewing her records,  she has had one documented urinary tract infection over the last year.  Her symptoms with a urinary tract infection consist of frequency, urgency (she would like to try the Myrbetriq, but she is fearful her insurance will not cover the medication), dysuria, pain and discomfort in bladder.  She denies gross hematuria, back pain, abdominal pain or flank pain. She has not had any recent fevers, chills, nausea or vomiting.  She had a pessary placed in 03/2016.  Pessary is removed every night.  She underwent a sling procedure for SUI at the time of her hysterectomy.  She does not have a history of nephrolithiasis, GU surgery or GU trauma.   She is sexually active.  She has noted a correlation with her urinary tract infections and sexual intercourse.   She does not engage in anal sex.   She is postmenopausal.   She denies constipation and/or diarrhea.  She does engage in good perineal hygiene. She does not take tub baths.  She has/does not have incontinence.  She is using incontinence pads one or two a day.  She has not had any recent imaging studies.  She is drinking enough water to make her urine pale.  No sodas and no teas.  Occasional herb tea and almond mild.  Using a  vaginal estrogen cream since last November three days a week.    Today, she is experiencing urgency x 0-3, frequency x 8 or more, not restricting fluids to avoid visits to the restroom, not engaging in toilet mapping, incontinence x 4-7 and nocturia x 0-3.  She denies any gross hematuria, dysuria or suprapubic pain. She is not any fevers, chills, nausea or vomiting. Her PVR today is 0 mL.  Her blood pressure is 116/71.    She feels the Myrbetriq is very effective.     PMH: Past Medical History:  Diagnosis Date  . Blood transfusion without reported diagnosis   . Chronic neck and back pain    received physical therapy  . GERD (gastroesophageal reflux disease)    with esophagitis  . Numbness    Right outer thigh, constant, notices more with standing for a long period of time  . Plantar fasciitis    left    Surgical History: Past Surgical History:  Procedure Laterality Date  . BLADDER SUSPENSION N/A 07/04/2015   Procedure: TRANSVAGINAL TAPE (TVT) SLING                   ;  Surgeon: Emily Filbert, MD;  Location: [facility];  Service: Gynecology;  Laterality: N/A;  . COLONOSCOPY    . CYSTOCELE REPAIR N/A 07/04/2015   Procedure: ANTERIOR REPAIR (CYSTOCELE);  Surgeon:  Emily Filbert, MD;  Location: [facility];  Service: Gynecology;  Laterality: N/A;  . CYSTOSCOPY N/A 07/04/2015   Procedure: CYSTOSCOPY;  Surgeon: Emily Filbert, MD;  Location: [facility];  Service: Gynecology;  Laterality: N/A;  . DILATION AND CURETTAGE OF UTERUS    . POLYPECTOMY    . TUBAL LIGATION  1987  . UPPER GI ENDOSCOPY    . VAGINAL HYSTERECTOMY N/A 07/04/2015   Procedure: TOTAL VAGINAL HYSTERECTOMY  ;  Surgeon: Emily Filbert, MD;  Location: [facility];  Service: Gynecology;  Laterality: N/A;    Home Medications:  Allergies as of 10/21/2016      Reactions   Neomycin-bacitracin Zn-polymyx Rash   Ointment      Medication List       Accurate as of 10/21/16  9:47 AM. Always use your most recent med list.          D-MANNOSE PO Take by  mouth 2 (two) times daily.   docusate sodium 250 MG capsule Commonly known as:  COLACE Take 250 mg by mouth daily.   Estriol Powd INSERT 1 GRAM VAGINALLY THREE TIMES A WEEK AS DIRECTED.   ESTROGENS, CONJUGATED VA Place vaginally 3 (three) times a week.   famotidine 20 MG tablet Commonly known as:  PEPCID Take 20 mg by mouth daily as needed for heartburn or indigestion.   magnesium gluconate 500 MG tablet Commonly known as:  MAGONATE Take 1,000 mg by mouth daily.   mirabegron ER 25 MG Tb24 tablet Commonly known as:  MYRBETRIQ Take 1 tablet (25 mg total) by mouth daily.   multivitamin tablet Take 1 tablet by mouth daily.   nitrofurantoin (macrocrystal-monohydrate) 100 MG capsule Commonly known as:  MACROBID Take 1 capsule (100 mg total) by mouth 2 (two) times daily.   nitrofurantoin (macrocrystal-monohydrate) 100 MG capsule Commonly known as:  MACROBID Take 1 pill by mouth twice daily for 7 days and then once daily   PUMPKIN SEED OIL PO Take 2,000 mg by mouth 2 (two) times daily.   [place] PRO-B PO Take by mouth daily.   St Johns Wort 300 MG Tabs Take 2 tablets by mouth 2 (two) times daily.   triamcinolone cream 0.5 % Commonly known as:  KENALOG Apply 1 application topically 2 (two) times daily.            Discharge Care Instructions        Start     Ordered   10/21/16 0000  BLADDER SCAN AMB NON-IMAGING     10/21/16 0907   10/21/16 0000  mirabegron ER (MYRBETRIQ) 25 MG TB24 tablet  Daily    Question:  Supervising Provider  Answer:  Hollice Espy   10/21/16 0946      Allergies:  Allergies  Allergen Reactions  . Neomycin-Bacitracin Zn-Polymyx Rash    Ointment    Family History: Family History  Problem Relation Age of Onset  . Stroke Mother        x 2  . Heart disease Mother        congenital arrhythmia and CHF  . Depression Mother   . Prostate cancer Brother   . Stomach cancer Paternal Uncle   . Colon cancer Neg Hx   . Esophageal  cancer Neg Hx   . Kidney cancer Neg Hx   . Bladder Cancer Neg Hx     Social History:  reports that she has never smoked. She has never used smokeless tobacco. She reports that she does not drink alcohol  or use drugs.  ROS: UROLOGY Frequent Urination?: Yes Hard to postpone urination?: Yes Burning/pain with urination?: No Get up at night to urinate?: Yes Leakage of urine?: Yes Urine stream starts and stops?: No Trouble starting stream?: No Do you have to strain to urinate?: No Blood in urine?: No Urinary tract infection?: No Sexually transmitted disease?: No Injury to kidneys or bladder?: No Painful intercourse?: No Weak stream?: No Currently pregnant?: No Vaginal bleeding?: No Last menstrual period?: n  Gastrointestinal Nausea?: No Vomiting?: No Indigestion/heartburn?: No Diarrhea?: No Constipation?: No  Constitutional Fever: No Night sweats?: No Weight loss?: No Fatigue?: No  Skin Skin rash/lesions?: No Itching?: No  Eyes Blurred vision?: No Double vision?: No  Ears/Nose/Throat Sore throat?: No Sinus problems?: No  Hematologic/Lymphatic Swollen glands?: No Easy bruising?: No  Cardiovascular Leg swelling?: No Chest pain?: No  Respiratory Cough?: No Shortness of breath?: No  Endocrine Excessive thirst?: No  Musculoskeletal Back pain?: No Joint pain?: No  Neurological Headaches?: No Dizziness?: No  Psychologic Depression?: No Anxiety?: No  Physical Exam: BP 116/71   Pulse 72   Ht 5\' 8"  (1.727 m)   Wt 159 lb 6.4 oz (72.3 kg)   BMI 24.24 kg/m   Constitutional: Well nourished. Alert and oriented, No acute distress. HEENT: [place] AT, moist mucus membranes. Trachea midline, no masses. Cardiovascular: No clubbing, cyanosis, or edema. Respiratory: Normal respiratory effort, no increased work of breathing. GI: Abdomen is soft, non tender, non distended, no abdominal masses. GU: No CVA tenderness.  No bladder fullness or masses.   Skin: No  rashes, bruises or suspicious lesions. Lymph: No cervical or inguinal adenopathy. Neurologic: Grossly intact, no focal deficits, moving all 4 extremities. Psychiatric: Normal mood and affect.  Laboratory Data: Lab Results  Component Value Date   WBC 4.6 11/24/2015   HGB 13.8 11/24/2015   HCT 40.8 11/24/2015   MCV 88.3 11/24/2015   PLT 249.0 11/24/2015    Lab Results  Component Value Date   CREATININE 0.75 11/24/2015    Lab Results  Component Value Date   TSH 0.62 11/24/2015       Component Value Date/Time   CHOL 168 11/24/2015 1007   HDL 85.40 11/24/2015 1007   CHOLHDL 2 11/24/2015 1007   VLDL 9.0 11/24/2015 1007   LDLCALC 74 11/24/2015 1007    Lab Results  Component Value Date   AST 19 11/24/2015   Lab Results  Component Value Date   ALT 16 11/24/2015   I have reviewed the labs  Pertinent Imaging: Results for NADEAN, MONTANARO (MRN [id number]) as of 10/21/2016 09:42  Ref. Range 10/21/2016 09:15  Scan Result Unknown 0    I have independently reviewed the films.    Assessment & Plan:    1. Recurrent UTI's  - Reviewed UTI prevention strategies  - advised them to have CATH UA's for urinalysis and culture to prevent skin contamination of the specimen  - reviewed symptoms of UTI and advised not to have urine checked or be treated for UTI if not experiencing symptoms   2. Vaginal atrophy  - she is on a estrogen cream three nights weekly  - RTC for exam  3. OAB/Urgency  - continue the Myrbetriq 25 mg daily; script given   - RTC in one year for OAB questionnaire and PVR  Return in about 1 year (around 10/21/2017) for OAB questionnaire, PVR and exam.  These notes generated with voice recognition software. I apologize for typographical errors.  Zara Council, [facility] [address] [place], [place] [address] [phone]

## 2016-10-21 ENCOUNTER — Ambulatory Visit (INDEPENDENT_AMBULATORY_CARE_PROVIDER_SITE_OTHER): Payer: Medicare Other | Admitting: Urology

## 2016-10-21 ENCOUNTER — Encounter: Payer: Self-pay | Admitting: Urology

## 2016-10-21 VITALS — BP 116/71 | HR 72 | Ht 68.0 in | Wt 159.4 lb

## 2016-10-21 DIAGNOSIS — Z87440 Personal history of urinary (tract) infections: Secondary | ICD-10-CM

## 2016-10-21 DIAGNOSIS — N952 Postmenopausal atrophic vaginitis: Secondary | ICD-10-CM

## 2016-10-21 DIAGNOSIS — R3915 Urgency of urination: Secondary | ICD-10-CM | POA: Diagnosis not present

## 2016-10-21 LAB — BLADDER SCAN AMB NON-IMAGING: Scan Result: 0

## 2016-10-21 MED ORDER — MIRABEGRON ER 25 MG PO TB24: 25.0000 mg | ORAL_TABLET | Freq: Every day | ORAL | 3 refills | Status: DC

## 2016-11-11 ENCOUNTER — Other Ambulatory Visit: Payer: Self-pay | Admitting: Family Medicine

## 2016-11-11 DIAGNOSIS — Z1231 Encounter for screening mammogram for malignant neoplasm of breast: Secondary | ICD-10-CM

## 2016-11-25 ENCOUNTER — Ambulatory Visit: Payer: Medicare Other

## 2016-12-06 ENCOUNTER — Encounter: Payer: Medicare Other | Admitting: Family Medicine

## 2016-12-08 ENCOUNTER — Other Ambulatory Visit: Payer: Self-pay

## 2016-12-08 ENCOUNTER — Emergency Department: Payer: Medicare Other

## 2016-12-08 ENCOUNTER — Emergency Department
Admission: EM | Admit: 2016-12-08 | Discharge: 2016-12-08 | Disposition: A | Discharge status: Home / Self Care | Payer: Medicare Other | Attending: Emergency Medicine | Admitting: Emergency Medicine

## 2016-12-08 ENCOUNTER — Encounter: Payer: Self-pay | Admitting: Emergency Medicine

## 2016-12-08 DIAGNOSIS — K529 Noninfective gastroenteritis and colitis, unspecified: Secondary | ICD-10-CM | POA: Insufficient documentation

## 2016-12-08 DIAGNOSIS — R109 Unspecified abdominal pain: Secondary | ICD-10-CM | POA: Diagnosis present

## 2016-12-08 DIAGNOSIS — Z79899 Other long term (current) drug therapy: Secondary | ICD-10-CM | POA: Diagnosis not present

## 2016-12-08 LAB — URINALYSIS, COMPLETE (UACMP) WITH MICROSCOPIC
Bacteria, UA: NONE SEEN
Bilirubin Urine: NEGATIVE
GLUCOSE, UA: NEGATIVE mg/dL
Hgb urine dipstick: NEGATIVE
KETONES UR: 20 mg/dL — AB
Leukocytes, UA: NEGATIVE
Nitrite: NEGATIVE
PH: 7 (ref 5.0–8.0)
Protein, ur: NEGATIVE mg/dL
SPECIFIC GRAVITY, URINE: 1.02 (ref 1.005–1.030)

## 2016-12-08 LAB — COMPREHENSIVE METABOLIC PANEL
ALK PHOS: 72 U/L (ref 38–126)
ALT: 21 U/L (ref 14–54)
AST: 25 U/L (ref 15–41)
Albumin: 4 g/dL (ref 3.5–5.0)
Anion gap: 9 (ref 5–15)
BILIRUBIN TOTAL: 1.3 mg/dL — AB (ref 0.3–1.2)
BUN: 26 mg/dL — ABNORMAL HIGH (ref 6–20)
CALCIUM: 9.2 mg/dL (ref 8.9–10.3)
CO2: 27 mmol/L (ref 22–32)
CREATININE: 0.74 mg/dL (ref 0.44–1.00)
Chloride: 102 mmol/L (ref 101–111)
GFR calc non Af Amer: >60 mL/min (ref >60)
GLUCOSE: 166 mg/dL — AB (ref 65–99)
Potassium: 4 mmol/L (ref 3.5–5.1)
SODIUM: 138 mmol/L (ref 135–145)
TOTAL PROTEIN: 6.8 g/dL (ref 6.5–8.1)

## 2016-12-08 LAB — CBC
HCT: 40 % (ref 35.0–47.0)
Hemoglobin: 13.4 g/dL (ref 12.0–16.0)
MCH: 30.2 pg (ref 26.0–34.0)
MCHC: 33.6 g/dL (ref 32.0–36.0)
MCV: 89.7 fL (ref 80.0–100.0)
PLATELETS: 255 10*3/uL (ref 150–440)
RBC: 4.45 MIL/uL (ref 3.80–5.20)
RDW: 13.2 % (ref 11.5–14.5)
WBC: 5.6 10*3/uL (ref 3.6–11.0)

## 2016-12-08 LAB — LIPASE, BLOOD: Lipase: 28 U/L (ref 11–51)

## 2016-12-08 MED ORDER — IOPAMIDOL (ISOVUE-300) INJECTION 61%
30.0000 mL | Freq: Once | INTRAVENOUS | Status: AC | PRN
  Administered 2016-12-08: 30 mL via ORAL

## 2016-12-08 MED ORDER — IOPAMIDOL (ISOVUE-300) INJECTION 61%
100.0000 mL | Freq: Once | INTRAVENOUS | Status: AC | PRN
  Administered 2016-12-08: 100 mL via INTRAVENOUS

## 2016-12-08 MED ORDER — DICYCLOMINE HCL 20 MG PO TABS: 20.0000 mg | ORAL_TABLET | Freq: Three times a day (TID) | ORAL | 0 refills | Status: DC | PRN

## 2016-12-08 MED ORDER — METRONIDAZOLE 500 MG PO TABS: 500.0000 mg | ORAL_TABLET | Freq: Three times a day (TID) | ORAL | 0 refills | Status: AC

## 2016-12-08 MED ORDER — CIPROFLOXACIN HCL 500 MG PO TABS: 500.0000 mg | ORAL_TABLET | Freq: Two times a day (BID) | ORAL | 0 refills | Status: AC

## 2016-12-08 MED ORDER — ONDANSETRON HCL 4 MG/2ML IJ SOLN
4.0000 mg | Freq: Once | INTRAMUSCULAR | Status: AC
  Administered 2016-12-08: 4 mg via INTRAVENOUS
  Filled 2016-12-08: qty 2

## 2016-12-08 MED ORDER — MORPHINE SULFATE (PF) 2 MG/ML IV SOLN
2.0000 mg | Freq: Once | INTRAVENOUS | Status: AC
  Administered 2016-12-08: 2 mg via INTRAVENOUS
  Filled 2016-12-08: qty 1

## 2016-12-08 MED ORDER — SODIUM CHLORIDE 0.9 % IV BOLUS (SEPSIS)
1000.0000 mL | Freq: Once | INTRAVENOUS | Status: AC
  Administered 2016-12-08: 1000 mL via INTRAVENOUS

## 2016-12-08 MED ORDER — METOCLOPRAMIDE HCL 5 MG/ML IJ SOLN
10.0000 mg | Freq: Once | INTRAMUSCULAR | Status: AC
  Administered 2016-12-08: 10 mg via INTRAVENOUS
  Filled 2016-12-08: qty 2

## 2016-12-08 MED ORDER — ONDANSETRON HCL 4 MG PO TABS: 4.0000 mg | ORAL_TABLET | Freq: Three times a day (TID) | ORAL | 0 refills | Status: DC | PRN

## 2016-12-08 NOTE — ED Provider Notes (Signed)
[facility] Emergency Department Provider Note  ____________________________________________   I have reviewed the triage vital signs and the nursing notes.   HISTORY  Chief Complaint Abdominal Pain   History limited by: Not Limited   HPI Shannon [name] is a 66 y.o. female who presents to the emergency department today because of abdominal pain.  LOCATION:primarily left sided DURATION:2 days TIMING: intermittent SEVERITY: severe QUALITY: cramping CONTEXT: patient denies any recent out of country travel. No unusual ingestion. MODIFYING FACTORS: slightly worse after eating ASSOCIATED SYMPTOMS: positive for nausea and vomiting. Denies constipation. Denies bloody stool.  Per medical record review patient has a history of colonoscopy earlier this year with left sided diverticulum found.   Past Medical History:  Diagnosis Date  . Blood transfusion without reported diagnosis   . Chronic neck and back pain    received physical therapy  . GERD (gastroesophageal reflux disease)    with esophagitis  . Numbness    Right outer thigh, constant, notices more with standing for a long period of time  . Plantar fasciitis    left    Patient Active Problem List   Diagnosis Date Noted  . UTI (urinary tract infection) 08/23/2016  . Recurrent UTI 08/23/2016  . Chronic paronychia of finger 07/26/2016  . Infection of nail bed of finger, right 07/18/2016  . Cystitis 12/06/2015  . Estrogen deficiency 12/06/2015  . Elevated blood sugar 12/06/2015  . Post-operative state 07/04/2015  . Third degree uterine prolapse 04/12/2015  . Incontinence of urine in female 04/12/2015  . Pelvic relaxation due to uterine prolapse 03/27/2015  . Right shoulder pain 12/02/2014  . Neck pain on right side 12/02/2014  . Thoracic back pain 12/02/2014  . Medicare annual wellness visit, initial 11/30/2014  . Routine general medical examination at a health care facility 11/05/2012   . Other screening mammogram 11/13/2011  . PLANTAR FASCIITIS, LEFT 07/28/2009  . PERSONAL HX COLONIC POLYPS 11/02/2007  . ESOPHAGITIS, MILD 03/31/2007  . GERD 10/20/2006    Past Surgical History:  Procedure Laterality Date  . ANTERIOR REPAIR (CYSTOCELE) N/A 07/04/2015   Performed by Emily Filbert, MD at [facility] ORS  . COLONOSCOPY    . CYSTOSCOPY N/A 07/04/2015   Performed by Emily Filbert, MD at [facility] ORS  . DILATION AND CURETTAGE OF UTERUS    . POLYPECTOMY    . TOTAL VAGINAL HYSTERECTOMY N/A 07/04/2015   Performed by Emily Filbert, MD at [facility] ORS  . TRANSVAGINAL TAPE (TVT) SLING N/A 07/04/2015   Performed by Emily Filbert, MD at [facility] ORS  . TUBAL LIGATION  1987  . UPPER GI ENDOSCOPY      Prior to Admission medications   Medication Sig Start Date End Date Taking? Authorizing Provider  D-MANNOSE PO Take by mouth 2 (two) times daily.    [provider]  docusate sodium (COLACE) 250 MG capsule Take 250 mg by mouth daily.    [provider]  Estriol POWD INSERT 1 GRAM VAGINALLY THREE TIMES A WEEK AS DIRECTED. Patient not taking: Reported on 10/21/2016 09/03/16   Emily Filbert, MD  ESTROGENS, CONJUGATED VA Place vaginally 3 (three) times a week.    [provider]  famotidine (PEPCID) 20 MG tablet Take 20 mg by mouth daily as needed for heartburn or indigestion.    [provider]  Lactobacillus ([place] PRO-B PO) Take by mouth daily.    [provider]  magnesium gluconate (MAGONATE) 500 MG tablet Take 1,000  mg by mouth daily.    [provider]  mirabegron ER (MYRBETRIQ) 25 MG TB24 tablet Take 1 tablet (25 mg total) by mouth daily. 10/21/16   McGowan, Hunt Oris, PA-C  Misc Natural Products (PUMPKIN SEED OIL PO) Take 2,000 mg by mouth 2 (two) times daily.    [provider]  Multiple Vitamin (MULTIVITAMIN) tablet Take 1 tablet by mouth daily.      [provider]  nitrofurantoin, macrocrystal-monohydrate, (MACROBID) 100 MG capsule  Take 1 capsule (100 mg total) by mouth 2 (two) times daily. Patient not taking: Reported on 09/18/2016 08/18/16   Abner Greenspan, MD  nitrofurantoin, macrocrystal-monohydrate, (MACROBID) 100 MG capsule Take 1 pill by mouth twice daily for 7 days and then once daily Patient not taking: Reported on 09/18/2016 08/23/16   Tower, Wynelle Fanny, MD  [facility] Wort 300 MG TABS Take 2 tablets by mouth 2 (two) times daily.    [provider]  triamcinolone cream (KENALOG) 0.5 % Apply 1 application topically 2 (two) times daily. 07/26/16   Jinny Sanders, MD    Allergies Neomycin-bacitracin zn-polymyx  Family History  Problem Relation Age of Onset  . Stroke Mother        x 2  . Heart disease Mother        congenital arrhythmia and CHF  . Depression Mother   . Prostate cancer Brother   . Stomach cancer Paternal Uncle   . Colon cancer Neg Hx   . Esophageal cancer Neg Hx   . Kidney cancer Neg Hx   . Bladder Cancer Neg Hx     Social History Social History   Tobacco Use  . Smoking status: Never Smoker  . Smokeless tobacco: Never Used  Substance Use Topics  . Alcohol use: No    Alcohol/week: 0.0 oz    Comment: rare  . Drug use: No    Review of Systems Constitutional: No fever/chills Eyes: No visual changes. ENT: No sore throat. Cardiovascular: Denies chest pain. Respiratory: Denies shortness of breath. Gastrointestinal: Positive for abdominal pain, nausea and vomiting. Genitourinary: Negative for dysuria. Musculoskeletal: Negative for back pain. Skin: Negative for rash. Neurological: Negative for headaches, focal weakness or numbness.  ____________________________________________   PHYSICAL EXAM:  VITAL SIGNS: ED Triage Vitals  Enc Vitals Group     BP 12/08/16 0915 127/64     Pulse Rate 12/08/16 0915 (!) 55     Resp 12/08/16 0915 16     Temp 12/08/16 0915 98.1 F (36.7 C)     Temp Source 12/08/16 0915 Oral     SpO2 12/08/16 0915 100 %     Weight 12/08/16 0915 156 lb (70.8  kg)     Height 12/08/16 0915 5\' 8"  (1.727 m)     Head Circumference --      Peak Flow --      Pain Score 12/08/16 0914 8   Constitutional: Alert and oriented. Well appearing and in no distress. Eyes: Conjunctivae are normal.  ENT   Head: Normocephalic and atraumatic.   Nose: No congestion/rhinnorhea.   Mouth/Throat: Mucous membranes are moist.   Neck: No stridor. Hematological/Lymphatic/Immunilogical: No cervical lymphadenopathy. Cardiovascular: Normal rate, regular rhythm.  No murmurs, rubs, or gallops.  Respiratory: Normal respiratory effort without tachypnea nor retractions. Breath sounds are clear and equal bilaterally. No wheezes/rales/rhonchi. Gastrointestinal: Soft. Tender to palpation in the left side. No rebound. No guarding.  Genitourinary: Deferred Musculoskeletal: Normal range of motion in all extremities. No lower extremity edema.  Neurologic:  Normal speech and language. No gross focal neurologic deficits are appreciated.  Skin:  Skin is warm, dry and intact. No rash noted. Psychiatric: Mood and affect are normal. Speech and behavior are normal. Patient exhibits appropriate insight and judgment.  ____________________________________________    LABS (pertinent positives/negatives)  Lipase 28 CBC wnl CMP glu 166, cr 0.74  ____________________________________________   EKG  None  ____________________________________________    RADIOLOGY  CT abd/pel Possible colitis. Small amount of free fluid.   ____________________________________________   PROCEDURES  Procedures  ____________________________________________   INITIAL IMPRESSION / ASSESSMENT AND PLAN / ED COURSE  Pertinent labs & imaging results that were available during my care of the patient were reviewed by me and considered in my medical decision making (see chart for details).  Differential diagnosis includes, but is not limited to, ovarian cyst, ovarian torsion, acute  appendicitis, diverticulitis, urinary tract infection/pyelonephritis, bowel obstruction, colitis, renal colic, gastroenteritis, hernia, amongst other etiologies.  CT scan shows possible colitis with small amount of free fluid. Feel fluid most likely reactive. No WBC or fever to suggest significant infection. Given diverticula will plan on treating with antibiotics. Did discuss return precautions with patient and family.   ____________________________________________   FINAL CLINICAL IMPRESSION(S) / ED DIAGNOSES  Final diagnoses:  Colitis  Abdominal pain, unspecified abdominal location     Note: This dictation was prepared with Dragon dictation. Any transcriptional errors that result from this process are unintentional     Nance Pear, MD 12/08/16 1419

## 2016-12-08 NOTE — ED Notes (Signed)
Patient states that she needs to lie down.  Assisted to subwait and placed in a recliner with warm blankets.  Patient has her urinalysis cup - states she was unable to void.

## 2016-12-08 NOTE — Discharge Instructions (Signed)
Please seek medical attention for any high fevers, chest pain, shortness of breath, change in behavior, persistent vomiting, bloody stool or any other new or concerning symptoms.  

## 2016-12-08 NOTE — ED Notes (Signed)
Pt ambulatory to wheelchair upon discharge. Pt and husband verbalized understanding of discharge instructions, prescriptions, importance of taking ALL antibiotics and follow-up care. Pt VSS, A&O x4, skin warm and dry.

## 2016-12-08 NOTE — ED Notes (Signed)
Pt finished first bottle of contrast and vomited it all up. Per Dr. Archie Balboa, [place] to send to CT scan regardless of oral contrast. Kat in radiology notified.

## 2016-12-08 NOTE — ED Triage Notes (Signed)
Abdominal pain.  Onset of symptoms Friday.  States started vomiting this morning.

## 2016-12-08 NOTE — ED Notes (Addendum)
ED Provider at bedside. 

## 2016-12-08 NOTE — ED Notes (Signed)
Paulette RN aware of placement in Room 17.

## 2016-12-08 NOTE — ED Notes (Signed)
Pt reminded to keep her arm straight so fluids can flow easier. Delay in bolus d/t bending of arm.

## 2016-12-08 NOTE — ED Notes (Addendum)
Pt states sudden cramping pain since Friday after eating breakfast. Since then, she has experienced constant generalized abdominal pain that is worse on the left side than the right with increased nausea. Has vomited six times since she woke up this morning. Pt denies diarrhea, fever and chills. Pt reports "feeling very cold." Pt was tender to the pressure of this RN's stethoscope on her abdomen while auscultating.

## 2016-12-13 ENCOUNTER — Ambulatory Visit: Payer: Medicare Other | Admitting: Family Medicine

## 2016-12-13 ENCOUNTER — Encounter: Payer: Self-pay | Admitting: Family Medicine

## 2016-12-13 VITALS — BP 122/70 | HR 69 | Temp 98.5°F | Ht 68.0 in | Wt 160.8 lb

## 2016-12-13 DIAGNOSIS — K529 Noninfective gastroenteritis and colitis, unspecified: Secondary | ICD-10-CM

## 2016-12-13 NOTE — Progress Notes (Signed)
Pre visit review using our clinic review tool, if applicable. No additional management support is needed unless otherwise documented below in the visit note. 

## 2016-12-13 NOTE — Patient Instructions (Signed)
Go to ER if you start having fevers, have worsening pain, or cannot eat or drink anything.  Start taking the Bentyl to help with the crampy type pain.  Stay well hydrated.  Bring up issue at your appt if you are not improving by Monday.

## 2016-12-13 NOTE — Progress Notes (Signed)
Chief Complaint  Patient presents with  . Follow-up    ED on sunday. Stillhaving pain    Subjective: Patient is a 66 y.o. female here for ED f/u. Pt dx'd w colitis in ED on 11/18 and rx'd Cipro and Flagyl.  Feels a little better, but still having intermittent and crampy pains. She was constipated for a little while, but this resolved when she took a stool softener. She is no longer having nausea or vomiting. Denies bleeding, urinary complaints or fevers. She is able to tolerate PO diet.   ROS: GI: As noted in HPI Const: No fevers  Past Medical History:  Diagnosis Date  . Blood transfusion without reported diagnosis   . Chronic neck and back pain    received physical therapy  . GERD (gastroesophageal reflux disease)    with esophagitis  . Numbness    Right outer thigh, constant, notices more with standing for a long period of time  . Plantar fasciitis    left    Objective: BP 122/70   Pulse 69   Temp 98.5 F (36.9 C) (Oral)   Ht 5\' 8"  (1.727 m)   Wt 160 lb 12.8 oz (72.9 kg)   SpO2 99%   BMI 24.45 kg/m  General: Awake, appears stated age HEENT: MMM, EOMi Heart: RRR, no murmurs Lungs: CTAB, no rales, wheezes or rhonchi. No accessory muscle use Abd: BS+, soft, TTP diffusely, worse in LLQ, ND, no masses or organomegaly Psych: Age appropriate judgment and insight, normal affect and mood  Assessment and Plan: Colitis  Cont Flagyl and Cipro. Start using Bentyl more routinely for symptomatic management.  If starting to have fevers, worsening pain, or inability to tolerate PO intake, go to ER. Expect things to improve further over weekend. F/u with PCP's office Mon if not getting better.  The patient voiced understanding and agreement to the plan.  [place], DO 12/13/16  12:04 PM

## 2016-12-15 ENCOUNTER — Telehealth: Payer: Self-pay | Admitting: Family Medicine

## 2016-12-15 DIAGNOSIS — Z0000 Encounter for general adult medical examination without abnormal findings: Secondary | ICD-10-CM

## 2016-12-15 NOTE — Telephone Encounter (Signed)
-----   Message from Eustace Pen, LPN sent at [phone]/6008  4:52 PM EST ----- Regarding: Labs 11/26 Lab orders needed. Thank you.  Insurance:  [employer]

## 2016-12-16 ENCOUNTER — Ambulatory Visit (INDEPENDENT_AMBULATORY_CARE_PROVIDER_SITE_OTHER): Payer: Medicare Other

## 2016-12-16 VITALS — BP 102/70 | HR 72 | Temp 98.3°F | Ht 68.25 in | Wt 157.5 lb

## 2016-12-16 DIAGNOSIS — Z0000 Encounter for general adult medical examination without abnormal findings: Secondary | ICD-10-CM

## 2016-12-16 LAB — COMPREHENSIVE METABOLIC PANEL
ALBUMIN: 3.8 g/dL (ref 3.5–5.2)
ALK PHOS: 47 U/L (ref 39–117)
ALT: 17 U/L (ref 0–35)
AST: 21 U/L (ref 0–37)
BUN: 27 mg/dL — AB (ref 6–23)
CHLORIDE: 102 meq/L (ref 96–112)
CO2: 31 mEq/L (ref 19–32)
CREATININE: 0.82 mg/dL (ref 0.40–1.20)
Calcium: 9.2 mg/dL (ref 8.4–10.5)
GFR: 73.6 mL/min (ref >60.00)
Glucose, Bld: 108 mg/dL — ABNORMAL HIGH (ref 70–99)
Potassium: 4.4 mEq/L (ref 3.5–5.1)
SODIUM: 139 meq/L (ref 135–145)
TOTAL PROTEIN: 6.6 g/dL (ref 6.0–8.3)
Total Bilirubin: 0.3 mg/dL (ref 0.2–1.2)

## 2016-12-16 LAB — CBC WITH DIFFERENTIAL/PLATELET
BASOS PCT: 0.8 % (ref 0.0–3.0)
Basophils Absolute: 0 10*3/uL (ref 0.0–0.1)
EOS ABS: 0.1 10*3/uL (ref 0.0–0.7)
Eosinophils Relative: 1.9 % (ref 0.0–5.0)
HCT: 40.8 % (ref 36.0–46.0)
HEMOGLOBIN: 13.4 g/dL (ref 12.0–15.0)
Lymphocytes Relative: 31.8 % (ref 12.0–46.0)
Lymphs Abs: 1.6 10*3/uL (ref 0.7–4.0)
MCHC: 32.7 g/dL (ref 30.0–36.0)
MCV: 92.4 fl (ref 78.0–100.0)
MONO ABS: 0.6 10*3/uL (ref 0.1–1.0)
Monocytes Relative: 11.5 % (ref 3.0–12.0)
NEUTROS PCT: 54 % (ref 43.0–77.0)
Neutro Abs: 2.6 10*3/uL (ref 1.4–7.7)
Platelets: 258 10*3/uL (ref 150.0–400.0)
RBC: 4.42 Mil/uL (ref 3.87–5.11)
RDW: 13.7 % (ref 11.5–15.5)
WBC: 4.9 10*3/uL (ref 4.0–10.5)

## 2016-12-16 LAB — LIPID PANEL
CHOLESTEROL: 147 mg/dL (ref 0–200)
HDL: 68.6 mg/dL (ref >39.00)
LDL Cholesterol: 68 mg/dL (ref 0–99)
NonHDL: 78.46
Total CHOL/HDL Ratio: 2
Triglycerides: 53 mg/dL (ref 0.0–149.0)
VLDL: 10.6 mg/dL (ref 0.0–40.0)

## 2016-12-16 LAB — TSH: TSH: 0.7 u[IU]/mL (ref 0.35–4.50)

## 2016-12-16 NOTE — Progress Notes (Signed)
   Subjective:    Patient ID: Shannon [name], female    DOB: 1950/09/15, 66 y.o.   MRN: [id number]  HPI I reviewed health advisor's note, was available for consultation, and agree with documentation and plan.    Review of Systems     Objective:   Physical Exam        Assessment & Plan:

## 2016-12-16 NOTE — Progress Notes (Signed)
Pre visit review using our clinic review tool, if applicable. No additional management support is needed unless otherwise documented below in the visit note. 

## 2016-12-16 NOTE — Progress Notes (Signed)
PCP notes:   Health maintenance:  Mammogram - addressed; future appt scheduled  Abnormal screenings:   None  Patient concerns:   Patient reports recently being treated for colitis.   Nurse concerns:  None  Next PCP appt:   12/25/16 @ 1200

## 2016-12-16 NOTE — Progress Notes (Signed)
Subjective:   Shannon [name] is a 66 y.o. female who presents for Medicare Annual (Subsequent) preventive examination.  Review of Systems:  N/A Cardiac Risk Factors include: advanced age (>53men, >31 women)     Objective:     Vitals: BP 102/70 (BP Location: Right Arm, Patient Position: Sitting, Cuff Size: Normal)   Pulse 72   Temp 98.3 F (36.8 C) (Oral)   Ht 5' 8.25" (1.734 m) Comment: no shoes  Wt 157 lb 8 oz (71.4 kg)   SpO2 99%   BMI 23.77 kg/m   Body mass index is 23.77 kg/m.   Tobacco Social History   Tobacco Use  Smoking Status Never Smoker  Smokeless Tobacco Never Used     Counseling given: No   Past Medical History:  Diagnosis Date  . Blood transfusion without reported diagnosis   . Chronic neck and back pain    received physical therapy  . GERD (gastroesophageal reflux disease)    with esophagitis  . Numbness    Right outer thigh, constant, notices more with standing for a long period of time  . Plantar fasciitis    left   Past Surgical History:  Procedure Laterality Date  . BLADDER SUSPENSION N/A 07/04/2015   Procedure: TRANSVAGINAL TAPE (TVT) SLING                   ;  Surgeon: Emily Filbert, MD;  Location: [facility];  Service: Gynecology;  Laterality: N/A;  . COLONOSCOPY    . CYSTOCELE REPAIR N/A 07/04/2015   Procedure: ANTERIOR REPAIR (CYSTOCELE);  Surgeon: Emily Filbert, MD;  Location: [facility];  Service: Gynecology;  Laterality: N/A;  . CYSTOSCOPY N/A 07/04/2015   Procedure: CYSTOSCOPY;  Surgeon: Emily Filbert, MD;  Location: [facility];  Service: Gynecology;  Laterality: N/A;  . DILATION AND CURETTAGE OF UTERUS    . POLYPECTOMY    . TUBAL LIGATION  1987  . UPPER GI ENDOSCOPY    . VAGINAL HYSTERECTOMY N/A 07/04/2015   Procedure: TOTAL VAGINAL HYSTERECTOMY  ;  Surgeon: Emily Filbert, MD;  Location: [facility];  Service: Gynecology;  Laterality: N/A;   Family History  Problem Relation Age of Onset  . Stroke Mother        x 2  . Heart disease Mother    congenital arrhythmia and CHF  . Depression Mother   . Prostate cancer Brother   . Stomach cancer Paternal Uncle   . Colon cancer Neg Hx   . Esophageal cancer Neg Hx   . Kidney cancer Neg Hx   . Bladder Cancer Neg Hx    Social History   Substance and Sexual Activity  Sexual Activity Yes  . Birth control/protection: Post-menopausal, Surgical    Outpatient Encounter Medications as of 12/16/2016  Medication Sig  . ciprofloxacin (CIPRO) 500 MG tablet Take 1 tablet (500 mg total) 2 (two) times daily for 10 days by mouth.  . D-MANNOSE PO Take by mouth 2 (two) times daily.  Marland Kitchen docusate sodium (COLACE) 250 MG capsule Take 250 mg by mouth daily.  Marland Kitchen ESTROGENS, CONJUGATED VA Place vaginally 3 (three) times a week.  . famotidine (PEPCID) 20 MG tablet Take 20 mg by mouth daily as needed for heartburn or indigestion.  . Lactobacillus ([place] PRO-B PO) Take by mouth daily.  . metroNIDAZOLE (FLAGYL) 500 MG tablet Take 1 tablet (500 mg total) 3 (three) times daily for 10 days by mouth.  . mirabegron ER (MYRBETRIQ) 25 MG  TB24 tablet Take 1 tablet (25 mg total) by mouth daily.  . Multiple Vitamin (MULTIVITAMIN) tablet Take 1 tablet by mouth daily.    Francella Solian [place] Wort 300 MG TABS Take 2 tablets by mouth 2 (two) times daily.  Marland Kitchen triamcinolone cream (KENALOG) 0.5 % Apply 1 application topically 2 (two) times daily.  . [DISCONTINUED] dicyclomine (BENTYL) 20 MG tablet Take 1 tablet (20 mg total) 3 (three) times daily as needed by mouth (abdominal pain).  . [DISCONTINUED] Estriol POWD INSERT 1 GRAM VAGINALLY THREE TIMES A WEEK AS DIRECTED.  . [DISCONTINUED] magnesium gluconate (MAGONATE) 500 MG tablet Take 1,000 mg by mouth daily.  . [DISCONTINUED] Misc Natural Products (PUMPKIN SEED OIL PO) Take 2,000 mg by mouth 2 (two) times daily.  . [DISCONTINUED] nitrofurantoin, macrocrystal-monohydrate, (MACROBID) 100 MG capsule Take 1 capsule (100 mg total) by mouth 2 (two) times daily.  . [DISCONTINUED]  nitrofurantoin, macrocrystal-monohydrate, (MACROBID) 100 MG capsule Take 1 pill by mouth twice daily for 7 days and then once daily  . [DISCONTINUED] ondansetron (ZOFRAN) 4 MG tablet Take 1 tablet (4 mg total) every 8 (eight) hours as needed by mouth for nausea or vomiting.   Facility-Administered Encounter Medications as of 12/16/2016  Medication  . 0.9 %  sodium chloride infusion    Activities of Daily Living In your present state of health, do you have any difficulty performing the following activities: 12/16/2016  Hearing? N  Vision? N  Difficulty concentrating or making decisions? N  Walking or climbing stairs? N  Dressing or bathing? N  Doing errands, shopping? N  Preparing Food and eating ? N  Using the Toilet? N  In the past six months, have you accidently leaked urine? N  Do you have problems with loss of bowel control? N  Managing your Medications? N  Managing your Finances? N  Housekeeping or managing your Housekeeping? N  Some recent data might be hidden    Patient Care Team: Tower, Wynelle Fanny, MD as PCP - General Emily Filbert, MD as Consulting Physician (Obstetrics and Gynecology)    Assessment:     Hearing Screening   125Hz  250Hz  500Hz  1000Hz  2000Hz  3000Hz  4000Hz  6000Hz  8000Hz   Right ear:   40 40 40  40    Left ear:   40 40 40  40      Visual Acuity Screening   Right eye Left eye Both eyes  Without correction:     With correction: 20/20 20/20-1 20/15-1    Exercise Activities and Dietary recommendations Current Exercise Habits: Home exercise routine, Type of exercise: walking, Time (Minutes): 60, Frequency (Times/Week): 5, Weekly Exercise (Minutes/Week): 300, Intensity: Moderate, Exercise limited by: None identified  Goals    . Increase physical activity     Starting 12/16/2016, I will attempt to walk at least 10000 steps 3 days per week.       Fall Risk Fall Risk  12/16/2016 11/24/2015 11/30/2014 11/17/2013  Falls in the past year? No Yes No No  Comment  - pt fell while hiking; bruising and lower back pain - -  Number falls in past yr: - 1 - -  Injury with Fall? - Yes - -  Follow up - Falls evaluation completed - -   Depression Screen PHQ 2/9 Scores 11/24/2015 11/30/2014 11/17/2013 11/13/2011  PHQ - 2 Score 0 0 0 0     Cognitive Function MMSE - Mini Mental State Exam 12/16/2016 11/24/2015  Orientation to time 5 5  Orientation to Place 5  5  Registration 3 3  Attention/ Calculation 0 0  Recall 3 3  Language- name 2 objects 0 0  Language- repeat 1 1  Language- follow 3 step command 3 3  Language- read & follow direction 0 0  Write a sentence 0 0  Copy design 0 0  Total score 20 20     PLEASE NOTE: A Mini-Cog screen was completed. Maximum score is 20. A value of 0 denotes this part of [place] MMSE was not completed or the patient failed this part of the Mini-Cog screening.   Mini-Cog Screening Orientation to Time - Max 5 pts Orientation to Place - Max 5 pts Registration - Max 3 pts Recall - Max 3 pts Language Repeat - Max 1 pts Language Follow 3 Step Command - Max 3 pts     Immunization History  Administered Date(s) Administered  . Influenza Whole 01/21/2005, 11/10/2006  . Influenza, High Dose Seasonal PF 10/25/2016  . Influenza-Unspecified 10/31/2012, 11/21/2014, 10/05/2015  . Pneumococcal Conjugate-13 11/30/2014  . Pneumococcal Polysaccharide-23 11/17/2013  . Td 10/15/2001, 02/09/2010  . Zoster 09/04/2010  . Zoster Recombinat (Shingrix) 05/03/2016   Screening Tests Health Maintenance  Topic Date Due  . MAMMOGRAM  12/17/2016  . COLONOSCOPY  07/02/2019  . TETANUS/TDAP  02/10/2020  . INFLUENZA VACCINE  Completed  . DEXA SCAN  Completed  . Hepatitis C Screening  Completed  . PNA vac Low Risk Adult  Completed      Plan:     I have personally reviewed, addressed, and noted the following in the patient's chart:  A. Medical and social history B. Use of alcohol, tobacco or illicit drugs  C. Current medications and  supplements D. Functional ability and status E.  Nutritional status F.  Physical activity G. Advance directives H. List of other physicians I.  Hospitalizations, surgeries, and ER visits in previous 12 months J.  [place] to include hearing, vision, cognitive, depression L. Referrals and appointments - none  In addition, I have reviewed and discussed with patient certain preventive protocols, quality metrics, and best practice recommendations. A written personalized care plan for preventive services as well as general preventive health recommendations were provided to patient.  See attached scanned questionnaire for additional information.   Signed,   Lindell Noe, MHA, BS, LPN Health Coach

## 2016-12-16 NOTE — Patient Instructions (Signed)
Shannon [name] , Thank you for taking time to come for your Medicare Wellness Visit. I appreciate your ongoing commitment to your health goals. Please review the following plan we discussed and let me know if I can assist you in the future.   These are the goals we discussed: Goals    . Increase physical activity     Starting 12/16/2016, I will attempt to walk at least 10000 steps 3 days per week.        This is a list of the screening recommended for you and due dates:  Health Maintenance  Topic Date Due  . Mammogram  12/17/2016  . Colon Cancer Screening  07/02/2019  . Tetanus Vaccine  02/10/2020  . Flu Shot  Completed  . DEXA scan (bone density measurement)  Completed  .  Hepatitis C: One time screening is recommended by Center for Disease Control  (CDC) for  adults born from [birth year] through 1967.   Completed  . Pneumonia vaccines  Completed   Preventive Care for Adults  A healthy lifestyle and preventive care can promote health and wellness. Preventive health guidelines for adults include the following key practices.  . A routine yearly physical is a good way to check with your health care provider about your health and preventive screening. It is a chance to share any concerns and updates on your health and to receive a thorough exam.  . Visit your dentist for a routine exam and preventive care every 6 months. Brush your teeth twice a day and floss once a day. Good oral hygiene prevents tooth decay and gum disease.  . The frequency of eye exams is based on your age, health, family medical history, use  of contact lenses, and other factors. Follow your health care provider's recommendations for frequency of eye exams.  . Eat a healthy diet. Foods like vegetables, fruits, whole grains, low-fat dairy products, and lean protein foods contain the nutrients you need without too many calories. Decrease your intake of foods high in solid fats, added sugars, and salt. Eat the right amount of  calories for you. Get information about a proper diet from your health care provider, if necessary.  . Regular physical exercise is one of the most important things you can do for your health. Most adults should get at least 150 minutes of moderate-intensity exercise (any activity that increases your heart rate and causes you to sweat) each week. In addition, most adults need muscle-strengthening exercises on 2 or more days a week.  Silver Sneakers may be a benefit available to you. To determine eligibility, you may visit the website: www.silversneakers.com or contact program at [phone]) 116-6379 Mon-Fri between 8AM-8PM.   . Maintain a healthy weight. The body mass index (BMI) is a screening tool to identify possible weight problems. It provides an estimate of body fat based on height and weight. Your health care provider can find your BMI and can help you achieve or maintain a healthy weight.   For adults 20 years and older: ? A BMI below 18.5 is considered underweight. ? A BMI of 18.5 to 24.9 is normal. ? A BMI of 25 to 29.9 is considered overweight. ? A BMI of 30 and above is considered obese.   . Maintain normal blood lipids and cholesterol levels by exercising and minimizing your intake of saturated fat. Eat a balanced diet with plenty of fruit and vegetables. Blood tests for lipids and cholesterol should begin at age 30 and be repeated every 5  years. If your lipid or cholesterol levels are high, you are over 50, or you are at high risk for heart disease, you may need your cholesterol levels checked more frequently. Ongoing high lipid and cholesterol levels should be treated with medicines if diet and exercise are not working.  . If you smoke, find out from your health care provider how to quit. If you do not use tobacco, please do not start.  . If you choose to drink alcohol, please do not consume more than 2 drinks per day. One drink is considered to be 12 ounces (355 mL) of beer, 5 ounces  (148 mL) of wine, or 1.5 ounces (44 mL) of liquor.  . If you are 22-74 years old, ask your health care provider if you should take aspirin to prevent strokes.  . Use sunscreen. Apply sunscreen liberally and repeatedly throughout the day. You should seek shade when your shadow is shorter than you. Protect yourself by wearing long sleeves, pants, a wide-brimmed hat, and sunglasses year round, whenever you are outdoors.  . Once a month, do a whole body skin exam, using a mirror to look at the skin on your back. Tell your health care provider of new moles, moles that have irregular borders, moles that are larger than a pencil eraser, or moles that have changed in shape or color.

## 2016-12-18 ENCOUNTER — Ambulatory Visit
Admission: RE | Admit: 2016-12-18 | Discharge: 2016-12-18 | Disposition: A | Discharge status: Home / Self Care | Payer: Medicare Other | Source: Ambulatory Visit | Attending: Family Medicine | Admitting: Family Medicine

## 2016-12-18 DIAGNOSIS — Z1231 Encounter for screening mammogram for malignant neoplasm of breast: Secondary | ICD-10-CM

## 2016-12-19 ENCOUNTER — Other Ambulatory Visit: Payer: Self-pay | Admitting: Family Medicine

## 2016-12-19 DIAGNOSIS — R928 Other abnormal and inconclusive findings on diagnostic imaging of breast: Secondary | ICD-10-CM

## 2016-12-20 ENCOUNTER — Other Ambulatory Visit: Payer: Self-pay | Admitting: Family Medicine

## 2016-12-20 ENCOUNTER — Ambulatory Visit
Admission: RE | Admit: 2016-12-20 | Discharge: 2016-12-20 | Disposition: A | Discharge status: Home / Self Care | Payer: Medicare Other | Source: Ambulatory Visit | Attending: Family Medicine | Admitting: Family Medicine

## 2016-12-20 ENCOUNTER — Ambulatory Visit: Payer: Medicare Other

## 2016-12-20 DIAGNOSIS — R928 Other abnormal and inconclusive findings on diagnostic imaging of breast: Secondary | ICD-10-CM

## 2016-12-20 DIAGNOSIS — N6001 Solitary cyst of right breast: Secondary | ICD-10-CM

## 2016-12-25 ENCOUNTER — Encounter: Payer: Self-pay | Admitting: Family Medicine

## 2016-12-25 ENCOUNTER — Ambulatory Visit (INDEPENDENT_AMBULATORY_CARE_PROVIDER_SITE_OTHER): Payer: Medicare Other | Admitting: Family Medicine

## 2016-12-25 VITALS — BP 116/64 | HR 93 | Temp 98.5°F | Ht 68.25 in | Wt 157.2 lb

## 2016-12-25 DIAGNOSIS — R739 Hyperglycemia, unspecified: Secondary | ICD-10-CM

## 2016-12-25 DIAGNOSIS — L570 Actinic keratosis: Secondary | ICD-10-CM | POA: Insufficient documentation

## 2016-12-25 DIAGNOSIS — R32 Unspecified urinary incontinence: Secondary | ICD-10-CM | POA: Diagnosis not present

## 2016-12-25 DIAGNOSIS — N814 Uterovaginal prolapse, unspecified: Secondary | ICD-10-CM | POA: Diagnosis not present

## 2016-12-25 DIAGNOSIS — Z0000 Encounter for general adult medical examination without abnormal findings: Secondary | ICD-10-CM

## 2016-12-25 NOTE — Assessment & Plan Note (Signed)
Reviewed health habits including diet and exercise and skin cancer prevention Reviewed appropriate screening tests for age  Also reviewed health mt list, fam hx and immunization status , as well as social and family history   See HPI Labs reviewed  Mammogram rev with 6 mo f/u  Colitis is resolved and colonoscopy rev Nl dexa rev  utd imms  Enc good self care

## 2016-12-25 NOTE — Assessment & Plan Note (Signed)
Better with mybetriq- continues to see urologist

## 2016-12-25 NOTE — Assessment & Plan Note (Signed)
108 fasting  disc imp of low glycemic diet and wt loss to prevent DM2  Will continue to monitor  Enc to get most of carbs from produce

## 2016-12-25 NOTE — Patient Instructions (Addendum)
Try to get 1200-1500 mg of calcium per day with at least 1000 iu of vitamin D - for bone health   Try to watch sugar/carbs in diet  Try to get most of your carbohydrates from produce (with the exception of white potatoes)  Eat less bread/pasta/rice/snack foods/cereals/sweets and other items from the middle of the grocery store (processed carbs)   Make an appt to return to treat lesion on ear when you have time

## 2016-12-25 NOTE — Assessment & Plan Note (Signed)
Sees gyn  Doing better with vaginal estrogen tx

## 2016-12-25 NOTE — Progress Notes (Signed)
Subjective:    Patient ID: Shannon [name], female    DOB: 08/24/1950, 66 y.o.   MRN: [id number]  HPI  Here for health maintenance exam and to review chronic medical problems   Doing fine in general   Wt Readings from Last 3 Encounters:  12/25/16 157 lb 4 oz (71.3 kg)  12/16/16 157 lb 8 oz (71.4 kg)  12/13/16 160 lb 12.8 oz (72.9 kg)  wt looks good as usual  Eating healthy and getting exercise  23.73 kg/m  Had amw on 11/26 Disc mammogram appt which was done 11/30  Small nodule seen in R breast and 6 mo f/u recommended - has appt already Self breast exam -no lumps   Colonoscopy 6/18-polyps with 3 y recall  Colitis diag by CT in ED this fall-tx with cipro and flagyl Doing better now    dexa 11/17 BMD in the normal range  No falls or fractures  There is ca in mvi  Exercise is good   shingrix vaccine 4/18- and the booster    Glucose was 108 fasting   Cholesterol Lab Results  Component Value Date   CHOL 147 12/16/2016   CHOL 168 11/24/2015   CHOL 143 11/25/2014   Lab Results  Component Value Date   HDL 68.60 12/16/2016   HDL 85.40 11/24/2015   HDL 68.30 11/25/2014   Lab Results  Component Value Date   LDLCALC 68 12/16/2016   LDLCALC 74 11/24/2015   LDLCALC 67 11/25/2014   Lab Results  Component Value Date   TRIG 53.0 12/16/2016   TRIG 45.0 11/24/2015   TRIG 41.0 11/25/2014   Lab Results  Component Value Date   CHOLHDL 2 12/16/2016   CHOLHDL 2 11/24/2015   CHOLHDL 2 11/25/2014   No results found for: LDLDIRECT Very good profile  Diet is good   Other labs: Results for orders placed or performed in visit on 12/16/16  TSH  Result Value Ref Range   TSH 0.70 0.35 - 4.50 uIU/mL  Lipid panel  Result Value Ref Range   Cholesterol 147 0 - 200 mg/dL   Triglycerides 53.0 0.0 - 149.0 mg/dL   HDL 68.60 >39.00 mg/dL   VLDL 10.6 0.0 - 40.0 mg/dL   LDL Cholesterol 68 0 - 99 mg/dL   Total CHOL/HDL Ratio 2    NonHDL 78.46   Comprehensive metabolic  panel  Result Value Ref Range   Sodium 139 135 - 145 mEq/L   Potassium 4.4 3.5 - 5.1 mEq/L   Chloride 102 96 - 112 mEq/L   CO2 31 19 - 32 mEq/L   Glucose, Bld 108 (H) 70 - 99 mg/dL   BUN 27 (H) 6 - 23 mg/dL   Creatinine, Ser 0.82 0.40 - 1.20 mg/dL   Total Bilirubin 0.3 0.2 - 1.2 mg/dL   Alkaline Phosphatase 47 39 - 117 U/L   AST 21 0 - 37 U/L   ALT 17 0 - 35 U/L   Total Protein 6.6 6.0 - 8.3 g/dL   Albumin 3.8 3.5 - 5.2 g/dL   Calcium 9.2 8.4 - 10.5 mg/dL   GFR 73.60 >60.00 mL/min  CBC with Differential/Platelet  Result Value Ref Range   WBC 4.9 4.0 - 10.5 K/uL   RBC 4.42 3.87 - 5.11 Mil/uL   Hemoglobin 13.4 12.0 - 15.0 g/dL   HCT 40.8 36.0 - 46.0 %   MCV 92.4 78.0 - 100.0 fl   MCHC 32.7 30.0 - 36.0 g/dL   RDW 13.7  11.5 - 15.5 %   Platelets 258.0 150.0 - 400.0 K/uL   Neutrophils Relative % 54.0 43.0 - 77.0 %   Lymphocytes Relative 31.8 12.0 - 46.0 %   Monocytes Relative 11.5 3.0 - 12.0 %   Eosinophils Relative 1.9 0.0 - 5.0 %   Basophils Relative 0.8 0.0 - 3.0 %   Neutro Abs 2.6 1.4 - 7.7 K/uL   Lymphs Abs 1.6 0.7 - 4.0 K/uL   Monocytes Absolute 0.6 0.1 - 1.0 K/uL   Eosinophils Absolute 0.1 0.0 - 0.7 K/uL   Basophils Absolute 0.0 0.0 - 0.1 K/uL     Has a scab on L ear that never goes away- for years   Patient Active Problem List   Diagnosis Date Noted  . Actinic keratosis 12/25/2016  . Recurrent UTI 08/23/2016  . Chronic paronychia of finger 07/26/2016  . Infection of nail bed of finger, right 07/18/2016  . Cystitis 12/06/2015  . Estrogen deficiency 12/06/2015  . Elevated blood sugar 12/06/2015  . Post-operative state 07/04/2015  . Third degree uterine prolapse 04/12/2015  . Incontinence of urine in female 04/12/2015  . Pelvic relaxation due to uterine prolapse 03/27/2015  . Right shoulder pain 12/02/2014  . Neck pain on right side 12/02/2014  . Thoracic back pain 12/02/2014  . Medicare annual wellness visit, initial 11/30/2014  . Routine general medical  examination at a health care facility 11/05/2012  . Other screening mammogram 11/13/2011  . PLANTAR FASCIITIS, LEFT 07/28/2009  . PERSONAL HX COLONIC POLYPS 11/02/2007  . ESOPHAGITIS, MILD 03/31/2007  . GERD 10/20/2006   Past Medical History:  Diagnosis Date  . Blood transfusion without reported diagnosis   . Chronic neck and back pain    received physical therapy  . GERD (gastroesophageal reflux disease)    with esophagitis  . Numbness    Right outer thigh, constant, notices more with standing for a long period of time  . Plantar fasciitis    left   Past Surgical History:  Procedure Laterality Date  . BLADDER SUSPENSION N/A 07/04/2015   Procedure: TRANSVAGINAL TAPE (TVT) SLING                   ;  Surgeon: Emily Filbert, MD;  Location: [facility];  Service: Gynecology;  Laterality: N/A;  . COLONOSCOPY    . CYSTOCELE REPAIR N/A 07/04/2015   Procedure: ANTERIOR REPAIR (CYSTOCELE);  Surgeon: Emily Filbert, MD;  Location: [facility];  Service: Gynecology;  Laterality: N/A;  . CYSTOSCOPY N/A 07/04/2015   Procedure: CYSTOSCOPY;  Surgeon: Emily Filbert, MD;  Location: [facility];  Service: Gynecology;  Laterality: N/A;  . DILATION AND CURETTAGE OF UTERUS    . POLYPECTOMY    . TUBAL LIGATION  1987  . UPPER GI ENDOSCOPY    . VAGINAL HYSTERECTOMY N/A 07/04/2015   Procedure: TOTAL VAGINAL HYSTERECTOMY  ;  Surgeon: Emily Filbert, MD;  Location: [facility];  Service: Gynecology;  Laterality: N/A;   Social History   Tobacco Use  . Smoking status: Never Smoker  . Smokeless tobacco: Never Used  Substance Use Topics  . Alcohol use: No    Alcohol/week: 0.0 oz    Comment: rare  . Drug use: No   Family History  Problem Relation Age of Onset  . Stroke Mother        x 2  . Heart disease Mother        congenital arrhythmia and CHF  . Depression Mother   .  Prostate cancer Brother   . Stomach cancer Paternal Uncle   . Colon cancer Neg Hx   . Esophageal cancer Neg Hx   . Kidney cancer Neg Hx   . Bladder Cancer  Neg Hx    Allergies  Allergen Reactions  . Neomycin-Bacitracin Zn-Polymyx Rash    Ointment   Current Outpatient Medications on File Prior to Visit  Medication Sig Dispense Refill  . D-MANNOSE PO Take by mouth 2 (two) times daily.    Marland Kitchen docusate sodium (COLACE) 250 MG capsule Take 250 mg by mouth daily.    Marland Kitchen ESTROGENS, CONJUGATED VA Place vaginally 3 (three) times a week.    . famotidine (PEPCID) 20 MG tablet Take 20 mg by mouth daily as needed for heartburn or indigestion.    . Lactobacillus ([place] PRO-B PO) Take by mouth daily.    . mirabegron ER (MYRBETRIQ) 25 MG TB24 tablet Take 1 tablet (25 mg total) by mouth daily. 90 tablet 3  . Multiple Vitamin (MULTIVITAMIN) tablet Take 1 tablet by mouth daily.      Francella Solian Johns Wort 300 MG TABS Take 2 tablets by mouth 2 (two) times daily.    Marland Kitchen triamcinolone cream (KENALOG) 0.5 % Apply 1 application topically 2 (two) times daily. 30 g 0   Current Facility-Administered Medications on File Prior to Visit  Medication Dose Route Frequency Provider Last Rate Last Dose  . 0.9 %  sodium chloride infusion  500 mL Intravenous Continuous Milus Banister, MD        Review of Systems  Constitutional: Negative for activity change, appetite change, fatigue, fever and unexpected weight change.  HENT: Negative for congestion, ear pain, rhinorrhea, sinus pressure and sore throat.   Eyes: Negative for pain, redness and visual disturbance.  Respiratory: Negative for cough, shortness of breath and wheezing.   Cardiovascular: Negative for chest pain and palpitations.  Gastrointestinal: Negative for abdominal pain, blood in stool, constipation and diarrhea.  Endocrine: Negative for polydipsia and polyuria.  Genitourinary: Negative for dysuria, frequency and urgency.  Musculoskeletal: Negative for arthralgias, back pain and myalgias.  Skin: Negative for pallor and rash.       Pos for small scab L ear   Allergic/Immunologic: Negative for environmental allergies.    Neurological: Negative for dizziness, syncope and headaches.  Hematological: Negative for adenopathy. Does not bruise/bleed easily.  Psychiatric/Behavioral: Negative for decreased concentration and dysphoric mood. The patient is not nervous/anxious.        Objective:   Physical Exam  Constitutional: She appears well-developed and well-nourished. No distress.  Well appearing   HENT:  Head: Normocephalic and atraumatic.  Right Ear: External ear normal.  Left Ear: External ear normal.  Mouth/Throat: Oropharynx is clear and moist.  Eyes: Conjunctivae and EOM are normal. Pupils are equal, round, and reactive to light. No scleral icterus.  Neck: Normal range of motion. Neck supple. No JVD present. Carotid bruit is not present. No thyromegaly present.  Cardiovascular: Normal rate, regular rhythm, normal heart sounds and intact distal pulses. Exam reveals no gallop.  Pulmonary/Chest: Effort normal and breath sounds normal. No respiratory distress. She has no wheezes. She exhibits no tenderness.  Abdominal: Soft. Bowel sounds are normal. She exhibits no distension, no abdominal bruit and no mass. There is no tenderness.  Genitourinary: No breast swelling, tenderness, discharge or bleeding.  Genitourinary Comments: Breast exam: No mass, nodules, thickening, tenderness, bulging, retraction, inflamation, nipple discharge or skin changes noted.  No axillary or clavicular LA.  Musculoskeletal: Normal range of motion. She exhibits no edema or tenderness.  Lymphadenopathy:    She has no cervical adenopathy.  Neurological: She is alert. She has normal reflexes. No cranial nerve deficit. She exhibits normal muscle tone. Coordination normal.  Skin: Skin is warm and dry. No rash noted. No erythema. No pallor.  Small scab/scale area 1-2 mm on L pinna   Solar lentigines diffusely   Psychiatric: She has a normal mood and affect.          Assessment & Plan:   Problem List Items Addressed This  Visit      Genitourinary   Pelvic relaxation due to uterine prolapse    Sees gyn  Doing better with vaginal estrogen tx         Other   Elevated blood sugar    108 fasting  disc imp of low glycemic diet and wt loss to prevent DM2  Will continue to monitor  Enc to get most of carbs from produce       Incontinence of urine in female    Better with mybetriq- continues to see urologist       Routine general medical examination at a health care facility - Primary    Reviewed health habits including diet and exercise and skin cancer prevention Reviewed appropriate screening tests for age  Also reviewed health mt list, fam hx and immunization status , as well as social and family history   See HPI Labs reviewed  Mammogram rev with 6 mo f/u  Colitis is resolved and colonoscopy rev Nl dexa rev  utd imms  Enc good self care

## 2017-01-15 ENCOUNTER — Ambulatory Visit: Payer: Medicare Other | Admitting: Family Medicine

## 2017-01-15 ENCOUNTER — Encounter: Payer: Self-pay | Admitting: Family Medicine

## 2017-01-15 VITALS — BP 106/64 | HR 67 | Temp 98.3°F | Ht 68.25 in | Wt 158.0 lb

## 2017-01-15 DIAGNOSIS — L03012 Cellulitis of left finger: Secondary | ICD-10-CM

## 2017-01-15 DIAGNOSIS — L570 Actinic keratosis: Secondary | ICD-10-CM | POA: Diagnosis not present

## 2017-01-15 MED ORDER — KETOCONAZOLE 2 % EX CREA: 1.0000 "application " | TOPICAL_CREAM | Freq: Every day | CUTANEOUS | 0 refills | Status: DC

## 2017-01-15 NOTE — Assessment & Plan Note (Signed)
Resolved on R /now on L 5th finger No hx of trauma  Some nail changes (cannot r/o fungal)  No imp with abx in the past  inst to keep clean and dry  Continue kenalog cream at night Use ketoconazole during the day  Alert if not imp in 1-2 wk (? Consider derm consult)

## 2017-01-15 NOTE — Patient Instructions (Addendum)
Keep finger clean with soap and water and dry  Try not to submerge often  Dry well  Use the ketoconazole cream during the day under gauze and finger cot  Use the kenalog a night  Alert me if no improvement in the next week or two  We froze the lesion on your ear It should blister and then heal Keep clean with soap and water  Try not to touch it   Don't forget the sun protection

## 2017-01-15 NOTE — Progress Notes (Signed)
   Subjective:    Patient ID: Shannon [name], female    DOB: [birth year], 66 y.o.   MRN: [id number]  HPI Here to address lesion on L ear   L pinna  Scaly area- has been there for years  Actinic keratosis noted at her PE   No other areas   Has hx of paronychia (originally on R 5th finger)  Now that is better / but the other (L) finger is now bothering her  Tends to be recurrent   She wears rubber gloves now when washing dishes Does not generally submerge  In the past abx did not help  Using kenalog 0.5% now - she uses at night under a band aid Cleans with soap  This has thickened the base of the nail  Was on cipro/flagyl-made no difference  Not on any anti fugal     Review of Systems     Objective:   Physical Exam  Constitutional: She appears well-developed and well-nourished. No distress.  HENT:  Head: Normocephalic and atraumatic.  Neck: Normal range of motion. Neck supple.  Lymphadenopathy:    She has no cervical adenopathy.  Skin: Skin is warm and dry. No rash noted. No pallor.  2-3 mm area of scale w/o color on L pinna   L 5th finger - erythema (1-2 mm) from nail bed edge with minimal swelling and no fluctuance or drainage  Non tender  Base of the nail is pitted and yellow  No other skin changes or signs of trauma   Solar lentigines diffusely   Psychiatric: She has a normal mood and affect.          Assessment & Plan:   Problem List Items Addressed This Visit      Musculoskeletal and Integument   Actinic keratosis - Primary    2-3 mm AK on pinna of L ear tx with cryotx (liq nitrogen)-pt tolerated well  Aftercare discussed Alert if this does not improve/go away  Keep clean and dry      Chronic paronychia of finger    Resolved on R /now on L 5th finger No hx of trauma  Some nail changes (cannot r/o fungal)  No imp with abx in the past  inst to keep clean and dry  Continue kenalog cream at night Use ketoconazole during the day  Alert if not  imp in 1-2 wk (? Consider derm consult)

## 2017-01-15 NOTE — Assessment & Plan Note (Signed)
2-3 mm AK on pinna of L ear tx with cryotx (liq nitrogen)-pt tolerated well  Aftercare discussed Alert if this does not improve/go away  Keep clean and dry

## 2017-03-26 ENCOUNTER — Ambulatory Visit: Payer: Medicare Other | Admitting: Family Medicine

## 2017-04-03 ENCOUNTER — Encounter: Payer: Self-pay | Admitting: Internal Medicine

## 2017-04-03 ENCOUNTER — Ambulatory Visit: Payer: Medicare Other | Admitting: Internal Medicine

## 2017-04-03 VITALS — BP 112/68 | HR 66 | Temp 98.2°F | Wt 156.0 lb

## 2017-04-03 DIAGNOSIS — R3915 Urgency of urination: Secondary | ICD-10-CM | POA: Diagnosis not present

## 2017-04-03 DIAGNOSIS — R350 Frequency of micturition: Secondary | ICD-10-CM | POA: Diagnosis not present

## 2017-04-03 DIAGNOSIS — L03032 Cellulitis of left toe: Secondary | ICD-10-CM

## 2017-04-03 MED ORDER — CEPHALEXIN 500 MG PO CAPS: 500.0000 mg | ORAL_CAPSULE | Freq: Two times a day (BID) | ORAL | 0 refills | Status: DC

## 2017-04-03 NOTE — Patient Instructions (Signed)
Paronychia  Paronychia is an infection of the skin. It happens near a fingernail or toenail. It may cause pain and swelling around the nail. Usually, it is not serious and it clears up with treatment.  Follow these instructions at home:   Soak the fingers or toes in warm water as told by your doctor. You may be told to do this for 20 minutes, 2-3 times a day.   Keep the area dry when you are not soaking it.   Take medicines only as told by your doctor.   If you were given an antibiotic medicine, finish all of it even if you start to feel better.   Keep the affected area clean.   Do not try to drain a fluid-filled bump yourself.   Wear rubber gloves when putting your hands in water.   Wear gloves if your hands might touch cleaners or chemicals.   Follow your doctor's instructions about:  ? Wound care.  ? Bandage (dressing) changes and removal.  Contact a doctor if:   Your symptoms get worse or do not improve.   You have a fever or chills.   You have redness spreading from the affected area.   You have more fluid, blood, or pus coming from the affected area.   Your finger or knuckle is swollen or is hard to move.  This information is not intended to replace advice given to you by your health care provider. Make sure you discuss any questions you have with your health care provider.  Document Released: 12/26/2008 Document Revised: 06/15/2015 Document Reviewed: 12/15/2013  [employer] Interactive Patient Education  2018 [employer]

## 2017-04-03 NOTE — Progress Notes (Signed)
Subjective:    Patient ID: Shannon [name], female    DOB: Dec 23, 1950, 67 y.o.   MRN: [id number]  HPI  Pt presents to the clinic today with c/o redness, swelling and pain of her 2nd toe, left foot. She reports this started 2 weeks ago. She has not noticed any drainage from the area. She denies fever, chills or body aches. She has tried Neosporin and Epsom Salt soaks with minimal relief. She reports she had an abscess in that same area 1 month ago.  She also c/o urinary urgency and frequency. This started yesterday. She denies dysuria, blood in her urine, fever, chills, nausea or low back pain. She denies vaginal complaints. She has not taken anything OTC for her symptoms.   Review of Systems  Past Medical History:  Diagnosis Date  . Blood transfusion without reported diagnosis   . Chronic neck and back pain    received physical therapy  . GERD (gastroesophageal reflux disease)    with esophagitis  . Numbness    Right outer thigh, constant, notices more with standing for a long period of time  . Plantar fasciitis    left    Current Outpatient Medications  Medication Sig Dispense Refill  . D-MANNOSE PO Take by mouth 2 (two) times daily.    Marland Kitchen docusate sodium (COLACE) 250 MG capsule Take 250 mg by mouth daily.    Marland Kitchen ESTROGENS, CONJUGATED [facility] vaginally 3 (three) times a week.    . famotidine (PEPCID) 20 MG tablet Take 20 mg by mouth daily as needed for heartburn or indigestion.    . Lactobacillus ([place] PRO-B PO) Take by mouth daily.    . mirabegron ER (MYRBETRIQ) 25 MG TB24 tablet Take 1 tablet (25 mg total) by mouth daily. 90 tablet 3  . Multiple Vitamin (MULTIVITAMIN) tablet Take 1 tablet by mouth daily.      Francella Solian Johns Wort 300 MG TABS Take 2 tablets by mouth 2 (two) times daily.     No current facility-administered medications for this visit.     Allergies  Allergen Reactions  . Neomycin-Bacitracin Zn-Polymyx Rash    Ointment    Family History  Problem Relation  Age of Onset  . Stroke Mother        x 2  . Heart disease Mother        congenital arrhythmia and CHF  . Depression Mother   . Prostate cancer Brother   . Stomach cancer Paternal Uncle   . Colon cancer Neg Hx   . Esophageal cancer Neg Hx   . Kidney cancer Neg Hx   . Bladder Cancer Neg Hx     Social History   Socioeconomic History  . Marital status: Married    Spouse name: Not on file  . Number of children: Not on file  . Years of education: Not on file  . Highest education level: Not on file  Social Needs  . Financial resource strain: Not on file  . Food insecurity - worry: Not on file  . Food insecurity - inability: Not on file  . Transportation needs - medical: Not on file  . Transportation needs - non-medical: Not on file  Occupational History  . Not on file  Tobacco Use  . Smoking status: Never Smoker  . Smokeless tobacco: Never Used  Substance and Sexual Activity  . Alcohol use: No    Alcohol/week: 0.0 oz    Comment: rare  . Drug use: No  .  Sexual activity: Yes    Birth control/protection: Post-menopausal, Surgical  Other Topics Concern  . Not on file  Social History Narrative  . Not on file     Constitutional: Denies fever, malaise, fatigue, headache or abrupt weight changes.  Gastrointestinal: Denies abdominal pain, bloating, constipation, diarrhea or blood in the stool.  GU: Pt reports urgency and frequency. Denies pain with urination, burning sensation, blood in urine, odor or discharge. Musculoskeletal: Pt reports swelling of 2nd toe, left foot. Denies decrease in range of motion, difficulty with gait, muscle pain.  Skin: Pt reports redness of 2nd toe, left foot. Denies redness, rashes, lesions or ulcercations.    No other specific complaints in a complete review of systems (except as listed in HPI above).     Objective:   Physical Exam   BP 112/68   Pulse 66   Temp 98.2 F (36.8 C) (Oral)   Wt 156 lb (70.8 kg)   SpO2 98%   BMI 23.55 kg/m    Wt Readings from Last 3 Encounters:  04/03/17 156 lb (70.8 kg)  01/15/17 158 lb (71.7 kg)  12/25/16 157 lb 4 oz (71.3 kg)    General: Appears her stated age, in NAD. Skin: Paronychia noted of left 2nd toe, no drainage noted. Abdomen: Soft and nontender. No CVA tenderness noted.   BMET    Component Value Date/Time   NA 139 12/16/2016 1409   NA 140 07/20/2012 1050   K 4.4 12/16/2016 1409   K 3.8 07/20/2012 1050   CL 102 12/16/2016 1409   CL 105 07/20/2012 1050   CO2 31 12/16/2016 1409   CO2 26 07/20/2012 1050   GLUCOSE 108 (H) 12/16/2016 1409   GLUCOSE 178 (H) 07/20/2012 1050   BUN 27 (H) 12/16/2016 1409   BUN 19 (H) 07/20/2012 1050   CREATININE 0.82 12/16/2016 1409   CREATININE 1.06 07/20/2012 1050   CALCIUM 9.2 12/16/2016 1409   CALCIUM 9.3 07/20/2012 1050   GFRNONAA >60 12/08/2016 0913   GFRNONAA 56 (L) 07/20/2012 1050   GFRAA >60 12/08/2016 0913   GFRAA >60 07/20/2012 1050    Lipid Panel     Component Value Date/Time   CHOL 147 12/16/2016 1409   TRIG 53.0 12/16/2016 1409   HDL 68.60 12/16/2016 1409   CHOLHDL 2 12/16/2016 1409   VLDL 10.6 12/16/2016 1409   LDLCALC 68 12/16/2016 1409    CBC    Component Value Date/Time   WBC 4.9 12/16/2016 1409   RBC 4.42 12/16/2016 1409   HGB 13.4 12/16/2016 1409   HGB 13.4 07/20/2012 1050   HCT 40.8 12/16/2016 1409   HCT 37.9 07/20/2012 1050   PLT 258.0 12/16/2016 1409   PLT 247 07/20/2012 1050   MCV 92.4 12/16/2016 1409   MCV 87 07/20/2012 1050   MCH 30.2 12/08/2016 0913   MCHC 32.7 12/16/2016 1409   RDW 13.7 12/16/2016 1409   RDW 13.2 07/20/2012 1050   LYMPHSABS 1.6 12/16/2016 1409   MONOABS 0.6 12/16/2016 1409   EOSABS 0.1 12/16/2016 1409   BASOSABS 0.0 12/16/2016 1409    Hgb A1C No results found for: HGBA1C         Assessment & Plan:   Paronychia of 2nd Toe, Left Foot:  eRx for Keflex 500 mg BID x 7 days Continue Epsom Salt soaks  Urinary Urgency and Frequency:  She was unable to give a  urine sample eRx for Keflex 500 mg BID x 7 days Push fluids  RTC as needed  or if symptoms persist or worsen Emine Lopata, NP

## 2017-04-07 ENCOUNTER — Ambulatory Visit: Payer: Medicare Other | Admitting: Obstetrics & Gynecology

## 2017-04-25 ENCOUNTER — Encounter: Payer: Self-pay | Admitting: Obstetrics & Gynecology

## 2017-04-25 ENCOUNTER — Ambulatory Visit (INDEPENDENT_AMBULATORY_CARE_PROVIDER_SITE_OTHER): Payer: Medicare Other | Admitting: Obstetrics & Gynecology

## 2017-04-25 VITALS — BP 101/66 | HR 72 | Wt 158.0 lb

## 2017-04-25 DIAGNOSIS — Z0000 Encounter for general adult medical examination without abnormal findings: Secondary | ICD-10-CM

## 2017-04-25 NOTE — Progress Notes (Signed)
LAST PAP 2018

## 2017-04-25 NOTE — Progress Notes (Signed)
Subjective:    Shannon [name] is a 67 y.o. married P1 85 yo son and adopted son 51 yo) female who presents for an annual exam. The patient has no complaints today. She is happy with the pessary. She has some urge incontinence. She saw a urologist who gave her Myerbetriq The patient is not currently sexually active. GYN screening history: last pap: was normal. The patient wears seatbelts: yes. The patient participates in regular exercise: yes. Has the patient ever been transfused or tattooed?: yes. (transfusion with miscarriage).  The patient reports that there is not domestic violence in her life.   Menstrual History: OB History    Gravida  2   Para  1   Term  1   Preterm      AB  1   Living  1     SAB  1   TAB      Ectopic      Multiple      Live Births  1           Menarche age: 46 No LMP recorded. Patient has had a hysterectomy.    The following portions of the patient's history were reviewed and updated as appropriate: allergies, current medications, past family history, past medical history, past social history, past surgical history and problem list.  Review of Systems Pertinent items are noted in HPI.   Married since 1971 Denies dyspareunia Had a flu vaccine this season Fh- no breast, gyn, colon cancer Had a colonoscopy most recently 2017, has polyps Mammogram UTD   Objective:    BP 101/66   Pulse 72   Wt 158 lb (71.7 kg)   BMI 23.85 kg/m   General Appearance:    Alert, cooperative, no distress, appears stated age  Head:    Normocephalic, without obvious abnormality, atraumatic  Eyes:    PERRL, conjunctiva/corneas clear, EOM's intact, fundi    benign, both eyes  Ears:    Normal TM's and external ear canals, both ears  Nose:   Nares normal, septum midline, mucosa normal, no drainage    or sinus tenderness  Throat:   Lips, mucosa, and tongue normal; teeth and gums normal  Neck:   Supple, symmetrical, trachea midline, no adenopathy;    thyroid:   no enlargement/tenderness/nodules; no carotid   bruit or JVD  Back:     Symmetric, no curvature, ROM normal, no CVA tenderness  Lungs:     Clear to auscultation bilaterally, respirations unlabored  Chest Wall:    No tenderness or deformity   Heart:    Regular rate with occasional irregular beat (her dentist noticed this in the past as well)  Breast Exam:    No tenderness, masses, or nipple abnormality  Abdomen:     Soft, non-tender, bowel sounds active all four quadrants,    no masses, no organomegaly  Genitalia:    Normal female without lesion, discharge or tenderness, no excoriation from pessary, no masses on bimanual exam     Extremities:   Extremities normal, atraumatic, no cyanosis or edema  Pulses:   2+ and symmetric all extremities  Skin:   Skin color, texture, turgor normal, no rashes or lesions  Lymph nodes:   Cervical, supraclavicular, and axillary nodes normal  Neurologic:   CNII-XII intact, normal strength, sensation and reflexes    throughout  .    Assessment:    Healthy female exam.    Plan:   Heathy lifestyle discussed Rec that she see her  fam med doc about her heart Check hba1c

## 2017-04-26 ENCOUNTER — Encounter: Payer: Self-pay | Admitting: Family Medicine

## 2017-04-26 LAB — HEMOGLOBIN A1C
Est. average glucose Bld gHb Est-mCnc: 126 mg/dL
Hgb A1c MFr Bld: 6 % — ABNORMAL HIGH (ref 4.8–5.6)

## 2017-05-05 ENCOUNTER — Encounter: Payer: Self-pay | Admitting: Family Medicine

## 2017-05-05 ENCOUNTER — Ambulatory Visit: Payer: Medicare Other | Admitting: Family Medicine

## 2017-05-05 VITALS — BP 98/62 | HR 67 | Temp 98.0°F | Ht 68.5 in | Wt 156.5 lb

## 2017-05-05 DIAGNOSIS — I499 Cardiac arrhythmia, unspecified: Secondary | ICD-10-CM

## 2017-05-05 DIAGNOSIS — R739 Hyperglycemia, unspecified: Secondary | ICD-10-CM | POA: Diagnosis not present

## 2017-05-05 DIAGNOSIS — R002 Palpitations: Secondary | ICD-10-CM | POA: Diagnosis not present

## 2017-05-05 NOTE — Assessment & Plan Note (Signed)
A1C was elevated at 6.0 with gyn  Not overweight or high risk for DM disc imp of low glycemic diet and wt loss to prevent DM2  Will follow

## 2017-05-05 NOTE — Assessment & Plan Note (Signed)
On exam- sounds like she has premature beat every 5-10 beats (sitting) and nl (supine) She feels this on and off  Reassuring EKG (nl while lying)  fam hx of a fib  Ref to cardiol for further eval  Alert if symptoms worsen in the meantime  Enc to keep avoiding caffeine or stimulants  Lab Results  Component Value Date   TSH 0.70 12/16/2016    Rev last nl labs

## 2017-05-05 NOTE — Patient Instructions (Addendum)
To avoid diabetes Try to get most of your carbohydrates from produce (with the exception of white potatoes)  Eat less bread/pasta/rice/snack foods/cereals/sweets and other items from the middle of the grocery store (processed carbs)   Keep exercising   We will refer you to cardiology for the palpitations   Keep avoiding caffeine

## 2017-05-05 NOTE — Progress Notes (Signed)
Subjective:    Patient ID: Shannon [name], female    DOB: 07-23-1950, 67 y.o.   MRN: [id number]  HPI Here for report of an irregular heart beat   Back in January her dentist said she had irreg heartbeat Also noticed at gyn   occ she feels "skip and thump" - several times per day (more noticeable if quiet) No cp or sob  Not fast   Walks 10,000 steps per day and no exertional symptoms   Brother had afib  Mother had cva-poss from a fib  Maunt-pace maker   No caffeine  Not stressed  In good shape    Also A1C found at her gyn of 6.0  No diabetes in the family  Low bmi  No risk factors for diabetes   Diet is good  Now making effort to look at foods/labels for sugar and carbs  No soda or etoh and not a lot of sugar    Wt Readings from Last 3 Encounters:  05/05/17 156 lb 8 oz (71 kg)  04/25/17 158 lb (71.7 kg)  04/03/17 156 lb (70.8 kg)  23.45 kg/m  EKG today NSR with rate of 65 and no acute changes   BP Readings from Last 3 Encounters:  05/05/17 98/62  04/25/17 101/66  04/03/17 112/68   Pulse Readings from Last 3 Encounters:  05/05/17 67  04/25/17 72  04/03/17 66    Lab Results  Component Value Date   TSH 0.70 12/16/2016    Lab Results  Component Value Date   CREATININE 0.82 12/16/2016   BUN 27 (H) 12/16/2016   NA 139 12/16/2016   K 4.4 12/16/2016   CL 102 12/16/2016   CO2 31 12/16/2016   Lab Results  Component Value Date   ALT 17 12/16/2016   AST 21 12/16/2016   ALKPHOS 47 12/16/2016   BILITOT 0.3 12/16/2016     Patient Active Problem List   Diagnosis Date Noted  . Palpitations 05/05/2017  . Actinic keratosis 12/25/2016  . Recurrent UTI 08/23/2016  . Chronic paronychia of finger 07/26/2016  . Estrogen deficiency 12/06/2015  . Elevated blood sugar 12/06/2015  . Third degree uterine prolapse 04/12/2015  . Incontinence of urine in female 04/12/2015  . Pelvic relaxation due to uterine prolapse 03/27/2015  . Medicare annual wellness  visit, initial 11/30/2014  . Routine general medical examination at a health care facility 11/05/2012  . Other screening mammogram 11/13/2011  . PLANTAR FASCIITIS, LEFT 07/28/2009  . PERSONAL HX COLONIC POLYPS 11/02/2007  . GERD 10/20/2006   Past Medical History:  Diagnosis Date  . Blood transfusion without reported diagnosis   . Chronic neck and back pain    received physical therapy  . GERD (gastroesophageal reflux disease)    with esophagitis  . Numbness    Right outer thigh, constant, notices more with standing for a long period of time  . Plantar fasciitis    left   Past Surgical History:  Procedure Laterality Date  . BLADDER SUSPENSION N/A 07/04/2015   Procedure: TRANSVAGINAL TAPE (TVT) SLING                   ;  Surgeon: Emily Filbert, MD;  Location: [facility];  Service: Gynecology;  Laterality: N/A;  . COLONOSCOPY    . CYSTOCELE REPAIR N/A 07/04/2015   Procedure: ANTERIOR REPAIR (CYSTOCELE);  Surgeon: Emily Filbert, MD;  Location: [facility];  Service: Gynecology;  Laterality: N/A;  . CYSTOSCOPY N/A 07/04/2015  Procedure: CYSTOSCOPY;  Surgeon: Emily Filbert, MD;  Location: [facility];  Service: Gynecology;  Laterality: N/A;  . DILATION AND CURETTAGE OF UTERUS    . POLYPECTOMY    . TUBAL LIGATION  1987  . UPPER GI ENDOSCOPY    . VAGINAL HYSTERECTOMY N/A 07/04/2015   Procedure: TOTAL VAGINAL HYSTERECTOMY  ;  Surgeon: Emily Filbert, MD;  Location: [facility];  Service: Gynecology;  Laterality: N/A;   Social History   Tobacco Use  . Smoking status: Never Smoker  . Smokeless tobacco: Never Used  Substance Use Topics  . Alcohol use: No    Alcohol/week: 0.0 oz    Comment: rare  . Drug use: No   Family History  Problem Relation Age of Onset  . Stroke Mother        x 2  . Heart disease Mother        congenital arrhythmia and CHF  . Depression Mother   . Prostate cancer Brother   . Stomach cancer Paternal Uncle   . Colon cancer Neg Hx   . Esophageal cancer Neg Hx   . Kidney cancer Neg Hx    . Bladder Cancer Neg Hx    Allergies  Allergen Reactions  . Neomycin-Bacitracin Zn-Polymyx Rash    Ointment   Current Outpatient Medications on File Prior to Visit  Medication Sig Dispense Refill  . D-MANNOSE PO Take by mouth 2 (two) times daily.    Marland Kitchen ESTROGENS, CONJUGATED [facility] vaginally 3 (three) times a week.    . famotidine (PEPCID) 20 MG tablet Take 20 mg by mouth daily as needed for heartburn or indigestion.    . Lactobacillus ([place] PRO-B PO) Take by mouth daily.    . mirabegron ER (MYRBETRIQ) 25 MG TB24 tablet Take 1 tablet (25 mg total) by mouth daily. 90 tablet 3  . Multiple Vitamin (MULTIVITAMIN) tablet Take 1 tablet by mouth daily.      Francella Solian Johns Wort 300 MG TABS Take 2 tablets by mouth 2 (two) times daily.     No current facility-administered medications on file prior to visit.     Review of Systems  Constitutional: Negative for activity change, appetite change, fatigue, fever and unexpected weight change.  HENT: Negative for congestion, ear pain, rhinorrhea, sinus pressure and sore throat.   Eyes: Negative for pain, redness and visual disturbance.  Respiratory: Negative for cough, shortness of breath and wheezing.   Cardiovascular: Positive for palpitations. Negative for chest pain.  Gastrointestinal: Negative for abdominal pain, blood in stool, constipation and diarrhea.  Endocrine: Negative for polydipsia and polyuria.  Genitourinary: Negative for dysuria, frequency and urgency.  Musculoskeletal: Negative for arthralgias, back pain and myalgias.  Skin: Negative for pallor and rash.  Allergic/Immunologic: Negative for environmental allergies.  Neurological: Negative for dizziness, syncope and headaches.  Hematological: Negative for adenopathy. Does not bruise/bleed easily.  Psychiatric/Behavioral: Negative for decreased concentration and dysphoric mood. The patient is not nervous/anxious.        Objective:   Physical Exam  Constitutional: She appears  well-developed and well-nourished. No distress.  Well appearing   HENT:  Head: Normocephalic and atraumatic.  Mouth/Throat: Oropharynx is clear and moist.  Eyes: Pupils are equal, round, and reactive to light. Conjunctivae and EOM are normal.  Neck: Normal range of motion. Neck supple. No JVD present. Carotid bruit is not present. No thyromegaly present.  Cardiovascular: Normal rate, normal heart sounds and intact distal pulses. Exam reveals no gallop.  Sounds like early  beat (while sitting) every 5-10th beat  Lying down -nl rhythm   Pulmonary/Chest: Effort normal and breath sounds normal. No respiratory distress. She has no wheezes. She has no rales.  No crackles  Abdominal: Soft. Bowel sounds are normal. She exhibits no distension, no abdominal bruit and no mass. There is no tenderness.  Musculoskeletal: She exhibits no edema.  Lymphadenopathy:    She has no cervical adenopathy.  Neurological: She is alert. She has normal reflexes.  Skin: Skin is warm and dry. No rash noted. No pallor.  Psychiatric: She has a normal mood and affect.  Nl affect Pleasant           Assessment & Plan:   Problem List Items Addressed This Visit      Other   Elevated blood sugar    A1C was elevated at 6.0 with gyn  Not overweight or high risk for DM disc imp of low glycemic diet and wt loss to prevent DM2  Will follow       Palpitations - Primary    On exam- sounds like she has premature beat every 5-10 beats (sitting) and nl (supine) She feels this on and off  Reassuring EKG (nl while lying)  fam hx of a fib  Ref to cardiol for further eval  Alert if symptoms worsen in the meantime  Enc to keep avoiding caffeine or stimulants  Lab Results  Component Value Date   TSH 0.70 12/16/2016    Rev last nl labs       Relevant Orders   Ambulatory referral to Cardiology    Other Visit Diagnoses    Irregular heart beat       Relevant Orders   EKG 12-Lead (Completed)

## 2017-05-12 ENCOUNTER — Ambulatory Visit: Payer: Medicare Other | Admitting: Cardiology

## 2017-05-12 ENCOUNTER — Encounter: Payer: Self-pay | Admitting: Cardiology

## 2017-05-12 VITALS — BP 110/66 | HR 68 | Ht 68.5 in | Wt 156.4 lb

## 2017-05-12 DIAGNOSIS — R739 Hyperglycemia, unspecified: Secondary | ICD-10-CM | POA: Diagnosis not present

## 2017-05-12 DIAGNOSIS — R002 Palpitations: Secondary | ICD-10-CM

## 2017-05-12 DIAGNOSIS — R0609 Other forms of dyspnea: Secondary | ICD-10-CM | POA: Diagnosis not present

## 2017-05-12 NOTE — Progress Notes (Signed)
Cardiology Consultation:    Date:  05/12/2017   ID:  Shannon [name], DOB 22-Oct-1950, MRN [id number]  PCP:  Abner Greenspan, MD  Cardiologist:  Jenne Campus, MD   Referring MD: Abner Greenspan, MD   Chief Complaint  Patient presents with  . Palpitations  . Irregular Heart Beat    being heard by different providers  Have palpitations  History of Present Illness:    Shannon [name] is a 67 y.o. female who is being seen today for the evaluation of palpitations at the request of Tower, Shannon Fanny, MD.  For last few weeks she is experiencing palpitations she went to her dentist who checked her pulse and told her she got irregular heartbeats and she went to her gynecologist again pulse was checked she was told to have irregularity of the heart rate.  She is very worried because her mother as well as her sister and of having atrial fibrillation.  She fell some skipped beats also some strong thumping in the chest.  Those happened typically at rest.  Does not happen with exertion.  She is very active she walks every single day she does have fit bit and every day she does at least 10,000 steps typically more than that.  She is also very strict about her diet she is vegetarian she watch her diet very carefully.  Never had any heart trouble no chest pain tightness squeezing pressure burning chest.  She does get some shortness of breath while walking.  Past Medical History:  Diagnosis Date  . Blood transfusion without reported diagnosis   . Chronic neck and back pain    received physical therapy  . GERD (gastroesophageal reflux disease)    with esophagitis  . Numbness    Right outer thigh, constant, notices more with standing for a long period of time  . Plantar fasciitis    left    Past Surgical History:  Procedure Laterality Date  . BLADDER SUSPENSION N/A 07/04/2015   Procedure: TRANSVAGINAL TAPE (TVT) SLING                   ;  Surgeon: Emily Filbert, MD;  Location: [facility];  Service:  Gynecology;  Laterality: N/A;  . COLONOSCOPY    . CYSTOCELE REPAIR N/A 07/04/2015   Procedure: ANTERIOR REPAIR (CYSTOCELE);  Surgeon: Emily Filbert, MD;  Location: [facility];  Service: Gynecology;  Laterality: N/A;  . CYSTOSCOPY N/A 07/04/2015   Procedure: CYSTOSCOPY;  Surgeon: Emily Filbert, MD;  Location: [facility];  Service: Gynecology;  Laterality: N/A;  . DILATION AND CURETTAGE OF UTERUS    . POLYPECTOMY    . TUBAL LIGATION  1987  . UPPER GI ENDOSCOPY    . VAGINAL HYSTERECTOMY N/A 07/04/2015   Procedure: TOTAL VAGINAL HYSTERECTOMY  ;  Surgeon: Emily Filbert, MD;  Location: [facility];  Service: Gynecology;  Laterality: N/A;    Current Medications: Current Meds  Medication Sig  . D-MANNOSE PO Take by mouth 2 (two) times daily.  Marland Kitchen ESTROGENS, CONJUGATED VA Place vaginally 3 (three) times a week.  . famotidine (PEPCID) 20 MG tablet Take 20 mg by mouth daily as needed for heartburn or indigestion.  . Lactobacillus ([place] PRO-B PO) Take by mouth daily.  . mirabegron ER (MYRBETRIQ) 25 MG TB24 tablet Take 1 tablet (25 mg total) by mouth daily.  . Multiple Vitamin (MULTIVITAMIN) tablet Take 1 tablet by mouth daily.    . [place]  300 MG TABS Take 2 tablets by mouth 2 (two) times daily.     Allergies:   Neomycin-bacitracin zn-polymyx   Social History   Socioeconomic History  . Marital status: Married    Spouse name: Not on file  . Number of children: Not on file  . Years of education: Not on file  . Highest education level: Not on file  Occupational History  . Not on file  Social Needs  . Financial resource strain: Not on file  . Food insecurity:    Worry: Not on file    Inability: Not on file  . Transportation needs:    Medical: Not on file    Non-medical: Not on file  Tobacco Use  . Smoking status: Never Smoker  . Smokeless tobacco: Never Used  Substance and Sexual Activity  . Alcohol use: No    Alcohol/week: 0.0 oz    Comment: rare  . Drug use: No  . Sexual activity: Yes     Birth control/protection: Post-menopausal, Surgical  Lifestyle  . Physical activity:    Days per week: Not on file    Minutes per session: Not on file  . Stress: Not on file  Relationships  . Social connections:    Talks on phone: Not on file    Gets together: Not on file    Attends religious service: Not on file    Active member of club or organization: Not on file    Attends meetings of clubs or organizations: Not on file    Relationship status: Not on file  Other Topics Concern  . Not on file  Social History Narrative  . Not on file     Family History: The patient's family history includes Depression in her mother; Heart disease in her mother; Prostate cancer in her brother; Stomach cancer in her paternal uncle; Stroke in her mother. There is no history of Colon cancer, Esophageal cancer, Kidney cancer, or Bladder Cancer. ROS:   Please see the history of present illness.    All 14 point review of systems negative except as described per history of present illness.  EKGs/Labs/Other Studies Reviewed:    The following studies were reviewed today: Cholesterol LDL 68 and HDL 68.6  EKG done by primary care physician showed normal sinus rhythm normal P interval normal QS complex duration morphology no ST segment changes normal EKG  Recent Labs: 12/16/2016: ALT 17; BUN 27; Creatinine, Ser 0.82; Hemoglobin 13.4; Platelets 258.0; Potassium 4.4; Sodium 139; TSH 0.70  Recent Lipid Panel    Component Value Date/Time   CHOL 147 12/16/2016 1409   TRIG 53.0 12/16/2016 1409   HDL 68.60 12/16/2016 1409   CHOLHDL 2 12/16/2016 1409   VLDL 10.6 12/16/2016 1409   LDLCALC 68 12/16/2016 1409    Physical Exam:    VS:  BP 110/66 (BP Location: Right Arm)   Pulse 68   Ht 5' 8.5" (1.74 m)   Wt 156 lb 6.4 oz (70.9 kg)   SpO2 97%   BMI 23.43 kg/m     Wt Readings from Last 3 Encounters:  05/12/17 156 lb 6.4 oz (70.9 kg)  05/05/17 156 lb 8 oz (71 kg)  04/25/17 158 lb (71.7 kg)      GEN:  Well nourished, well developed in no acute distress HEENT: Normal NECK: No JVD; No carotid bruits LYMPHATICS: No lymphadenopathy CARDIAC: RRR, no murmurs, no rubs, no gallops RESPIRATORY:  Clear to auscultation without rales, wheezing or rhonchi  ABDOMEN: Soft, non-tender,  non-distended MUSCULOSKELETAL:  No edema; No deformity  SKIN: Warm and dry NEUROLOGIC:  Alert and oriented x 3 PSYCHIATRIC:  Normal affect   ASSESSMENT:    1. Palpitations   2. Elevated blood sugar   3. Dyspnea on exertion    PLAN:    In order of problems listed above:  1. Palpitations: I suspect she got some extrasystole.  I will ask her to wear Holter monitor for 48 hours to see exactly what kind of arrhythmia with dealing with  I will not initiate any treatment for this arrhythmia until I will know exactly what arrhythmia she has.  As a part of evaluation I will ask her to have an echocardiogram done especially in view of the fact that she does have some dyspnea on exertion. 2. Elevated blood sugar.  We talked about diet to talk about exercises on the regular basis which should help in this situation. 3. Dyspnea on exertion that happens while she walks.  Doing echocardiogram would be reasonable.  Overall she is incredibly lady in good shape when she is right she exercised well.  I suspect that extrasystole will be fairly benign.   Medication Adjustments/Labs and Tests Ordered: Current medicines are reviewed at length with the patient today.  Concerns regarding medicines are outlined above.  No orders of the defined types were placed in this encounter.  No orders of the defined types were placed in this encounter.   Signed, Park Liter, MD, [facility]. 05/12/2017 2:02 PM    [place]

## 2017-05-12 NOTE — Patient Instructions (Signed)
Medication Instructions:  Your physician recommends that you continue on your current medications as directed. Please refer to the Current Medication list given to you today.   Labwork: None  Testing/Procedures: Your physician has requested that you have an echocardiogram. Echocardiography is a painless test that uses sound waves to create images of your heart. It provides your doctor with information about the size and shape of your heart and how well your heart's chambers and valves are working. This procedure takes approximately one hour. There are no restrictions for this procedure.  Your physician has recommended that you wear a holter monitor. Holter monitors are medical devices that record the heart's electrical activity. Doctors most often use these monitors to diagnose arrhythmias. Arrhythmias are problems with the speed or rhythm of the heartbeat. The monitor is a small, portable device. You can wear one while you do your normal daily activities. This is usually used to diagnose what is causing palpitations/syncope (passing out). Wear for 48 hours.  Follow-Up: Your physician recommends that you schedule a follow-up appointment in: 1 month.  Any Other Special Instructions Will Be Listed Below (If Applicable).     If you need a refill on your cardiac medications before your next appointment, please call your pharmacy.

## 2017-06-09 ENCOUNTER — Ambulatory Visit (HOSPITAL_BASED_OUTPATIENT_CLINIC_OR_DEPARTMENT_OTHER)
Admission: RE | Admit: 2017-06-09 | Discharge: 2017-06-09 | Disposition: A | Discharge status: Home / Self Care | Payer: Medicare Other | Source: Ambulatory Visit | Attending: Cardiology | Admitting: Cardiology

## 2017-06-09 ENCOUNTER — Ambulatory Visit: Payer: Medicare Other

## 2017-06-09 DIAGNOSIS — R0609 Other forms of dyspnea: Secondary | ICD-10-CM | POA: Diagnosis not present

## 2017-06-09 DIAGNOSIS — R002 Palpitations: Secondary | ICD-10-CM | POA: Insufficient documentation

## 2017-06-09 NOTE — Progress Notes (Signed)
  Echocardiogram 2D Echocardiogram has been performed.  Mackinze Criado T Tevon Berhane 06/09/2017, 9:45 AM

## 2017-06-10 ENCOUNTER — Encounter (INDEPENDENT_AMBULATORY_CARE_PROVIDER_SITE_OTHER): Payer: Self-pay

## 2017-06-17 ENCOUNTER — Ambulatory Visit: Payer: Medicare Other | Admitting: Cardiology

## 2017-06-17 ENCOUNTER — Encounter: Payer: Self-pay | Admitting: Cardiology

## 2017-06-17 VITALS — BP 100/75 | HR 75 | Ht 68.5 in | Wt 157.8 lb

## 2017-06-17 DIAGNOSIS — R0609 Other forms of dyspnea: Secondary | ICD-10-CM

## 2017-06-17 DIAGNOSIS — I471 Supraventricular tachycardia: Secondary | ICD-10-CM | POA: Diagnosis not present

## 2017-06-17 DIAGNOSIS — R002 Palpitations: Secondary | ICD-10-CM

## 2017-06-17 DIAGNOSIS — Z8679 Personal history of other diseases of the circulatory system: Secondary | ICD-10-CM | POA: Insufficient documentation

## 2017-06-17 MED ORDER — METOPROLOL TARTRATE 25 MG PO TABS: 12.5000 mg | ORAL_TABLET | Freq: Two times a day (BID) | ORAL | 6 refills | Status: DC

## 2017-06-17 NOTE — Patient Instructions (Signed)
Medication Instructions:  Your physician has recommended you make the following change in your medication:  START metoprolol 12.5 mg twice daily  Labwork: None  Testing/Procedures: None  Follow-Up: Your physician wants you to follow-up in: 3 months. You will receive a reminder letter in the mail two months in advance. If you don't receive a letter, please call our office to schedule the follow-up appointment.   If you need a refill on your cardiac medications before your next appointment, please call your pharmacy.   Thank you for choosing [facility]! Robyne Peers, RN [phone]

## 2017-06-17 NOTE — Addendum Note (Signed)
Addended by: Austin Miles on: 06/17/2017 03:43 PM   Modules accepted: Orders

## 2017-06-17 NOTE — Progress Notes (Signed)
Cardiology Office Note:    Date:  06/17/2017   ID:  Shannon [name], DOB [birth year], MRN [id number]  PCP:  Abner Greenspan, MD  Cardiologist:  Jenne Campus, MD    Referring MD: Abner Greenspan, MD   Chief Complaint  Patient presents with  . 1 month follow up  Still have some palpitations  History of Present Illness:    Shannon [name] is a 67 y.o. female who was referred to [name] for palpitations.  She wear Holter monitor Holter monitor reveals APCs PVCs only mild in frequency.  She did have few runs of short lasting supraventricular tachycardia also 1 episodes of accelerated ventricular rhythm that happened on 4:15 the morning.  She is doing great otherwise walk very active at this 10,000 steps every single day echocardiogram was normal.  Past Medical History:  Diagnosis Date  . Blood transfusion without reported diagnosis   . Chronic neck and back pain    received physical therapy  . GERD (gastroesophageal reflux disease)    with esophagitis  . Numbness    Right outer thigh, constant, notices more with standing for a long period of time  . Plantar fasciitis    left    Past Surgical History:  Procedure Laterality Date  . BLADDER SUSPENSION N/A 07/04/2015   Procedure: TRANSVAGINAL TAPE (TVT) SLING                   ;  Surgeon: Emily Filbert, MD;  Location: [facility];  Service: Gynecology;  Laterality: N/A;  . COLONOSCOPY    . CYSTOCELE REPAIR N/A 07/04/2015   Procedure: ANTERIOR REPAIR (CYSTOCELE);  Surgeon: Emily Filbert, MD;  Location: [facility];  Service: Gynecology;  Laterality: N/A;  . CYSTOSCOPY N/A 07/04/2015   Procedure: CYSTOSCOPY;  Surgeon: Emily Filbert, MD;  Location: [facility];  Service: Gynecology;  Laterality: N/A;  . DILATION AND CURETTAGE OF UTERUS    . POLYPECTOMY    . TUBAL LIGATION  1987  . UPPER GI ENDOSCOPY    . VAGINAL HYSTERECTOMY N/A 07/04/2015   Procedure: TOTAL VAGINAL HYSTERECTOMY  ;  Surgeon: Emily Filbert, MD;  Location: [facility];  Service: Gynecology;   Laterality: N/A;    Current Medications: Current Meds  Medication Sig  . D-MANNOSE PO Take by mouth 2 (two) times daily.  Marland Kitchen ESTROGENS, CONJUGATED VA Place vaginally 3 (three) times a week.  . famotidine (PEPCID) 20 MG tablet Take 20 mg by mouth daily as needed for heartburn or indigestion.  . Lactobacillus ([place] PRO-B PO) Take by mouth daily.  . mirabegron ER (MYRBETRIQ) 25 MG TB24 tablet Take 1 tablet (25 mg total) by mouth daily.  . Multiple Vitamin (MULTIVITAMIN) tablet Take 1 tablet by mouth daily.       Allergies:   Neomycin-bacitracin zn-polymyx   Social History   Socioeconomic History  . Marital status: Married    Spouse name: Not on file  . Number of children: Not on file  . Years of education: Not on file  . Highest education level: Not on file  Occupational History  . Not on file  Social Needs  . Financial resource strain: Not on file  . Food insecurity:    Worry: Not on file    Inability: Not on file  . Transportation needs:    Medical: Not on file    Non-medical: Not on file  Tobacco Use  . Smoking status: Never Smoker  . Smokeless tobacco: Never Used  Substance and Sexual Activity  . Alcohol use: No    Alcohol/week: 0.0 oz    Comment: rare  . Drug use: No  . Sexual activity: Yes    Birth control/protection: Post-menopausal, Surgical  Lifestyle  . Physical activity:    Days per week: Not on file    Minutes per session: Not on file  . Stress: Not on file  Relationships  . Social connections:    Talks on phone: Not on file    Gets together: Not on file    Attends religious service: Not on file    Active member of club or organization: Not on file    Attends meetings of clubs or organizations: Not on file    Relationship status: Not on file  Other Topics Concern  . Not on file  Social History Narrative  . Not on file     Family History: The patient's family history includes Depression in her mother; Heart disease in her mother; Prostate  cancer in her brother; Stomach cancer in her paternal uncle; Stroke in her mother. There is no history of Colon cancer, Esophageal cancer, Kidney cancer, or Bladder Cancer. ROS:   Please see the history of present illness.    All 14 point review of systems negative except as described per history of present illness  EKGs/Labs/Other Studies Reviewed:      Recent Labs: 12/16/2016: ALT 17; BUN 27; Creatinine, Ser 0.82; Hemoglobin 13.4; Platelets 258.0; Potassium 4.4; Sodium 139; TSH 0.70  Recent Lipid Panel    Component Value Date/Time   CHOL 147 12/16/2016 1409   TRIG 53.0 12/16/2016 1409   HDL 68.60 12/16/2016 1409   CHOLHDL 2 12/16/2016 1409   VLDL 10.6 12/16/2016 1409   LDLCALC 68 12/16/2016 1409    Physical Exam:    VS:  BP 100/75   Pulse 75   Ht 5' 8.5" (1.74 m)   Wt 157 lb 12.8 oz (71.6 kg)   SpO2 98%   BMI 23.64 kg/m     Wt Readings from Last 3 Encounters:  06/17/17 157 lb 12.8 oz (71.6 kg)  05/12/17 156 lb 6.4 oz (70.9 kg)  05/05/17 156 lb 8 oz (71 kg)     GEN:  Well nourished, well developed in no acute distress HEENT: Normal NECK: No JVD; No carotid bruits LYMPHATICS: No lymphadenopathy CARDIAC: RRR, no murmurs, no rubs, no gallops RESPIRATORY:  Clear to auscultation without rales, wheezing or rhonchi  ABDOMEN: Soft, non-tender, non-distended MUSCULOSKELETAL:  No edema; No deformity  SKIN: Warm and dry LOWER EXTREMITIES: no swelling NEUROLOGIC:  Alert and oriented x 3 PSYCHIATRIC:  Normal affect   ASSESSMENT:    1. Dyspnea on exertion   2. PSVT (paroxysmal supraventricular tachycardia) (HCC)   3. Palpitations    PLAN:    In order of problems listed above:  1. Paroxysmal supraventricular tachycardia with extrasystole I think it works to try a small dose of beta-blocker I will put her on 12.5 metoprolol twice daily.  One her about potential side effect and told her if she will not be able to tolerate metoprolol or some other medications that we can  try.  If she will not be controlled with metoprolol then flecainide may be considered.  Overall I think we dealing with very benign condition 2. Is been exertion: Echocardiogram showed preserved left ventricular ejection fraction we will continue monitoring 3. Palpitations: As above    Medication Adjustments/Labs and Tests Ordered: Current medicines are reviewed at length with the  patient today.  Concerns regarding medicines are outlined above.  No orders of the defined types were placed in this encounter.  Medication changes: No orders of the defined types were placed in this encounter.   Signed, Park Liter, MD, [facility] 06/17/2017 3:33 PM    [place]

## 2017-06-23 ENCOUNTER — Ambulatory Visit
Admission: RE | Admit: 2017-06-23 | Discharge: 2017-06-23 | Disposition: A | Discharge status: Home / Self Care | Payer: Medicare Other | Source: Ambulatory Visit | Attending: Family Medicine | Admitting: Family Medicine

## 2017-06-23 DIAGNOSIS — N6001 Solitary cyst of right breast: Secondary | ICD-10-CM

## 2017-10-07 ENCOUNTER — Other Ambulatory Visit: Payer: Self-pay

## 2017-10-07 NOTE — Telephone Encounter (Signed)
-----   Message from Blanchie Dessert, [place] sent at 10/06/2017  1:48 PM EDT ----- Regarding: refill for hormone cream Please send refill for hormone cream to [facility]

## 2017-10-07 NOTE — Telephone Encounter (Signed)
Patient called in requesting medication refill on Estrogens Conjugated VA be electronically sent to [place].  Patient's LOV: 04/25/17 - Annual

## 2017-10-08 MED ORDER — ESTROGENS, CONJUGATED 0.625 MG/GM VA CREA: TOPICAL_CREAM | VAGINAL | 12 refills | Status: DC

## 2017-10-22 ENCOUNTER — Encounter: Payer: Self-pay | Admitting: Urology

## 2017-10-22 ENCOUNTER — Ambulatory Visit: Payer: Medicare Other | Admitting: Urology

## 2017-10-22 VITALS — BP 105/66 | HR 65 | Ht 69.0 in | Wt 157.2 lb

## 2017-10-22 DIAGNOSIS — N952 Postmenopausal atrophic vaginitis: Secondary | ICD-10-CM | POA: Diagnosis not present

## 2017-10-22 DIAGNOSIS — Z87440 Personal history of urinary (tract) infections: Secondary | ICD-10-CM

## 2017-10-22 DIAGNOSIS — R3915 Urgency of urination: Secondary | ICD-10-CM

## 2017-10-22 NOTE — Progress Notes (Signed)
10/22/2017 9:45 AM   Shannon [name] Jun 03, 1950 [id number]  Referring provider: Abner Greenspan, MD [address] [place], [place] [address]  Chief Complaint  Patient presents with  . Follow-up    HPI: 67 y/o WF with a personal history of recurrent UTI's, vaginal atrophy and urgency who presents today for a follow up.    Personal hx of rUTI's Risk factors: age, vaginal atrophy, constipation and incontinence.  Vaginal atrophy Using cream three nights weekly.    Urgency She is experiencing urgency x 0-3 (stable), frequency x 8 or more (stable), not restricting fluids to avoid visits to the restroom, is engaging in toilet mapping, incontinence x 8 or more (worsening) and nocturia x 0-3 (stable).  She denies any gross hematuria, dysuria or suprapubic pain. She is not any fevers, chills, nausea or vomiting. Her PVR today is 0 mL.  Her blood pressure is 105/66.  She feels the Myrbetriq 25 mg is helpful, but she still experiencing some urgency and incontinence.   PMH: Past Medical History:  Diagnosis Date  . Blood transfusion without reported diagnosis   . Chronic neck and back pain    received physical therapy  . GERD (gastroesophageal reflux disease)    with esophagitis  . Numbness    Right outer thigh, constant, notices more with standing for a long period of time  . Plantar fasciitis    left    Surgical History: Past Surgical History:  Procedure Laterality Date  . BLADDER SUSPENSION N/A 07/04/2015   Procedure: TRANSVAGINAL TAPE (TVT) SLING                   ;  Surgeon: Emily Filbert, MD;  Location: [facility];  Service: Gynecology;  Laterality: N/A;  . COLONOSCOPY    . CYSTOCELE REPAIR N/A 07/04/2015   Procedure: ANTERIOR REPAIR (CYSTOCELE);  Surgeon: Emily Filbert, MD;  Location: [facility];  Service: Gynecology;  Laterality: N/A;  . CYSTOSCOPY N/A 07/04/2015   Procedure: CYSTOSCOPY;  Surgeon: Emily Filbert, MD;  Location: [facility];  Service: Gynecology;  Laterality: N/A;  .  DILATION AND CURETTAGE OF UTERUS    . POLYPECTOMY    . TUBAL LIGATION  1987  . UPPER GI ENDOSCOPY    . VAGINAL HYSTERECTOMY N/A 07/04/2015   Procedure: TOTAL VAGINAL HYSTERECTOMY  ;  Surgeon: Emily Filbert, MD;  Location: [facility];  Service: Gynecology;  Laterality: N/A;    Home Medications:  Allergies as of 10/22/2017      Reactions   Neomycin-bacitracin Zn-polymyx Rash   Ointment      Medication List        Accurate as of 10/22/17  9:45 AM. Always use your most recent med list.          conjugated estrogens vaginal cream Commonly known as:  PREMARIN Place vaginally 3 (three) times a week.   D-MANNOSE PO Take by mouth 2 (two) times daily.   famotidine 20 MG tablet Commonly known as:  PEPCID Take 20 mg by mouth daily as needed for heartburn or indigestion.   metoprolol tartrate 25 MG tablet Commonly known as:  LOPRESSOR Take 0.5 tablets (12.5 mg total) by mouth 2 (two) times daily.   mirabegron ER 25 MG Tb24 tablet Commonly known as:  MYRBETRIQ Take 1 tablet (25 mg total) by mouth daily.   multivitamin tablet Take 1 tablet by mouth daily.   [place] PRO-B PO Take by mouth daily.   [address]  Wort 300 MG Tabs Take by mouth.       Allergies:  Allergies  Allergen Reactions  . Neomycin-Bacitracin Zn-Polymyx Rash    Ointment    Family History: Family History  Problem Relation Age of Onset  . Stroke Mother        x 2  . Heart disease Mother        congenital arrhythmia and CHF  . Depression Mother   . Prostate cancer Brother   . Stomach cancer Paternal Uncle   . Colon cancer Neg Hx   . Esophageal cancer Neg Hx   . Kidney cancer Neg Hx   . Bladder Cancer Neg Hx     Social History:  reports that she has never smoked. She has never used smokeless tobacco. She reports that she does not drink alcohol or use drugs.  ROS: UROLOGY Frequent Urination?: Yes Hard to postpone urination?: Yes Burning/pain with urination?: No Get up at night to urinate?:  Yes Leakage of urine?: Yes Urine stream starts and stops?: No Trouble starting stream?: No Do you have to strain to urinate?: No Blood in urine?: No Urinary tract infection?: No Sexually transmitted disease?: No Injury to kidneys or bladder?: No Painful intercourse?: No Weak stream?: No Currently pregnant?: No Vaginal bleeding?: No Last menstrual period?: n  Gastrointestinal Nausea?: No Vomiting?: No Indigestion/heartburn?: No Diarrhea?: No Constipation?: No  Constitutional Fever: No Night sweats?: No Weight loss?: No Fatigue?: No  Skin Skin rash/lesions?: No Itching?: No  Eyes Blurred vision?: No Double vision?: No  Ears/Nose/Throat Sore throat?: No Sinus problems?: No  Hematologic/Lymphatic Swollen glands?: No Easy bruising?: No  Cardiovascular Leg swelling?: No Chest pain?: No  Respiratory Cough?: No Shortness of breath?: No  Endocrine Excessive thirst?: No  Musculoskeletal Back pain?: No Joint pain?: No  Neurological Headaches?: No Dizziness?: No  Psychologic Depression?: No Anxiety?: No  Physical Exam: BP 105/66 (BP Location: Left Arm, Patient Position: Sitting, Cuff Size: Normal)   Pulse 65   Ht 5\' 9"  (1.753 m)   Wt 157 lb 3.2 oz (71.3 kg)   BMI 23.21 kg/m   Constitutional: Well nourished. Alert and oriented, No acute distress. HEENT: [place] AT, moist mucus membranes. Trachea midline, no masses. Cardiovascular: No clubbing, cyanosis, or edema. Respiratory: Normal respiratory effort, no increased work of breathing. GI: Abdomen is soft, non tender, non distended, no abdominal masses. Liver and spleen not palpable.  No hernias appreciated.  Stool sample for occult testing is not indicated.   GU: No CVA tenderness.  No bladder fullness or masses.  Atrophic external genitalia, normal pubic hair distribution, no lesions.  Normal urethral meatus, no lesions, no prolapse, no discharge.   No urethral masses, tenderness and/or tenderness. No  bladder fullness, tenderness or masses. Pale vagina mucosa, poor estrogen effect, no discharge, no lesions, fair pelvic support, grade II cystocele, rectocele noted and pessary in place.   Cervix and uterus surgically absent.  No adnexal/parametria masses or tenderness noted.  Anus and perineum are without rashes or lesions.    Skin: No rashes, bruises or suspicious lesions. Lymph: No cervical or inguinal adenopathy. Neurologic: Grossly intact, no focal deficits, moving all 4 extremities. Psychiatric: Normal mood and affect.  Laboratory Data: Lab Results  Component Value Date   WBC 4.9 12/16/2016   HGB 13.4 12/16/2016   HCT 40.8 12/16/2016   MCV 92.4 12/16/2016   PLT 258.0 12/16/2016    Lab Results  Component Value Date   CREATININE 0.82 12/16/2016    Lab  Results  Component Value Date   TSH 0.70 12/16/2016       Component Value Date/Time   CHOL 147 12/16/2016 1409   HDL 68.60 12/16/2016 1409   CHOLHDL 2 12/16/2016 1409   VLDL 10.6 12/16/2016 1409   LDLCALC 68 12/16/2016 1409    Lab Results  Component Value Date   AST 21 12/16/2016   Lab Results  Component Value Date   ALT 17 12/16/2016   I have reviewed the labs  Pertinent Imaging: Results for FANI, ROTONDO (MRN [id number]) as of 10/22/2017 09:38  Ref. Range 10/21/2016 09:15  Scan Result Unknown 0     I have independently reviewed the films.    Assessment & Plan:    1. History of recurrent UTI's Currently asymptomatic No urinary tract infections since her last visit Taking probiotics and using vaginal estrogen cream   2. Vaginal atrophy she is on a estrogen cream three nights weekly RTC 12 months for exam  3. OAB/Urgency Increase the Myrbetriq to 50 mg daily; samples given -patient will call if she desires a prescription for the 50 mg dosing RTC in one year for OAB questionnaire and PVR                                             Return in about 1 year (around 10/23/2018) for OAB questionnaire,  PVR and exam.  These notes generated with voice recognition software. I apologize for typographical errors.  Zara Council, PA-C  [facility] [address] [place], [place] [address] [phone]

## 2017-11-14 ENCOUNTER — Other Ambulatory Visit: Payer: Self-pay | Admitting: Family Medicine

## 2017-11-14 DIAGNOSIS — Z1231 Encounter for screening mammogram for malignant neoplasm of breast: Secondary | ICD-10-CM

## 2017-11-17 ENCOUNTER — Telehealth: Payer: Self-pay | Admitting: Urology

## 2017-11-17 ENCOUNTER — Other Ambulatory Visit: Payer: Self-pay | Admitting: Family Medicine

## 2017-11-17 MED ORDER — MIRABEGRON ER 50 MG PO TB24: 50.0000 mg | ORAL_TABLET | Freq: Every day | ORAL | 11 refills | Status: DC

## 2017-11-17 NOTE — Telephone Encounter (Signed)
RX sent to pharmacy  

## 2017-11-17 NOTE — Telephone Encounter (Signed)
Pt was advised to call office of efficacy of medication samples of Mybetriq 50mg , pt states this is working for her and would like a Rx sent to her pharmacy, [facility]. Please advise. Thanks.

## 2017-11-20 ENCOUNTER — Telehealth: Payer: Self-pay | Admitting: Urology

## 2017-11-20 MED ORDER — MIRABEGRON ER 50 MG PO TB24: 50.0000 mg | ORAL_TABLET | Freq: Every day | ORAL | 11 refills | Status: DC

## 2017-11-20 NOTE — Addendum Note (Signed)
Addended by: Kyra Manges on: 11/20/2017 03:17 PM   Modules accepted: Orders

## 2017-11-20 NOTE — Telephone Encounter (Signed)
LMOM and informed her that RX was updated to 90 day and sent to pharmacy.

## 2017-11-20 NOTE — Telephone Encounter (Signed)
Pt called office asking if her Rx can be changed to a 90 day supply instead of 1 month. States it's cheaper as a 90 day supply with refills.  Please advise. Thanks.

## 2017-12-01 ENCOUNTER — Encounter

## 2017-12-01 ENCOUNTER — Ambulatory Visit: Payer: Self-pay | Admitting: Podiatry

## 2017-12-23 ENCOUNTER — Telehealth: Payer: Self-pay | Admitting: Family Medicine

## 2017-12-23 DIAGNOSIS — Z0000 Encounter for general adult medical examination without abnormal findings: Secondary | ICD-10-CM

## 2017-12-23 DIAGNOSIS — R739 Hyperglycemia, unspecified: Secondary | ICD-10-CM

## 2017-12-23 NOTE — Telephone Encounter (Signed)
-----   Message from Eustace Pen, LPN sent at 01/13/8249  3:24 PM EST ----- Regarding: Labs 12/4 Lab orders needed. Thank you.  Insurance:  [facility]

## 2017-12-24 ENCOUNTER — Ambulatory Visit (INDEPENDENT_AMBULATORY_CARE_PROVIDER_SITE_OTHER): Payer: Medicare Other

## 2017-12-24 ENCOUNTER — Ambulatory Visit: Payer: Medicare Other

## 2017-12-24 VITALS — BP 118/80 | HR 55 | Temp 97.8°F | Ht 68.75 in | Wt 161.5 lb

## 2017-12-24 DIAGNOSIS — R739 Hyperglycemia, unspecified: Secondary | ICD-10-CM

## 2017-12-24 DIAGNOSIS — Z0000 Encounter for general adult medical examination without abnormal findings: Secondary | ICD-10-CM | POA: Diagnosis not present

## 2017-12-24 LAB — CBC WITH DIFFERENTIAL/PLATELET
Basophils Absolute: 0 10*3/uL (ref 0.0–0.1)
Basophils Relative: 0.8 % (ref 0.0–3.0)
Eosinophils Absolute: 0.1 10*3/uL (ref 0.0–0.7)
Eosinophils Relative: 2.4 % (ref 0.0–5.0)
HEMATOCRIT: 40.7 % (ref 36.0–46.0)
Hemoglobin: 13.5 g/dL (ref 12.0–15.0)
Lymphocytes Relative: 30.1 % (ref 12.0–46.0)
Lymphs Abs: 1.5 10*3/uL (ref 0.7–4.0)
MCHC: 33.2 g/dL (ref 30.0–36.0)
MCV: 91.1 fl (ref 78.0–100.0)
Monocytes Absolute: 0.5 10*3/uL (ref 0.1–1.0)
Monocytes Relative: 9.5 % (ref 3.0–12.0)
Neutro Abs: 2.8 10*3/uL (ref 1.4–7.7)
Neutrophils Relative %: 57.2 % (ref 43.0–77.0)
Platelets: 254 10*3/uL (ref 150.0–400.0)
RBC: 4.47 Mil/uL (ref 3.87–5.11)
RDW: 13.9 % (ref 11.5–15.5)
WBC: 4.9 10*3/uL (ref 4.0–10.5)

## 2017-12-24 LAB — COMPREHENSIVE METABOLIC PANEL
ALBUMIN: 4 g/dL (ref 3.5–5.2)
ALT: 19 U/L (ref 0–35)
AST: 18 U/L (ref 0–37)
Alkaline Phosphatase: 58 U/L (ref 39–117)
BUN: 26 mg/dL — AB (ref 6–23)
CALCIUM: 9.3 mg/dL (ref 8.4–10.5)
CHLORIDE: 103 meq/L (ref 96–112)
CO2: 30 mEq/L (ref 19–32)
CREATININE: 0.69 mg/dL (ref 0.40–1.20)
GFR: 89.55 mL/min (ref >60.00)
Glucose, Bld: 114 mg/dL — ABNORMAL HIGH (ref 70–99)
Potassium: 4.3 mEq/L (ref 3.5–5.1)
SODIUM: 139 meq/L (ref 135–145)
TOTAL PROTEIN: 6.9 g/dL (ref 6.0–8.3)
Total Bilirubin: 0.9 mg/dL (ref 0.2–1.2)

## 2017-12-24 LAB — LIPID PANEL
CHOL/HDL RATIO: 2
Cholesterol: 158 mg/dL (ref 0–200)
HDL: 72.9 mg/dL (ref >39.00)
LDL Cholesterol: 75 mg/dL (ref 0–99)
NonHDL: 84.67
TRIGLYCERIDES: 50 mg/dL (ref 0.0–149.0)
VLDL: 10 mg/dL (ref 0.0–40.0)

## 2017-12-24 LAB — TSH: TSH: 0.69 u[IU]/mL (ref 0.35–4.50)

## 2017-12-24 LAB — HEMOGLOBIN A1C: Hgb A1c MFr Bld: 6.1 % (ref 4.6–6.5)

## 2017-12-24 NOTE — Progress Notes (Signed)
Subjective:   Shannon [name] is a 67 y.o. female who presents for Medicare Annual (Subsequent) preventive examination.  Review of Systems:  N/A Cardiac Risk Factors include: advanced age (>82men, >42 women)     Objective:     Vitals: BP 118/80 (BP Location: Right Arm, Patient Position: Sitting, Cuff Size: Normal)   Pulse (!) 55   Temp 97.8 F (36.6 C) (Oral)   Ht 5' 8.75" (1.746 m) Comment: shoes  Wt 161 lb 8 oz (73.3 kg)   SpO2 99%   BMI 24.02 kg/m   Body mass index is 24.02 kg/m.  Advanced Directives 12/24/2017 12/16/2016 12/08/2016 06/18/2016 11/24/2015 07/04/2015 07/04/2015  Does Patient Have a Medical Advance Directive? Yes Yes No Yes Yes Yes -  Type of Paramedic of [place];Living will [place];Living will - Living will;Healthcare Power of [place];Living will [place];Living will -  Does patient want to make changes to medical advance directive? - - - - No - Patient declined No - Patient declined -  Copy of [place] in Chart? No - copy requested No - copy requested - - Yes - Yes  Would patient like information on creating a medical advance directive? - - No - Patient declined - - - -    Tobacco Social History   Tobacco Use  Smoking Status Never Smoker  Smokeless Tobacco Never Used     Counseling given: No   Clinical Intake:  Pre-visit preparation completed: Yes  Pain : 0-10 Pain Score: 2  Pain Type: Chronic pain Pain Location: Shoulder Pain Orientation: Right Pain Onset: More than a month ago Pain Frequency: Constant     Nutritional Status: BMI 25 -29 Overweight Nutritional Risks: None Diabetes: No  How often do you need to have someone help you when you read instructions, pamphlets, or other written materials from your doctor or pharmacy?: 1 - Never What is the last grade level you completed in school?: Master degree (2)  Interpreter  Needed?: No  Comments: pt lives with spouse Information entered by :: LPinson, LPN  Past Medical History:  Diagnosis Date  . Blood transfusion without reported diagnosis   . Chronic neck and back pain    received physical therapy  . GERD (gastroesophageal reflux disease)    with esophagitis  . Numbness    Right outer thigh, constant, notices more with standing for a long period of time  . Plantar fasciitis    left   Past Surgical History:  Procedure Laterality Date  . BLADDER SUSPENSION N/A 07/04/2015   Procedure: TRANSVAGINAL TAPE (TVT) SLING                   ;  Surgeon: Emily Filbert, MD;  Location: [facility];  Service: Gynecology;  Laterality: N/A;  . COLONOSCOPY    . CYSTOCELE REPAIR N/A 07/04/2015   Procedure: ANTERIOR REPAIR (CYSTOCELE);  Surgeon: Emily Filbert, MD;  Location: [facility];  Service: Gynecology;  Laterality: N/A;  . CYSTOSCOPY N/A 07/04/2015   Procedure: CYSTOSCOPY;  Surgeon: Emily Filbert, MD;  Location: [facility];  Service: Gynecology;  Laterality: N/A;  . DILATION AND CURETTAGE OF UTERUS    . POLYPECTOMY    . TUBAL LIGATION  1987  . UPPER GI ENDOSCOPY    . VAGINAL HYSTERECTOMY N/A 07/04/2015   Procedure: TOTAL VAGINAL HYSTERECTOMY  ;  Surgeon: Emily Filbert, MD;  Location: [facility];  Service:  Gynecology;  Laterality: N/A;   Family History  Problem Relation Age of Onset  . Stroke Mother        x 2  . Heart disease Mother        congenital arrhythmia and CHF  . Depression Mother   . Prostate cancer Brother   . Stomach cancer Paternal Uncle   . Colon cancer Neg Hx   . Esophageal cancer Neg Hx   . Kidney cancer Neg Hx   . Bladder Cancer Neg Hx    Social History   Socioeconomic History  . Marital status: Married    Spouse name: Not on file  . Number of children: Not on file  . Years of education: Not on file  . Highest education level: Not on file  Occupational History  . Not on file  Social Needs  . Financial resource strain: Not on file  . Food insecurity:     Worry: Not on file    Inability: Not on file  . Transportation needs:    Medical: Not on file    Non-medical: Not on file  Tobacco Use  . Smoking status: Never Smoker  . Smokeless tobacco: Never Used  Substance and Sexual Activity  . Alcohol use: No    Alcohol/week: 0.0 standard drinks    Comment: rare  . Drug use: No  . Sexual activity: Yes    Birth control/protection: Post-menopausal, Surgical  Lifestyle  . Physical activity:    Days per week: Not on file    Minutes per session: Not on file  . Stress: Not on file  Relationships  . Social connections:    Talks on phone: Not on file    Gets together: Not on file    Attends religious service: Not on file    Active member of club or organization: Not on file    Attends meetings of clubs or organizations: Not on file    Relationship status: Not on file  Other Topics Concern  . Not on file  Social History Narrative  . Not on file    Outpatient Encounter Medications as of 12/24/2017  Medication Sig  . conjugated estrogens (PREMARIN) vaginal cream Place vaginally 3 (three) times a week.  . D-MANNOSE PO Take by mouth 2 (two) times daily.  . famotidine (PEPCID) 20 MG tablet Take 20 mg by mouth daily as needed for heartburn or indigestion.  . Lactobacillus ([place] PRO-B PO) Take by mouth daily.  . mirabegron ER (MYRBETRIQ) 50 MG TB24 tablet Take 1 tablet (50 mg total) by mouth daily.  . Multiple Vitamin (MULTIVITAMIN) tablet Take 1 tablet by mouth daily.    Francella Solian Johns Wort 300 MG TABS Take by mouth.  . [DISCONTINUED] metoprolol tartrate (LOPRESSOR) 25 MG tablet Take 0.5 tablets (12.5 mg total) by mouth 2 (two) times daily. (Patient not taking: Reported on 10/22/2017)   No facility-administered encounter medications on file as of 12/24/2017.     Activities of Daily Living In your present state of health, do you have any difficulty performing the following activities: 12/24/2017  Hearing? N  Vision? N  Difficulty concentrating  or making decisions? N  Walking or climbing stairs? N  Dressing or bathing? N  Doing errands, shopping? N  Preparing Food and eating ? N  Using the Toilet? N  In the past six months, have you accidently leaked urine? N  Do you have problems with loss of bowel control? N  Managing your Medications? N  Managing your  Finances? N  Housekeeping or managing your Housekeeping? N  Some recent data might be hidden    Patient Care Team: Tower, Wynelle Fanny, MD as PCP - General Emily Filbert, MD as Consulting Physician (Obstetrics and Gynecology)    Assessment:   This is a routine wellness examination for [place].   Hearing Screening   125Hz  250Hz  500Hz  1000Hz  2000Hz  3000Hz  4000Hz  6000Hz  8000Hz   Right ear:   40 40 40  40    Left ear:   40 40 40  40    Vision Screening Comments: Vision exam in Summer 2019 with Dr. George Ina   Exercise Activities and Dietary recommendations Current Exercise Habits: Home exercise routine, Type of exercise: strength training/weights;stretching;walking;Other - see comments(elliptical machine), Time (Minutes): 30, Frequency (Times/Week): 2, Weekly Exercise (Minutes/Week): 60, Intensity: Moderate, Exercise limited by: orthopedic condition(s)  Goals    . Increase physical activity     Starting 12/24/2017, I will continue to exercise with personal trainer for 30 minutes twice weekly and to walk for 1/2-1 mile twice weekly.        Fall Risk Fall Risk  12/24/2017 12/25/2016 12/16/2016 11/24/2015 11/30/2014  Falls in the past year? 0 No No Yes No  Comment - - - pt fell while hiking; bruising and lower back pain -  Number falls in past yr: - - - 1 -  Injury with Fall? - - - Yes -  Follow up - - - Falls evaluation completed -   Depression Screen PHQ 2/9 Scores 12/24/2017 12/25/2016 11/24/2015 11/30/2014  PHQ - 2 Score 0 0 0 0  PHQ- 9 Score 0 - - -     Cognitive Function MMSE - Mini Mental State Exam 12/24/2017 12/16/2016 11/24/2015  Orientation to time 5 5 5     Orientation to Place 5 5 5   Registration 3 3 3   Attention/ Calculation 0 0 0  Recall 3 3 3   Language- name 2 objects 0 0 0  Language- repeat 1 1 1   Language- follow 3 step command 3 3 3   Language- read & follow direction 0 0 0  Write a sentence 0 0 0  Copy design 0 0 0  Total score 20 20 20         Immunization History  Administered Date(s) Administered  . Influenza Whole 01/21/2005, 11/10/2006  . Influenza, High Dose Seasonal PF 10/25/2016, 09/30/2017  . Influenza-Unspecified 10/31/2012, 11/21/2014, 10/05/2015  . Pneumococcal Conjugate-13 11/30/2014  . Pneumococcal Polysaccharide-23 11/17/2013  . Td 10/15/2001, 02/09/2010  . Zoster 09/04/2010  . Zoster Recombinat (Shingrix) 05/03/2016    Screening Tests Health Maintenance  Topic Date Due  . MAMMOGRAM  01/07/2018 (Originally 12/20/2017)  . COLONOSCOPY  07/02/2019  . TETANUS/TDAP  02/10/2020  . INFLUENZA VACCINE  Completed  . DEXA SCAN  Completed  . Hepatitis C Screening  Completed  . PNA vac Low Risk Adult  Completed     Plan:    I have personally reviewed, addressed, and noted the following in the patient's chart:  A. Medical and social history B. Use of alcohol, tobacco or illicit drugs  C. Current medications and supplements D. Functional ability and status E.  Nutritional status F.  Physical activity G. Advance directives H. List of other physicians I.  Hospitalizations, surgeries, and ER visits in previous 12 months J.  [place] to include hearing, vision, cognitive, depression L. Referrals and appointments - none  In addition, I have reviewed and discussed with patient certain preventive protocols, quality metrics, and  best practice recommendations. A written personalized care plan for preventive services as well as general preventive health recommendations were provided to patient.  See attached scanned questionnaire for additional information.   Signed,   Lindell Noe, MHA, BS,  LPN Health Coach

## 2017-12-24 NOTE — Progress Notes (Signed)
PCP notes:   Health maintenance:  Mammogram - future appt scheduled  Abnormal screenings:   None  Patient concerns:   None  Nurse concerns:  None  Next PCP appt:   12/29/17 @ 0930  I reviewed health advisor's note, was available for consultation, and agree with documentation and plan. Loura Pardon MD

## 2017-12-24 NOTE — Patient Instructions (Signed)
Shannon [name] , Thank you for taking time to come for your Medicare Wellness Visit. I appreciate your ongoing commitment to your health goals. Please review the following plan we discussed and let me know if I can assist you in the future.   These are the goals we discussed: Goals    . Increase physical activity     Starting 12/24/2017, I will continue to exercise with personal trainer for 30 minutes twice weekly and to walk for 1/2-1 mile twice weekly.        This is a list of the screening recommended for you and due dates:  Health Maintenance  Topic Date Due  . Mammogram  01/07/2018*  . Colon Cancer Screening  07/02/2019  . Tetanus Vaccine  02/10/2020  . Flu Shot  Completed  . DEXA scan (bone density measurement)  Completed  .  Hepatitis C: One time screening is recommended by Center for Disease Control  (CDC) for  adults born from [birth year] through 1967.   Completed  . Pneumonia vaccines  Completed  *Topic was postponed. The date shown is not the original due date.   Preventive Care for Adults  A healthy lifestyle and preventive care can promote health and wellness. Preventive health guidelines for adults include the following key practices.  . A routine yearly physical is a good way to check with your health care provider about your health and preventive screening. It is a chance to share any concerns and updates on your health and to receive a thorough exam.  . Visit your dentist for a routine exam and preventive care every 6 months. Brush your teeth twice a day and floss once a day. Good oral hygiene prevents tooth decay and gum disease.  . The frequency of eye exams is based on your age, health, family medical history, use  of contact lenses, and other factors. Follow your health care provider's recommendations for frequency of eye exams.  . Eat a healthy diet. Foods like vegetables, fruits, whole grains, low-fat dairy products, and lean protein foods contain the nutrients you need  without too many calories. Decrease your intake of foods high in solid fats, added sugars, and salt. Eat the right amount of calories for you. Get information about a proper diet from your health care provider, if necessary.  . Regular physical exercise is one of the most important things you can do for your health. Most adults should get at least 150 minutes of moderate-intensity exercise (any activity that increases your heart rate and causes you to sweat) each week. In addition, most adults need muscle-strengthening exercises on 2 or more days a week.  Silver Sneakers may be a benefit available to you. To determine eligibility, you may visit the website: www.silversneakers.com or contact program at (513)629-9113 Mon-Fri between 8AM-8PM.   . Maintain a healthy weight. The body mass index (BMI) is a screening tool to identify possible weight problems. It provides an estimate of body fat based on height and weight. Your health care provider can find your BMI and can help you achieve or maintain a healthy weight.   For adults 20 years and older: ? A BMI below 18.5 is considered underweight. ? A BMI of 18.5 to 24.9 is normal. ? A BMI of 25 to 29.9 is considered overweight. ? A BMI of 30 and above is considered obese.   . Maintain normal blood lipids and cholesterol levels by exercising and minimizing your intake of saturated fat. Eat a balanced diet with  plenty of fruit and vegetables. Blood tests for lipids and cholesterol should begin at age 61 and be repeated every 5 years. If your lipid or cholesterol levels are high, you are over 50, or you are at high risk for heart disease, you may need your cholesterol levels checked more frequently. Ongoing high lipid and cholesterol levels should be treated with medicines if diet and exercise are not working.  . If you smoke, find out from your health care provider how to quit. If you do not use tobacco, please do not start.  . If you choose to drink  alcohol, please do not consume more than 2 drinks per day. One drink is considered to be 12 ounces (355 mL) of beer, 5 ounces (148 mL) of wine, or 1.5 ounces (44 mL) of liquor.  . If you are 25-70 years old, ask your health care provider if you should take aspirin to prevent strokes.  . Use sunscreen. Apply sunscreen liberally and repeatedly throughout the day. You should seek shade when your shadow is shorter than you. Protect yourself by wearing long sleeves, pants, a wide-brimmed hat, and sunglasses year round, whenever you are outdoors.  . Once a month, do a whole body skin exam, using a mirror to look at the skin on your back. Tell your health care provider of new moles, moles that have irregular borders, moles that are larger than a pencil eraser, or moles that have changed in shape or color.

## 2017-12-29 ENCOUNTER — Ambulatory Visit (INDEPENDENT_AMBULATORY_CARE_PROVIDER_SITE_OTHER): Payer: Medicare Other | Admitting: Family Medicine

## 2017-12-29 ENCOUNTER — Encounter: Payer: Self-pay | Admitting: Family Medicine

## 2017-12-29 VITALS — BP 116/64 | HR 68 | Temp 98.1°F | Ht 68.75 in | Wt 159.5 lb

## 2017-12-29 DIAGNOSIS — Z0000 Encounter for general adult medical examination without abnormal findings: Secondary | ICD-10-CM

## 2017-12-29 DIAGNOSIS — I471 Supraventricular tachycardia: Secondary | ICD-10-CM | POA: Diagnosis not present

## 2017-12-29 DIAGNOSIS — Z1231 Encounter for screening mammogram for malignant neoplasm of breast: Secondary | ICD-10-CM

## 2017-12-29 DIAGNOSIS — L570 Actinic keratosis: Secondary | ICD-10-CM

## 2017-12-29 DIAGNOSIS — R739 Hyperglycemia, unspecified: Secondary | ICD-10-CM | POA: Diagnosis not present

## 2017-12-29 NOTE — Assessment & Plan Note (Addendum)
Pt has been to dermatology-had several AKs tx  Has f/u in April  Will check lesion on R ear (softer and looks more like a papule)

## 2017-12-29 NOTE — Patient Instructions (Signed)
Have dermatology look at the spot on your ear   Get your mammogram as planned   Keep taking good care of yourself  Eat healthy - less processed carbs and more fruits and veggies  Stay active   Labs are stable

## 2017-12-29 NOTE — Assessment & Plan Note (Signed)
Lab Results  Component Value Date   HGBA1C 6.1 12/24/2017   Avoiding diabetes by staying slim and eating well  disc imp of low glycemic diet and wt loss to prevent DM2

## 2017-12-29 NOTE — Assessment & Plan Note (Signed)
Scheduled annual screening mammogram Nl breast exam today  Encouraged monthly self exams   

## 2017-12-29 NOTE — Assessment & Plan Note (Addendum)
.  Reviewed health habits including diet and exercise and skin cancer prevention Reviewed appropriate screening tests for age  Also reviewed health mt list, fam hx and immunization status , as well as social and family history   See HPI Labs reviewed  amw rev - and mammogram scheduled for 12/18 (nl breast exam)  Continue dermatology f/u  Sent for dates of shingrix series given at pharmacy

## 2017-12-29 NOTE — Progress Notes (Signed)
Subjective:    Patient ID: Shannon [name], female    DOB: 12-Jul-1950, 67 y.o.   MRN: [id number]  HPI Here for health maintenance exam and to review chronic medical problems    Doing pretty well overall   Injured her achilles tendon - could not walk like she used to  (old sprained ankle injury)  Then started swimming- that bothered chronic shoulder issues (impingement)  Seeing chiropractor for both problems  Using massage and cold laser /gentle stretching   Has just gotten back to walking  Can do some gentle strength training    Wt Readings from Last 3 Encounters:  12/29/17 159 lb 8 oz (72.3 kg)  12/24/17 161 lb 8 oz (73.3 kg)  10/22/17 157 lb 3.2 oz (71.3 kg)  stable  23.73 kg/m  Trying to take care of herself  Also working on her posture   Had amw on 12/4 Mammogram scheduled for 12/18  Last one 11/18  No other gaps   Colonoscopy 6/18 with 3 y recall   dexa 11/17-normal  No falls or fractures  Takes vit D and ca   shingrix series done 2018   BP Readings from Last 3 Encounters:  12/29/17 116/64  12/24/17 118/80  10/22/17 105/66   Pulse Readings from Last 3 Encounters:  12/29/17 68  12/24/17 (!) 55  10/22/17 65    H/o elevated glucose Lab Results  Component Value Date   HGBA1C 6.1 12/24/2017  does not want to become diabetic  This is stable  Fasting glucose 114  She controls her weight  Also watches sugar and processed carbs and exercises   Cholesterol  Lab Results  Component Value Date   CHOL 158 12/24/2017   CHOL 147 12/16/2016   CHOL 168 11/24/2015   Lab Results  Component Value Date   HDL 72.90 12/24/2017   HDL 68.60 12/16/2016   HDL 85.40 11/24/2015   Lab Results  Component Value Date   LDLCALC 75 12/24/2017   LDLCALC 68 12/16/2016   LDLCALC 74 11/24/2015   Lab Results  Component Value Date   TRIG 50.0 12/24/2017   TRIG 53.0 12/16/2016   TRIG 45.0 11/24/2015   Lab Results  Component Value Date   CHOLHDL 2 12/24/2017   CHOLHDL 2 12/16/2016   CHOLHDL 2 11/24/2015   No results found for: LDLDIRECT Great profile   Lab Results  Component Value Date   CREATININE 0.69 12/24/2017   BUN 26 (H) 12/24/2017   NA 139 12/24/2017   K 4.3 12/24/2017   CL 103 12/24/2017   CO2 30 12/24/2017   Lab Results  Component Value Date   ALT 19 12/24/2017   AST 18 12/24/2017   ALKPHOS 58 12/24/2017   BILITOT 0.9 12/24/2017    Lab Results  Component Value Date   TSH 0.69 12/24/2017    Lab Results  Component Value Date   WBC 4.9 12/24/2017   HGB 13.5 12/24/2017   HCT 40.7 12/24/2017   MCV 91.1 12/24/2017   PLT 254.0 12/24/2017     Patient Active Problem List   Diagnosis Date Noted  . PSVT (paroxysmal supraventricular tachycardia) ([place]) 06/17/2017  . Dyspnea on exertion 05/12/2017  . Palpitations 05/05/2017  . Actinic keratosis 12/25/2016  . Colitis presumed infectious 12/08/2016  . Recurrent UTI 08/23/2016  . Chronic paronychia of finger 07/26/2016  . Estrogen deficiency 12/06/2015  . Elevated blood sugar 12/06/2015  . Third degree uterine prolapse 04/12/2015  . Incontinence of urine in female 04/12/2015  .  Pelvic relaxation due to uterine prolapse 03/27/2015  . Medicare annual wellness visit, initial 11/30/2014  . Routine general medical examination at a health care facility 11/05/2012  . Screening mammogram, encounter for 11/13/2011  . PLANTAR FASCIITIS, LEFT 07/28/2009  . PERSONAL HX COLONIC POLYPS 11/02/2007  . GERD 10/20/2006   Past Medical History:  Diagnosis Date  . Blood transfusion without reported diagnosis   . Chronic neck and back pain    received physical therapy  . GERD (gastroesophageal reflux disease)    with esophagitis  . Numbness    Right outer thigh, constant, notices more with standing for a long period of time  . Plantar fasciitis    left   Past Surgical History:  Procedure Laterality Date  . BLADDER SUSPENSION N/A 07/04/2015   Procedure: TRANSVAGINAL TAPE (TVT) SLING                    ;  Surgeon: Emily Filbert, MD;  Location: [facility];  Service: Gynecology;  Laterality: N/A;  . COLONOSCOPY    . CYSTOCELE REPAIR N/A 07/04/2015   Procedure: ANTERIOR REPAIR (CYSTOCELE);  Surgeon: Emily Filbert, MD;  Location: [facility];  Service: Gynecology;  Laterality: N/A;  . CYSTOSCOPY N/A 07/04/2015   Procedure: CYSTOSCOPY;  Surgeon: Emily Filbert, MD;  Location: [facility];  Service: Gynecology;  Laterality: N/A;  . DILATION AND CURETTAGE OF UTERUS    . POLYPECTOMY    . TUBAL LIGATION  1987  . UPPER GI ENDOSCOPY    . VAGINAL HYSTERECTOMY N/A 07/04/2015   Procedure: TOTAL VAGINAL HYSTERECTOMY  ;  Surgeon: Emily Filbert, MD;  Location: [facility];  Service: Gynecology;  Laterality: N/A;   Social History   Tobacco Use  . Smoking status: Never Smoker  . Smokeless tobacco: Never Used  Substance Use Topics  . Alcohol use: No    Alcohol/week: 0.0 standard drinks    Comment: rare  . Drug use: No   Family History  Problem Relation Age of Onset  . Stroke Mother        x 2  . Heart disease Mother        congenital arrhythmia and CHF  . Depression Mother   . Prostate cancer Brother   . Stomach cancer Paternal Uncle   . Colon cancer Neg Hx   . Esophageal cancer Neg Hx   . Kidney cancer Neg Hx   . Bladder Cancer Neg Hx    Allergies  Allergen Reactions  . Neomycin-Bacitracin Zn-Polymyx Rash    Ointment   Current Outpatient Medications on File Prior to Visit  Medication Sig Dispense Refill  . conjugated estrogens (PREMARIN) vaginal cream Place vaginally 3 (three) times a week. 42.5 g 12  . D-MANNOSE PO Take by mouth 2 (two) times daily.    . famotidine (PEPCID) 20 MG tablet Take 20 mg by mouth daily as needed for heartburn or indigestion.    . Lactobacillus ([place] PRO-B PO) Take by mouth daily.    . mirabegron ER (MYRBETRIQ) 50 MG TB24 tablet Take 1 tablet (50 mg total) by mouth daily. 90 tablet 11  . Multiple Vitamin (MULTIVITAMIN) tablet Take 1 tablet by mouth daily.      Francella Solian  Johns Wort 300 MG TABS Take by mouth.     No current facility-administered medications on file prior to visit.      Review of Systems  Constitutional: Negative for activity change, appetite change, fatigue, fever and unexpected weight change.  HENT: Negative for congestion, ear pain, rhinorrhea, sinus pressure and sore throat.   Eyes: Negative for pain, redness and visual disturbance.  Respiratory: Negative for cough, shortness of breath and wheezing.   Cardiovascular: Negative for chest pain and palpitations.  Gastrointestinal: Negative for abdominal pain, blood in stool, constipation and diarrhea.  Endocrine: Negative for polydipsia and polyuria.  Genitourinary: Negative for dysuria, frequency and urgency.  Musculoskeletal: Positive for arthralgias. Negative for back pain, joint swelling and myalgias.       Problems with achilles tendonitis and R shoulder tendonitis   Skin: Negative for pallor and rash.  Allergic/Immunologic: Negative for environmental allergies.  Neurological: Negative for dizziness, syncope and headaches.  Hematological: Negative for adenopathy. Does not bruise/bleed easily.  Psychiatric/Behavioral: Negative for decreased concentration and dysphoric mood. The patient is not nervous/anxious.        Objective:   Physical Exam  Constitutional: She appears well-developed and well-nourished. No distress.  Well appearing   HENT:  Head: Normocephalic and atraumatic.  Right Ear: External ear normal.  Left Ear: External ear normal.  Mouth/Throat: Oropharynx is clear and moist.  Eyes: Pupils are equal, round, and reactive to light. Conjunctivae and EOM are normal. No scleral icterus.  Neck: Normal range of motion. Neck supple. No JVD present. Carotid bruit is not present. No thyromegaly present.  Cardiovascular: Normal rate, regular rhythm, normal heart sounds and intact distal pulses. Exam reveals no gallop.  Pulmonary/Chest: Effort normal and breath sounds normal. No  stridor. No respiratory distress. She has no wheezes. She has no rales. She exhibits no tenderness. No breast tenderness, discharge or bleeding.  Abdominal: Soft. Bowel sounds are normal. She exhibits no distension, no abdominal bruit and no mass. There is no tenderness.  Genitourinary: No breast tenderness, discharge or bleeding.  Genitourinary Comments: Breast exam: No mass, nodules, thickening, tenderness, bulging, retraction, inflamation, nipple discharge or skin changes noted.  No axillary or clavicular LA.      Musculoskeletal: Normal range of motion. She exhibits no edema or tenderness.  Lymphadenopathy:    She has no cervical adenopathy.  Neurological: She is alert. She has normal reflexes. She displays normal reflexes. No cranial nerve deficit. She exhibits normal muscle tone. Coordination normal.  Skin: Skin is warm and dry. No rash noted. No erythema. No pallor.  Some scattered lentigines  Also papule on R pinna   Psychiatric: She has a normal mood and affect.  Pleasant  Good mood           Assessment & Plan:   Problem List Items Addressed This Visit      Cardiovascular and Mediastinum   PSVT (paroxysmal supraventricular tachycardia) ([facility])    No symptoms recently         Musculoskeletal and Integument   Actinic keratosis    Pt has been to dermatology-had several AKs tx  Has f/u in April  Will check lesion on R ear (softer and looks more like a papule)         Other   Screening mammogram, encounter for    Scheduled annual screening mammogram Nl breast exam today  Encouraged monthly self exams        Routine general medical examination at a health care facility - Primary    .Reviewed health habits including diet and exercise and skin cancer prevention Reviewed appropriate screening tests for age  Also reviewed health mt list, fam hx and immunization status , as well as social and family history   See HPI Labs  reviewed  amw rev - and mammogram scheduled for  12/18 (nl breast exam)  Continue dermatology f/u  Sent for dates of shingrix series given at pharmacy       Elevated blood sugar    Lab Results  Component Value Date   HGBA1C 6.1 12/24/2017   Avoiding diabetes by staying slim and eating well  disc imp of low glycemic diet and wt loss to prevent DM2

## 2017-12-29 NOTE — Assessment & Plan Note (Signed)
No symptoms recently

## 2018-01-07 ENCOUNTER — Ambulatory Visit
Admission: RE | Admit: 2018-01-07 | Discharge: 2018-01-07 | Disposition: A | Discharge status: Home / Self Care | Payer: Medicare Other | Source: Ambulatory Visit | Attending: Family Medicine | Admitting: Family Medicine

## 2018-01-07 DIAGNOSIS — Z1231 Encounter for screening mammogram for malignant neoplasm of breast: Secondary | ICD-10-CM

## 2018-01-25 ENCOUNTER — Encounter: Payer: Self-pay | Admitting: Family Medicine

## 2018-02-03 ENCOUNTER — Encounter: Payer: Self-pay | Admitting: Physical Therapy

## 2018-02-03 ENCOUNTER — Ambulatory Visit: Payer: Medicare Other | Attending: Orthopedic Surgery | Admitting: Physical Therapy

## 2018-02-03 DIAGNOSIS — M25511 Pain in right shoulder: Secondary | ICD-10-CM | POA: Diagnosis not present

## 2018-02-03 DIAGNOSIS — G8929 Other chronic pain: Secondary | ICD-10-CM | POA: Diagnosis present

## 2018-02-03 NOTE — Therapy (Signed)
[facility] [address], [place], [address] Phone: [phone]   Fax:  [phone]  Physical Therapy Evaluation  Patient Details  Name: Shannon [name] MRN: [id number] Date of Birth: 1950-07-23 Referring Provider (PT): Tania Ade MD   Encounter Date: 02/03/2018  PT End of Session - 02/04/18 1018    Visit Number  1    Number of Visits  17    Date for PT Re-Evaluation  03/31/18    PT Start Time  0315    PT Stop Time  0415    PT Time Calculation (min)  60 min    Activity Tolerance  Patient tolerated treatment well    Behavior During Therapy  [facility] for tasks assessed/performed       Past Medical History:  Diagnosis Date  . Blood transfusion without reported diagnosis   . Chronic neck and back pain    received physical therapy  . GERD (gastroesophageal reflux disease)    with esophagitis  . Numbness    Right outer thigh, constant, notices more with standing for a long period of time  . Plantar fasciitis    left    Past Surgical History:  Procedure Laterality Date  . BLADDER SUSPENSION N/A 07/04/2015   Procedure: TRANSVAGINAL TAPE (TVT) SLING                   ;  Surgeon: Emily Filbert, MD;  Location: [facility];  Service: Gynecology;  Laterality: N/A;  . COLONOSCOPY    . CYSTOCELE REPAIR N/A 07/04/2015   Procedure: ANTERIOR REPAIR (CYSTOCELE);  Surgeon: Emily Filbert, MD;  Location: [facility];  Service: Gynecology;  Laterality: N/A;  . CYSTOSCOPY N/A 07/04/2015   Procedure: CYSTOSCOPY;  Surgeon: Emily Filbert, MD;  Location: [facility];  Service: Gynecology;  Laterality: N/A;  . DILATION AND CURETTAGE OF UTERUS    . POLYPECTOMY    . TUBAL LIGATION  1987  . UPPER GI ENDOSCOPY    . VAGINAL HYSTERECTOMY N/A 07/04/2015   Procedure: TOTAL VAGINAL HYSTERECTOMY  ;  Surgeon: Emily Filbert, MD;  Location: [facility];  Service: Gynecology;  Laterality: N/A;    There were no vitals filed for this  visit.       OBJECTIVE  MUSCULOSKELETAL: Tremor: Normal Bulk: Normal Tone: Normal  Cervical Screen AROM: WFL and painless with overpressure in all planes- tension with L lateral bending Spurlings A (ipsilateral lateral flexion/axial compression): neg bilat Spurlings B (ipsilateral lateral flexion/contralateral rotation/axial compression) neg bilat Repeated movement: No centralization or peripheralization with protraction or retraction Hoffman Sign (cervical cord compression): negative bilat ULTT Median: negative bilat ULTT Ulnar: negative bilat ULTT Radial: negative bilat  Elbow Screen Elbow AROM: WNL bilat  Palpation No pain with palpation to anterior, posterior, lateral, and superior shoulder  Strength R/L 4*/5 Shoulder flexion (anterior deltoid/pec major/coracobrachialis, axillary n. (C5-6) and musculocutaneous n. (C5-7)) 4*/5 Shoulder abduction (deltoid/supraspinatus, axillary/suprascapular n, C5) 5*/5 Shoulder external rotation (infraspinatus/teres minor) 5*/5 Shoulder internal rotation (subcapularis/lats/pec major) 5/5 Shoulder extension (posterior deltoid, lats, teres major, axillary/thoracodorsal n.) 5/5 Elbow flexion (biceps brachii, brachialis, brachioradialis, musculoskeletal n, C5-6) 5/5 Elbow extension (triceps, radial n, C7) 5/5 Wrist Extension 5/5 Wrist Flexion 5/5 Finger adduction (interossei, ulnar n, T1)  AROM R/L 145/180 Shoulder flexion 155/180 Shoulder abduction CTJ*/CTJ Shoulder external rotation T12/R scapular border Shoulder internal rotation 60/60 Shoulder extension *Indicates pain, overpressure performed unless otherwise indicated  PROM R/L 165/180 Shoulder flexion 140/180 Shoulder abduction  90/90 Shoulder external rotation 77/77 Shoulder internal rotation 60/60 Shoulder extension *Indicates pain, overpressure performed unless otherwise indicated  Accessory Motions/Glides Glenohumeral: Posterior: Normal bilat with pain with R  PA, that subsides with distraction + mobilization Inferior: normal bilat  NEUROLOGICAL:  Mental Status Patient is oriented to person, place and time.  Recent memory is intact.  Remote memory is intact.  Attention span and concentration are intact.  Expressive speech is intact.  Patient's fund of knowledge is within normal limits for educational level.  Sensation Grossly intact to light touch bilateral UE as determined by testing dermatomes C2-T2 Proprioception and hot/cold testing deferred on this date  Reflexes R/L 1+ b/l Biceps 1+ b/l Brachioradialis 1+ b/l Triceps   SPECIAL TESTS  Rotator Cuff  Drop Arm Test: negative bilat Painful Arc (Pain from 60 to 120 degrees scaption): R postive at 100d Infraspinatus Muscle Test: negative bilat If all 3 tests positive, the probability of a full-thickness rotator cuff tear is 91%  Subacromial Impingement Hawkins-Kennedy: Positive on R Neer (Block scapula, PROM flexion):  Positive on R Painful Arc (Pain from 60 to 120 degrees scaption):  Empty Can: Positive on R External Rotation Resistance Positive on R Horizontal Adduction: negative bilat   Labral Tear Biceps Load II (120 elevation, full ER, 90 elbow flexion, full supination, resisted elbow flexion): negative b/l Crank (160 scaption, axial load with IR/ER): negative b/l Active Compression Test: negative b/l  Bicep Tendon Pathology Speed (shoulder flexion to 90, external rotation, full elbow extension, and forearm supination with resistance:negative b/l Yergason's (resisted shoulder ER and supination/biceps tendon pathology): negative b/l  Shoulder Instability Sulcus Sign:negative b/l Anterior Apprehension:negative b/l  Ther-Ex - Pulleys shoulder flex and abd x10 with 2sec hold and slow lowering; VC needed initially with good carry over following (HEP) - Doorway pec stretch 30sec hold (HEP) - UT stretch 30sec hold (HEP) - Scap retractions x10 with min cuing initially  with good carry over following (HEP) - Education on postural restoration with normalizing length tension relationships of shoulder/neck/tspine                             Objective measurements completed on examination: See above findings.              PT Education - 02/03/18 1538    Education provided  Yes    Education Details  Patient was educated on diagnosis, anatomy and pathology involved, prognosis, role of PT, and was given an HEP, demonstrating exercise with proper form following verbal and tactile cues, and was given a paper hand out to continue exercise at home. Pt was educated on and agreed to plan of care.    Person(s) Educated  Patient    Methods  Explanation;Demonstration;Tactile cues;Verbal cues;Handout    Comprehension  Verbalized understanding;Verbal cues required;Need further instruction;Returned demonstration       PT Short Term Goals - 02/04/18 1053      PT SHORT TERM GOAL #1   Title  Pt will be independent with HEP in order to improve strength and decrease pain in order to improve pain-free function at home and work.    Time  4    Period  Weeks    Status  New        PT Long Term Goals - 02/04/18 1044      PT LONG TERM GOAL #1   Title  Pt will decrease worst pain as reported on NPRS by at least  3 points in order to demonstrate clinically significant reduction in pain.    Baseline  02/03/18 7/10    Time  8    Period  Weeks    Status  New      PT LONG TERM GOAL #2   Title  Patient will demonstrate full active shoulder ROM in order to demonstrate return to PLOF, and to safely complete ADLs    Baseline  02/03/18 see eval    Time  8    Period  Weeks    Status  New      PT LONG TERM GOAL #3   Title  Patient will demonstrate gross shoulder strength of painfree 5/5 to complete overhead ADLs    Baseline  02/03/18 see eval    Time  8    Period  Weeks    Status  New      PT LONG TERM GOAL #4   Title  Patient will increase FOTO  score to 68 to demonstrate predicted increase in functional mobility to complete ADLs    Baseline  02/03/18 58    Time  8    Period  Weeks    Status  New             Plan - 02/04/18 1053    Clinical Impression Statement  Patient is a 68 year old female presenting with R shoulder pain that is chronic in nature with recent increase following increased swimming activity. Patient with impairments in shoulder ROM, soft tissue restrictions, strength, posture, and pain. Patient currently unable to complete overhead and reaching "behind the back" activities, inhibiting full participation in ADLs and playing [place]. Would benefit from skilled PT to address above deficits and promote optimal return to PLOF    Clinical Presentation  Evolving    Clinical Presentation due to:  2 personal factors/comorbidities, 3 or more body systems/activity limitations/participation restrictions    Clinical Decision Making  Moderate    Rehab Potential  Good    PT Frequency  2x / week    PT Duration  8 weeks    PT Treatment/Interventions  Dry needling;Functional mobility training;Therapeutic exercise;Manual techniques;ADLs/Self Care Home Management;Electrical Stimulation;Cryotherapy;Aquatic Therapy;Moist Heat;Traction;Therapeutic activities;Ultrasound;Neuromuscular re-education;Patient/family education;Taping;Joint Manipulations;Spinal Manipulations;Passive range of motion    PT Next Visit Plan  postural retraining, possible TDN    PT Home Exercise Plan  Pulleys flex/abd, doorway pec stretch, UT stretch    Consulted and Agree with Plan of Care  Patient       Patient will benefit from skilled therapeutic intervention in order to improve the following deficits and impairments:  Pain, Improper body mechanics, Postural dysfunction, Decreased range of motion, Difficulty walking, Decreased strength, Decreased activity tolerance, Decreased endurance, Increased fascial restricitons, Impaired UE functional use, Decreased  mobility, Impaired flexibility  Visit Diagnosis: Chronic right shoulder pain     Problem List Patient Active Problem List   Diagnosis Date Noted  . PSVT (paroxysmal supraventricular tachycardia) ([place]) 06/17/2017  . Dyspnea on exertion 05/12/2017  . Palpitations 05/05/2017  . Actinic keratosis 12/25/2016  . Colitis presumed infectious 12/08/2016  . Recurrent UTI 08/23/2016  . Chronic paronychia of finger 07/26/2016  . Estrogen deficiency 12/06/2015  . Elevated blood sugar 12/06/2015  . Third degree uterine prolapse 04/12/2015  . Incontinence of urine in female 04/12/2015  . Pelvic relaxation due to uterine prolapse 03/27/2015  . Medicare annual wellness visit, initial 11/30/2014  . Routine general medical examination at a health care facility 11/05/2012  . Screening mammogram, encounter  for 11/13/2011  . PLANTAR FASCIITIS, LEFT 07/28/2009  . PERSONAL HX COLONIC POLYPS 11/02/2007  . GERD 10/20/2006   Shelton Silvas PT, DPT Shelton Silvas 02/04/2018, 11:04 AM  [facility] [address] [place], [address] Phone: [phone]   Fax:  [phone]  Name: Shannon [name] MRN: [id number] Date of Birth: [birth year]

## 2018-02-04 ENCOUNTER — Encounter: Payer: Self-pay | Admitting: Physical Therapy

## 2018-02-05 ENCOUNTER — Ambulatory Visit: Payer: Medicare Other | Admitting: Physical Therapy

## 2018-02-05 ENCOUNTER — Encounter: Payer: Self-pay | Admitting: Physical Therapy

## 2018-02-05 DIAGNOSIS — G8929 Other chronic pain: Secondary | ICD-10-CM

## 2018-02-05 DIAGNOSIS — M25511 Pain in right shoulder: Principal | ICD-10-CM

## 2018-02-05 NOTE — Therapy (Signed)
[facility] [address] [place], [address] Phone: [phone]   Fax:  [phone]  Physical Therapy Treatment  Patient Details  Name: Shannon [name] MRN: [id number] Date of Birth: 28-Aug-1950 Referring Provider (PT): Tania Ade MD   Encounter Date: 02/05/2018  PT End of Session - 02/05/18 1547    Visit Number  2    Number of Visits  17    Date for PT Re-Evaluation  03/31/18    PT Start Time  0315    PT Stop Time  0400    PT Time Calculation (min)  45 min    Activity Tolerance  Patient tolerated treatment well    Behavior During Therapy  [facility] for tasks assessed/performed       Past Medical History:  Diagnosis Date  . Blood transfusion without reported diagnosis   . Chronic neck and back pain    received physical therapy  . GERD (gastroesophageal reflux disease)    with esophagitis  . Numbness    Right outer thigh, constant, notices more with standing for a long period of time  . Plantar fasciitis    left    Past Surgical History:  Procedure Laterality Date  . BLADDER SUSPENSION N/A 07/04/2015   Procedure: TRANSVAGINAL TAPE (TVT) SLING                   ;  Surgeon: Emily Filbert, MD;  Location: [facility];  Service: Gynecology;  Laterality: N/A;  . COLONOSCOPY    . CYSTOCELE REPAIR N/A 07/04/2015   Procedure: ANTERIOR REPAIR (CYSTOCELE);  Surgeon: Emily Filbert, MD;  Location: [facility];  Service: Gynecology;  Laterality: N/A;  . CYSTOSCOPY N/A 07/04/2015   Procedure: CYSTOSCOPY;  Surgeon: Emily Filbert, MD;  Location: [facility];  Service: Gynecology;  Laterality: N/A;  . DILATION AND CURETTAGE OF UTERUS    . POLYPECTOMY    . TUBAL LIGATION  1987  . UPPER GI ENDOSCOPY    . VAGINAL HYSTERECTOMY N/A 07/04/2015   Procedure: TOTAL VAGINAL HYSTERECTOMY  ;  Surgeon: Emily Filbert, MD;  Location: [facility];  Service: Gynecology;  Laterality: N/A;    There were no vitals filed for this visit.  Subjective  Assessment - 02/05/18 1519    Subjective  Patient reports she has minimal pain today, with muscle tension, with 4/10 pain with UE movement. Patient reports compliance with HEP with no questions or concerns.     Pertinent History  Patient is a 68 year old female reporting with chronic R shoulder pain. Patient reports she began swimming after bout of achielles tendinitis which reduced her ability to walk for exercise. Patient reports following increased swimming in Nov, her pain increased in R shoulder. Patient reports she has not been swimming since, but continues walking for exercises as her tenditinis has cleared. Patient reports she had a cortisone shot Friday 01/30/18 and seeing a chiropractor and reports postural correction from this has helped her R shoulder pain. 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 Patient reports ADLs with overhead and reaching behind back, with increased difficultying with bathing and dressing. Worst pain over the past week 7/10 best 0/10. Patient reports she has difficulty getting to sleep and staying asleep. Patient reports if she rolls over onto the R side her pain awakens her.  Pt denies  N/V, B&B changes, unexplained weight fluctuation, saddle paresthesia, fever, night sweats, or unrelenting night pain at this time.    Limitations  House hold activities;Lifting    How long can you sit comfortably?  unlimited    How long can you stand comfortably?  unlimited    How long can you walk comfortably?  unlimited    Diagnostic tests  Xrays negative; MRI scheduled post PT    Patient Stated Goals  Be able to complete ADLs and play flute without pain     Pain Onset  More than a month ago         Ther-Ex - Standing rows 10# 3x 10 with cuing for proper posture initially with good carry over following - High rows 5# 3x 10 with cuing initially to prevent shoulder hiking with  good carry over - Doorway pec stretch 2x 45sec hold   Manual - STM with trigger point release to R middle deltoid, levator, UT and pec minor Following: Dry Needling: (4) 97mm .30 needles placed along the R UT and R levator to decrease increased muscular spasms and trigger points with the patient positioned in supine, utilizing pincer grasp. Patient was educated on risks and benefits of therapy and verbally consents to PT.  - Cervical traction x10 10sec traction, 10sec relax - GHJ grade III mob with movement into abd and flex 30sec bouts 4 bouts each direction - Bouts of PROM with patient demonstrating full ROM with slight pain with end range abd that subsides with abd with AP mob  Following manual interventions patient demonstrates full, painfree AROM                       PT Education - 02/05/18 1613    Education provided  Yes    Education Details  TDN, exercise technique    Person(s) Educated  Patient    Methods  Explanation;Demonstration;Tactile cues;Verbal cues    Comprehension  Verbalized understanding;Returned demonstration;Verbal cues required;Tactile cues required       PT Short Term Goals - 02/04/18 1053      PT SHORT TERM GOAL #1   Title  Pt will be independent with HEP in order to improve strength and decrease pain in order to improve pain-free function at home and work.    Time  4    Period  Weeks    Status  New        PT Long Term Goals - 02/04/18 1044      PT LONG TERM GOAL #1   Title  Pt will decrease worst pain as reported on NPRS by at least 3 points in order to demonstrate clinically significant reduction in pain.    Baseline  02/03/18 7/10    Time  8    Period  Weeks    Status  New      PT LONG TERM GOAL #2   Title  Patient will demonstrate full active shoulder ROM in order to demonstrate return to PLOF, and to safely complete ADLs    Baseline  02/03/18 see eval    Time  8    Period  Weeks    Status  New      PT LONG TERM GOAL #3    Title  Patient will demonstrate gross shoulder strength of painfree 5/5 to complete overhead ADLs    Baseline  02/03/18 see eval    Time  8    Period  Weeks    Status  New      PT LONG TERM GOAL #4   Title  Patient will increase FOTO score to 68 to demonstrate predicted increase in functional mobility to complete ADLs    Baseline  02/03/18 58    Time  8    Period  Weeks    Status  New            Plan - 02/05/18 1652    Clinical Impression Statement  PT utilized manual + TDN techniques to decrease muscle tension/pain, which patient is able to demonstrate full active and passive shoulder ROM following, and reports minimal pain with overhead motion. PT initiated postural muscle activiation, which patient is able to complete with accuracy following PT cuing.     Clinical Presentation  Evolving    Clinical Decision Making  Moderate    Rehab Potential  Good    PT Frequency  2x / week    PT Duration  8 weeks    PT Treatment/Interventions  Dry needling;Functional mobility training;Therapeutic exercise;Manual techniques;ADLs/Self Care Home Management;Electrical Stimulation;Cryotherapy;Aquatic Therapy;Moist Heat;Traction;Therapeutic activities;Ultrasound;Neuromuscular re-education;Patient/family education;Taping;Joint Manipulations;Spinal Manipulations;Passive range of motion    PT Next Visit Plan  postural retraining, possible TDN    PT Home Exercise Plan  Pulleys flex/abd, doorway pec stretch, UT stretch    Consulted and Agree with Plan of Care  Patient       Patient will benefit from skilled therapeutic intervention in order to improve the following deficits and impairments:  Pain, Improper body mechanics, Postural dysfunction, Decreased range of motion, Difficulty walking, Decreased strength, Decreased activity tolerance, Decreased endurance, Increased fascial restricitons, Impaired UE functional use, Decreased mobility, Impaired flexibility  Visit Diagnosis: Chronic right shoulder  pain     Problem List Patient Active Problem List   Diagnosis Date Noted  . PSVT (paroxysmal supraventricular tachycardia) ([place]) 06/17/2017  . Dyspnea on exertion 05/12/2017  . Palpitations 05/05/2017  . Actinic keratosis 12/25/2016  . Colitis presumed infectious 12/08/2016  . Recurrent UTI 08/23/2016  . Chronic paronychia of finger 07/26/2016  . Estrogen deficiency 12/06/2015  . Elevated blood sugar 12/06/2015  . Third degree uterine prolapse 04/12/2015  . Incontinence of urine in female 04/12/2015  . Pelvic relaxation due to uterine prolapse 03/27/2015  . Medicare annual wellness visit, initial 11/30/2014  . Routine general medical examination at a health care facility 11/05/2012  . Screening mammogram, encounter for 11/13/2011  . PLANTAR FASCIITIS, LEFT 07/28/2009  . PERSONAL HX COLONIC POLYPS 11/02/2007  . GERD 10/20/2006   Shelton Silvas PT, DPT Shelton Silvas 02/05/2018, 4:57 PM  [facility] [address] [place], [address] Phone: [phone]   Fax:  [phone]  Name: Shannon [name] MRN: [id number] Date of Birth: December 14, 1950

## 2018-02-09 ENCOUNTER — Encounter: Payer: Medicare Other | Admitting: Physical Therapy

## 2018-02-10 ENCOUNTER — Encounter: Payer: Self-pay | Admitting: Physical Therapy

## 2018-02-10 ENCOUNTER — Ambulatory Visit: Payer: Medicare Other | Admitting: Physical Therapy

## 2018-02-10 DIAGNOSIS — G8929 Other chronic pain: Secondary | ICD-10-CM

## 2018-02-10 DIAGNOSIS — M25511 Pain in right shoulder: Principal | ICD-10-CM

## 2018-02-10 NOTE — Therapy (Signed)
[facility] [address] [place], [address] Phone: [phone]   Fax:  [phone]  Physical Therapy Treatment  Patient Details  Name: Shannon [name] MRN: [id number] Date of Birth: 09-10-1950 Referring Provider (PT): Tania Ade MD   Encounter Date: 02/10/2018  PT End of Session - 02/10/18 2376    Visit Number  3    Number of Visits  17    Date for PT Re-Evaluation  03/31/18    Activity Tolerance  Patient tolerated treatment well    Behavior During Therapy  [facility] for tasks assessed/performed       Past Medical History:  Diagnosis Date  . Blood transfusion without reported diagnosis   . Chronic neck and back pain    received physical therapy  . GERD (gastroesophageal reflux disease)    with esophagitis  . Numbness    Right outer thigh, constant, notices more with standing for a long period of time  . Plantar fasciitis    left    Past Surgical History:  Procedure Laterality Date  . BLADDER SUSPENSION N/A 07/04/2015   Procedure: TRANSVAGINAL TAPE (TVT) SLING                   ;  Surgeon: Emily Filbert, MD;  Location: [facility];  Service: Gynecology;  Laterality: N/A;  . COLONOSCOPY    . CYSTOCELE REPAIR N/A 07/04/2015   Procedure: ANTERIOR REPAIR (CYSTOCELE);  Surgeon: Emily Filbert, MD;  Location: [facility];  Service: Gynecology;  Laterality: N/A;  . CYSTOSCOPY N/A 07/04/2015   Procedure: CYSTOSCOPY;  Surgeon: Emily Filbert, MD;  Location: [facility];  Service: Gynecology;  Laterality: N/A;  . DILATION AND CURETTAGE OF UTERUS    . POLYPECTOMY    . TUBAL LIGATION  1987  . UPPER GI ENDOSCOPY    . VAGINAL HYSTERECTOMY N/A 07/04/2015   Procedure: TOTAL VAGINAL HYSTERECTOMY  ;  Surgeon: Emily Filbert, MD;  Location: [facility];  Service: Gynecology;  Laterality: N/A;    There were no vitals filed for this visit.  Subjective Assessment - 02/10/18 1437    Subjective  Pt reports minimal pain with a ~1-2/10 on NPS.  Pain is radiating down the right side of the lateral neck to the shoulder near the clavicular region.     Pertinent History  Patient is a 68 year old female reporting with chronic R shoulder pain. Patient reports she began swimming after bout of achielles tendinitis which reduced her ability to walk for exercise. Patient reports following increased swimming in Nov, her pain increased in R shoulder. Patient reports she has not been swimming since, but continues walking for exercises as her tenditinis has cleared. Patient reports she had a cortisone shot Friday 01/30/18 and seeing a chiropractor and reports postural correction from this has helped her R shoulder pain. 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 Patient reports ADLs with overhead and reaching behind back, with increased difficultying with bathing and dressing. Worst pain over the past week 7/10 best 0/10. Patient reports she has difficulty getting to sleep and staying asleep. Patient reports if she rolls over onto the R side her pain awakens her.  Pt denies N/V, B&B changes, unexplained weight fluctuation, saddle paresthesia, fever, night sweats, or unrelenting night pain at this time.    Limitations  House hold  activities;Lifting    How long can you sit comfortably?  unlimited    How long can you stand comfortably?  unlimited    How long can you walk comfortably?  unlimited    Diagnostic tests  Xrays negative; MRI scheduled post PT    Patient Stated Goals  Be able to complete ADLs and play flute without pain     Currently in Pain?  Yes    Pain Score  2     Pain Location  Shoulder    Pain Orientation  Right    Pain Descriptors / Indicators  Aching;Dull;Sharp    Pain Type  Chronic pain    Pain Radiating Towards  R shoulder and neck    Pain Onset  More than a month ago    Pain Frequency  Constant     TREATMENT THERAPEUTIC  EXERCISE  Standing Rows: 1x12, 10 lbs. 2x10, 15 lbs.  Lat pulldowns: 3x12, 20 lbs   Standing pulldown on OMEGA: 3x12, 10 lbs  Standing TB (Red) IR and ER: 3x12 each  Standing DB scaption: 3x12, 2 lbs  Supine DB serratus punch: 3x12, 2 lbs  Manual Therapy - STM withtrigger point releaseto R middle deltoid, levator, UT  Following:Dry Needling: (2)25mm .30needles placed along the R UTto decrease increased muscular spasms and trigger points with the patient positioned in supine, utilizing pincer grasp. Patient was educated on risks and benefits of therapy and verbally consents to PT. - Cervical traction x10 10sec traction, 10sec relax  Full passive ROM and less muscle tension this session, allowing for decreased manual techniques  Ther-Ex to strengthen periscapular musculature to improve overhead motion, strength, and endurance. Manual therapy to decrease pain and muscle spasms.                          PT Education - 02/10/18 1442    Education Details  TDN, form/technique with exercise.    Person(s) Educated  Patient    Methods  Explanation;Demonstration;Tactile cues;Verbal cues    Comprehension  Verbalized understanding;Returned demonstration       PT Short Term Goals - 02/04/18 1053      PT SHORT TERM GOAL #1   Title  Pt will be independent with HEP in order to improve strength and decrease pain in order to improve pain-free function at home and work.    Time  4    Period  Weeks    Status  New        PT Long Term Goals - 02/04/18 1044      PT LONG TERM GOAL #1   Title  Pt will decrease worst pain as reported on NPRS by at least 3 points in order to demonstrate clinically significant reduction in pain.    Baseline  02/03/18 7/10    Time  8    Period  Weeks    Status  New      PT LONG TERM GOAL #2   Title  Patient will demonstrate full active shoulder ROM in order to demonstrate return to PLOF, and to safely complete ADLs    Baseline  02/03/18  see eval    Time  8    Period  Weeks    Status  New      PT LONG TERM GOAL #3   Title  Patient will demonstrate gross shoulder strength of painfree 5/5 to complete overhead ADLs    Baseline  02/03/18 see eval    Time  8    Period  Weeks    Status  New      PT LONG TERM GOAL #4   Title  Patient will increase FOTO score to 68 to demonstrate predicted increase in functional mobility to complete ADLs    Baseline  02/03/18 58    Time  8    Period  Weeks    Status  New            Plan - 02/10/18 1541    Clinical Impression Statement  PT utilized manual and TDN techniques to decrease muscle tension/pain. Pt continues to report minimal pain with overhead motion. Pt performed periscapular strengthening with minimal cueing needed to target correct musculature. Pt can benefit from further skilled treatment to decrease shoulder pain and to improve overhead motion, strength, and endurance.     Clinical Presentation  Evolving    Clinical Decision Making  Moderate    Rehab Potential  Good    PT Frequency  2x / week    PT Duration  8 weeks    PT Treatment/Interventions  Dry needling;Functional mobility training;Therapeutic exercise;Manual techniques;ADLs/Self Care Home Management;Electrical Stimulation;Cryotherapy;Aquatic Therapy;Moist Heat;Traction;Therapeutic activities;Ultrasound;Neuromuscular re-education;Patient/family education;Taping;Joint Manipulations;Spinal Manipulations;Passive range of motion    PT Next Visit Plan  postural retraining, possible TDN    PT Home Exercise Plan  Pulleys flex/abd, doorway pec stretch, UT stretch    Consulted and Agree with Plan of Care  Patient       Patient will benefit from skilled therapeutic intervention in order to improve the following deficits and impairments:  Pain, Improper body mechanics, Postural dysfunction, Decreased range of motion, Difficulty walking, Decreased strength, Decreased activity tolerance, Decreased endurance, Increased fascial  restricitons, Impaired UE functional use, Decreased mobility, Impaired flexibility  Visit Diagnosis: Chronic right shoulder pain     Problem List Patient Active Problem List   Diagnosis Date Noted  . PSVT (paroxysmal supraventricular tachycardia) ([place]) 06/17/2017  . Dyspnea on exertion 05/12/2017  . Palpitations 05/05/2017  . Actinic keratosis 12/25/2016  . Colitis presumed infectious 12/08/2016  . Recurrent UTI 08/23/2016  . Chronic paronychia of finger 07/26/2016  . Estrogen deficiency 12/06/2015  . Elevated blood sugar 12/06/2015  . Third degree uterine prolapse 04/12/2015  . Incontinence of urine in female 04/12/2015  . Pelvic relaxation due to uterine prolapse 03/27/2015  . Medicare annual wellness visit, initial 11/30/2014  . Routine general medical examination at a health care facility 11/05/2012  . Screening mammogram, encounter for 11/13/2011  . PLANTAR FASCIITIS, LEFT 07/28/2009  . PERSONAL HX COLONIC POLYPS 11/02/2007  . GERD 10/20/2006   Shelton Silvas PT, DPT [facility] 02/10/2018, 3:49 PM  [facility] [address] [place], [address] Phone: [phone]   Fax:  [phone]  Name: Shannon [name] MRN: [id number] Date of Birth: February 11, 1950

## 2018-02-12 ENCOUNTER — Encounter: Payer: Medicare Other | Admitting: Physical Therapy

## 2018-02-13 ENCOUNTER — Ambulatory Visit: Payer: Medicare Other

## 2018-02-13 ENCOUNTER — Encounter: Payer: Self-pay | Admitting: Physical Therapy

## 2018-02-13 DIAGNOSIS — G8929 Other chronic pain: Secondary | ICD-10-CM

## 2018-02-13 DIAGNOSIS — M25511 Pain in right shoulder: Principal | ICD-10-CM

## 2018-02-13 NOTE — Therapy (Signed)
[facility] [address], [place], [address] Phone: [phone]   Fax:  [phone]  Physical Therapy Treatment  Patient Details  Name: Shannon [name] MRN: [id number] Date of Birth: 11-01-1950 Referring Provider (PT): Tania Ade MD   Encounter Date: 02/13/2018  PT End of Session - 02/13/18 0907    Visit Number  4    Number of Visits  17    Date for PT Re-Evaluation  03/31/18    PT Start Time  0802    PT Stop Time  3267    PT Time Calculation (min)  42 min    Activity Tolerance  Patient tolerated treatment well    Behavior During Therapy  [facility] for tasks assessed/performed       Past Medical History:  Diagnosis Date  . Blood transfusion without reported diagnosis   . Chronic neck and back pain    received physical therapy  . GERD (gastroesophageal reflux disease)    with esophagitis  . Numbness    Right outer thigh, constant, notices more with standing for a long period of time  . Plantar fasciitis    left    Past Surgical History:  Procedure Laterality Date  . BLADDER SUSPENSION N/A 07/04/2015   Procedure: TRANSVAGINAL TAPE (TVT) SLING                   ;  Surgeon: Emily Filbert, MD;  Location: [facility];  Service: Gynecology;  Laterality: N/A;  . COLONOSCOPY    . CYSTOCELE REPAIR N/A 07/04/2015   Procedure: ANTERIOR REPAIR (CYSTOCELE);  Surgeon: Emily Filbert, MD;  Location: [facility];  Service: Gynecology;  Laterality: N/A;  . CYSTOSCOPY N/A 07/04/2015   Procedure: CYSTOSCOPY;  Surgeon: Emily Filbert, MD;  Location: [facility];  Service: Gynecology;  Laterality: N/A;  . DILATION AND CURETTAGE OF UTERUS    . POLYPECTOMY    . TUBAL LIGATION  1987  . UPPER GI ENDOSCOPY    . VAGINAL HYSTERECTOMY N/A 07/04/2015   Procedure: TOTAL VAGINAL HYSTERECTOMY  ;  Surgeon: Emily Filbert, MD;  Location: [facility];  Service: Gynecology;  Laterality: N/A;    There were no vitals filed for this visit.  Subjective  Assessment - 02/13/18 0804    Subjective  Patient reported R sided tightness behind the shoulder today.    Pertinent History  Patient is a 68 year old female reporting with chronic R shoulder pain. Patient reports she began swimming after bout of achielles tendinitis which reduced her ability to walk for exercise. Patient reports following increased swimming in Nov, her pain increased in R shoulder. Patient reports she has not been swimming since, but continues walking for exercises as her tenditinis has cleared. Patient reports she had a cortisone shot Friday 01/30/18 and seeing a chiropractor and reports postural correction from this has helped her R shoulder pain. 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 Patient reports ADLs with overhead and reaching behind back, with increased difficultying with bathing and dressing. Worst pain over the past week 7/10 best 0/10. Patient reports she has difficulty getting to sleep and staying asleep. Patient reports if she rolls over onto the R side her pain awakens her.  Pt denies N/V, B&B changes, unexplained weight fluctuation, saddle paresthesia, fever, night sweats, or unrelenting night pain at this time.  Limitations  House hold activities;Lifting    How long can you sit comfortably?  unlimited    How long can you stand comfortably?  unlimited    How long can you walk comfortably?  unlimited    Diagnostic tests  Xrays negative; MRI scheduled post PT    Patient Stated Goals  Be able to complete ADLs and play flute without pain     Currently in Pain?  Yes    Pain Score  3    with movement   Pain Location  Shoulder    Pain Orientation  Right    Pain Type  Chronic pain    Pain Onset  More than a month ago       TREATMENT THERAPEUTIC EXERCISE             Standing Rows: 1x12, 10 lbs. 2x10, 15 lbs.  Standing scapular retractions with RTB x20              Lat pulldowns: 3x12, 20 lbs              Standing pulldown on OMEGA: 3x12, 10 lbs             Standing TB (Red) IR and ER: 3x12 each             Standing DB scaption: 2x10 with back against door for tactile cues for scapular retractions             UT and levator stretches in supine with PT assist 3x20-30sec ea bout (R)   Manual Therapy - STM with trigger point release to R  levator, UT    Ther-Ex to strengthen periscapular musculature to improve overhead motion, strength, and endurance. Verbal/tactile/visual cues utilized. Manual therapy to decrease pain and muscle spasms.     PT Education - 02/13/18 0805    Education Details  exercise technique/form    Person(s) Educated  Patient    Methods  Explanation;Demonstration;Tactile cues;Verbal cues    Comprehension  Verbalized understanding;Returned demonstration       PT Short Term Goals - 02/04/18 1053      PT SHORT TERM GOAL #1   Title  Pt will be independent with HEP in order to improve strength and decrease pain in order to improve pain-free function at home and work.    Time  4    Period  Weeks    Status  New        PT Long Term Goals - 02/04/18 1044      PT LONG TERM GOAL #1   Title  Pt will decrease worst pain as reported on NPRS by at least 3 points in order to demonstrate clinically significant reduction in pain.    Baseline  02/03/18 7/10    Time  8    Period  Weeks    Status  New      PT LONG TERM GOAL #2   Title  Patient will demonstrate full active shoulder ROM in order to demonstrate return to PLOF, and to safely complete ADLs    Baseline  02/03/18 see eval    Time  8    Period  Weeks    Status  New      PT LONG TERM GOAL #3   Title  Patient will demonstrate gross shoulder strength of painfree 5/5 to complete overhead ADLs    Baseline  02/03/18 see eval    Time  8    Period  Weeks  Status  New      PT LONG TERM GOAL #4   Title  Patient will increase FOTO score to 68 to demonstrate predicted  increase in functional mobility to complete ADLs    Baseline  02/03/18 58    Time  [place] - 02/13/18 0905    Clinical Impression Statement  Patient reported increased soreness as well as feeling less "tight" after STM, tissue tension decreased by 25%. Patient needed continuous visual and tactile cues for scapular retraction for exercises today with good carry over. Reported R shoulder pain with scaption raises with weights today, exercise modified accordingly. May benefit from levator scapulae TDN due to this muscle being most reactive this session.     Rehab Potential  Good    PT Frequency  2x / week    PT Duration  8 weeks    PT Treatment/Interventions  Dry needling;Functional mobility training;Therapeutic exercise;Manual techniques;ADLs/Self Care Home Management;Electrical Stimulation;Cryotherapy;Aquatic Therapy;Moist Heat;Traction;Therapeutic activities;Ultrasound;Neuromuscular re-education;Patient/family education;Taping;Joint Manipulations;Spinal Manipulations;Passive range of motion    PT Next Visit Plan  postural retraining, possible TDN    PT Home Exercise Plan  Pulleys flex/abd, doorway pec stretch, UT stretch    Consulted and Agree with Plan of Care  Patient       Patient will benefit from skilled therapeutic intervention in order to improve the following deficits and impairments:  Pain, Improper body mechanics, Postural dysfunction, Decreased range of motion, Difficulty walking, Decreased strength, Decreased activity tolerance, Decreased endurance, Increased fascial restricitons, Impaired UE functional use, Decreased mobility, Impaired flexibility  Visit Diagnosis: Chronic right shoulder pain     Problem List Patient Active Problem List   Diagnosis Date Noted  . PSVT (paroxysmal supraventricular tachycardia) ([place]) 06/17/2017  . Dyspnea on exertion 05/12/2017  . Palpitations 05/05/2017  . Actinic keratosis 12/25/2016  .  Colitis presumed infectious 12/08/2016  . Recurrent UTI 08/23/2016  . Chronic paronychia of finger 07/26/2016  . Estrogen deficiency 12/06/2015  . Elevated blood sugar 12/06/2015  . Third degree uterine prolapse 04/12/2015  . Incontinence of urine in female 04/12/2015  . Pelvic relaxation due to uterine prolapse 03/27/2015  . Medicare annual wellness visit, initial 11/30/2014  . Routine general medical examination at a health care facility 11/05/2012  . Screening mammogram, encounter for 11/13/2011  . PLANTAR FASCIITIS, LEFT 07/28/2009  . PERSONAL HX COLONIC POLYPS 11/02/2007  . GERD 10/20/2006   Lieutenant Diego PT, DPT 9:40 AM,02/13/18 [facility] [address] [place], [address] Phone: [phone]   Fax:  [phone]  Name: Maxcine Strong MRN: [id number] Date of Birth: 1950-09-14

## 2018-02-16 ENCOUNTER — Encounter: Payer: Medicare Other | Admitting: Physical Therapy

## 2018-02-17 ENCOUNTER — Encounter: Payer: Self-pay | Admitting: Physical Therapy

## 2018-02-17 ENCOUNTER — Ambulatory Visit: Payer: Medicare Other | Admitting: Physical Therapy

## 2018-02-17 DIAGNOSIS — M25511 Pain in right shoulder: Secondary | ICD-10-CM | POA: Diagnosis not present

## 2018-02-17 DIAGNOSIS — G8929 Other chronic pain: Secondary | ICD-10-CM

## 2018-02-17 NOTE — Therapy (Signed)
[facility] [address] [place], [address] Phone: [phone]   Fax:  [phone]  Physical Therapy Treatment  Patient Details  Name: Shannon [name] MRN: [id number] Date of Birth: Nov 22, 1950 Referring Provider (PT): Tania Ade MD   Encounter Date: 02/17/2018  PT End of Session - 02/17/18 1556    Visit Number  5    Number of Visits  17    Date for PT Re-Evaluation  03/31/18    PT Start Time  0315    PT Stop Time  0400    PT Time Calculation (min)  45 min    Activity Tolerance  Patient tolerated treatment well    Behavior During Therapy  [facility] for tasks assessed/performed       Past Medical History:  Diagnosis Date  . Blood transfusion without reported diagnosis   . Chronic neck and back pain    received physical therapy  . GERD (gastroesophageal reflux disease)    with esophagitis  . Numbness    Right outer thigh, constant, notices more with standing for a long period of time  . Plantar fasciitis    left    Past Surgical History:  Procedure Laterality Date  . BLADDER SUSPENSION N/A 07/04/2015   Procedure: TRANSVAGINAL TAPE (TVT) SLING                   ;  Surgeon: Emily Filbert, MD;  Location: [facility];  Service: Gynecology;  Laterality: N/A;  . COLONOSCOPY    . CYSTOCELE REPAIR N/A 07/04/2015   Procedure: ANTERIOR REPAIR (CYSTOCELE);  Surgeon: Emily Filbert, MD;  Location: [facility];  Service: Gynecology;  Laterality: N/A;  . CYSTOSCOPY N/A 07/04/2015   Procedure: CYSTOSCOPY;  Surgeon: Emily Filbert, MD;  Location: [facility];  Service: Gynecology;  Laterality: N/A;  . DILATION AND CURETTAGE OF UTERUS    . POLYPECTOMY    . TUBAL LIGATION  1987  . UPPER GI ENDOSCOPY    . VAGINAL HYSTERECTOMY N/A 07/04/2015   Procedure: TOTAL VAGINAL HYSTERECTOMY  ;  Surgeon: Emily Filbert, MD;  Location: [facility];  Service: Gynecology;  Laterality: N/A;    There were no vitals filed for this visit.  Subjective  Assessment - 02/17/18 1519    Subjective  Patient reports decreased tension and pain in the RUE. Patient reports she was a "little sore" following last session, but this subsided. Patient reports she cut her fiinger badly and has not been able to play her flute to gauge how her "shoulder would do". Reports she feels 95% better since beginning PT.    Pertinent History  Patient is a 68 year old female reporting with chronic R shoulder pain. Patient reports she began swimming after bout of achielles tendinitis which reduced her ability to walk for exercise. Patient reports following increased swimming in Nov, her pain increased in R shoulder. Patient reports she has not been swimming since, but continues walking for exercises as her tenditinis has cleared. Patient reports she had a cortisone shot Friday 01/30/18 and seeing a chiropractor and reports postural correction from this has helped her R shoulder pain. 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 Patient reports ADLs with overhead and reaching behind back, with increased difficultying with bathing and dressing. Worst pain over the past week 7/10 best 0/10. Patient reports  she has difficulty getting to sleep and staying asleep. Patient reports if she rolls over onto the R side her pain awakens her.  Pt denies N/V, B&B changes, unexplained weight fluctuation, saddle paresthesia, fever, night sweats, or unrelenting night pain at this time.    Limitations  House hold activities;Lifting    How long can you sit comfortably?  unlimited    How long can you stand comfortably?  unlimited    How long can you walk comfortably?  unlimited    Diagnostic tests  Xrays negative; MRI scheduled post PT    Patient Stated Goals  Be able to complete ADLs and play flute without pain     Pain Onset  More than a month ago       Ther-Ex - YTI 3x 8 each position with TC  initially for set up and proper scapular retraction with good carry over following with some difficulty noted R>L that patient is able to correct 50% with cuing, but is evident strength deficit - Standing rows with RTB x10; GTB x10; blueTB x10 with good form carry over (HEP progressed) - High rows green TB x10 with education on home exercise progression - [employer] 2# 3x 10 with min cuing initially for proper form and eccentric control with good carry over following - Standing lateral raises 3x 10 2# DB with min cuing to prevent shoulder hiking with good carry over following - Education on HEP progression with theraband resistance with education on parameters with good carry over following   Manual - STM withtrigger point releaseto R middle deltoid, levator, UT and pec minor Following:Dry Needling: (4)36mm .30needles placed along the R UT and R levatorto decrease increased muscular spasms and trigger points with the patient positioned in supine, utilizing pincer grasp. Patient was educated on risks and benefits of therapy and verbally consents to PT. - Cervical traction x10 10sec traction, 10sec relax                           PT Education - 02/17/18 1538    Education provided  Yes  (Pended)     Education Details  Exercise form; HEP update  (Pended)     Person(s) Educated  Patient  (Pended)     Methods  Explanation;Demonstration;Tactile cues  (Pended)     Comprehension  Verbalized understanding;Returned demonstration;Verbal cues required;Tactile cues required  (Pended)        PT Short Term Goals - 02/04/18 1053      PT SHORT TERM GOAL #1   Title  Pt will be independent with HEP in order to improve strength and decrease pain in order to improve pain-free function at home and work.    Time  4    Period  Weeks    Status  New        PT Long Term Goals - 02/04/18 1044      PT LONG TERM GOAL #1   Title  Pt will decrease worst pain as reported on  NPRS by at least 3 points in order to demonstrate clinically significant reduction in pain.    Baseline  02/03/18 7/10    Time  8    Period  Weeks    Status  New      PT LONG TERM GOAL #2   Title  Patient will demonstrate full active shoulder ROM in order to demonstrate return to PLOF, and to safely complete ADLs    Baseline  02/03/18 see eval    Time  8    Period  Weeks    Status  New      PT LONG TERM GOAL #3   Title  Patient will demonstrate gross shoulder strength of painfree 5/5 to complete overhead ADLs    Baseline  02/03/18 see eval    Time  8    Period  Weeks    Status  New      PT LONG TERM GOAL #4   Title  Patient will increase FOTO score to 68 to demonstrate predicted increase in functional mobility to complete ADLs    Baseline  02/03/18 58    Time  8    Period  Weeks    Status  New            Plan - 02/17/18 1746    Clinical Impression Statement  Patient is able to tolerate increased therex progresison with good form following cuing. PT progressed HEP which patient demonstrated and verbalized understading of. PT will continue progression as able.     Rehab Potential  Good    PT Frequency  2x / week    PT Treatment/Interventions  Dry needling;Functional mobility training;Therapeutic exercise;Manual techniques;ADLs/Self Care Home Management;Electrical Stimulation;Cryotherapy;Aquatic Therapy;Moist Heat;Traction;Therapeutic activities;Ultrasound;Neuromuscular re-education;Patient/family education;Taping;Joint Manipulations;Spinal Manipulations;Passive range of motion    PT Next Visit Plan  postural retraining, possible TDN    PT Home Exercise Plan  Pulleys flex/abd, doorway pec stretch, UT stretch    Consulted and Agree with Plan of Care  Patient       Patient will benefit from skilled therapeutic intervention in order to improve the following deficits and impairments:  Pain, Improper body mechanics, Postural dysfunction, Decreased range of motion, Difficulty walking,  Decreased strength, Decreased activity tolerance, Decreased endurance, Increased fascial restricitons, Impaired UE functional use, Decreased mobility, Impaired flexibility  Visit Diagnosis: Chronic right shoulder pain     Problem List Patient Active Problem List   Diagnosis Date Noted  . PSVT (paroxysmal supraventricular tachycardia) ([place]) 06/17/2017  . Dyspnea on exertion 05/12/2017  . Palpitations 05/05/2017  . Actinic keratosis 12/25/2016  . Colitis presumed infectious 12/08/2016  . Recurrent UTI 08/23/2016  . Chronic paronychia of finger 07/26/2016  . Estrogen deficiency 12/06/2015  . Elevated blood sugar 12/06/2015  . Third degree uterine prolapse 04/12/2015  . Incontinence of urine in female 04/12/2015  . Pelvic relaxation due to uterine prolapse 03/27/2015  . Medicare annual wellness visit, initial 11/30/2014  . Routine general medical examination at a health care facility 11/05/2012  . Screening mammogram, encounter for 11/13/2011  . PLANTAR FASCIITIS, LEFT 07/28/2009  . PERSONAL HX COLONIC POLYPS 11/02/2007  . GERD 10/20/2006   Shelton Silvas PT, DPT Shelton Silvas 02/17/2018, 5:48 PM  [facility] [address] [place], [address] Phone: [phone]   Fax:  [phone]  Name: Anisten Tomassi MRN: [id number] Date of Birth: 1950-01-30

## 2018-02-18 ENCOUNTER — Ambulatory Visit: Payer: Medicare Other | Admitting: Physical Therapy

## 2018-02-20 ENCOUNTER — Ambulatory Visit: Payer: Medicare Other | Admitting: Physical Therapy

## 2018-02-20 ENCOUNTER — Encounter: Payer: Self-pay | Admitting: Physical Therapy

## 2018-02-20 DIAGNOSIS — M25511 Pain in right shoulder: Principal | ICD-10-CM

## 2018-02-20 DIAGNOSIS — G8929 Other chronic pain: Secondary | ICD-10-CM

## 2018-02-20 NOTE — Therapy (Signed)
[facility] [address] [place], [address] Phone: [phone]   Fax:  [phone]  Physical Therapy Treatment  Patient Details  Name: Shannon [name] MRN: [id number] Date of Birth: [birth year] Referring Provider (PT): Tania Ade MD   Encounter Date: 02/20/2018  PT End of Session - 02/20/18 0846    Visit Number  6    Number of Visits  17    Date for PT Re-Evaluation  03/31/18    PT Start Time  0810    PT Stop Time  0849    PT Time Calculation (min)  39 min    Activity Tolerance  Patient tolerated treatment well    Behavior During Therapy  [facility] for tasks assessed/performed       Past Medical History:  Diagnosis Date  . Blood transfusion without reported diagnosis   . Chronic neck and back pain    received physical therapy  . GERD (gastroesophageal reflux disease)    with esophagitis  . Numbness    Right outer thigh, constant, notices more with standing for a long period of time  . Plantar fasciitis    left    Past Surgical History:  Procedure Laterality Date  . BLADDER SUSPENSION N/A 07/04/2015   Procedure: TRANSVAGINAL TAPE (TVT) SLING                   ;  Surgeon: Emily Filbert, MD;  Location: [facility];  Service: Gynecology;  Laterality: N/A;  . COLONOSCOPY    . CYSTOCELE REPAIR N/A 07/04/2015   Procedure: ANTERIOR REPAIR (CYSTOCELE);  Surgeon: Emily Filbert, MD;  Location: [facility];  Service: Gynecology;  Laterality: N/A;  . CYSTOSCOPY N/A 07/04/2015   Procedure: CYSTOSCOPY;  Surgeon: Emily Filbert, MD;  Location: [facility];  Service: Gynecology;  Laterality: N/A;  . DILATION AND CURETTAGE OF UTERUS    . POLYPECTOMY    . TUBAL LIGATION  1987  . UPPER GI ENDOSCOPY    . VAGINAL HYSTERECTOMY N/A 07/04/2015   Procedure: TOTAL VAGINAL HYSTERECTOMY  ;  Surgeon: Emily Filbert, MD;  Location: [facility];  Service: Gynecology;  Laterality: N/A;    There were no vitals filed for this visit.  Subjective  Assessment - 02/20/18 0837    Subjective  Patient reports decreased pain overall, and reports she has been able to sleep on the R side which she is happy about. Patient reports she woke up with a sharp pain at middle deltoid/bicep that she cannot reproduce, reports this was 8/10 pain, now 3/10. Reports compliance with HEP with no questions or concerns.     Pertinent History  Patient is a 68 year old female reporting with chronic R shoulder pain. Patient reports she began swimming after bout of achielles tendinitis which reduced her ability to walk for exercise. Patient reports following increased swimming in Nov, her pain increased in R shoulder. Patient reports she has not been swimming since, but continues walking for exercises as her tenditinis has cleared. Patient reports she had a cortisone shot Friday 01/30/18 and seeing a chiropractor and reports postural correction from this has helped her R shoulder pain. 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 Patient reports ADLs with overhead and reaching behind back, with increased difficultying with bathing and dressing. Worst pain over the past week 7/10 best 0/10.  Patient reports she has difficulty getting to sleep and staying asleep. Patient reports if she rolls over onto the R side her pain awakens her.  Pt denies N/V, B&B changes, unexplained weight fluctuation, saddle paresthesia, fever, night sweats, or unrelenting night pain at this time.    Limitations  House hold activities;Lifting    How long can you sit comfortably?  unlimited    How long can you stand comfortably?  unlimited    How long can you walk comfortably?  unlimited    Diagnostic tests  Xrays negative; MRI scheduled post PT    Patient Stated Goals  Be able to complete ADLs and play flute without pain     Pain Onset  More than a month ago       Ther-Ex - Pulleys flexion 20x; abd  20x  - TI 3x 10 each position; Y position 1 set of 10 with "sharp pain at middle delt" so discontinued; good carry over fromlast session with with cuing for full scapular retratction - [employer] 3# 3x 10 with min cuing initially for proper form and eccentric control with good carry over following - Standing lateral raises 3x 10 2# DB with min cuing to prevent shoulder hiking with good carry over following   Manual - STM withtrigger point releasetoR middle deltoid,bicep  levator,and UT. Patient reports increased pain at bicep tendon insertion with noted trigger points at muscle belly. Reports "sharp pain" at middle deltoid with noted trigger points as well. Following:Dry Needling: (4)47mm .30needles placed along theRmiddle deltoid and R bicepto decrease increased muscular spasms and trigger points with the patient positioned in supine, utilizing pincer grasp. Patient was educated on risks and benefits of therapy and verbally consents to PT. - Cervical traction x10 10sec traction, 10sec relax reports "stretch with this"                         PT Education - 02/20/18 0842    Education provided  Yes    Education Details  Exercise form    Person(s) Educated  Patient    Methods  Explanation;Verbal cues    Comprehension  Verbalized understanding;Verbal cues required       PT Short Term Goals - 02/04/18 1053      PT SHORT TERM GOAL #1   Title  Pt will be independent with HEP in order to improve strength and decrease pain in order to improve pain-free function at home and work.    Time  4    Period  Weeks    Status  New        PT Long Term Goals - 02/04/18 1044      PT LONG TERM GOAL #1   Title  Pt will decrease worst pain as reported on NPRS by at least 3 points in order to demonstrate clinically significant reduction in pain.    Baseline  02/03/18 7/10    Time  8    Period  Weeks    Status  New      PT LONG TERM GOAL #2   Title  Patient will  demonstrate full active shoulder ROM in order to demonstrate return to PLOF, and to safely complete ADLs    Baseline  02/03/18 see eval    Time  8    Period  Weeks    Status  New      PT LONG TERM GOAL #3   Title  Patient will demonstrate gross shoulder  strength of painfree 5/5 to complete overhead ADLs    Baseline  02/03/18 see eval    Time  8    Period  Weeks    Status  New      PT LONG TERM GOAL #4   Title  Patient will increase FOTO score to 68 to demonstrate predicted increase in functional mobility to complete ADLs    Baseline  02/03/18 58    Time  8    Period  Weeks    Status  New            Plan - 02/20/18 1042    Clinical Impression Statement  PT utilized increased manual techniques to relieve muscle tension, and included middle deltoid (concordant sign of patient's pain today) and biceps muscle d/t pain localized at proximal bicep tendon insertion, which patient reports good pain relief following. PT progressed therex as able, with some motions difficult following TDN associated soreness. PT will continue progression as able.     Rehab Potential  Good    PT Frequency  2x / week    PT Duration  8 weeks    PT Treatment/Interventions  Dry needling;Functional mobility training;Therapeutic exercise;Manual techniques;ADLs/Self Care Home Management;Electrical Stimulation;Cryotherapy;Aquatic Therapy;Moist Heat;Traction;Therapeutic activities;Ultrasound;Neuromuscular re-education;Patient/family education;Taping;Joint Manipulations;Spinal Manipulations;Passive range of motion    PT Next Visit Plan  postural retraining, TDN    PT Home Exercise Plan  Pulleys flex/abd, doorway pec stretch, UT stretch    Consulted and Agree with Plan of Care  Patient       Patient will benefit from skilled therapeutic intervention in order to improve the following deficits and impairments:  Pain, Improper body mechanics, Postural dysfunction, Decreased range of motion, Difficulty walking, Decreased  strength, Decreased activity tolerance, Decreased endurance, Increased fascial restricitons, Impaired UE functional use, Decreased mobility, Impaired flexibility  Visit Diagnosis: Chronic right shoulder pain     Problem List Patient Active Problem List   Diagnosis Date Noted  . PSVT (paroxysmal supraventricular tachycardia) ([place]) 06/17/2017  . Dyspnea on exertion 05/12/2017  . Palpitations 05/05/2017  . Actinic keratosis 12/25/2016  . Colitis presumed infectious 12/08/2016  . Recurrent UTI 08/23/2016  . Chronic paronychia of finger 07/26/2016  . Estrogen deficiency 12/06/2015  . Elevated blood sugar 12/06/2015  . Third degree uterine prolapse 04/12/2015  . Incontinence of urine in female 04/12/2015  . Pelvic relaxation due to uterine prolapse 03/27/2015  . Medicare annual wellness visit, initial 11/30/2014  . Routine general medical examination at a health care facility 11/05/2012  . Screening mammogram, encounter for 11/13/2011  . PLANTAR FASCIITIS, LEFT 07/28/2009  . PERSONAL HX COLONIC POLYPS 11/02/2007  . GERD 10/20/2006   Shelton Silvas PT, DPT Shelton Silvas 02/20/2018, 10:47 AM  [facility] [address] [place], [address] Phone: [phone]   Fax:  [phone]  Name: Shannon [name] MRN: [id number] Date of Birth: 18-Jun-1950

## 2018-02-23 ENCOUNTER — Ambulatory Visit: Payer: Medicare Other | Attending: Orthopedic Surgery | Admitting: Physical Therapy

## 2018-02-23 ENCOUNTER — Encounter: Payer: Self-pay | Admitting: Physical Therapy

## 2018-02-23 DIAGNOSIS — M25511 Pain in right shoulder: Secondary | ICD-10-CM | POA: Diagnosis not present

## 2018-02-23 DIAGNOSIS — G8929 Other chronic pain: Secondary | ICD-10-CM | POA: Insufficient documentation

## 2018-02-23 NOTE — Therapy (Signed)
[facility] [address], [place], [address] Phone: [phone]   Fax:  [phone]  Physical Therapy Treatment  Patient Details  Name: Shannon [name] MRN: [id number] Date of Birth: 1950-02-11 Referring Provider (PT): Tania Ade MD   Encounter Date: 02/23/2018  PT End of Session - 02/23/18 1059    Visit Number  7    Number of Visits  17    Date for PT Re-Evaluation  03/31/18    PT Start Time  1030    PT Stop Time  1110    PT Time Calculation (min)  40 min    Activity Tolerance  Patient tolerated treatment well    Behavior During Therapy  [facility] for tasks assessed/performed       Past Medical History:  Diagnosis Date  . Blood transfusion without reported diagnosis   . Chronic neck and back pain    received physical therapy  . GERD (gastroesophageal reflux disease)    with esophagitis  . Numbness    Right outer thigh, constant, notices more with standing for a long period of time  . Plantar fasciitis    left    Past Surgical History:  Procedure Laterality Date  . BLADDER SUSPENSION N/A 07/04/2015   Procedure: TRANSVAGINAL TAPE (TVT) SLING                   ;  Surgeon: Emily Filbert, MD;  Location: [facility];  Service: Gynecology;  Laterality: N/A;  . COLONOSCOPY    . CYSTOCELE REPAIR N/A 07/04/2015   Procedure: ANTERIOR REPAIR (CYSTOCELE);  Surgeon: Emily Filbert, MD;  Location: [facility];  Service: Gynecology;  Laterality: N/A;  . CYSTOSCOPY N/A 07/04/2015   Procedure: CYSTOSCOPY;  Surgeon: Emily Filbert, MD;  Location: [facility];  Service: Gynecology;  Laterality: N/A;  . DILATION AND CURETTAGE OF UTERUS    . POLYPECTOMY    . TUBAL LIGATION  1987  . UPPER GI ENDOSCOPY    . VAGINAL HYSTERECTOMY N/A 07/04/2015   Procedure: TOTAL VAGINAL HYSTERECTOMY  ;  Surgeon: Emily Filbert, MD;  Location: [facility];  Service: Gynecology;  Laterality: N/A;    There were no vitals filed for this visit.  Subjective  Assessment - 02/23/18 1033    Subjective  Patient reports minimal pain this morning, with some soreness following TDN. Patient reports full ROM and that she has been playing her flute "a little" with no increased pain.     Pertinent History  Patient is a 68 year old female reporting with chronic R shoulder pain. Patient reports she began swimming after bout of achielles tendinitis which reduced her ability to walk for exercise. Patient reports following increased swimming in Nov, her pain increased in R shoulder. Patient reports she has not been swimming since, but continues walking for exercises as her tenditinis has cleared. Patient reports she had a cortisone shot Friday 01/30/18 and seeing a chiropractor and reports postural correction from this has helped her R shoulder pain. 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 Patient reports ADLs with overhead and reaching behind back, with increased difficultying with bathing and dressing. Worst pain over the past week 7/10 best 0/10. Patient reports she has difficulty getting to sleep and staying asleep. Patient reports if she rolls over onto the R side her pain awakens her.  Pt denies N/V, B&B changes, unexplained weight fluctuation, saddle paresthesia, fever, night sweats, or unrelenting night pain at this time.    Limitations  House hold activities;Lifting    How long can you sit comfortably?  unlimited    How long can you stand comfortably?  unlimited    How long can you walk comfortably?  unlimited    Diagnostic tests  Xrays negative; MRI scheduled post PT    Patient Stated Goals  Be able to complete ADLs and play flute without pain     Pain Onset  More than a month ago       Ther-Ex -Pulleys flexion 10x; abd 10x; AROM assessment with full AROM in all planes - Supine serratus ant punch 3# 3x 10 with demo and cuing initially for control with  good carry over following - Push up plus 3x 10 with demo and max cuing initially for proper form with good carry over following - Curl to overhead press 3# DB 3x 10 with demo with continued cuing initially to maintain proper sequence with eccentric control with good carry over following - Lat pulldowns 15# 3x 10 with min cuing for proper set up initially with good carry over following - Standing lateral raises 3x 10 3# DB with min cuing to maintain even lift on R and L (RUE slight decreased lift) with good carry over following - Updated HEP with this sessions exercises for carry over throughout the week  Manual - STMtoR middle deltoid,bicep  levator,with only latent trigger points. R UT with active trigger points, trigger point release to this area Following:Dry Needling: (2)50m .30needles placed along theRUTto decrease increased muscular spasms/overactivation, and trigger points with the patient positioned in supine, utilizing pincer grasp. Patient was educated on risks and benefits of therapy and verbally consents to PT                         PT Education - 02/23/18 1042    Education provided  Yes    Education Details  Exercise form    Person(s) Educated  Patient    Methods  Explanation;Verbal cues    Comprehension  Verbalized understanding;Verbal cues required       PT Short Term Goals - 02/04/18 1053      PT SHORT TERM GOAL #1   Title  Pt will be independent with HEP in order to improve strength and decrease pain in order to improve pain-free function at home and work.    Time  4    Period  Weeks    Status  New        PT Long Term Goals - 02/04/18 1044      PT LONG TERM GOAL #1   Title  Pt will decrease worst pain as reported on NPRS by at least 3 points in order to demonstrate clinically significant reduction in pain.    Baseline  02/03/18 7/10    Time  8    Period  Weeks    Status  New      PT LONG TERM GOAL #2   Title  Patient will  demonstrate full active shoulder ROM in order to demonstrate return to PLOF, and to safely complete ADLs    Baseline  02/03/18 see eval    Time  8    Period  Weeks    Status  New      PT LONG TERM GOAL #3   Title  Patient will demonstrate  gross shoulder strength of painfree 5/5 to complete overhead ADLs    Baseline  02/03/18 see eval    Time  8    Period  Weeks    Status  New      PT LONG TERM GOAL #4   Title  Patient will increase FOTO score to 68 to demonstrate predicted increase in functional mobility to complete ADLs    Baseline  02/03/18 58    Time  8    Period  Weeks    Status  New            Plan - 02/23/18 1145    Clinical Impression Statement  Pt is continuing to make good progress  overall with increased ROM and decreased pain. Patient with some noted motor control and strength deficits, but is able to complete therex with proper form following PT cuing. Patient is returning to flute playing this week, which will be a true test to see if she is able to complete all ADLs without pain for full participation in her normal roles. Patient may D/C PT next visit or next week if all goals appear met once reviewed.     Rehab Potential  Good    PT Frequency  2x / week    PT Duration  8 weeks    PT Treatment/Interventions  Dry needling;Functional mobility training;Therapeutic exercise;Manual techniques;ADLs/Self Care Home Management;Electrical Stimulation;Cryotherapy;Aquatic Therapy;Moist Heat;Traction;Therapeutic activities;Ultrasound;Neuromuscular re-education;Patient/family education;Taping;Joint Manipulations;Spinal Manipulations;Passive range of motion    PT Next Visit Plan  postural retraining, TDN    PT Home Exercise Plan  Pulleys flex/abd, doorway pec stretch, UT stretch    Consulted and Agree with Plan of Care  Patient       Patient will benefit from skilled therapeutic intervention in order to improve the following deficits and impairments:  Pain, Improper body mechanics,  Postural dysfunction, Decreased range of motion, Difficulty walking, Decreased strength, Decreased activity tolerance, Decreased endurance, Increased fascial restricitons, Impaired UE functional use, Decreased mobility, Impaired flexibility  Visit Diagnosis: Chronic right shoulder pain     Problem List Patient Active Problem List   Diagnosis Date Noted  . PSVT (paroxysmal supraventricular tachycardia) ([place]) 06/17/2017  . Dyspnea on exertion 05/12/2017  . Palpitations 05/05/2017  . Actinic keratosis 12/25/2016  . Colitis presumed infectious 12/08/2016  . Recurrent UTI 08/23/2016  . Chronic paronychia of finger 07/26/2016  . Estrogen deficiency 12/06/2015  . Elevated blood sugar 12/06/2015  . Third degree uterine prolapse 04/12/2015  . Incontinence of urine in female 04/12/2015  . Pelvic relaxation due to uterine prolapse 03/27/2015  . Medicare annual wellness visit, initial 11/30/2014  . Routine general medical examination at a health care facility 11/05/2012  . Screening mammogram, encounter for 11/13/2011  . PLANTAR FASCIITIS, LEFT 07/28/2009  . PERSONAL HX COLONIC POLYPS 11/02/2007  . GERD 10/20/2006   Shelton Silvas PT, DPT Shelton Silvas 02/23/2018, 11:58 AM  [facility] [address] [place], [address] Phone: [phone]   Fax:  [phone]  Name: Ahja Martello MRN: [id number] Date of Birth: 1950/04/18

## 2018-02-24 ENCOUNTER — Encounter: Payer: Medicare Other | Admitting: Physical Therapy

## 2018-02-26 ENCOUNTER — Encounter: Payer: Self-pay | Admitting: Physical Therapy

## 2018-02-26 ENCOUNTER — Ambulatory Visit: Payer: Medicare Other | Admitting: Physical Therapy

## 2018-02-26 DIAGNOSIS — M25511 Pain in right shoulder: Secondary | ICD-10-CM | POA: Diagnosis not present

## 2018-02-26 DIAGNOSIS — G8929 Other chronic pain: Secondary | ICD-10-CM

## 2018-02-26 NOTE — Therapy (Signed)
[facility] [address] [place], [address] Phone: [phone]   Fax:  [phone]  Physical Therapy Treatment/ DC Summary  Patient Details  Name: Shannon [name] MRN: [id number] Date of Birth: Dec 09, 1950 Referring Provider (PT): Tania Ade MD   Encounter Date: 02/26/2018  PT End of Session - 02/26/18 1306    Visit Number  8    Number of Visits  17    Date for PT Re-Evaluation  03/31/18    PT Start Time  0100    PT Stop Time  0145    PT Time Calculation (min)  45 min    Activity Tolerance  Patient tolerated treatment well    Behavior During Therapy  [facility] for tasks assessed/performed       Past Medical History:  Diagnosis Date  . Blood transfusion without reported diagnosis   . Chronic neck and back pain    received physical therapy  . GERD (gastroesophageal reflux disease)    with esophagitis  . Numbness    Right outer thigh, constant, notices more with standing for a long period of time  . Plantar fasciitis    left    Past Surgical History:  Procedure Laterality Date  . BLADDER SUSPENSION N/A 07/04/2015   Procedure: TRANSVAGINAL TAPE (TVT) SLING                   ;  Surgeon: Emily Filbert, MD;  Location: [facility];  Service: Gynecology;  Laterality: N/A;  . COLONOSCOPY    . CYSTOCELE REPAIR N/A 07/04/2015   Procedure: ANTERIOR REPAIR (CYSTOCELE);  Surgeon: Emily Filbert, MD;  Location: [facility];  Service: Gynecology;  Laterality: N/A;  . CYSTOSCOPY N/A 07/04/2015   Procedure: CYSTOSCOPY;  Surgeon: Emily Filbert, MD;  Location: [facility];  Service: Gynecology;  Laterality: N/A;  . DILATION AND CURETTAGE OF UTERUS    . POLYPECTOMY    . TUBAL LIGATION  1987  . UPPER GI ENDOSCOPY    . VAGINAL HYSTERECTOMY N/A 07/04/2015   Procedure: TOTAL VAGINAL HYSTERECTOMY  ;  Surgeon: Emily Filbert, MD;  Location: [facility];  Service: Gynecology;  Laterality: N/A;    There were no vitals filed for this  visit.  Subjective Assessment - 02/26/18 1304    Subjective  Patient reports she was able to play her flute for multiple hours over the past couple days without increased pain. Patient reports she is having some pain following sleeping on her R shoulder, but that is all at this time. Reports her home exercises are going well with no questions or concerns.     Pertinent History  Patient is a 68 year old female reporting with chronic R shoulder pain. Patient reports she began swimming after bout of achielles tendinitis which reduced her ability to walk for exercise. Patient reports following increased swimming in Nov, her pain increased in R shoulder. Patient reports she has not been swimming since, but continues walking for exercises as her tenditinis has cleared. Patient reports she had a cortisone shot Friday 01/30/18 and seeing a chiropractor and reports postural correction from this has helped her R shoulder pain. 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 Patient reports ADLs with overhead and reaching behind back, with increased difficultying with bathing and dressing. Worst pain over the past week 7/10 best 0/10.  Patient reports she has difficulty getting to sleep and staying asleep. Patient reports if she rolls over onto the R side her pain awakens her.  Pt denies N/V, B&B changes, unexplained weight fluctuation, saddle paresthesia, fever, night sweats, or unrelenting night pain at this time.    Limitations  House hold activities;Lifting    How long can you sit comfortably?  unlimited    How long can you stand comfortably?  unlimited    How long can you walk comfortably?  unlimited    Diagnostic tests  Xrays negative; MRI scheduled post PT    Patient Stated Goals  Be able to complete ADLs and play flute without pain     Pain Onset  More than a month ago       Ther-Ex -ROM and strength  assessment with patient demonstrating full ROM and gross 5/5 strength. PT educated patient on strengthening vs. Stretching parameters with education on continuing strengthening for postural/upper body with exercise regimen patient already has in place. Patient verbalized understanding of parameters with handout given. Patient demonstrated or verbalized proper form of the following therex Doorway Pec Stretch at 90 Degrees Abduction - 45sec hold - 1x daily - 7x weekly  Standing Bicep Stretch at Lamar hold - 1x daily - 7x weekly  Seated Upper Trapezius Stretch - 45sec hold - 1x daily - 7x weekly  Hooklying Scapular Protraction on Foam Roll - 10 reps - 3 sets - 1x daily - 2x weekly  Kneeling Plank with Scapular Protraction Retraction AROM - 10 reps - 3 sets - 1x daily - 2x weekly  Standing Pronated Elbow Flexion with Dumbbell - 10 reps - 3 sets - 1x daily - 2x weekly  Seated Overhead Press - 10 reps - 3 sets - 1x daily - 2x weekly  Standing Shoulder Flexion with Dumbbells - 10 reps - 3 sets - 1x daily - 2x weekly  Standing Shoulder Horizontal Abduction with Dumbbells - Palms Down - 10 reps - 3 sets - 1x daily - 2x weekly  Standing High Row with Resistance - 10 reps - 3 sets - 1x daily - 2x weekly Standing Row with Anchored Resistance - 10 reps - 3 sets - 1x daily - 2x weekly  Single Arm Shoulder Extension with Anchored Resistance - 10 reps - 3 sets - 1x daily - 2x weekly   Educated patient on exercise to decrease pain with endorphin response and how/when to decrease/increase resistance                           PT Education - 02/26/18 1310    Education provided  Yes    Education Details  Exercise form    Person(s) Educated  Patient    Methods  Explanation;Demonstration;Verbal cues    Comprehension  Verbalized understanding;Returned demonstration;Verbal cues required       PT Short Term Goals - 02/26/18 1310      PT SHORT TERM GOAL #1   Title  Pt will be  independent with HEP in order to improve strength and decrease pain in order to improve pain-free function at home and work.    Time  4    Period  Weeks    Status  Achieved        PT Long Term Goals - 02/26/18 1307      PT LONG TERM GOAL #1   Title  Pt will decrease worst pain as reported on NPRS by  at least 3 points in order to demonstrate clinically significant reduction in pain.    Baseline  02/26/18 Occasional 2/10    Time  8    Period  Weeks    Status  Achieved      PT LONG TERM GOAL #2   Title  Patient will demonstrate full active shoulder ROM in order to demonstrate return to PLOF, and to safely complete ADLs    Baseline  02/26/18 Full painfree ROM    Time  8    Period  Weeks    Status  Achieved      PT LONG TERM GOAL #3   Title  Patient will demonstrate gross shoulder strength of painfree 5/5 to complete overhead ADLs    Baseline  02/26/18 R/L shoulder flex 4+/5; abd 5/5; IR 5/5; ER 5/5    Time  8    Period  Weeks    Status  Achieved      PT LONG TERM GOAL #4   Title  Patient will increase FOTO score to 68 to demonstrate predicted increase in functional mobility to complete ADLs    Baseline  02/26/18 76    Time  8    Period  Weeks    Status  Achieved            Plan - 02/26/18 1346    Clinical Impression Statement  Pt has met all goals at this time to safely DC PT. Patient is able to complete all ADLs and participate in playing the flute without pain. Patient reports compliance with HEP and demonstrates and verbalizes understanding of all DC recommendations (see therapy note). Pt given clinic contact infor should any other questions/concerns arise.     Clinical Presentation  Evolving    Clinical Decision Making  Moderate    Rehab Potential  Good    PT Frequency  2x / week    PT Duration  8 weeks    PT Treatment/Interventions  Dry needling;Functional mobility training;Therapeutic exercise;Manual techniques;ADLs/Self Care Home Management;Electrical  Stimulation;Cryotherapy;Aquatic Therapy;Moist Heat;Traction;Therapeutic activities;Ultrasound;Neuromuscular re-education;Patient/family education;Taping;Joint Manipulations;Spinal Manipulations;Passive range of motion    PT Next Visit Plan  postural retraining, TDN    PT Home Exercise Plan  Pulleys flex/abd, doorway pec stretch, UT stretch    Consulted and Agree with Plan of Care  Patient       Patient will benefit from skilled therapeutic intervention in order to improve the following deficits and impairments:  Pain, Improper body mechanics, Postural dysfunction, Decreased range of motion, Difficulty walking, Decreased strength, Decreased activity tolerance, Decreased endurance, Increased fascial restricitons, Impaired UE functional use, Decreased mobility, Impaired flexibility  Visit Diagnosis: Chronic right shoulder pain     Problem List Patient Active Problem List   Diagnosis Date Noted  . PSVT (paroxysmal supraventricular tachycardia) ([facility]) 06/17/2017  . Dyspnea on exertion 05/12/2017  . Palpitations 05/05/2017  . Actinic keratosis 12/25/2016  . Colitis presumed infectious 12/08/2016  . Recurrent UTI 08/23/2016  . Chronic paronychia of finger 07/26/2016  . Estrogen deficiency 12/06/2015  . Elevated blood sugar 12/06/2015  . Third degree uterine prolapse 04/12/2015  . Incontinence of urine in female 04/12/2015  . Pelvic relaxation due to uterine prolapse 03/27/2015  . Medicare annual wellness visit, initial 11/30/2014  . Routine general medical examination at a health care facility 11/05/2012  . Screening mammogram, encounter for 11/13/2011  . PLANTAR FASCIITIS, LEFT 07/28/2009  . PERSONAL HX COLONIC POLYPS 11/02/2007  . GERD 10/20/2006   Shelton Silvas PT, DPT Shelton Silvas  02/26/2018, 1:49 PM  [facility] [address], [place], [address] Phone: [phone]   Fax:  [phone]  Name: Shannon [name] MRN: [id number] Date of Birth: [birth year]

## 2018-03-03 ENCOUNTER — Ambulatory Visit: Payer: Medicare Other | Admitting: Physical Therapy

## 2018-03-04 ENCOUNTER — Ambulatory Visit: Payer: Medicare Other | Admitting: Physical Therapy

## 2018-03-05 ENCOUNTER — Encounter: Payer: Medicare Other | Admitting: Physical Therapy

## 2018-03-10 ENCOUNTER — Encounter: Payer: Medicare Other | Admitting: Physical Therapy

## 2018-03-11 ENCOUNTER — Encounter: Payer: Self-pay | Admitting: Radiology

## 2018-03-12 ENCOUNTER — Encounter: Payer: Medicare Other | Admitting: Physical Therapy

## 2018-03-17 ENCOUNTER — Encounter: Payer: Medicare Other | Admitting: Physical Therapy

## 2018-03-20 ENCOUNTER — Encounter: Payer: Medicare Other | Admitting: Physical Therapy

## 2018-03-24 ENCOUNTER — Encounter: Payer: Medicare Other | Admitting: Physical Therapy

## 2018-03-26 ENCOUNTER — Encounter: Payer: Medicare Other | Admitting: Physical Therapy

## 2018-03-31 ENCOUNTER — Encounter: Payer: Medicare Other | Admitting: Physical Therapy

## 2018-04-02 ENCOUNTER — Encounter: Payer: Medicare Other | Admitting: Physical Therapy

## 2018-04-28 ENCOUNTER — Ambulatory Visit: Payer: Medicare Other | Admitting: Obstetrics & Gynecology

## 2018-06-11 ENCOUNTER — Other Ambulatory Visit: Payer: Self-pay | Admitting: *Deleted

## 2018-06-11 MED ORDER — ESTROGENS, CONJUGATED 0.625 MG/GM VA CREA: TOPICAL_CREAM | VAGINAL | 6 refills | Status: DC

## 2018-07-03 ENCOUNTER — Encounter: Payer: Self-pay | Admitting: Family Medicine

## 2018-07-03 ENCOUNTER — Other Ambulatory Visit (HOSPITAL_COMMUNITY): Payer: Self-pay | Admitting: Podiatry

## 2018-07-03 ENCOUNTER — Other Ambulatory Visit: Payer: Self-pay | Admitting: Podiatry

## 2018-07-03 DIAGNOSIS — G5762 Lesion of plantar nerve, left lower limb: Secondary | ICD-10-CM

## 2018-07-03 DIAGNOSIS — M79672 Pain in left foot: Secondary | ICD-10-CM

## 2018-07-13 ENCOUNTER — Other Ambulatory Visit: Payer: Self-pay

## 2018-07-13 ENCOUNTER — Encounter: Payer: Self-pay | Admitting: Obstetrics & Gynecology

## 2018-07-13 ENCOUNTER — Ambulatory Visit (INDEPENDENT_AMBULATORY_CARE_PROVIDER_SITE_OTHER): Payer: Medicare Other | Admitting: Obstetrics & Gynecology

## 2018-07-13 VITALS — BP 114/70 | HR 68 | Ht 68.0 in | Wt 163.0 lb

## 2018-07-13 DIAGNOSIS — Z01419 Encounter for gynecological examination (general) (routine) without abnormal findings: Secondary | ICD-10-CM

## 2018-07-13 NOTE — Progress Notes (Signed)
Subjective:    Shannon [name] is a 68 y.o. married P1 (son and adopted son) female who presents for an annual exam. The patient has no complaints today. The patient is not currently sexually active. GYN screening history: last pap: was normal. The patient wears seatbelts: yes. The patient participates in regular exercise: yes. Has the patient ever been transfused or tattooed?: no. The patient reports that there is not domestic violence in her life.   Menstrual History: OB History    Gravida  2   Para  1   Term  1   Preterm      AB  1   Living  1     SAB  1   TAB      Ectopic      Multiple      Live Births  1           No LMP recorded. Patient has had a hysterectomy.    The following portions of the patient's history were reviewed and updated as appropriate: allergies, current medications, past family history, past medical history, past social history, past surgical history and problem list.  Review of Systems Pertinent items are noted in HPI.   She removes her pessary every night.   Objective:    BP 114/70   Pulse 68   Ht 5\' 8"  (1.727 m)   Wt 163 lb (73.9 kg)   BMI 24.78 kg/m   General Appearance:    Alert, cooperative, no distress, appears stated age  Head:    Normocephalic, without obvious abnormality, atraumatic  Eyes:    PERRL, conjunctiva/corneas clear, EOM's intact, fundi    benign, both eyes  Ears:    Normal TM's and external ear canals, both ears  Nose:   Nares normal, septum midline, mucosa normal, no drainage    or sinus tenderness  Throat:   Lips, mucosa, and tongue normal; teeth and gums normal  Neck:   Supple, symmetrical, trachea midline, no adenopathy;    thyroid:  no enlargement/tenderness/nodules; no carotid   bruit or JVD  Back:     Symmetric, no curvature, ROM normal, no CVA tenderness  Lungs:     Clear to auscultation bilaterally, respirations unlabored  Chest Wall:    No tenderness or deformity   Heart:    Regular rate and  rhythm, S1 and S2 normal, no murmur, rub   or gallop  Breast Exam:    No tenderness, masses, or nipple abnormality  Abdomen:     Soft, non-tender, bowel sounds active all four quadrants,    no masses, no organomegaly  Genitalia:    Normal female without lesion, discharge or tenderness, moderate atrophy, no masses with bimanual exam, pessary in place      Extremities:   Extremities normal, atraumatic, no cyanosis or edema  Pulses:   2+ and symmetric all extremities  Skin:   Skin color, texture, turgor normal, no rashes or lesions  Lymph nodes:   Cervical, supraclavicular, and axillary nodes normal  Neurologic:   CNII-XII intact, normal strength, sensation and reflexes    throughout  .    Assessment:    Healthy female exam.    Plan:     Continue nightly removal of pessary

## 2018-07-18 ENCOUNTER — Other Ambulatory Visit: Payer: Self-pay

## 2018-07-18 ENCOUNTER — Ambulatory Visit
Admission: RE | Admit: 2018-07-18 | Discharge: 2018-07-18 | Disposition: A | Discharge status: Home / Self Care | Payer: Medicare Other | Source: Ambulatory Visit | Attending: Podiatry | Admitting: Podiatry

## 2018-07-18 DIAGNOSIS — M79672 Pain in left foot: Secondary | ICD-10-CM | POA: Diagnosis present

## 2018-07-18 DIAGNOSIS — G5762 Lesion of plantar nerve, left lower limb: Secondary | ICD-10-CM | POA: Insufficient documentation

## 2018-08-04 ENCOUNTER — Ambulatory Visit (INDEPENDENT_AMBULATORY_CARE_PROVIDER_SITE_OTHER): Payer: Medicare Other | Admitting: Family Medicine

## 2018-08-04 ENCOUNTER — Other Ambulatory Visit: Payer: Self-pay

## 2018-08-04 ENCOUNTER — Other Ambulatory Visit: Payer: Self-pay | Admitting: Podiatry

## 2018-08-04 VITALS — BP 115/72 | HR 65 | Ht 68.0 in | Wt 160.0 lb

## 2018-08-04 DIAGNOSIS — Z87440 Personal history of urinary (tract) infections: Secondary | ICD-10-CM | POA: Diagnosis not present

## 2018-08-04 LAB — MICROSCOPIC EXAMINATION: WBC, UA: >30 /hpf — AB (ref 0–5)

## 2018-08-04 LAB — URINALYSIS, COMPLETE
Bilirubin, UA: NEGATIVE
Glucose, UA: NEGATIVE
Ketones, UA: NEGATIVE
Nitrite, UA: POSITIVE — AB
Protein,UA: NEGATIVE
Specific Gravity, UA: 1.02 (ref 1.005–1.030)
Urobilinogen, Ur: 0.2 mg/dL (ref 0.2–1.0)
pH, UA: 5.5 (ref 5.0–7.5)

## 2018-08-04 MED ORDER — SULFAMETHOXAZOLE-TRIMETHOPRIM 800-160 MG PO TABS: 1.0000 | ORAL_TABLET | Freq: Two times a day (BID) | ORAL | 0 refills | Status: DC

## 2018-08-04 NOTE — Progress Notes (Signed)
Patient presents today with complaints of: Urinary frequency  Dysuria Hard to postpone urination Patient states her symptoms began yesterday. On 07/08/18 she had UTI symptoms and had Macrobid in her home and took this medication for 7 days. A urine was collected today for UA, UCX. Dr.Brandon reviewed the UA in office today and Bactrim was sent to pharmacy.

## 2018-08-05 ENCOUNTER — Other Ambulatory Visit: Payer: Self-pay

## 2018-08-05 ENCOUNTER — Encounter: Payer: Self-pay | Admitting: *Deleted

## 2018-08-06 LAB — CULTURE, URINE COMPREHENSIVE

## 2018-08-10 ENCOUNTER — Other Ambulatory Visit: Payer: Self-pay

## 2018-08-10 ENCOUNTER — Other Ambulatory Visit
Admission: RE | Admit: 2018-08-10 | Discharge: 2018-08-10 | Disposition: A | Discharge status: Home / Self Care | Payer: Medicare Other | Source: Ambulatory Visit | Attending: Podiatry | Admitting: Podiatry

## 2018-08-10 ENCOUNTER — Telehealth: Payer: Self-pay | Admitting: Family Medicine

## 2018-08-10 DIAGNOSIS — Z1159 Encounter for screening for other viral diseases: Secondary | ICD-10-CM | POA: Insufficient documentation

## 2018-08-10 LAB — SARS CORONAVIRUS 2 (TAT 6-24 HRS): SARS Coronavirus 2: NEGATIVE

## 2018-08-10 NOTE — Telephone Encounter (Signed)
-----   Message from Nori Riis, PA-C sent at 08/10/2018  7:31 AM EDT ----- Please let Mrs. Steele know that her urine culture was positive for infection and that the trimethoprim/Sulfa antibiotic that was prescribed for her is the appropriate antibiotic.  We will need to recheck her urine next week to make sure that the microscopic blood has cleared after her UTI has been treated.

## 2018-08-10 NOTE — Anesthesia Preprocedure Evaluation (Addendum)
Anesthesia Evaluation  Patient identified by MRN, date of birth, ID band Patient awake    Reviewed: Allergy & Precautions, NPO status , Patient's Chart, lab work & pertinent test results  History of Anesthesia Complications Negative for: history of anesthetic complications  Airway Mallampati: III   Neck ROM: Full    Dental no notable dental hx.    Pulmonary neg pulmonary ROS,    Pulmonary exam normal breath sounds clear to auscultation       Cardiovascular Normal cardiovascular exam+ dysrhythmias (PSVT)  Rhythm:Regular Rate:Normal  Palpitations    Neuro/Psych Chronic back and neck pain    GI/Hepatic GERD  ,  Endo/Other  negative endocrine ROS  Renal/GU negative Renal ROS     Musculoskeletal  (+) Arthritis ,   Abdominal   Peds  Hematology negative hematology ROS (+)   Anesthesia Other Findings   Reproductive/Obstetrics                            Anesthesia Physical Anesthesia Plan  ASA: II  Anesthesia Plan: General   Post-op Pain Management:    Induction: Intravenous  PONV Risk Score and Plan: 3 and Ondansetron, Dexamethasone and Scopolamine patch - Pre-op  Airway Management Planned: LMA  Additional Equipment:   Intra-op Plan:   Post-operative Plan: Extubation in OR  Informed Consent: I have reviewed the patients History and Physical, chart, labs and discussed the procedure including the risks, benefits and alternatives for the proposed anesthesia with the patient or authorized representative who has indicated his/her understanding and acceptance.       Plan Discussed with: CRNA  Anesthesia Plan Comments:        Anesthesia Quick Evaluation

## 2018-08-10 NOTE — Telephone Encounter (Signed)
Patient notified and scheduled appointment for UA drop off per [facility] to check micro hematuria.

## 2018-08-12 ENCOUNTER — Encounter
Admission: RE | Disposition: A | Discharge status: Home / Self Care | Payer: Self-pay | Source: Home / Self Care | Attending: Podiatry

## 2018-08-12 ENCOUNTER — Ambulatory Visit: Payer: Medicare Other | Admitting: Anesthesiology

## 2018-08-12 ENCOUNTER — Ambulatory Visit
Admission: RE | Admit: 2018-08-12 | Discharge: 2018-08-12 | Disposition: A | Discharge status: Home / Self Care | Payer: Medicare Other | Attending: Podiatry | Admitting: Podiatry

## 2018-08-12 DIAGNOSIS — Z79890 Hormone replacement therapy (postmenopausal): Secondary | ICD-10-CM | POA: Insufficient documentation

## 2018-08-12 DIAGNOSIS — G5762 Lesion of plantar nerve, left lower limb: Secondary | ICD-10-CM | POA: Diagnosis present

## 2018-08-12 DIAGNOSIS — K219 Gastro-esophageal reflux disease without esophagitis: Secondary | ICD-10-CM | POA: Insufficient documentation

## 2018-08-12 DIAGNOSIS — Z79899 Other long term (current) drug therapy: Secondary | ICD-10-CM | POA: Diagnosis not present

## 2018-08-12 HISTORY — DX: Motion sickness, initial encounter: T75.3XXA

## 2018-08-12 HISTORY — DX: Unspecified osteoarthritis, unspecified site: M19.90

## 2018-08-12 HISTORY — DX: Cardiac arrhythmia, unspecified: I49.9

## 2018-08-12 HISTORY — PX: EXCISION MORTON'S NEUROMA: SHX5013

## 2018-08-12 SURGERY — EXCISION, MORTON'S NEUROMA
Anesthesia: General | Site: Foot | Laterality: Left

## 2018-08-12 MED ORDER — HYDROCODONE-ACETAMINOPHEN 5-325 MG PO TABS: 1.0000 | ORAL_TABLET | Freq: Four times a day (QID) | ORAL | 0 refills | Status: DC | PRN

## 2018-08-12 MED ORDER — POVIDONE-IODINE 7.5 % EX SOLN
Freq: Once | CUTANEOUS | Status: AC
  Administered 2018-08-12: 12:00:00 via TOPICAL

## 2018-08-12 MED ORDER — FENTANYL CITRATE (PF) 100 MCG/2ML IJ SOLN
INTRAMUSCULAR | Status: DC | PRN
  Administered 2018-08-12: 50 ug via INTRAVENOUS

## 2018-08-12 MED ORDER — GLYCOPYRROLATE 0.2 MG/ML IJ SOLN
INTRAMUSCULAR | Status: DC | PRN
  Administered 2018-08-12: 0.1 mg via INTRAVENOUS

## 2018-08-12 MED ORDER — CEFAZOLIN SODIUM-DEXTROSE 2-4 GM/100ML-% IV SOLN
2.0000 g | INTRAVENOUS | Status: AC
  Administered 2018-08-12: 12:00:00 2 g via INTRAVENOUS

## 2018-08-12 MED ORDER — ONDANSETRON HCL 4 MG/2ML IJ SOLN
INTRAMUSCULAR | Status: DC | PRN
  Administered 2018-08-12: 4 mg via INTRAVENOUS

## 2018-08-12 MED ORDER — PROPOFOL 10 MG/ML IV BOLUS
INTRAVENOUS | Status: DC | PRN
  Administered 2018-08-12: 100 mg via INTRAVENOUS

## 2018-08-12 MED ORDER — LIDOCAINE HCL (CARDIAC) PF 100 MG/5ML IV SOSY
PREFILLED_SYRINGE | INTRAVENOUS | Status: DC | PRN
  Administered 2018-08-12: 50 mg via INTRATRACHEAL

## 2018-08-12 MED ORDER — ACETAMINOPHEN 10 MG/ML IV SOLN: 1000.0000 mg | Freq: Once | INTRAVENOUS | Status: DC | PRN

## 2018-08-12 MED ORDER — MIDAZOLAM HCL 5 MG/5ML IJ SOLN
INTRAMUSCULAR | Status: DC | PRN
  Administered 2018-08-12: 2 mg via INTRAVENOUS

## 2018-08-12 MED ORDER — SCOPOLAMINE 1 MG/3DAYS TD PT72
1.0000 | MEDICATED_PATCH | Freq: Once | TRANSDERMAL | Status: DC
  Administered 2018-08-12: 1.5 mg via TRANSDERMAL

## 2018-08-12 MED ORDER — FENTANYL CITRATE (PF) 100 MCG/2ML IJ SOLN: 25.0000 ug | INTRAMUSCULAR | Status: DC | PRN

## 2018-08-12 MED ORDER — OXYCODONE HCL 5 MG PO TABS: 5.0000 mg | ORAL_TABLET | Freq: Once | ORAL | Status: DC | PRN

## 2018-08-12 MED ORDER — OXYCODONE HCL 5 MG/5ML PO SOLN: 5.0000 mg | Freq: Once | ORAL | Status: DC | PRN

## 2018-08-12 MED ORDER — LIDOCAINE-EPINEPHRINE 1 %-1:100000 IJ SOLN
INTRAMUSCULAR | Status: DC | PRN
  Administered 2018-08-12: 2 mL

## 2018-08-12 MED ORDER — LACTATED RINGERS IV SOLN: INTRAVENOUS | Status: DC

## 2018-08-12 MED ORDER — DEXAMETHASONE SODIUM PHOSPHATE 4 MG/ML IJ SOLN
INTRAMUSCULAR | Status: DC | PRN
  Administered 2018-08-12: 4 mg via INTRAVENOUS

## 2018-08-12 MED ORDER — LACTATED RINGERS IV SOLN
INTRAVENOUS | Status: DC
  Administered 2018-08-12: 12:00:00 via INTRAVENOUS

## 2018-08-12 MED ORDER — DEXAMETHASONE SODIUM PHOSPHATE 4 MG/ML IJ SOLN
INTRAMUSCULAR | Status: DC | PRN
  Administered 2018-08-12: 4 mg

## 2018-08-12 MED ORDER — BUPIVACAINE HCL (PF) 0.5 % IJ SOLN
INTRAMUSCULAR | Status: DC | PRN
  Administered 2018-08-12: 10 mL

## 2018-08-12 MED ORDER — ONDANSETRON HCL 4 MG/2ML IJ SOLN: 4.0000 mg | Freq: Once | INTRAMUSCULAR | Status: DC | PRN

## 2018-08-12 SURGICAL SUPPLY — 30 items
BANDAGE ELASTIC 4 VELCRO NS (GAUZE/BANDAGES/DRESSINGS) ×2 IMPLANT
BNDG CMPR 75X41 PLY HI ABS (GAUZE/BANDAGES/DRESSINGS) ×1
BNDG COHESIVE 4X5 TAN STRL (GAUZE/BANDAGES/DRESSINGS) ×2 IMPLANT
BNDG ESMARK 4X12 TAN STRL LF (GAUZE/BANDAGES/DRESSINGS) ×2 IMPLANT
BNDG GAUZE 4.5X4.1 6PLY STRL (MISCELLANEOUS) ×2 IMPLANT
BNDG STRETCH 4X75 STRL LF (GAUZE/BANDAGES/DRESSINGS) ×2 IMPLANT
CANISTER SUCT 1200ML W/VALVE (MISCELLANEOUS) ×2 IMPLANT
COVER LIGHT HANDLE UNIVERSAL (MISCELLANEOUS) ×2 IMPLANT
DURAPREP 26ML APPLICATOR (WOUND CARE) ×2 IMPLANT
ELECT REM PT RETURN 9FT ADLT (ELECTROSURGICAL) ×2
ELECTRODE REM PT RTRN 9FT ADLT (ELECTROSURGICAL) ×1 IMPLANT
GAUZE SPONGE 4X4 12PLY STRL (GAUZE/BANDAGES/DRESSINGS) ×2 IMPLANT
GAUZE XEROFORM 1X8 LF (GAUZE/BANDAGES/DRESSINGS) ×1 IMPLANT
GLOVE BIO SURGEON STRL SZ7.5 (GLOVE) ×2 IMPLANT
GLOVE INDICATOR 8.0 STRL GRN (GLOVE) ×3 IMPLANT
GOWN STRL REUS W/ TWL LRG LVL3 (GOWN DISPOSABLE) ×2 IMPLANT
GOWN STRL REUS W/TWL LRG LVL3 (GOWN DISPOSABLE) ×4
NDL HYPO 25GX1X1/2 BEV (NEEDLE) ×2 IMPLANT
NEEDLE HYPO 25GX1X1/2 BEV (NEEDLE) ×4 IMPLANT
NS IRRIG 500ML POUR BTL (IV SOLUTION) ×2 IMPLANT
PACK EXTREMITY ARMC (MISCELLANEOUS) ×2 IMPLANT
PENCIL SMOKE EVACUATOR (MISCELLANEOUS) ×2 IMPLANT
STOCKINETTE IMPERVIOUS LG (DRAPES) ×2 IMPLANT
STRIP CLOSURE SKIN 1/4X4 (GAUZE/BANDAGES/DRESSINGS) ×2 IMPLANT
SUT ETHILON 5-0 FS-2 18 BLK (SUTURE) ×1 IMPLANT
SUT MNCRL+ 5-0 UNDYED PC-3 (SUTURE) ×1 IMPLANT
SUT MONOCRYL 5-0 (SUTURE) ×1
SUT VIC AB 4-0 FS2 27 (SUTURE) ×2 IMPLANT
SWABSTK COMLB BENZOIN TINCTURE (MISCELLANEOUS) ×2 IMPLANT
SYR 10ML LL (SYRINGE) ×2 IMPLANT

## 2018-08-12 NOTE — Op Note (Signed)
Operative note   Surgeon:Quindarrius Joplin Lawyer: None    Preop diagnosis: Neuroma left second webspace    Postop diagnosis: Same    Procedure: Excision neuroma left second webspace    EBL: Minimal    Anesthesia:local and general.  Local consisted of 10 cc of 0.5% bupivacaine plain and 2 cc of 1% lidocaine with epinephrine entered along the incision site    Hemostasis: Lidocaine with epinephrine infiltrated along the incision site    Specimen: Second webspace neuroma left foot    Complications: None    Operative indications:Shannon [name] Penrose is an 68 y.o. that presents today for surgical intervention.  The risks/benefits/alternatives/complications have been discussed and consent has been given.    Procedure:  Patient was brought into the OR and placed on the operating table in thesupine position. After anesthesia was obtained theleft lower extremity was prepped and draped in usual sterile fashion.  Attention was directed to the dorsal second intermetatarsal space and webspace where a webspace splitting incision was performed.  Blunt dissection carried down to the deep transverse intermetatarsal ligament and this was then transected.  The deeper web space was then entered.  At this point a prominent neuroma was noted.  The 2 arms to the second and third toes were dissected out and transected.  Further dissection was taken to the metatarsal head region.  Deep dissection was performed in the neuroma was then transected proximally.  This was sent for pathological examination.  The wound was flushed with copious amounts of irrigation.  Closure was then performed with a 4-0 Vicryl the deeper layer 5-0 Monocryl for the subcutaneous tissue and a 5-0 nylon for the skin.  4 mg of dexamethasone was infiltrated along the neuroma site.  A bulky sterile dressing was then applied.  Patient tolerated the procedure and anesthesia well.  Prescription for Norco was sent to the pharmacy.    Patient  tolerated the procedure and anesthesia well.  Was transported from the OR to the PACU with all vital signs stable and vascular status intact. To be discharged per routine protocol.  Will follow up in approximately 1 week in the outpatient clinic.

## 2018-08-12 NOTE — Discharge Instructions (Signed)
[address] [place]   1. Take your medication as prescribed.  Pain medication should be taken only as needed.  2. Keep the dressing clean, dry and intact.  3. Keep your foot elevated above the heart level for the first 48 hours.  4. Walking to the bathroom and brief periods of walking are acceptable, unless we have instructed you to be non-weight bearing.  5. Always wear your post-op shoe when walking.  Always use your crutches if you are to be non-weight bearing.  6. Do not take a shower. Baths are permissible as long as the foot is kept out of the water.   7. Every hour you are awake:  - Bend your knee 15 times. - Flex foot 15 times - Massage calf 15 times  8. Call [facility] [phone]) if any of the following problems occur: - You develop a temperature or fever. - The bandage becomes saturated with blood. - Medication does not stop your pain. - Injury of the foot occurs. - Any symptoms of infection including redness, odor, or red streaks running from wound.  Scopolamine skin patches REMOVE PATCH IN 72 HOURS AND Shannon [name] HANDS IMMEDIATELY  What is this medicine? SCOPOLAMINE (skoe POL a meen) is used to prevent nausea and vomiting caused by motion sickness, anesthesia and surgery. This medicine may be used for other purposes; ask your health care provider or pharmacist if you have questions. COMMON BRAND NAME(S): Transderm Scop What should I tell my health care provider before I take this medicine? They need to know if you have any of these conditions:  are scheduled to have a gastric secretion test  glaucoma  heart disease  kidney disease  liver disease  lung or breathing disease, like asthma  mental illness  prostate disease  seizures  stomach or intestine problems  trouble passing urine  an unusual or allergic  reaction to scopolamine, atropine, other medicines, foods, dyes, or preservatives  pregnant or trying to get pregnant  breast-feeding How should I use this medicine? This medicine is for external use only. Follow the directions on the prescription label. Wear only 1 patch at a time. Choose an area behind the ear, that is clean, dry, hairless and free from any cuts or irritation. Wipe the area with a clean dry tissue. Peel off the plastic backing of the skin patch, trying not to touch the adhesive side with your hands. Do not cut the patches. Firmly apply to the area you have chosen, with the metallic side of the patch to the skin and the tan-colored side showing. Once firmly in place, wash your hands well with soap and water. Do not get this medicine into your eyes. After removing the patch, wash your hands and the area behind your ear thoroughly with soap and water. The patch will still contain some medicine after use. To avoid accidental contact or ingestion by children or pets, fold the used patch in half with the sticky side together and throw away in the trash out of the reach of children and pets. If you need to use a second patch after you remove the first, place it behind the other ear. A special MedGuide will be given to you by the pharmacist with each prescription and refill. Be sure to read this information carefully each time. Talk to your pediatrician regarding the use of this medicine in children. Special care may  be needed. Overdosage: If you think you have taken too much of this medicine contact a poison control center or emergency room at once. NOTE: This medicine is only for you. Do not share this medicine with others. What if I miss a dose? This does not apply. This medicine is not for regular use. What may interact with this medicine?  alcohol  antihistamines for allergy cough and cold  atropine  certain medicines for anxiety or sleep  certain medicines for bladder  problems like oxybutynin, tolterodine  certain medicines for depression like amitriptyline, fluoxetine, sertraline  certain medicines for stomach problems like dicyclomine, hyoscyamine  certain medicines for Parkinson's disease like benztropine, trihexyphenidyl  certain medicines for seizures like phenobarbital, primidone  general anesthetics like halothane, isoflurane, methoxyflurane, propofol  ipratropium  local anesthetics like lidocaine, pramoxine, tetracaine  medicines that relax muscles for surgery  phenothiazines like chlorpromazine, mesoridazine, prochlorperazine, thioridazine  narcotic medicines for pain  other belladonna alkaloids This list may not describe all possible interactions. Give your health care provider a list of all the medicines, herbs, non-prescription drugs, or dietary supplements you use. Also tell them if you smoke, drink alcohol, or use illegal drugs. Some items may interact with your medicine. What should I watch for while using this medicine? Limit contact with water while swimming and bathing because the patch may fall off. If the patch falls off, throw it away and put a new one behind the other ear. You may get drowsy or dizzy. Do not drive, use machinery, or do anything that needs mental alertness until you know how this medicine affects you. Do not stand or sit up quickly, especially if you are an older patient. This reduces the risk of dizzy or fainting spells. Alcohol may interfere with the effect of this medicine. Avoid alcoholic drinks. Your mouth may get dry. Chewing sugarless gum or sucking hard candy, and drinking plenty of water may help. Contact your healthcare professional if the problem does not go away or is severe. This medicine may cause dry eyes and blurred vision. If you wear contact lenses, you may feel some discomfort. Lubricating drops may help. See your healthcare professional if the problem does not go away or is severe. If you are  going to need surgery, an MRI, CT scan, or other procedure, tell your healthcare professional that you are using this medicine. You may need to remove the patch before the procedure. What side effects may I notice from receiving this medicine? Side effects that you should report to your doctor or health care professional as soon as possible:  allergic reactions like skin rash, itching or hives; swelling of the face, lips, or tongue  blurred vision  changes in vision  confusion  dizziness  eye pain  fast, irregular heartbeat  hallucinations, loss of contact with reality  nausea, vomiting  pain or trouble passing urine  restlessness  seizures  skin irritation  stomach pain Side effects that usually do not require medical attention (report to your doctor or health care professional if they continue or are bothersome):  drowsiness  dry mouth  headache  sore throat This list may not describe all possible side effects. Call your doctor for medical advice about side effects. You may report side effects to FDA at 1-800-FDA-1088. Where should I keep my medicine? Keep out of the reach of children. Store at room temperature between 20 and 25 degrees C (68 and 77 degrees F). Keep this medicine in the foil package until ready  to use. Throw away any unused medicine after the expiration date. NOTE: This sheet is a summary. It may not cover all possible information. If you have questions about this medicine, talk to your doctor, pharmacist, or health care provider.  2020 Elsevier/Gold Standard (2017-03-28 16:14:46)   General Anesthesia, Adult, Care After This sheet gives you information about how to care for yourself after your procedure. Your health care provider may also give you more specific instructions. If you have problems or questions, contact your health care provider. What can I expect after the procedure? After the procedure, the following side effects are  common:  Pain or discomfort at the IV site.  Nausea.  Vomiting.  Sore throat.  Trouble concentrating.  Feeling cold or chills.  Weak or tired.  Sleepiness and fatigue.  Soreness and body aches. These side effects can affect parts of the body that were not involved in surgery. Follow these instructions at home:  For at least 24 hours after the procedure:  Have a responsible adult stay with you. It is important to have someone help care for you until you are awake and alert.  Rest as needed.  Do not: ? Participate in activities in which you could fall or become injured. ? Drive. ? Use heavy machinery. ? Drink alcohol. ? Take sleeping pills or medicines that cause drowsiness. ? Make important decisions or sign legal documents. ? Take care of children on your own. Eating and drinking  Follow any instructions from your health care provider about eating or drinking restrictions.  When you feel hungry, start by eating small amounts of foods that are soft and easy to digest (bland), such as toast. Gradually return to your regular diet.  Drink enough fluid to keep your urine pale yellow.  If you vomit, rehydrate by drinking water, juice, or clear broth. General instructions  If you have sleep apnea, surgery and certain medicines can increase your risk for breathing problems. Follow instructions from your health care provider about wearing your sleep device: ? Anytime you are sleeping, including during daytime naps. ? While taking prescription pain medicines, sleeping medicines, or medicines that make you drowsy.  Return to your normal activities as told by your health care provider. Ask your health care provider what activities are safe for you.  Take over-the-counter and prescription medicines only as told by your health care provider.  If you smoke, do not smoke without supervision.  Keep all follow-up visits as told by your health care provider. This is  important. Contact a health care provider if:  You have nausea or vomiting that does not get better with medicine.  You cannot eat or drink without vomiting.  You have pain that does not get better with medicine.  You are unable to pass urine.  You develop a skin rash.  You have a fever.  You have redness around your IV site that gets worse. Get help right away if:  You have difficulty breathing.  You have chest pain.  You have blood in your urine or stool, or you vomit blood. Summary  After the procedure, it is common to have a sore throat or nausea. It is also common to feel tired.  Have a responsible adult stay with you for the first 24 hours after general anesthesia. It is important to have someone help care for you until you are awake and alert.  When you feel hungry, start by eating small amounts of foods that are soft and easy to  digest (bland), such as toast. Gradually return to your regular diet.  Drink enough fluid to keep your urine pale yellow.  Return to your normal activities as told by your health care provider. Ask your health care provider what activities are safe for you. This information is not intended to replace advice given to you by your health care provider. Make sure you discuss any questions you have with your health care provider. Document Released: 04/15/2000 Document Revised: 01/10/2017 Document Reviewed: 08/23/2016 Elsevier Patient Education  2020 [employer].

## 2018-08-12 NOTE — H&P (Signed)
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  The various methods of treatment have been discussed with the patient.  No guarantees were given.  After consideration of risks, benefits and other options for treatment, the patient has consented to surgery.  I have reviewed the patients' chart and labs.     [name] history and physical examination was performed in my office.  The patient was reexamined.  There have been no changes to this history and physical examination.  Shannon [name]

## 2018-08-12 NOTE — Anesthesia Postprocedure Evaluation (Signed)
Anesthesia Post Note  Patient: Shannon [name]  Procedure(s) Performed: EXCISION MORTON'S NEUROMA (Left Foot)  Patient location during evaluation: PACU Anesthesia Type: General Level of consciousness: awake and alert, oriented and patient cooperative Pain management: pain level controlled Vital Signs Assessment: post-procedure vital signs reviewed and stable Respiratory status: spontaneous breathing, nonlabored ventilation and respiratory function stable Cardiovascular status: blood pressure returned to baseline and stable Postop Assessment: adequate PO intake Anesthetic complications: no    Darrin Nipper

## 2018-08-12 NOTE — Transfer of Care (Signed)
Immediate Anesthesia Transfer of Care Note  Patient: Shannon [name]  Procedure(s) Performed: EXCISION MORTON'S NEUROMA (Left Foot)  Patient Location: PACU  Anesthesia Type: General  Level of Consciousness: awake, alert  and patient cooperative  Airway and Oxygen Therapy: Patient Spontanous Breathing and Patient connected to supplemental oxygen  Post-op Assessment: Post-op Vital signs reviewed, Patient's Cardiovascular Status Stable, Respiratory Function Stable, Patent Airway and No signs of Nausea or vomiting  Post-op Vital Signs: Reviewed and stable  Complications: No apparent anesthesia complications

## 2018-08-12 NOTE — Progress Notes (Signed)
Third toe on left foot (operative foot) is purple, blanchable. Dr. Vickki Muff informed & MD came to the bedside to assess. No new orders received. Per Dr. Vickki Muff, pt OK to discharge home.

## 2018-08-12 NOTE — Anesthesia Procedure Notes (Signed)
Procedure Name: LMA Insertion Date/Time: 08/12/2018 12:16 PM Performed by: Mayme Genta, CRNA Pre-anesthesia Checklist: Patient identified, Emergency Drugs available, Suction available, Timeout performed and Patient being monitored Patient Re-evaluated:Patient Re-evaluated prior to induction Oxygen Delivery Method: Circle system utilized Preoxygenation: Pre-oxygenation with 100% oxygen Induction Type: IV induction LMA: LMA inserted LMA Size: 4.0 Number of attempts: 1 Placement Confirmation: positive ETCO2 and breath sounds checked- equal and bilateral Tube secured with: Tape

## 2018-08-13 ENCOUNTER — Encounter: Payer: Self-pay | Admitting: Podiatry

## 2018-08-14 LAB — SURGICAL PATHOLOGY

## 2018-08-20 ENCOUNTER — Other Ambulatory Visit: Payer: Self-pay

## 2018-08-20 DIAGNOSIS — R3129 Other microscopic hematuria: Secondary | ICD-10-CM

## 2018-08-21 ENCOUNTER — Other Ambulatory Visit: Payer: Self-pay

## 2018-08-21 ENCOUNTER — Other Ambulatory Visit: Payer: Medicare Other

## 2018-08-21 DIAGNOSIS — R3129 Other microscopic hematuria: Secondary | ICD-10-CM

## 2018-08-21 LAB — URINALYSIS, COMPLETE
Bilirubin, UA: NEGATIVE
Glucose, UA: NEGATIVE
Ketones, UA: NEGATIVE
Leukocytes,UA: NEGATIVE
Nitrite, UA: NEGATIVE
Protein,UA: NEGATIVE
RBC, UA: NEGATIVE
Specific Gravity, UA: 1.025 (ref 1.005–1.030)
Urobilinogen, Ur: 0.2 mg/dL (ref 0.2–1.0)
pH, UA: 5 (ref 5.0–7.5)

## 2018-08-21 LAB — MICROSCOPIC EXAMINATION
Bacteria, UA: NONE SEEN
RBC, Urine: NONE SEEN /HPF (ref 0–2)

## 2018-10-12 ENCOUNTER — Other Ambulatory Visit: Payer: Self-pay | Admitting: Internal Medicine

## 2018-10-27 NOTE — Progress Notes (Signed)
10/28/2018 7:59 AM   Shannon [name] 1950-09-05 [id number]  Referring provider: Abner Greenspan, MD [address] [place],  [place] [address]  Chief Complaint  Patient presents with  . Recurrent UTI    HPI: 68 y/o female with a personal history of recurrent UTI's, vaginal atrophy and urgency who presents today for a follow up.    Personal hx of rUTI's Risk factors: age, vaginal atrophy, constipation and incontinence. Documented infections:   + E.coli resistant to ampicillin on 12/21/2014  + E. Coli on 12/06/2015  + E. Coli on 08/04/2018  TVT Sling by gynecologist in 06/2015 during hysterectomy with anterior repair   Contrast CT in 11/2016 by ED for abdominal pain on 12/08/2016 noted bilateral renal parapelvic cysts. No hydronephrosis. Adrenal glands and urinary bladder unremarkable.  Moderate to large stool burden throughout the colon.   Asymptomatic at this visit.   Vaginal atrophy Using cream three nights weekly.    Urgency She is experiencing urgency x 0-3 (stable), frequency x 8 or more (stable), not restricting fluids to avoid visits to the restroom, is engaging in toilet mapping, incontinence x 0-3 (improved) and nocturia x 0-3 (stable).  Her main complaints today are frequency, urgency, nocturia and incontinence.  She denies any gross hematuria, dysuria or suprapubic pain. She is not any fevers, chills, nausea or vomiting. Her PVR today is 31 mL.  Her blood pressure is 134/74.    She feels the Myrbetriq 50 mg is helpful, but she still experiencing some urgency and incontinence.  She is going about 8 times daily and 3 times nightly even with the Myrbetriq.  This hasn't been an issue at this time, but she is wanting to get better control of her symptoms.  She enjoys traveling and would like to not have to visit the restroom often on trips.    PMH: Past Medical History:  Diagnosis Date  . Arthritis    left foot  . Blood transfusion without reported  diagnosis   . Chronic neck and back pain    received physical therapy  . Dysrhythmia    Pt told she had "benign arrythmia" after wearing monitor  . GERD (gastroesophageal reflux disease)    with esophagitis  . Motion sickness    boats, planes  . Numbness    Right outer thigh, constant, notices more with standing for a long period of time  . Plantar fasciitis    left    Surgical History: Past Surgical History:  Procedure Laterality Date  . BLADDER SUSPENSION N/A 07/04/2015   Procedure: TRANSVAGINAL TAPE (TVT) SLING                   ;  Surgeon: Emily Filbert, MD;  Location: [facility];  Service: Gynecology;  Laterality: N/A;  . COLONOSCOPY    . CYSTOCELE REPAIR N/A 07/04/2015   Procedure: ANTERIOR REPAIR (CYSTOCELE);  Surgeon: Emily Filbert, MD;  Location: [facility];  Service: Gynecology;  Laterality: N/A;  . CYSTOSCOPY N/A 07/04/2015   Procedure: CYSTOSCOPY;  Surgeon: Emily Filbert, MD;  Location: [facility];  Service: Gynecology;  Laterality: N/A;  . DILATION AND CURETTAGE OF UTERUS    . EXCISION MORTON'S NEUROMA Left 08/12/2018   Procedure: EXCISION MORTON'S NEUROMA;  Surgeon: Samara Deist, DPM;  Location: [facility];  Service: Podiatry;  Laterality: Left;  lma local  . POLYPECTOMY    . TUBAL LIGATION  1987  . UPPER GI ENDOSCOPY    .  VAGINAL HYSTERECTOMY N/A 07/04/2015   Procedure: TOTAL VAGINAL HYSTERECTOMY  ;  Surgeon: Emily Filbert, MD;  Location: [facility];  Service: Gynecology;  Laterality: N/A;    Home Medications:  Allergies as of 10/28/2018      Reactions   Adhesive [tape] Rash   (not sure if rash was from ointment or tape)   Alpha-gal Rash   (red meat S/P tick bite)   Neomycin-bacitracin Zn-polymyx Rash   Ointment      Medication List       Accurate as of October 28, 2018 11:59 PM. If you have any questions, ask your nurse or doctor.        STOP taking these medications   D-MANNOSE PO Stopped by: Zarahi Fuerst, PA-C   HYDROcodone-acetaminophen 5-325 MG tablet  Commonly known as: Norco Stopped by: Yehoshua Vitelli, PA-C   sulfamethoxazole-trimethoprim 800-160 MG tablet Commonly known as: BACTRIM DS Stopped by: Zara Council, PA-C     TAKE these medications   conjugated estrogens vaginal cream Commonly known as: PREMARIN Place vaginally 3 (three) times a week.   diclofenac sodium 1 % Gel Commonly known as: VOLTAREN Apply topically as needed.   famotidine 20 MG tablet Commonly known as: PEPCID Take 20 mg by mouth daily as needed for heartburn or indigestion.   GLUCOSAMINE CHONDROITIN COMPLX PO Take by mouth daily.   MAGNESIUM GLUCONATE PO Take by mouth daily.   Melatonin 3 MG Tabs Take by mouth at bedtime as needed.   mirabegron ER 50 MG Tb24 tablet Commonly known as: MYRBETRIQ Take 1 tablet (50 mg total) by mouth daily.   multivitamin tablet Take 1 tablet by mouth daily.   Eden Roc PRO-B PO Take by mouth daily.   St Johns Wort 300 MG Tabs Take by mouth.       Allergies:  Allergies  Allergen Reactions  . Adhesive [Tape] Rash    (not sure if rash was from ointment or tape)  . Alpha-Gal Rash    (red meat S/P tick bite)  . Neomycin-Bacitracin Zn-Polymyx Rash    Ointment    Family History: Family History  Problem Relation Age of Onset  . Stroke Mother        x 2  . Heart disease Mother        congenital arrhythmia and CHF  . Depression Mother   . Prostate cancer Brother   . Stomach cancer Paternal Uncle   . Colon cancer Neg Hx   . Esophageal cancer Neg Hx   . Kidney cancer Neg Hx   . Bladder Cancer Neg Hx     Social History:  reports that she has never smoked. She has never used smokeless tobacco. She reports that she does not drink alcohol or use drugs.  ROS: UROLOGY Frequent Urination?: Yes Hard to postpone urination?: Yes Burning/pain with urination?: No Get up at night to urinate?: Yes Leakage of urine?: Yes Urine stream starts and stops?: No Trouble starting stream?: No Do you have to  strain to urinate?: No Blood in urine?: No Urinary tract infection?: No Sexually transmitted disease?: No Injury to kidneys or bladder?: No Painful intercourse?: No Weak stream?: No Currently pregnant?: No Vaginal bleeding?: No Last menstrual period?: n  Gastrointestinal Nausea?: No Vomiting?: No Indigestion/heartburn?: No Diarrhea?: No Constipation?: No  Constitutional Fever: No Night sweats?: No Weight loss?: No Fatigue?: No  Skin Skin rash/lesions?: No Itching?: No  Eyes Blurred vision?: No Double vision?: No  Ears/Nose/Throat Sore throat?: No Sinus problems?: No  Hematologic/Lymphatic Swollen glands?: No Easy bruising?: No  Cardiovascular Leg swelling?: No Chest pain?: No  Respiratory Cough?: No Shortness of breath?: No  Endocrine Excessive thirst?: No  Musculoskeletal Back pain?: No Joint pain?: No  Neurological Headaches?: No Dizziness?: No  Psychologic Depression?: No Anxiety?: No  Physical Exam: BP 134/74 (BP Location: Left Arm, Patient Position: Sitting, Cuff Size: Normal)   Pulse 69   Ht 5\' 8"  (1.727 m)   Wt 162 lb (73.5 kg)   BMI 24.63 kg/m   Constitutional:  Well nourished. Alert and oriented, No acute distress. HEENT: [place] AT, moist mucus membranes.  Trachea midline, no masses. Cardiovascular: No clubbing, cyanosis, or edema. Respiratory: Normal respiratory effort, no increased work of breathing. GI: Abdomen is soft, non tender, non distended, no abdominal masses. Liver and spleen not palpable.  No hernias appreciated.  Stool sample for occult testing is not indicated.   GU: No CVA tenderness.  No bladder fullness or masses.  Normal external genitalia, normal pubic hair distribution, no lesions.  Normal urethral meatus, no lesions, no prolapse, no discharge.   No urethral masses, tenderness and/or tenderness. No bladder fullness, tenderness or masses. normal vagina mucosa, good estrogen effect, no discharge, no lesions, pessary in  place.  Uterus is surgically absent.  No adnexal/parametria masses or tenderness noted.  Anus and perineum are without rashes or lesions.    Skin: No rashes, bruises or suspicious lesions. Lymph: No cervical or inguinal adenopathy. Neurologic: Grossly intact, no focal deficits, moving all 4 extremities. Psychiatric: Normal mood and affect.   Laboratory Data: Lab Results  Component Value Date   WBC 4.9 12/24/2017   HGB 13.5 12/24/2017   HCT 40.7 12/24/2017   MCV 91.1 12/24/2017   PLT 254.0 12/24/2017    Lab Results  Component Value Date   CREATININE 0.69 12/24/2017    Lab Results  Component Value Date   TSH 0.69 12/24/2017       Component Value Date/Time   CHOL 158 12/24/2017 0932   HDL 72.90 12/24/2017 0932   CHOLHDL 2 12/24/2017 0932   VLDL 10.0 12/24/2017 0932   LDLCALC 75 12/24/2017 0932    Lab Results  Component Value Date   AST 18 12/24/2017   Lab Results  Component Value Date   ALT 19 12/24/2017   I have reviewed the labs  Pertinent Imaging: Results for DEEANDRA, HELING (MRN [id number]) as of 11/06/2018 08:06  Ref. Range 10/28/2018 10:11  Scan Result Unknown 31   Assessment & Plan:    1. History of recurrent UTI's Currently asymptomatic Taking probiotics and using vaginal estrogen cream   2. Vaginal atrophy She is on a estrogen cream three nights weekly  3. OAB/Urgency Continue Myrbetriq 50 mg daily Will schedule cystoscopy as she continues to have significant symptoms despite being on the Myrbetriq 50 mg daily I have explained to the patient that they will  be scheduled for a cystoscopy in our office to evaluate their bladder.  The cystoscopy consists of passing a tube with a lens up through their urethra and into their urinary bladder.   We will inject the urethra with a lidocaine gel prior to introducing the cystoscope to help with any discomfort during the procedure.   After the procedure, they might experience blood in the urine and  discomfort with urination.  This will abate after the first few voids.  I have  encouraged the patient to increase water intake  during this time.  Patient denies any allergies to  lidocaine.  RTC for cystoscopy                                             Return for cystoscopy for refractory urgency .  These notes generated with voice recognition software. I apologize for typographical errors.  Zara Council, PA-C  [facility] [address] [place],  [address] [phone]

## 2018-10-28 ENCOUNTER — Encounter: Payer: Self-pay | Admitting: Urology

## 2018-10-28 ENCOUNTER — Ambulatory Visit (INDEPENDENT_AMBULATORY_CARE_PROVIDER_SITE_OTHER): Payer: Medicare Other | Admitting: Urology

## 2018-10-28 ENCOUNTER — Other Ambulatory Visit: Payer: Self-pay

## 2018-10-28 VITALS — BP 134/74 | HR 69 | Ht 68.0 in | Wt 162.0 lb

## 2018-10-28 DIAGNOSIS — Z87440 Personal history of urinary (tract) infections: Secondary | ICD-10-CM | POA: Diagnosis not present

## 2018-10-28 DIAGNOSIS — N952 Postmenopausal atrophic vaginitis: Secondary | ICD-10-CM | POA: Diagnosis not present

## 2018-10-28 DIAGNOSIS — R3915 Urgency of urination: Secondary | ICD-10-CM | POA: Diagnosis not present

## 2018-10-28 LAB — BLADDER SCAN AMB NON-IMAGING: Scan Result: 31

## 2018-11-18 ENCOUNTER — Other Ambulatory Visit: Payer: Self-pay

## 2018-11-18 ENCOUNTER — Ambulatory Visit: Payer: Medicare Other | Admitting: Urology

## 2018-11-18 ENCOUNTER — Encounter: Payer: Self-pay | Admitting: Urology

## 2018-11-18 VITALS — BP 94/56 | HR 69 | Ht 68.0 in | Wt 158.0 lb

## 2018-11-18 DIAGNOSIS — Z87440 Personal history of urinary (tract) infections: Secondary | ICD-10-CM | POA: Diagnosis not present

## 2018-11-18 DIAGNOSIS — R3915 Urgency of urination: Secondary | ICD-10-CM

## 2018-11-18 NOTE — Progress Notes (Signed)
   11/18/18  CC:  Chief Complaint  Patient presents with  . Cysto    HPI: 68 year old emale with refractory OAB symptoms on Myrbetriq 50 mg who presents today for cystoscopic evaluation of her bladder to rule out any underlying pathology.  She is followed by Zara Council, please see previous notes for details.  Blood pressure (!) 94/56, pulse 69, height 5\' 8"  (1.727 m), weight 158 lb (71.7 kg). NED. A&Ox3.   No respiratory distress   Abd soft, NT, ND Normal external genitalia with patent urethral meatus  Cystoscopy Procedure Note  Patient identification was confirmed, informed consent was obtained, and patient was prepped using Betadine solution.  Lidocaine jelly was administered per urethral meatus.    Procedure: - Flexible cystoscope introduced, without any difficulty.   - Thorough search of the bladder revealed:    normal urethral meatus    normal urothelium    no stones    no ulcers     no tumors    no urethral polyps    no trabeculation  - Ureteral orifices were normal in position and appearance.  Post-Procedure: - Patient tolerated the procedure well  Assessment/ Plan:  1. Urgency of urination Bladder anatomically normal without pathology  Symptoms refractory to Myrbetriq 50 mg (some improvement).  She may be a good candidate for PTNS versus Botox.  We discussed Botox today in detail and she was given information on this.  She will return to discuss this further with [facility].  All questions answered.  2. History of recurrent UTIs Continue topical estrogen cream - Urinalysis, Complete    Return in about 2 weeks (around 12/02/2018) for MD follow up with Larene Beach.  Hollice Espy, MD

## 2018-11-19 LAB — URINALYSIS, COMPLETE
Bilirubin, UA: NEGATIVE
Glucose, UA: NEGATIVE
Ketones, UA: NEGATIVE
Nitrite, UA: NEGATIVE
Protein,UA: NEGATIVE
RBC, UA: NEGATIVE
Specific Gravity, UA: 1.025 (ref 1.005–1.030)
Urobilinogen, Ur: 0.2 mg/dL (ref 0.2–1.0)
pH, UA: 5.5 (ref 5.0–7.5)

## 2018-11-19 LAB — MICROSCOPIC EXAMINATION
RBC, Urine: NONE SEEN /hpf (ref 0–2)
WBC, UA: >30 /hpf — AB (ref 0–5)

## 2018-12-01 ENCOUNTER — Other Ambulatory Visit: Payer: Self-pay

## 2018-12-01 ENCOUNTER — Ambulatory Visit (INDEPENDENT_AMBULATORY_CARE_PROVIDER_SITE_OTHER): Payer: Medicare Other | Admitting: Family Medicine

## 2018-12-01 ENCOUNTER — Encounter: Payer: Self-pay | Admitting: Family Medicine

## 2018-12-01 VITALS — Temp 98.2°F | Wt 158.0 lb

## 2018-12-01 DIAGNOSIS — Z20828 Contact with and (suspected) exposure to other viral communicable diseases: Secondary | ICD-10-CM | POA: Diagnosis not present

## 2018-12-01 DIAGNOSIS — Z20822 Contact with and (suspected) exposure to covid-19: Secondary | ICD-10-CM

## 2018-12-01 NOTE — Progress Notes (Signed)
I connected with Shannon [name] on 12/01/18 at 11:20 AM EST by video and verified that I am speaking with the correct person using two identifiers.   I discussed the limitations, risks, security and privacy concerns of performing an evaluation and management service by video and the availability of in person appointments. I also discussed with the patient that there may be a patient responsible charge related to this service. The patient expressed understanding and agreed to proceed.  Patient location: Home Provider Location: Provider Home Office Participants: Lesleigh Noe and [facility] Ward Majkowski   Subjective:     Shannon [name] is a 68 y.o. female presenting for Fever (last night on 11/30/2018 100.5, headache, chills, fatigue. No cough or sore throat. )     Fever  This is a new problem. The current episode started yesterday. Associated symptoms include headaches and sleepiness. Pertinent negatives include no congestion, coughing, diarrhea, ear pain, muscle aches, nausea, sore throat, urinary pain, vomiting or wheezing. She has tried acetaminophen for the symptoms. The treatment provided mild relief.   Endorses heartburn No loss of sense of smell or taste Sick contact: no No large crowds or gatherings - only medical visits and the grocery store  Pepcid for the heartburn  Flu shot: yes  Still feels a little tired today, and fever has resolved  Review of Systems  Constitutional: Positive for chills, fatigue and fever.  HENT: Negative for congestion, ear pain and sore throat.   Respiratory: Negative for cough and wheezing.   Gastrointestinal: Negative for diarrhea, nausea and vomiting.  Genitourinary: Negative for dysuria.  Neurological: Positive for headaches.     Social History   Tobacco Use  Smoking Status Never Smoker  Smokeless Tobacco Never Used        Objective:   BP Readings from Last 3 Encounters:  11/18/18 (!) 94/56  10/28/18 134/74   08/12/18 138/84   Wt Readings from Last 3 Encounters:  12/01/18 158 lb (71.7 kg)  11/18/18 158 lb (71.7 kg)  10/28/18 162 lb (73.5 kg)   Temp 98.2 F (36.8 C) Comment: per patient today  Wt 158 lb (71.7 kg) Comment: per patient  BMI 24.02 kg/m    Physical Exam Constitutional:      Appearance: Normal appearance. She is not ill-appearing.  HENT:     Head: Normocephalic and atraumatic.     Right Ear: External ear normal.     Left Ear: External ear normal.  Eyes:     Conjunctiva/sclera: Conjunctivae normal.  Pulmonary:     Effort: Pulmonary effort is normal. No respiratory distress.  Neurological:     Mental Status: She is alert. Mental status is at baseline.  Psychiatric:        Mood and Affect: Mood normal.        Behavior: Behavior normal.        Thought Content: Thought content normal.        Judgment: Judgment normal.             Assessment & Plan:   Problem List Items Addressed This Visit    None    Visit Diagnoses    Suspected COVID-19 virus infection    -  Primary     Discussed OTC treatment for viral illness Given symptoms possible covid-19 and would recommend being tested ER precautions given Advised staying out of work and isolating until results have come back PT will go to [facility] for testing today    Return  if symptoms worsen or fail to improve.  Lesleigh Noe, MD

## 2018-12-03 ENCOUNTER — Ambulatory Visit: Payer: Medicare Other | Admitting: Urology

## 2018-12-03 LAB — NOVEL CORONAVIRUS, NAA: SARS-CoV-2, NAA: NOT DETECTED

## 2018-12-14 ENCOUNTER — Telehealth: Payer: Medicare Other | Admitting: Urology

## 2018-12-23 ENCOUNTER — Encounter: Payer: Self-pay | Admitting: *Deleted

## 2018-12-23 ENCOUNTER — Other Ambulatory Visit: Payer: Self-pay

## 2018-12-24 ENCOUNTER — Other Ambulatory Visit: Payer: Self-pay | Admitting: Podiatry

## 2018-12-25 ENCOUNTER — Other Ambulatory Visit
Admission: RE | Admit: 2018-12-25 | Discharge: 2018-12-25 | Disposition: A | Discharge status: Home / Self Care | Payer: Medicare Other | Source: Ambulatory Visit | Attending: Podiatry | Admitting: Podiatry

## 2018-12-25 DIAGNOSIS — Z20828 Contact with and (suspected) exposure to other viral communicable diseases: Secondary | ICD-10-CM | POA: Insufficient documentation

## 2018-12-25 DIAGNOSIS — Z01812 Encounter for preprocedural laboratory examination: Secondary | ICD-10-CM | POA: Insufficient documentation

## 2018-12-25 LAB — SARS CORONAVIRUS 2 (TAT 6-24 HRS): SARS Coronavirus 2: NEGATIVE

## 2018-12-25 NOTE — Discharge Instructions (Signed)
[place] DR. TROXLER, DR. Vickki Muff, AND DR. Rensselaer   1. Take your medication as prescribed.  Pain medication should be taken only as needed.  2. Keep the dressing clean, dry and intact.  3. Keep your foot elevated above the heart level for the first 48 hours.  4. Walking to the bathroom and brief periods of walking are acceptable, unless we have instructed you to be non-weight bearing.  5. Always wear your post-op shoe when walking.  Always use your crutches if you are to be non-weight bearing.  6. Do not take a shower. Baths are permissible as long as the foot is kept out of the water.   7. Every hour you are awake:  - Bend your knee 15 times. - Flex foot 15 times - Massage calf 15 times  8. Call [facility] [phone]) if any of the following problems occur: - You develop a temperature or fever. - The bandage becomes saturated with blood. - Medication does not stop your pain. - Injury of the foot occurs. - Any symptoms of infection including redness, odor, or red streaks running from wound.   General Anesthesia, Adult, Care After This sheet gives you information about how to care for yourself after your procedure. Your health care provider may also give you more specific instructions. If you have problems or questions, contact your health care provider. What can I expect after the procedure? After the procedure, the following side effects are common:  Pain or discomfort at the IV site.  Nausea.  Vomiting.  Sore throat.  Trouble concentrating.  Feeling cold or chills.  Weak or tired.  Sleepiness and fatigue.  Soreness and body aches. These side effects can affect parts of the body that were not involved in surgery. Follow these instructions at home:  For at least 24 hours after the procedure:  Have a responsible adult stay with you. It is  important to have someone help care for you until you are awake and alert.  Rest as needed.  Do not: ? Participate in activities in which you could fall or become injured. ? Drive. ? Use heavy machinery. ? Drink alcohol. ? Take sleeping pills or medicines that cause drowsiness. ? Make important decisions or sign legal documents. ? Take care of children on your own. Eating and drinking  Follow any instructions from your health care provider about eating or drinking restrictions.  When you feel hungry, start by eating small amounts of foods that are soft and easy to digest (bland), such as toast. Gradually return to your regular diet.  Drink enough fluid to keep your urine pale yellow.  If you vomit, rehydrate by drinking water, juice, or clear broth. General instructions  If you have sleep apnea, surgery and certain medicines can increase your risk for breathing problems. Follow instructions from your health care provider about wearing your sleep device: ? Anytime you are sleeping, including during daytime naps. ? While taking prescription pain medicines, sleeping medicines, or medicines that make you drowsy.  Return to your normal activities as told by your health care provider. Ask your health care provider what activities are safe for you.  Take over-the-counter and prescription medicines only as told by your health care provider.  If you smoke, do not smoke without supervision.  Keep all follow-up visits as told by your health care provider. This is important. Contact a health care provider if:  You have nausea or vomiting that does not get better with medicine.  You cannot eat or drink without vomiting.  You have pain that does not get better with medicine.  You are unable to pass urine.  You develop a skin rash.  You have a fever.  You have redness around your IV site that gets worse. Get help right away if:  You have difficulty breathing.  You have chest  pain.  You have blood in your urine or stool, or you vomit blood. Summary  After the procedure, it is common to have a sore throat or nausea. It is also common to feel tired.  Have a responsible adult stay with you for the first 24 hours after general anesthesia. It is important to have someone help care for you until you are awake and alert.  When you feel hungry, start by eating small amounts of foods that are soft and easy to digest (bland), such as toast. Gradually return to your regular diet.  Drink enough fluid to keep your urine pale yellow.  Return to your normal activities as told by your health care provider. Ask your health care provider what activities are safe for you. This information is not intended to replace advice given to you by your health care provider. Make sure you discuss any questions you have with your health care provider. Document Released: 04/15/2000 Document Revised: 01/10/2017 Document Reviewed: 08/23/2016 Elsevier Patient Education  2020 [employer].

## 2018-12-30 ENCOUNTER — Ambulatory Visit: Payer: Medicare Other

## 2018-12-30 ENCOUNTER — Ambulatory Visit: Payer: Medicare Other | Admitting: Anesthesiology

## 2018-12-30 ENCOUNTER — Ambulatory Visit
Admission: RE | Admit: 2018-12-30 | Discharge: 2018-12-30 | Disposition: A | Discharge status: Home / Self Care | Payer: Medicare Other | Attending: Podiatry | Admitting: Podiatry

## 2018-12-30 ENCOUNTER — Encounter
Admission: RE | Disposition: A | Discharge status: Home / Self Care | Payer: Self-pay | Source: Home / Self Care | Attending: Podiatry

## 2018-12-30 ENCOUNTER — Other Ambulatory Visit: Payer: Self-pay

## 2018-12-30 DIAGNOSIS — I471 Supraventricular tachycardia: Secondary | ICD-10-CM | POA: Diagnosis not present

## 2018-12-30 DIAGNOSIS — K219 Gastro-esophageal reflux disease without esophagitis: Secondary | ICD-10-CM | POA: Diagnosis not present

## 2018-12-30 DIAGNOSIS — M19072 Primary osteoarthritis, left ankle and foot: Secondary | ICD-10-CM | POA: Diagnosis present

## 2018-12-30 DIAGNOSIS — Z79899 Other long term (current) drug therapy: Secondary | ICD-10-CM | POA: Diagnosis not present

## 2018-12-30 HISTORY — PX: ARTHRODESIS METATARSAL: SHX6565

## 2018-12-30 SURGERY — FUSION, JOINT, INVOLVING METATARSAL BONE
Anesthesia: General | Site: Toe | Laterality: Left

## 2018-12-30 MED ORDER — ONDANSETRON HCL 4 MG/2ML IJ SOLN
INTRAMUSCULAR | Status: DC | PRN
  Administered 2018-12-30: 4 mg via INTRAVENOUS

## 2018-12-30 MED ORDER — MIDAZOLAM HCL 5 MG/5ML IJ SOLN
INTRAMUSCULAR | Status: DC | PRN
  Administered 2018-12-30: 2 mg via INTRAVENOUS

## 2018-12-30 MED ORDER — CEFAZOLIN SODIUM-DEXTROSE 2-4 GM/100ML-% IV SOLN
2.0000 g | INTRAVENOUS | Status: AC
  Administered 2018-12-30: 12:00:00 2 g via INTRAVENOUS

## 2018-12-30 MED ORDER — BUPIVACAINE-EPINEPHRINE (PF) 0.5% -1:200000 IJ SOLN
INTRAMUSCULAR | Status: DC | PRN
  Administered 2018-12-30: 10 mL

## 2018-12-30 MED ORDER — DEXAMETHASONE SODIUM PHOSPHATE 4 MG/ML IJ SOLN
INTRAMUSCULAR | Status: DC | PRN
  Administered 2018-12-30: 4 mg via INTRAVENOUS

## 2018-12-30 MED ORDER — OXYCODONE HCL 5 MG PO TABS: 5.0000 mg | ORAL_TABLET | Freq: Once | ORAL | Status: AC | PRN

## 2018-12-30 MED ORDER — FENTANYL CITRATE (PF) 100 MCG/2ML IJ SOLN: 25.0000 ug | INTRAMUSCULAR | Status: DC | PRN

## 2018-12-30 MED ORDER — ACETAMINOPHEN 325 MG PO TABS: 325.0000 mg | ORAL_TABLET | ORAL | Status: DC | PRN

## 2018-12-30 MED ORDER — LIDOCAINE HCL (CARDIAC) PF 100 MG/5ML IV SOSY
PREFILLED_SYRINGE | INTRAVENOUS | Status: DC | PRN
  Administered 2018-12-30: 30 mg via INTRATRACHEAL

## 2018-12-30 MED ORDER — ACETAMINOPHEN 160 MG/5ML PO SOLN: 325.0000 mg | ORAL | Status: DC | PRN

## 2018-12-30 MED ORDER — GLYCOPYRROLATE 0.2 MG/ML IJ SOLN
INTRAMUSCULAR | Status: DC | PRN
  Administered 2018-12-30: 0.1 mg via INTRAVENOUS

## 2018-12-30 MED ORDER — LACTATED RINGERS IV SOLN
INTRAVENOUS | Status: DC
  Administered 2018-12-30 (×2): via INTRAVENOUS

## 2018-12-30 MED ORDER — BUPIVACAINE HCL (PF) 0.5 % IJ SOLN: INTRAMUSCULAR | Status: DC | PRN

## 2018-12-30 MED ORDER — PROPOFOL 10 MG/ML IV BOLUS
INTRAVENOUS | Status: DC | PRN
  Administered 2018-12-30: 120 mg via INTRAVENOUS

## 2018-12-30 MED ORDER — OXYCODONE HCL 5 MG/5ML PO SOLN
5.0000 mg | Freq: Once | ORAL | Status: AC | PRN
  Administered 2018-12-30: 5 mg via ORAL

## 2018-12-30 MED ORDER — BUPIVACAINE LIPOSOME 1.3 % IJ SUSP
INTRAMUSCULAR | Status: DC | PRN
  Administered 2018-12-30: 15 mL

## 2018-12-30 MED ORDER — SCOPOLAMINE 1 MG/3DAYS TD PT72
1.0000 | MEDICATED_PATCH | Freq: Once | TRANSDERMAL | Status: DC
  Administered 2018-12-30: 10:00:00 1.5 mg via TRANSDERMAL

## 2018-12-30 MED ORDER — POVIDONE-IODINE 7.5 % EX SOLN
Freq: Once | CUTANEOUS | Status: AC
  Administered 2018-12-30: 12:00:00 via TOPICAL

## 2018-12-30 MED ORDER — OXYCODONE-ACETAMINOPHEN 5-325 MG PO TABS: 1.0000 | ORAL_TABLET | ORAL | 0 refills | Status: DC | PRN

## 2018-12-30 MED ORDER — FENTANYL CITRATE (PF) 100 MCG/2ML IJ SOLN
INTRAMUSCULAR | Status: DC | PRN
  Administered 2018-12-30 (×2): 25 ug via INTRAVENOUS
  Administered 2018-12-30: 50 ug via INTRAVENOUS

## 2018-12-30 SURGICAL SUPPLY — 64 items
APL SKNCLS STERI-STRIP NONHPOA (GAUZE/BANDAGES/DRESSINGS) ×1
BENZOIN TINCTURE PRP APPL 2/3 (GAUZE/BANDAGES/DRESSINGS) ×2 IMPLANT
BLADE MED AGGRESSIVE (BLADE) IMPLANT
BLADE OSC/SAGITTAL MD 5.5X18 (BLADE) IMPLANT
BLADE SURG 15 STRL LF DISP TIS (BLADE) IMPLANT
BLADE SURG 15 STRL SS (BLADE)
BNDG CMPR 75X41 PLY HI ABS (GAUZE/BANDAGES/DRESSINGS) ×1
BNDG COHESIVE 4X5 TAN STRL (GAUZE/BANDAGES/DRESSINGS) ×2 IMPLANT
BNDG CONFORM 3 STRL LF (GAUZE/BANDAGES/DRESSINGS) ×1 IMPLANT
BNDG ELASTIC 4X5.8 VLCR STR LF (GAUZE/BANDAGES/DRESSINGS) ×2 IMPLANT
BNDG ESMARK 4X12 TAN STRL LF (GAUZE/BANDAGES/DRESSINGS) ×2 IMPLANT
BNDG GAUZE 4.5X4.1 6PLY STRL (MISCELLANEOUS) ×1 IMPLANT
BNDG STRETCH 4X75 STRL LF (GAUZE/BANDAGES/DRESSINGS) ×2 IMPLANT
BUR SIDE CUT 44.8 STRL (BURR) IMPLANT
BURR SIDE CUT 44.8 STRL (BURR) ×2
CANISTER SUCT 1200ML W/VALVE (MISCELLANEOUS) ×2 IMPLANT
COVER LIGHT HANDLE UNIVERSAL (MISCELLANEOUS) ×4 IMPLANT
CUFF TOURN SGL QUICK 18X4 (TOURNIQUET CUFF) IMPLANT
DRAPE FLUOR MINI C-ARM 54X84 (DRAPES) ×2 IMPLANT
DURAPREP 26ML APPLICATOR (WOUND CARE) ×2 IMPLANT
ELECT REM PT RETURN 9FT ADLT (ELECTROSURGICAL) ×2
ELECTRODE REM PT RTRN 9FT ADLT (ELECTROSURGICAL) ×1 IMPLANT
GAUZE 4X4 16PLY RFD (DISPOSABLE) ×1 IMPLANT
GAUZE SPONGE 4X4 12PLY STRL (GAUZE/BANDAGES/DRESSINGS) ×1 IMPLANT
GAUZE XEROFORM 1X8 LF (GAUZE/BANDAGES/DRESSINGS) ×2 IMPLANT
GLOVE BIO SURGEON STRL SZ7.5 (GLOVE) ×2 IMPLANT
GLOVE INDICATOR 8.0 STRL GRN (GLOVE) ×2 IMPLANT
GOWN STRL REUS W/ TWL LRG LVL3 (GOWN DISPOSABLE) ×2 IMPLANT
GOWN STRL REUS W/TWL LRG LVL3 (GOWN DISPOSABLE) ×4
GRAFT TRIN ELITE MED MUSC TRAN (Graft) ×1 IMPLANT
K-WIRE DBL END TROCAR 6X.045 (WIRE)
K-WIRE DBL END TROCAR 6X.062 (WIRE)
KIT TURNOVER KIT A (KITS) ×2 IMPLANT
KWIRE DBL END TROCAR 6X.045 (WIRE) IMPLANT
KWIRE DBL END TROCAR 6X.062 (WIRE) IMPLANT
NS IRRIG 500ML POUR BTL (IV SOLUTION) ×2 IMPLANT
PACK EXTREMITY ARMC (MISCELLANEOUS) ×2 IMPLANT
PENCIL SMOKE EVACUATOR (MISCELLANEOUS) ×2 IMPLANT
PLATE  STR SMALL SLANTED LT 3H (Plate) ×1 IMPLANT
PLATE STR SLANTED LT 4H (Plate) ×1 IMPLANT
PLATE STR SMALL SLANTED LT 3H (Plate) IMPLANT
RASP SM TEAR CROSS CUT (RASP) ×1 IMPLANT
SCREW 3.5X16 NONLOCKING (Screw) ×1 IMPLANT
SCREW LOCK PLATE R3 2.7X11 (Screw) ×1 IMPLANT
SCREW LOCK PLATE R3 2.7X15 (Screw) ×1 IMPLANT
SCREW LOCK PLATE R3 2.7X16 ×2 IMPLANT
SCREW LOCK PLATE R3 2.7X18 (Screw) ×2 IMPLANT
SCREW LOCK PLATE R3 2.7X20 ×1 IMPLANT
SCREW NON LOCK PLATE 2.7X16M (Screw) ×1 IMPLANT
STOCKINETTE IMPERVIOUS LG (DRAPES) ×2 IMPLANT
STRIP CLOSURE SKIN 1/2X4 (GAUZE/BANDAGES/DRESSINGS) ×1 IMPLANT
STRIP CLOSURE SKIN 1/4X4 (GAUZE/BANDAGES/DRESSINGS) IMPLANT
SUT MNCRL 4-0 (SUTURE) ×2
SUT MNCRL 4-0 27XMFL (SUTURE) ×1
SUT MNCRL 5-0+ PC-1 (SUTURE) ×1 IMPLANT
SUT MON AB 3-0 SH 27 (SUTURE) ×2
SUT MON AB 3-0 SH27 (SUTURE) IMPLANT
SUT MONOCRYL 5-0 (SUTURE) ×1
SUT VIC AB 2-0 SH 27 (SUTURE)
SUT VIC AB 2-0 SH 27XBRD (SUTURE) IMPLANT
SUT VIC AB 3-0 SH 27 (SUTURE)
SUT VIC AB 3-0 SH 27X BRD (SUTURE) IMPLANT
SUT VIC AB 4-0 FS2 27 (SUTURE) IMPLANT
SUTURE MNCRL 4-0 27XMF (SUTURE) IMPLANT

## 2018-12-30 NOTE — Anesthesia Procedure Notes (Addendum)
Procedure Name: LMA Insertion Date/Time: 12/30/2018 11:48 AM Performed by: Cameron Ali, CRNA Pre-anesthesia Checklist: Patient identified, Emergency Drugs available, Suction available, Timeout performed and Patient being monitored Patient Re-evaluated:Patient Re-evaluated prior to induction Oxygen Delivery Method: Circle system utilized Preoxygenation: Pre-oxygenation with 100% oxygen Induction Type: IV induction LMA: LMA inserted LMA Size: 4.0 Number of attempts: 1 Placement Confirmation: positive ETCO2 and breath sounds checked- equal and bilateral Tube secured with: Tape Dental Injury: Teeth and Oropharynx as per pre-operative assessment  Comments: LMA placed by Theodosia Paling, CRNA.

## 2018-12-30 NOTE — Anesthesia Postprocedure Evaluation (Signed)
Anesthesia Post Note  Patient: Shannon [name]  Procedure(s) Performed: ARTHRODESIS,LISFRANC;MULTIPLE LEFT (Left Toe)     Patient location during evaluation: PACU Anesthesia Type: General Level of consciousness: awake Pain management: pain level controlled Vital Signs Assessment: post-procedure vital signs reviewed and stable Respiratory status: respiratory function stable Cardiovascular status: stable Postop Assessment: no signs of nausea or vomiting Anesthetic complications: no    Veda Canning

## 2018-12-30 NOTE — H&P (Signed)
HISTORY AND PHYSICAL INTERVAL NOTE:  12/30/2018  11:26 AM  [name] Castellanos  has presented today for surgery, with the diagnosis of M19.072 PRIMARY OSTEOARTHRITIS OF LEFT FOOT.  The various methods of treatment have been discussed with the patient.  No guarantees were given.  After consideration of risks, benefits and other options for treatment, the patient has consented to surgery.  I have reviewed the patients' chart and labs.     A history and physical examination was performed in my office.  The patient was reexamined.  There have been no changes to this history and physical examination.  Samara Deist A

## 2018-12-30 NOTE — Transfer of Care (Signed)
Immediate Anesthesia Transfer of Care Note  Patient: Shannon [name]  Procedure(s) Performed: ARTHRODESIS,LISFRANC;MULTIPLE LEFT (Left Toe)  Patient Location: PACU  Anesthesia Type: General  Level of Consciousness: awake, alert  and patient cooperative  Airway and Oxygen Therapy: Patient Spontanous Breathing and Patient connected to supplemental oxygen  Post-op Assessment: Post-op Vital signs reviewed, Patient's Cardiovascular Status Stable, Respiratory Function Stable, Patent Airway and No signs of Nausea or vomiting  Post-op Vital Signs: Reviewed and stable  Complications: No apparent anesthesia complications

## 2018-12-30 NOTE — Op Note (Signed)
Operative note   Surgeon:Elenor Wildes Lawyer: None    Preop diagnosis: Osteoarthritis left second and third metatarsocuneiform joints    Postop diagnosis: Same    Procedure: 1.  Arthrodesis left second metatarsocuneiform joint 2.  Arthrodesis left third metatarsocuneiform joint    EBL: 15 mL    Anesthesia:local and general.  Local consisted of 10 cc of 0.5% bupivacaine with epinephrine.  Further infiltration with 20 cc of Exparel long-acting anesthetic was used.    Hemostasis: Epinephrine infiltrated along the incision sites    Specimen: None    Complications: None    Operative indications:Shannon [name] is an 68 y.o. that presents today for surgical intervention.  The risks/benefits/alternatives/complications have been discussed and consent has been given.    Procedure:  Patient was brought into the OR and placed on the operating table in thesupine position. After anesthesia was obtained theleft lower extremity was prepped and draped in usual sterile fashion.  Attention was directed to the dorsal aspect of the left foot and incision was made coursing between the second and third metatarsal and cuneiform region.  Sharp and blunt dissection carried down to the periosteum.  Subperiosteal dissection was then undertaken.  At this time there was noted to be a fair amount of para-articular spurring and the second and third metatarsocuneiform joints were completely covered with periarticular bone.  Use of a rongeur was performed to remove the gross amount of periarticular spurring.  This was smoothed with a power rasp.  Wound was flushed with copious amounts of irrigation.  At this time the second and third metatarsal cuneiform joints were then exposed.  There was noted to be severe degeneration and loss of articular cartilage to both joints.  Basically bone-on-bone arthritic changes at the second met cuneiform joint with minimal cartilage in the third met cuneiform joint.  At this  time the decision was made to perform arthrodesis of both sites.  Both sites were then prepped.  The joints were taken down to the subchondral bone plate.  They were then drilled with 2.0 mm drill bit.  This was taken down to good healthy bleeding bone.  This was then packed with Trinity bone graft on both sides.  The second cuneiform joint was fused with a Paragon 5 hole metatarsocuneiform fusion plate.  Good compression and alignment was noted.  The third met cuneiform joint was fused with a 4-hole Paragon cuneiform fusion plate.  Good compression and alignment was noted.  At this time the wounds were flushed with copious amounts of irrigation.  Layered closure was performed with a 3-0 Monocryl and 4-0 Monocryl for the deeper tissues and skin.  Steri-Strips bulky dressing were applied.  She was then placed in a equalizer walker boot.    Patient tolerated the procedure and anesthesia well.  Was transported from the OR to the PACU with all vital signs stable and vascular status intact. To be discharged per routine protocol.  Will follow up in approximately 1 week in the outpatient clinic.  A prescription for Percocet was sent to her pharmacy.

## 2018-12-30 NOTE — Anesthesia Preprocedure Evaluation (Signed)
Anesthesia Evaluation  Patient identified by MRN, date of birth, ID band Patient awake    Reviewed: Allergy & Precautions, NPO status   History of Anesthesia Complications Negative for: history of anesthetic complications  Airway Mallampati: III   Neck ROM: Full    Dental no notable dental hx.    Pulmonary neg pulmonary ROS,    breath sounds clear to auscultation       Cardiovascular + dysrhythmias (PSVT)  Rhythm:Regular Rate:Normal  Palpitations    Neuro/Psych Chronic back and neck pain    GI/Hepatic GERD  ,  Endo/Other    Renal/GU      Musculoskeletal  (+) Arthritis ,   Abdominal   Peds  Hematology   Anesthesia Other Findings   Reproductive/Obstetrics                             Anesthesia Physical  Anesthesia Plan  ASA: II  Anesthesia Plan: General   Post-op Pain Management:    Induction: Intravenous  PONV Risk Score and Plan: 3 and Ondansetron, Dexamethasone and Scopolamine patch - Pre-op  Airway Management Planned: LMA  Additional Equipment:   Intra-op Plan:   Post-operative Plan: Extubation in OR  Informed Consent: I have reviewed the patients History and Physical, chart, labs and discussed the procedure including the risks, benefits and alternatives for the proposed anesthesia with the patient or authorized representative who has indicated his/her understanding and acceptance.       Plan Discussed with: CRNA  Anesthesia Plan Comments:         Anesthesia Quick Evaluation

## 2019-01-01 ENCOUNTER — Encounter: Payer: Self-pay | Admitting: *Deleted

## 2019-01-04 ENCOUNTER — Encounter: Payer: Medicare Other | Admitting: Family Medicine

## 2019-01-08 ENCOUNTER — Other Ambulatory Visit: Payer: Self-pay | Admitting: Urology

## 2019-01-13 ENCOUNTER — Other Ambulatory Visit: Payer: Self-pay | Admitting: Urology

## 2019-01-18 ENCOUNTER — Encounter: Payer: Self-pay | Admitting: Family Medicine

## 2019-02-11 DIAGNOSIS — M19072 Primary osteoarthritis, left ankle and foot: Secondary | ICD-10-CM | POA: Diagnosis not present

## 2019-02-16 ENCOUNTER — Telehealth: Payer: Self-pay | Admitting: Family Medicine

## 2019-02-16 DIAGNOSIS — Z0000 Encounter for general adult medical examination without abnormal findings: Secondary | ICD-10-CM

## 2019-02-16 DIAGNOSIS — R739 Hyperglycemia, unspecified: Secondary | ICD-10-CM

## 2019-02-16 NOTE — Telephone Encounter (Signed)
-----   Message from Cloyd Stagers, RT sent at 02/05/2019  3:20 PM EST ----- Regarding: Lab Orders for Wednesday 1.27.2021 Please place lab orders for Wednesday 1.27.2021, office visit for physical on Wednesday  2.3.2021 Thank you, Dyke Maes RT(R)

## 2019-02-17 ENCOUNTER — Ambulatory Visit: Payer: Medicare Other

## 2019-02-17 ENCOUNTER — Other Ambulatory Visit: Payer: Medicare Other

## 2019-02-21 ENCOUNTER — Ambulatory Visit: Payer: Medicare Other

## 2019-02-22 ENCOUNTER — Ambulatory Visit: Payer: Medicare Other

## 2019-02-24 ENCOUNTER — Encounter: Payer: Medicare Other | Admitting: Family Medicine

## 2019-02-27 ENCOUNTER — Ambulatory Visit: Payer: Medicare PPO | Attending: Internal Medicine

## 2019-02-27 DIAGNOSIS — Z23 Encounter for immunization: Secondary | ICD-10-CM | POA: Insufficient documentation

## 2019-02-27 NOTE — Progress Notes (Signed)
   Covid-19 Vaccination Clinic  Name:  Shannon [name]    MRN: [id number] DOB: 1950-06-05  02/27/2019  [name] was observed post Covid-19 immunization for 15 minutes without incidence. She was provided with Vaccine Information Sheet and instruction to access the V-Safe system.   [name] was instructed to call 911 with any severe reactions post vaccine: Marland Kitchen Difficulty breathing  . Swelling of your face and throat  . A fast heartbeat  . A bad rash all over your body  . Dizziness and weakness    Immunizations Administered    Name Date Dose VIS Date Route   Pfizer COVID-19 Vaccine 02/27/2019  5:27 PM 0.3 mL 01/01/2019 Intramuscular   Manufacturer: [place]   Lot: [id number]   [place]: [id number]

## 2019-03-16 DIAGNOSIS — M19072 Primary osteoarthritis, left ankle and foot: Secondary | ICD-10-CM | POA: Diagnosis not present

## 2019-03-24 ENCOUNTER — Ambulatory Visit: Payer: Medicare PPO | Attending: Internal Medicine

## 2019-03-24 DIAGNOSIS — Z23 Encounter for immunization: Secondary | ICD-10-CM | POA: Insufficient documentation

## 2019-03-24 NOTE — Progress Notes (Signed)
   Covid-19 Vaccination Clinic  Name:  Shannon [name]    MRN: [id number] DOB: 1950/05/12  03/24/2019  [name] was observed post Covid-19 immunization for 15 minutes without incident. She was provided with Vaccine Information Sheet and instruction to access the V-Safe system.   [name] was instructed to call 911 with any severe reactions post vaccine: Marland Kitchen Difficulty breathing  . Swelling of face and throat  . A fast heartbeat  . A bad rash all over body  . Dizziness and weakness   Immunizations Administered    Name Date Dose VIS Date Route   Pfizer COVID-19 Vaccine 03/24/2019  2:27 PM 0.3 mL 01/01/2019 Intramuscular   Manufacturer: [place]   Lot: [id number]   [place]: [id number]

## 2019-04-12 ENCOUNTER — Other Ambulatory Visit: Payer: Self-pay | Admitting: Family Medicine

## 2019-04-12 DIAGNOSIS — Z1231 Encounter for screening mammogram for malignant neoplasm of breast: Secondary | ICD-10-CM

## 2019-04-26 ENCOUNTER — Other Ambulatory Visit: Payer: Self-pay

## 2019-04-26 ENCOUNTER — Ambulatory Visit: Payer: Medicare Other

## 2019-04-26 ENCOUNTER — Other Ambulatory Visit (INDEPENDENT_AMBULATORY_CARE_PROVIDER_SITE_OTHER): Payer: Medicare PPO

## 2019-04-26 DIAGNOSIS — Z0000 Encounter for general adult medical examination without abnormal findings: Secondary | ICD-10-CM

## 2019-04-26 DIAGNOSIS — R739 Hyperglycemia, unspecified: Secondary | ICD-10-CM

## 2019-04-26 LAB — LIPID PANEL
Cholesterol: 164 mg/dL (ref 0–200)
HDL: 76.7 mg/dL (ref >39.00)
LDL Cholesterol: 79 mg/dL (ref 0–99)
NonHDL: 87.65
Total CHOL/HDL Ratio: 2
Triglycerides: 43 mg/dL (ref 0.0–149.0)
VLDL: 8.6 mg/dL (ref 0.0–40.0)

## 2019-04-26 LAB — CBC WITH DIFFERENTIAL/PLATELET
Basophils Absolute: 0 10*3/uL (ref 0.0–0.1)
Basophils Relative: 0.9 % (ref 0.0–3.0)
Eosinophils Absolute: 0.1 10*3/uL (ref 0.0–0.7)
Eosinophils Relative: 2.4 % (ref 0.0–5.0)
HCT: 40.5 % (ref 36.0–46.0)
Hemoglobin: 13.7 g/dL (ref 12.0–15.0)
Lymphocytes Relative: 38 % (ref 12.0–46.0)
Lymphs Abs: 1.6 10*3/uL (ref 0.7–4.0)
MCHC: 33.8 g/dL (ref 30.0–36.0)
MCV: 91.5 fl (ref 78.0–100.0)
Monocytes Absolute: 0.5 10*3/uL (ref 0.1–1.0)
Monocytes Relative: 11 % (ref 3.0–12.0)
Neutro Abs: 2 10*3/uL (ref 1.4–7.7)
Neutrophils Relative %: 47.7 % (ref 43.0–77.0)
Platelets: 239 10*3/uL (ref 150.0–400.0)
RBC: 4.42 Mil/uL (ref 3.87–5.11)
RDW: 13.1 % (ref 11.5–15.5)
WBC: 4.2 10*3/uL (ref 4.0–10.5)

## 2019-04-26 LAB — COMPREHENSIVE METABOLIC PANEL
ALT: 14 U/L (ref 0–35)
AST: 19 U/L (ref 0–37)
Albumin: 4 g/dL (ref 3.5–5.2)
Alkaline Phosphatase: 90 U/L (ref 39–117)
BUN: 22 mg/dL (ref 6–23)
CO2: 29 mEq/L (ref 19–32)
Calcium: 9.1 mg/dL (ref 8.4–10.5)
Chloride: 103 mEq/L (ref 96–112)
Creatinine, Ser: 0.77 mg/dL (ref 0.40–1.20)
GFR: 73.95 mL/min (ref >60.00)
Glucose, Bld: 109 mg/dL — ABNORMAL HIGH (ref 70–99)
Potassium: 4.4 mEq/L (ref 3.5–5.1)
Sodium: 139 mEq/L (ref 135–145)
Total Bilirubin: 0.8 mg/dL (ref 0.2–1.2)
Total Protein: 6.7 g/dL (ref 6.0–8.3)

## 2019-04-26 LAB — TSH: TSH: 0.91 u[IU]/mL (ref 0.35–4.50)

## 2019-04-26 LAB — HEMOGLOBIN A1C: Hgb A1c MFr Bld: 6.1 % (ref 4.6–6.5)

## 2019-04-28 ENCOUNTER — Ambulatory Visit (INDEPENDENT_AMBULATORY_CARE_PROVIDER_SITE_OTHER): Payer: Medicare PPO

## 2019-04-28 VITALS — Wt 160.0 lb

## 2019-04-28 DIAGNOSIS — Z0000 Encounter for general adult medical examination without abnormal findings: Secondary | ICD-10-CM | POA: Diagnosis not present

## 2019-04-28 NOTE — Progress Notes (Addendum)
PCP notes:  Health Maintenance: No gaps noted   Abnormal Screenings: none   Patient concerns: Intermittent left hip pain    Nurse concerns: none   Next PCP appt: 05/05/2019 @ 9:30 am    I reviewed health advisor's note, was available for consultation, and agree with documentation and plan. Loura Pardon MD

## 2019-04-28 NOTE — Patient Instructions (Signed)
Shannon [name] , Thank you for taking time to come for your Medicare Wellness Visit. I appreciate your ongoing commitment to your health goals. Please review the following plan we discussed and let me know if I can assist you in the future.   Screening recommendations/referrals: Colonoscopy: Up to date, completed 07/01/2016 Mammogram: scheduled 05/06/2019 Bone Density: completed 12/18/2015 Recommended yearly ophthalmology/optometry visit for glaucoma screening and checkup Recommended yearly dental visit for hygiene and checkup  Vaccinations: Influenza vaccine: Up to date, completed 09/10/2018 Pneumococcal vaccine: Completed series Tdap vaccine: Up to date, completed 02/09/2010 Shingles vaccine: administered 05/03/2016    Advanced directives: copy in chart  Conditions/risks identified: none  Next appointment: 05/05/2019 @ 9:30 am    Preventive Care 35 Years and Older, Female Preventive care refers to lifestyle choices and visits with your health care provider that can promote health and wellness. What does preventive care include?  A yearly physical exam. This is also called an annual well check.  Dental exams once or twice a year.  Routine eye exams. Ask your health care provider how often you should have your eyes checked.  Personal lifestyle choices, including:  Daily care of your teeth and gums.  Regular physical activity.  Eating a healthy diet.  Avoiding tobacco and drug use.  Limiting alcohol use.  Practicing safe sex.  Taking low-dose aspirin every day.  Taking vitamin and mineral supplements as recommended by your health care provider. What happens during an annual well check? The services and screenings done by your health care provider during your annual well check will depend on your age, overall health, lifestyle risk factors, and family history of disease. Counseling  Your health care provider may ask you questions about your:  Alcohol use.  Tobacco  use.  Drug use.  Emotional well-being.  Home and relationship well-being.  Sexual activity.  Eating habits.  History of falls.  Memory and ability to understand (cognition).  Work and work Statistician.  Reproductive health. Screening  You may have the following tests or measurements:  Height, weight, and BMI.  Blood pressure.  Lipid and cholesterol levels. These may be checked every 5 years, or more frequently if you are over 79 years old.  Skin check.  Lung cancer screening. You may have this screening every year starting at age 12 if you have a 30-pack-year history of smoking and currently smoke or have quit within the past 15 years.  Fecal occult blood test (FOBT) of the stool. You may have this test every year starting at age 72.  Flexible sigmoidoscopy or colonoscopy. You may have a sigmoidoscopy every 5 years or a colonoscopy every 10 years starting at age 7.  Hepatitis C blood test.  Hepatitis B blood test.  Sexually transmitted disease (STD) testing.  Diabetes screening. This is done by checking your blood sugar (glucose) after you have not eaten for a while (fasting). You may have this done every 1-3 years.  Bone density scan. This is done to screen for osteoporosis. You may have this done starting at age 50.  Mammogram. This may be done every 1-2 years. Talk to your health care provider about how often you should have regular mammograms. Talk with your health care provider about your test results, treatment options, and if necessary, the need for more tests. Vaccines  Your health care provider may recommend certain vaccines, such as:  Influenza vaccine. This is recommended every year.  Tetanus, diphtheria, and acellular pertussis (Tdap, Td) vaccine. You may need a  Td booster every 10 years.  Zoster vaccine. You may need this after age 41.  Pneumococcal 13-valent conjugate (PCV13) vaccine. One dose is recommended after age 78.  Pneumococcal  polysaccharide (PPSV23) vaccine. One dose is recommended after age 81. Talk to your health care provider about which screenings and vaccines you need and how often you need them. This information is not intended to replace advice given to you by your health care provider. Make sure you discuss any questions you have with your health care provider. Document Released: 02/03/2015 Document Revised: 09/27/2015 Document Reviewed: 11/08/2014 Elsevier Interactive Patient Education  2017 [place] Prevention in the Home Falls can cause injuries. They can happen to people of all ages. There are many things you can do to make your home safe and to help prevent falls. What can I do on the outside of my home?  Regularly fix the edges of walkways and driveways and fix any cracks.  Remove anything that might make you trip as you walk through a door, such as a raised step or threshold.  Trim any bushes or trees on the path to your home.  Use bright outdoor lighting.  Clear any walking paths of anything that might make someone trip, such as rocks or tools.  Regularly check to see if handrails are loose or broken. Make sure that both sides of any steps have handrails.  Any raised decks and porches should have guardrails on the edges.  Have any leaves, snow, or ice cleared regularly.  Use sand or salt on walking paths during winter.  Clean up any spills in your garage right away. This includes oil or grease spills. What can I do in the bathroom?  Use night lights.  Install grab bars by the toilet and in the tub and shower. Do not use towel bars as grab bars.  Use non-skid mats or decals in the tub or shower.  If you need to sit down in the shower, use a plastic, non-slip stool.  Keep the floor dry. Clean up any water that spills on the floor as soon as it happens.  Remove soap buildup in the tub or shower regularly.  Attach bath mats securely with double-sided non-slip rug  tape.  Do not have throw rugs and other things on the floor that can make you trip. What can I do in the bedroom?  Use night lights.  Make sure that you have a light by your bed that is easy to reach.  Do not use any sheets or blankets that are too big for your bed. They should not hang down onto the floor.  Have a firm chair that has side arms. You can use this for support while you get dressed.  Do not have throw rugs and other things on the floor that can make you trip. What can I do in the kitchen?  Clean up any spills right away.  Avoid walking on wet floors.  Keep items that you use a lot in easy-to-reach places.  If you need to reach something above you, use a strong step stool that has a grab bar.  Keep electrical cords out of the way.  Do not use floor polish or wax that makes floors slippery. If you must use wax, use non-skid floor wax.  Do not have throw rugs and other things on the floor that can make you trip. What can I do with my stairs?  Do not leave any items on the stairs.  Make sure that there are handrails on both sides of the stairs and use them. Fix handrails that are broken or loose. Make sure that handrails are as long as the stairways.  Check any carpeting to make sure that it is firmly attached to the stairs. Fix any carpet that is loose or worn.  Avoid having throw rugs at the top or bottom of the stairs. If you do have throw rugs, attach them to the floor with carpet tape.  Make sure that you have a light switch at the top of the stairs and the bottom of the stairs. If you do not have them, ask someone to add them for you. What else can I do to help prevent falls?  Wear shoes that:  Do not have high heels.  Have rubber bottoms.  Are comfortable and fit you well.  Are closed at the toe. Do not wear sandals.  If you use a stepladder:  Make sure that it is fully opened. Do not climb a closed stepladder.  Make sure that both sides of the  stepladder are locked into place.  Ask someone to hold it for you, if possible.  Clearly mark and make sure that you can see:  Any grab bars or handrails.  First and last steps.  Where the edge of each step is.  Use tools that help you move around (mobility aids) if they are needed. These include:  Canes.  Walkers.  Scooters.  Crutches.  Turn on the lights when you go into a dark area. Replace any light bulbs as soon as they burn out.  Set up your furniture so you have a clear path. Avoid moving your furniture around.  If any of your floors are uneven, fix them.  If there are any pets around you, be aware of where they are.  Review your medicines with your doctor. Some medicines can make you feel dizzy. This can increase your chance of falling. Ask your doctor what other things that you can do to help prevent falls. This information is not intended to replace advice given to you by your health care provider. Make sure you discuss any questions you have with your health care provider. Document Released: 11/03/2008 Document Revised: 06/15/2015 Document Reviewed: 02/11/2014 Elsevier Interactive Patient Education  2017 [employer].

## 2019-04-28 NOTE — Progress Notes (Signed)
Subjective:   Shannon [name] is a 69 y.o. female who presents for Medicare Annual (Subsequent) preventive examination.  Review of Systems: N/A   This visit is being conducted through telemedicine via telephone at the nurse health advisor's home address due to the COVID-19 pandemic. This patient has given me verbal consent via doximity to conduct this visit, patient states they are participating from their home address. Patient and myself are on the telephone call. There is no referral for this visit. Some vital signs may be absent or patient reported.    Patient identification: identified by name, DOB, and current address   Cardiac Risk Factors include: advanced age (>36men, >75 women)     Objective:     Vitals: Wt 160 lb (72.6 kg)   BMI 24.33 kg/m   Body mass index is 24.33 kg/m.  Advanced Directives 04/28/2019 12/30/2018 08/12/2018 02/03/2018 12/24/2017 12/16/2016 12/08/2016  Does Patient Have a Medical Advance Directive? Yes Yes Yes Yes Yes Yes No  Type of Paramedic of [facility];Living will [place];Living will [place];Living will Living will [place];Living will [place];Living will -  Does patient want to make changes to medical advance directive? - - No - Patient declined Yes (Inpatient - patient requests chaplain consult to change a medical advance directive) - - -  Copy of [place] in Chart? Yes - validated most recent copy scanned in chart (See row information) No - copy requested Yes - validated most recent copy scanned in chart (See row information) No - copy requested No - copy requested No - copy requested -  Would patient like information on creating a medical advance directive? - - - - - - No - Patient declined    Tobacco Social History   Tobacco Use  Smoking Status Never Smoker  Smokeless Tobacco Never Used     Counseling given: Not  Answered   Clinical Intake:  Pre-visit preparation completed: Yes  Pain : No/denies pain     Nutritional Risks: None Diabetes: No  How often do you need to have someone help you when you read instructions, pamphlets, or other written materials from your doctor or pharmacy?: 1 - Never What is the last grade level you completed in school?: 2 masters degrees  Interpreter Needed?: No  Information entered by :: CJohnson, LPN  Past Medical History:  Diagnosis Date  . Arthritis    left foot  . Blood transfusion without reported diagnosis   . Chronic neck and back pain    received physical therapy  . Dysrhythmia    Pt told she had "benign arrythmia" after wearing monitor  . GERD (gastroesophageal reflux disease)    with esophagitis  . Motion sickness    boats, planes  . Numbness    Right outer thigh, constant, notices more with standing for a long period of time  . Plantar fasciitis    left   Past Surgical History:  Procedure Laterality Date  . ARTHRODESIS METATARSAL Left 12/30/2018   Procedure: ARTHRODESIS,LISFRANC;MULTIPLE LEFT;  Surgeon: Samara Deist, DPM;  Location: [facility];  Service: Podiatry;  Laterality: Left;  general with local  . BLADDER SUSPENSION N/A 07/04/2015   Procedure: TRANSVAGINAL TAPE (TVT) SLING                   ;  Surgeon: Emily Filbert, MD;  Location: [facility];  Service: Gynecology;  Laterality: N/A;  .  COLONOSCOPY    . CYSTOCELE REPAIR N/A 07/04/2015   Procedure: ANTERIOR REPAIR (CYSTOCELE);  Surgeon: Emily Filbert, MD;  Location: [facility];  Service: Gynecology;  Laterality: N/A;  . CYSTOSCOPY N/A 07/04/2015   Procedure: CYSTOSCOPY;  Surgeon: Emily Filbert, MD;  Location: [facility];  Service: Gynecology;  Laterality: N/A;  . DILATION AND CURETTAGE OF UTERUS    . EXCISION MORTON'S NEUROMA Left 08/12/2018   Procedure: EXCISION MORTON'S NEUROMA;  Surgeon: Samara Deist, DPM;  Location: [facility];  Service: Podiatry;  Laterality: Left;  lma local   . POLYPECTOMY    . TUBAL LIGATION  1987  . UPPER GI ENDOSCOPY    . VAGINAL HYSTERECTOMY N/A 07/04/2015   Procedure: TOTAL VAGINAL HYSTERECTOMY  ;  Surgeon: Emily Filbert, MD;  Location: [facility];  Service: Gynecology;  Laterality: N/A;   Family History  Problem Relation Age of Onset  . Stroke Mother        x 2  . Heart disease Mother        congenital arrhythmia and CHF  . Depression Mother   . Prostate cancer Brother   . Stomach cancer Paternal Uncle   . Colon cancer Neg Hx   . Esophageal cancer Neg Hx   . Kidney cancer Neg Hx   . Bladder Cancer Neg Hx    Social History   Socioeconomic History  . Marital status: Married    Spouse name: Not on file  . Number of children: Not on file  . Years of education: Not on file  . Highest education level: Not on file  Occupational History  . Not on file  Tobacco Use  . Smoking status: Never Smoker  . Smokeless tobacco: Never Used  Substance and Sexual Activity  . Alcohol use: No    Alcohol/week: 0.0 standard drinks    Comment: rare  . Drug use: No  . Sexual activity: Yes    Birth control/protection: Post-menopausal, Surgical  Other Topics Concern  . Not on file  Social History Narrative  . Not on file   Social Determinants of Health   Financial Resource Strain: Low Risk   . Difficulty of Paying Living Expenses: Not hard at all  Food Insecurity: No Food Insecurity  . Worried About Charity fundraiser in the Last Year: Never true  . Ran Out of Food in the Last Year: Never true  Transportation Needs: No Transportation Needs  . Lack of Transportation (Medical): No  . Lack of Transportation (Non-Medical): No  Physical Activity: Sufficiently Active  . Days of Exercise per Week: 7 days  . Minutes of Exercise per Session: 60 min  Stress: No Stress Concern Present  . Feeling of Stress : Not at all  Social Connections:   . Frequency of Communication with Friends and Family:   . Frequency of Social Gatherings with Friends and  Family:   . Attends Religious Services:   . Active Member of Clubs or Organizations:   . Attends Archivist Meetings:   Marland Kitchen Marital Status:     Outpatient Encounter Medications as of 04/28/2019  Medication Sig  . conjugated estrogens (PREMARIN) vaginal cream Place vaginally 3 (three) times a week.  . diclofenac sodium (VOLTAREN) 1 % GEL Apply topically as needed.  . famotidine (PEPCID) 20 MG tablet Take 20 mg by mouth daily as needed for heartburn or indigestion.  . Lactobacillus (Ingenio PRO-B PO) Take by mouth daily.  Marland Kitchen MAGNESIUM GLUCONATE PO Take by mouth  daily.  . Melatonin 3 MG TABS Take by mouth at bedtime as needed.  . Multiple Vitamin (MULTIVITAMIN) tablet Take 1 tablet by mouth daily.    Marland Kitchen MYRBETRIQ 50 MG TB24 tablet TAKE ONE TABLET BY MOUTH DAILY  . [place] Wort 300 MG TABS Take by mouth.  . oxyCODONE-acetaminophen (PERCOCET) 5-325 MG tablet Take 1 tablet by mouth every 4 (four) hours as needed for severe pain. (Patient not taking: Reported on 04/28/2019)   No facility-administered encounter medications on file as of 04/28/2019.    Activities of Daily Living In your present state of health, do you have any difficulty performing the following activities: 04/28/2019 12/30/2018  Hearing? N N  Vision? N N  Difficulty concentrating or making decisions? N N  Walking or climbing stairs? N N  Dressing or bathing? N N  Doing errands, shopping? N -  Preparing Food and eating ? N -  Using the Toilet? N -  In the past six months, have you accidently leaked urine? Y -  Comment wears liner -  Do you have problems with loss of bowel control? N -  Managing your Medications? N -  Managing your Finances? N -  Housekeeping or managing your Housekeeping? N -  Some recent data might be hidden    Patient Care Team: [place], Wynelle Fanny, MD as PCP - General Emily Filbert, MD as Consulting Physician (Obstetrics and Gynecology)    Assessment:   This is a routine wellness examination for  [place].  Exercise Activities and Dietary recommendations Current Exercise Habits: Home exercise routine, Type of exercise: walking, Time (Minutes): 60, Frequency (Times/Week): 7, Weekly Exercise (Minutes/Week): 420, Intensity: Moderate, Exercise limited by: None identified  Goals    . Increase physical activity     Starting 12/24/2017, I will continue to exercise with personal trainer for 30 minutes twice weekly and to walk for 1/2-1 mile twice weekly.     . Patient Stated     04/28/2019, I will continue to walk 2 miles everyday.        Fall Risk Fall Risk  04/28/2019 12/24/2017 12/25/2016 12/16/2016 11/24/2015  Falls in the past year? 0 0 No No Yes  Comment - - - - pt fell while hiking; bruising and lower back pain  Number falls in past yr: 0 - - - 1  Injury with Fall? 0 - - - Yes  Risk for fall due to : No Fall Risks - - - -  Follow up Falls prevention discussed;Falls evaluation completed - - - Falls evaluation completed   Is the patient's home free of loose throw rugs in walkways, pet beds, electrical cords, etc?   yes      Grab bars in the bathroom? yes      Handrails on the stairs?   yes      Adequate lighting?   yes  Timed Get Up and Go performed: N/A  Depression Screen PHQ 2/9 Scores 04/28/2019 12/24/2017 12/25/2016 11/24/2015  PHQ - 2 Score 0 0 0 0  PHQ- 9 Score 0 0 - -     Cognitive Function MMSE - Mini Mental State Exam 04/28/2019 12/24/2017 12/16/2016 11/24/2015  Orientation to time 5 5 5 5   Orientation to Place 5 5 5 5   Registration 3 3 3 3   Attention/ Calculation 5 0 0 0  Recall 3 3 3 3   Language- name 2 objects - 0 0 0  Language- repeat 1 1 1 1   Language- follow 3 step  command - 3 3 3   Language- read & follow direction - 0 0 0  Write a sentence - 0 0 0  Copy design - 0 0 0  Total score - 20 20 20   Mini Cog  Mini-Cog screen was completed. Maximum score is 22. A value of 0 denotes this part of the MMSE was not completed or the patient failed this part of the Mini-Cog  screening.       Immunization History  Administered Date(s) Administered  . Influenza Whole 01/21/2005, 11/10/2006  . Influenza, High Dose Seasonal PF 10/25/2016, 09/30/2017, 09/10/2018  . Influenza-Unspecified 10/31/2012, 11/21/2014, 10/05/2015  . PFIZER SARS-COV-2 Vaccination 02/27/2019, 03/24/2019  . Pneumococcal Conjugate-13 11/30/2014  . Pneumococcal Polysaccharide-23 11/17/2013, 09/10/2018  . Td 10/15/2001, 02/09/2010  . Zoster 09/04/2010  . Zoster Recombinat (Shingrix) 05/03/2016    Qualifies for Shingles Vaccine: administered 05/03/2016  Screening Tests Health Maintenance  Topic Date Due  . MAMMOGRAM  01/08/2019  . COLONOSCOPY  07/02/2019  . INFLUENZA VACCINE  08/22/2019  . TETANUS/TDAP  02/10/2020  . DEXA SCAN  Completed  . Hepatitis C Screening  Completed  . PNA vac Low Risk Adult  Completed    Cancer Screenings: Lung: Low Dose CT Chest recommended if Age 29-80 years, 30 pack-year currently smoking OR have quit w/in 15 years. Patient does not qualify. Breast:  Up to date on Mammogram: scheduled 05/06/2019   Bone Density/Dexa: completed 12/18/2015 Colorectal: completed 07/01/2016  Additional Screenings:  Hepatitis C Screening: 11/24/2015     Plan:   Patient will continue to walk 2 miles everyday.   I have personally reviewed and noted the following in the patient's chart:   . Medical and social history . Use of alcohol, tobacco or illicit drugs  . Current medications and supplements . Functional ability and status . Nutritional status . Physical activity . Advanced directives . List of other physicians . Hospitalizations, surgeries, and ER visits in previous 12 months . Vitals . Screenings to include cognitive, depression, and falls . Referrals and appointments  In addition, I have reviewed and discussed with patient certain preventive protocols, quality metrics, and best practice recommendations. A written personalized care plan for preventive  services as well as general preventive health recommendations were provided to patient.     Andrez Grime, LPN  [id number]

## 2019-05-05 ENCOUNTER — Encounter: Payer: Self-pay | Admitting: Family Medicine

## 2019-05-05 ENCOUNTER — Ambulatory Visit (INDEPENDENT_AMBULATORY_CARE_PROVIDER_SITE_OTHER): Payer: Medicare PPO | Admitting: Family Medicine

## 2019-05-05 ENCOUNTER — Other Ambulatory Visit: Payer: Self-pay

## 2019-05-05 VITALS — BP 104/62 | HR 77 | Temp 96.9°F | Ht 68.75 in | Wt 164.4 lb

## 2019-05-05 DIAGNOSIS — R739 Hyperglycemia, unspecified: Secondary | ICD-10-CM

## 2019-05-05 DIAGNOSIS — H6123 Impacted cerumen, bilateral: Secondary | ICD-10-CM | POA: Diagnosis not present

## 2019-05-05 DIAGNOSIS — Z0000 Encounter for general adult medical examination without abnormal findings: Secondary | ICD-10-CM | POA: Diagnosis not present

## 2019-05-05 DIAGNOSIS — H6120 Impacted cerumen, unspecified ear: Secondary | ICD-10-CM | POA: Insufficient documentation

## 2019-05-05 DIAGNOSIS — M25552 Pain in left hip: Secondary | ICD-10-CM

## 2019-05-05 DIAGNOSIS — I471 Supraventricular tachycardia: Secondary | ICD-10-CM

## 2019-05-05 NOTE — Assessment & Plan Note (Signed)
This is becoming more bothersome Plans to schedule an xray in the future

## 2019-05-05 NOTE — Assessment & Plan Note (Signed)
Lab Results  Component Value Date   HGBA1C 6.1 04/26/2019   Stable Good diet/exercise  disc imp of low glycemic diet and wt loss to prevent DM2  Will continue to follow

## 2019-05-05 NOTE — Patient Instructions (Addendum)
I think you are due for a colonoscopy June or later If you don't get a reminder - call the GI office to check in   Add 1000 iu of vitamin D daily to your multivitamin   Schedule an xray of your hip at check out   Try debrox or hydrogen peroxide for ear wax  We can flush ears later if needed   Keep taking good care of yourself

## 2019-05-05 NOTE — Assessment & Plan Note (Signed)
Reviewed health habits including diet and exercise and skin cancer prevention Reviewed appropriate screening tests for age  Also reviewed health mt list, fam hx and immunization status , as well as social and family history   See HPI Labs reviewed  Has had covid vaccines  Colonoscopy due 6/21 -she is aware  Nl dexa 11/17 and no falls or fx

## 2019-05-05 NOTE — Assessment & Plan Note (Signed)
No problems  Avoids caffeine

## 2019-05-05 NOTE — Assessment & Plan Note (Signed)
Worse on R Enc to try debrox or other cerumen product at home Fu if needs irrigation

## 2019-05-05 NOTE — Progress Notes (Signed)
Subjective:    Patient ID: Shannon [name], female    DOB: 01-Jun-1950, 69 y.o.   MRN: [id number]  This visit occurred during the SARS-CoV-2 public health emergency.  Safety protocols were in place, including screening questions prior to the visit, additional usage of staff PPE, and extensive cleaning of exam room while observing appropriate contact time as indicated for disinfecting solutions.    HPI  Here for health maintenance exam and to review chronic medical problems    Wt Readings from Last 3 Encounters:  05/05/19 164 lb 6 oz (74.6 kg)  04/28/19 160 lb (72.6 kg)  12/30/18 162 lb (73.5 kg)   24.45 kg/m  Has been well in general    Had her amw on 4/7  No gaps noted   Mammogram was 12/19  She has it scheduled for 4/15  No lumps on self exam   Colonoscopy 6/18 with 3 year recall    Flu shot 8/20 Td 1/12  She had the shingrix vaccines Also pfizer covid vaccines (she is worried about the variants)  Wants to go to a music camp this summer   Had 2 foot surgeries  Neuroma / fusing 2 arthritic joints in mid foot  Walks 2 miles per day  dexa 11/17 -BMD in the normal range [place]-none  Fractures-none  Supplements  mvi  Exercise walking   Past hx of PSVT No problems  No caffeine   Pulse Readings from Last 3 Encounters:  05/05/19 77  12/30/18 (!) 57  11/18/18 69   BP Readings from Last 3 Encounters:  05/05/19 104/62  12/30/18 134/75  11/18/18 (!) 94/56    H/o mildly elevated glucose in the past  Lab Results  Component Value Date   HGBA1C 6.1 04/26/2019   No change in A1C Eats healthy  Exercises  Glucose 109 (day after easter)  Has a sweet tooth- does not eat a lot of sweets   Cholesterol  Lab Results  Component Value Date   CHOL 164 04/26/2019   CHOL 158 12/24/2017   CHOL 147 12/16/2016   Lab Results  Component Value Date   HDL 76.70 04/26/2019   HDL 72.90 12/24/2017   HDL 68.60 12/16/2016   Lab Results  Component Value Date    LDLCALC 79 04/26/2019   LDLCALC 75 12/24/2017   LDLCALC 68 12/16/2016   Lab Results  Component Value Date   TRIG 43.0 04/26/2019   TRIG 50.0 12/24/2017   TRIG 53.0 12/16/2016   Lab Results  Component Value Date   CHOLHDL 2 04/26/2019   CHOLHDL 2 12/24/2017   CHOLHDL 2 12/16/2016   No results found for: LDLDIRECT Excellent profile   Other labs Lab Results  Component Value Date   CREATININE 0.77 04/26/2019   BUN 22 04/26/2019   NA 139 04/26/2019   K 4.4 04/26/2019   CL 103 04/26/2019   CO2 29 04/26/2019   Lab Results  Component Value Date   ALT 14 04/26/2019   AST 19 04/26/2019   ALKPHOS 90 04/26/2019   BILITOT 0.8 04/26/2019    Lab Results  Component Value Date   WBC 4.2 04/26/2019   HGB 13.7 04/26/2019   HCT 40.5 04/26/2019   MCV 91.5 04/26/2019   PLT 239.0 04/26/2019   Lab Results  Component Value Date   TSH 0.91 04/26/2019    More pain in L hip/groin - wonders about arthritis  Hurts at night No xray in a while   Interested in xray perhaps later  Patient Active Problem List   Diagnosis Date Noted  . Cerumen impaction 05/05/2019  . PSVT (paroxysmal supraventricular tachycardia) ([place]) 06/17/2017  . Dyspnea on exertion 05/12/2017  . Palpitations 05/05/2017  . Actinic keratosis 12/25/2016  . Colitis presumed infectious 12/08/2016  . Recurrent UTI 08/23/2016  . Chronic paronychia of finger 07/26/2016  . Estrogen deficiency 12/06/2015  . Elevated blood sugar 12/06/2015  . Third degree uterine prolapse 04/12/2015  . Incontinence of urine in female 04/12/2015  . Pelvic relaxation due to uterine prolapse 03/27/2015  . Medicare annual wellness visit, initial 11/30/2014  . Routine general medical examination at a health care facility 11/05/2012  . Left hip pain 12/04/2011  . Screening mammogram, encounter for 11/13/2011  . PLANTAR FASCIITIS, LEFT 07/28/2009  . PERSONAL HX COLONIC POLYPS 11/02/2007  . GERD 10/20/2006   Past Medical History:   Diagnosis Date  . Arthritis    left foot  . Blood transfusion without reported diagnosis   . Chronic neck and back pain    received physical therapy  . Dysrhythmia    Pt told she had "benign arrythmia" after wearing monitor  . GERD (gastroesophageal reflux disease)    with esophagitis  . Motion sickness    boats, planes  . Numbness    Right outer thigh, constant, notices more with standing for a long period of time  . Plantar fasciitis    left   Past Surgical History:  Procedure Laterality Date  . ARTHRODESIS METATARSAL Left 12/30/2018   Procedure: ARTHRODESIS,LISFRANC;MULTIPLE LEFT;  Surgeon: Samara Deist, DPM;  Location: [facility];  Service: Podiatry;  Laterality: Left;  general with local  . BLADDER SUSPENSION N/A 07/04/2015   Procedure: TRANSVAGINAL TAPE (TVT) SLING                   ;  Surgeon: Emily Filbert, MD;  Location: [facility];  Service: Gynecology;  Laterality: N/A;  . COLONOSCOPY    . CYSTOCELE REPAIR N/A 07/04/2015   Procedure: ANTERIOR REPAIR (CYSTOCELE);  Surgeon: Emily Filbert, MD;  Location: [facility];  Service: Gynecology;  Laterality: N/A;  . CYSTOSCOPY N/A 07/04/2015   Procedure: CYSTOSCOPY;  Surgeon: Emily Filbert, MD;  Location: [facility];  Service: Gynecology;  Laterality: N/A;  . DILATION AND CURETTAGE OF UTERUS    . EXCISION MORTON'S NEUROMA Left 08/12/2018   Procedure: EXCISION MORTON'S NEUROMA;  Surgeon: Samara Deist, DPM;  Location: [facility];  Service: Podiatry;  Laterality: Left;  lma local  . POLYPECTOMY    . TUBAL LIGATION  1987  . UPPER GI ENDOSCOPY    . VAGINAL HYSTERECTOMY N/A 07/04/2015   Procedure: TOTAL VAGINAL HYSTERECTOMY  ;  Surgeon: Emily Filbert, MD;  Location: [facility];  Service: Gynecology;  Laterality: N/A;   Social History   Tobacco Use  . Smoking status: Never Smoker  . Smokeless tobacco: Never Used  Substance Use Topics  . Alcohol use: No    Alcohol/week: 0.0 standard drinks    Comment: rare  . Drug use: No   Family  History  Problem Relation Age of Onset  . Stroke Mother        x 2  . Heart disease Mother        congenital arrhythmia and CHF  . Depression Mother   . Prostate cancer Brother   . Stomach cancer Paternal Uncle   . Colon cancer Neg Hx   . Esophageal cancer Neg Hx   . Kidney cancer Neg Hx   .  Bladder Cancer Neg Hx    Allergies  Allergen Reactions  . Adhesive [Tape] Rash    (not sure if rash was from ointment or tape)  . Alpha-Gal Rash    (red meat S/P tick bite)  . Neomycin-Bacitracin Zn-Polymyx Rash    Ointment   Current Outpatient Medications on File Prior to Visit  Medication Sig Dispense Refill  . conjugated estrogens (PREMARIN) vaginal cream Place vaginally 3 (three) times a week. 42.5 g 6  . famotidine (PEPCID) 20 MG tablet Take 20 mg by mouth daily as needed for heartburn or indigestion.    . Lactobacillus ([place] PRO-B PO) Take by mouth daily.    Marland Kitchen MAGNESIUM GLUCONATE PO Take by mouth daily.    . Melatonin 3 MG TABS Take by mouth at bedtime as needed.    . Multiple Vitamin (MULTIVITAMIN) tablet Take 1 tablet by mouth daily.      Marland Kitchen MYRBETRIQ 50 MG TB24 tablet TAKE ONE TABLET BY MOUTH DAILY 90 tablet 10  . St Johns Wort 300 MG TABS Take by mouth.     No current facility-administered medications on file prior to visit.    Review of Systems  Constitutional: Negative for activity change, appetite change, fatigue, fever and unexpected weight change.  HENT: Negative for congestion, ear pain, rhinorrhea, sinus pressure and sore throat.        Cerumen problem   Eyes: Negative for pain, redness and visual disturbance.  Respiratory: Negative for cough, shortness of breath and wheezing.   Cardiovascular: Negative for chest pain and palpitations.  Gastrointestinal: Negative for abdominal pain, blood in stool, constipation and diarrhea.  Endocrine: Negative for polydipsia and polyuria.  Genitourinary: Negative for dysuria, frequency and urgency.  Musculoskeletal: Positive for  arthralgias. Negative for back pain and myalgias.       Hip pain  Skin: Negative for pallor and rash.  Allergic/Immunologic: Negative for environmental allergies.  Neurological: Negative for dizziness, syncope and headaches.  Hematological: Negative for adenopathy. Does not bruise/bleed easily.  Psychiatric/Behavioral: Negative for decreased concentration and dysphoric mood. The patient is not nervous/anxious.        Objective:   Physical Exam Constitutional:      General: She is not in acute distress.    Appearance: Normal appearance. She is well-developed and normal weight. She is not ill-appearing or diaphoretic.  HENT:     Head: Normocephalic and atraumatic.     Right Ear: Tympanic membrane, ear canal and external ear normal.     Left Ear: Tympanic membrane, ear canal and external ear normal.     Ears:     Comments: Cerumen impaction R ear    Nose: Nose normal. No congestion.     Mouth/Throat:     Mouth: Mucous membranes are moist.     Pharynx: Oropharynx is clear. No posterior oropharyngeal erythema.  Eyes:     General: No scleral icterus.    Extraocular Movements: Extraocular movements intact.     Conjunctiva/sclera: Conjunctivae normal.     Pupils: Pupils are equal, round, and reactive to light.  Neck:     Thyroid: No thyromegaly.     Vascular: No carotid bruit or JVD.  Cardiovascular:     Rate and Rhythm: Normal rate and regular rhythm.     Pulses: Normal pulses.     Heart sounds: Normal heart sounds. No gallop.   Pulmonary:     Effort: Pulmonary effort is normal. No respiratory distress.     Breath sounds: Normal breath sounds.  No wheezing.     Comments: Good air exch Chest:     Chest wall: No tenderness.  Abdominal:     General: Bowel sounds are normal. There is no distension or abdominal bruit.     Palpations: Abdomen is soft. There is no mass.     Tenderness: There is no abdominal tenderness.     Hernia: No hernia is present.  Genitourinary:    Comments:  Breast exam: No mass, nodules, thickening, tenderness, bulging, retraction, inflamation, nipple discharge or skin changes noted.  No axillary or clavicular LA.     Musculoskeletal:        General: No tenderness. Normal range of motion.     Cervical back: Normal range of motion and neck supple. No rigidity. No muscular tenderness.     Right lower leg: No edema.     Left lower leg: No edema.     Comments: No kyphosis  Some limited rom of hips  Lymphadenopathy:     Cervical: No cervical adenopathy.  Skin:    General: Skin is warm and dry.     Coloration: Skin is not pale.     Findings: No erythema or rash.     Comments: Some lentigines  Neurological:     Mental Status: She is alert. Mental status is at baseline.     Cranial Nerves: No cranial nerve deficit.     Motor: No abnormal muscle tone.     Coordination: Coordination normal.     Gait: Gait normal.     Deep Tendon Reflexes: Reflexes normal.  Psychiatric:        Mood and Affect: Mood normal.        Cognition and Memory: Cognition and memory normal.           Assessment & Plan:   Problem List Items Addressed This Visit      Cardiovascular and Mediastinum   PSVT (paroxysmal supraventricular tachycardia) ([facility])    No problems  Avoids caffeine         Nervous and Auditory   Cerumen impaction    Worse on R Enc to try debrox or other cerumen product at home Fu if needs irrigation         Other   Left hip pain    This is becoming more bothersome Plans to schedule an xray in the future      Routine general medical examination at a health care facility - Primary    Reviewed health habits including diet and exercise and skin cancer prevention Reviewed appropriate screening tests for age  Also reviewed health mt list, fam hx and immunization status , as well as social and family history   See HPI Labs reviewed  Has had covid vaccines  Colonoscopy due 6/21 -she is aware  Nl dexa 11/17 and no falls or fx         Elevated blood sugar    Lab Results  Component Value Date   HGBA1C 6.1 04/26/2019   Stable Good diet/exercise  disc imp of low glycemic diet and wt loss to prevent DM2  Will continue to follow

## 2019-05-06 ENCOUNTER — Ambulatory Visit
Admission: RE | Admit: 2019-05-06 | Discharge: 2019-05-06 | Disposition: A | Discharge status: Home / Self Care | Payer: Medicare Other | Source: Ambulatory Visit

## 2019-05-06 DIAGNOSIS — Z1231 Encounter for screening mammogram for malignant neoplasm of breast: Secondary | ICD-10-CM | POA: Diagnosis not present

## 2019-05-10 ENCOUNTER — Ambulatory Visit (INDEPENDENT_AMBULATORY_CARE_PROVIDER_SITE_OTHER)
Admission: RE | Admit: 2019-05-10 | Discharge: 2019-05-10 | Disposition: A | Discharge status: Home / Self Care | Payer: Medicare PPO | Source: Ambulatory Visit | Attending: Family Medicine | Admitting: Family Medicine

## 2019-05-10 ENCOUNTER — Other Ambulatory Visit: Payer: Self-pay | Admitting: Family Medicine

## 2019-05-10 DIAGNOSIS — M25552 Pain in left hip: Secondary | ICD-10-CM

## 2019-05-13 ENCOUNTER — Encounter: Payer: Self-pay | Admitting: Radiology

## 2019-07-19 ENCOUNTER — Encounter: Payer: Self-pay | Admitting: Family Medicine

## 2019-07-19 DIAGNOSIS — Z86010 Personal history of colonic polyps: Secondary | ICD-10-CM

## 2019-07-20 ENCOUNTER — Ambulatory Visit (INDEPENDENT_AMBULATORY_CARE_PROVIDER_SITE_OTHER): Payer: Medicare PPO | Admitting: Family Medicine

## 2019-07-20 ENCOUNTER — Other Ambulatory Visit: Payer: Self-pay

## 2019-07-20 ENCOUNTER — Encounter: Payer: Self-pay | Admitting: Family Medicine

## 2019-07-20 VITALS — BP 123/78 | HR 72 | Wt 164.3 lb

## 2019-07-20 DIAGNOSIS — Z01419 Encounter for gynecological examination (general) (routine) without abnormal findings: Secondary | ICD-10-CM

## 2019-07-20 NOTE — Progress Notes (Signed)
Mammogram 05/06/2019-normal  Last pap 11/13/2011

## 2019-07-20 NOTE — Progress Notes (Signed)
  Subjective:     Shannon [name] is a 69 y.o. female and is here for a comprehensive physical exam. The patient reports problems - urge incontinence. Has seen urology, worsened after bladder tacking. Using pessary for vaginal prolapse, removes nightly. S/p TVH with TVT in 2017. On Myrbetrique 50 mg daily.  The following portions of the patient's history were reviewed and updated as appropriate: allergies, current medications, past family history, past medical history, past social history, past surgical history and problem list.  Review of Systems Pertinent items noted in HPI and remainder of comprehensive ROS otherwise negative.   Objective:    BP 123/78   Pulse 72   Wt 164 lb 4.8 oz (74.5 kg)   BMI 24.44 kg/m  General appearance: alert, cooperative and appears stated age Head: Normocephalic, without obvious abnormality, atraumatic Neck: no adenopathy, supple, symmetrical, trachea midline and thyroid not enlarged, symmetric, no tenderness/mass/nodules Lungs: clear to auscultation bilaterally Heart: regular rate, occasional PAC noted, 2/6 murmur Abdomen: soft, non-tender; bowel sounds normal; no masses,  no organomegaly Extremities: Homans sign is negative, no sign of DVT Skin: Skin color, texture, turgor normal. No rashes or lesions Neurologic: Alert and oriented X 3, normal strength and tone. Normal symmetric reflexes. Normal coordination and gait    Assessment:    Healthy female exam.      Plan:     Encounter for gynecological examination without abnormal finding  Return in 1 year (on 07/19/2020).   See After Visit Summary for Counseling Recommendations

## 2019-07-20 NOTE — Patient Instructions (Signed)
Preventive Care 71 Years and Older, Female Preventive care refers to lifestyle choices and visits with your health care provider that can promote health and wellness. This includes:  A yearly physical exam. This is also called an annual well check.  Regular dental and eye exams.  Immunizations.  Screening for certain conditions.  Healthy lifestyle choices, such as diet and exercise. What can I expect for my preventive care visit? Physical exam Your health care provider will check:  Height and weight. These may be used to calculate body mass index (BMI), which is a measurement that tells if you are at a healthy weight.  Heart rate and blood pressure.  Your skin for abnormal spots. Counseling Your health care provider may ask you questions about:  Alcohol, tobacco, and drug use.  Emotional well-being.  Home and relationship well-being.  Sexual activity.  Eating habits.  History of falls.  Memory and ability to understand (cognition).  Work and work Statistician.  Pregnancy and menstrual history. What immunizations do I need?  Influenza (flu) vaccine  This is recommended every year. Tetanus, diphtheria, and pertussis (Tdap) vaccine  You may need a Td booster every 10 years. Varicella (chickenpox) vaccine  You may need this vaccine if you have not already been vaccinated. Zoster (shingles) vaccine  You may need this after age 71. Pneumococcal conjugate (PCV13) vaccine  One dose is recommended after age 71. Pneumococcal polysaccharide (PPSV23) vaccine  One dose is recommended after age 71. Measles, mumps, and rubella (MMR) vaccine  You may need at least one dose of MMR if you were born in 1957 or later. You may also need a second dose. Meningococcal conjugate (MenACWY) vaccine  You may need this if you have certain conditions. Hepatitis A vaccine  You may need this if you have certain conditions or if you travel or work in places where you may be exposed  to hepatitis A. Hepatitis B vaccine  You may need this if you have certain conditions or if you travel or work in places where you may be exposed to hepatitis B. Haemophilus influenzae type b (Hib) vaccine  You may need this if you have certain conditions. You may receive vaccines as individual doses or as more than one vaccine together in one shot (combination vaccines). Talk with your health care provider about the risks and benefits of combination vaccines. What tests do I need? Blood tests  Lipid and cholesterol levels. These may be checked every 5 years, or more frequently depending on your overall health.  Hepatitis C test.  Hepatitis B test. Screening  Lung cancer screening. You may have this screening every year starting at age 71 if you have a 30-pack-year history of smoking and currently smoke or have quit within the past 15 years.  Colorectal cancer screening. All adults should have this screening starting at age 71 and continuing until age 15. Your health care provider may recommend screening at age 71 if you are at increased risk. You will have tests every 1-10 years, depending on your results and the type of screening test.  Diabetes screening. This is done by checking your blood sugar (glucose) after you have not eaten for a while (fasting). You may have this done every 1-3 years.  Mammogram. This may be done every 1-2 years. Talk with your health care provider about how often you should have regular mammograms.  BRCA-related cancer screening. This may be done if you have a family history of breast, ovarian, tubal, or peritoneal cancers.  Other tests  Sexually transmitted disease (STD) testing.  Bone density scan. This is done to screen for osteoporosis. You may have this done starting at age 71. Follow these instructions at home: Eating and drinking  Eat a diet that includes fresh fruits and vegetables, whole grains, lean protein, and low-fat dairy products. Limit  your intake of foods with high amounts of sugar, saturated fats, and salt.  Take vitamin and mineral supplements as recommended by your health care provider.  Do not drink alcohol if your health care provider tells you not to drink.  If you drink alcohol: ? Limit how much you have to 0-1 drink a day. ? Be aware of how much alcohol is in your drink. In the [place] one drink equals one 12 oz bottle of beer (355 mL), one 5 oz glass of wine (148 mL), or one 1 oz glass of hard liquor (44 mL). Lifestyle  Take daily care of your teeth and gums.  Stay active. Exercise for at least 30 minutes on 5 or more days each week.  Do not use any products that contain nicotine or tobacco, such as cigarettes, e-cigarettes, and chewing tobacco. If you need help quitting, ask your health care provider.  If you are sexually active, practice safe sex. Use a condom or other form of protection in order to prevent STIs (sexually transmitted infections).  Talk with your health care provider about taking a low-dose aspirin or statin. What's next?  Go to your health care provider once a year for a well check visit.  Ask your health care provider how often you should have your eyes and teeth checked.  Stay up to date on all vaccines. This information is not intended to replace advice given to you by your health care provider. Make sure you discuss any questions you have with your health care provider. Document Revised: 01/01/2018 Document Reviewed: 01/01/2018 Elsevier Patient Education  2020 [employer].

## 2019-07-23 NOTE — Telephone Encounter (Signed)
Patient is requesting a referral for a Colonoscopy. Patient was due for a Colonoscopy in June.  Patient wants to switch from [facility] to [facility] in [place].  Patient said she prefers a Monday or Wednesday morning for the Colonoscopy.  If she needs an initial consult, patient can go anytime.

## 2019-07-27 NOTE — Telephone Encounter (Signed)
See Carrie's note

## 2019-07-27 NOTE — Telephone Encounter (Signed)
Referral done  Will send to [facility] 

## 2019-07-27 NOTE — Addendum Note (Signed)
Addended by: Loura Pardon A on: 07/27/2019 09:11 PM   Modules accepted: Orders

## 2019-08-05 ENCOUNTER — Telehealth (INDEPENDENT_AMBULATORY_CARE_PROVIDER_SITE_OTHER): Payer: Self-pay | Admitting: Gastroenterology

## 2019-08-05 ENCOUNTER — Other Ambulatory Visit: Payer: Self-pay

## 2019-08-05 DIAGNOSIS — Z86010 Personal history of colonic polyps: Secondary | ICD-10-CM

## 2019-08-05 MED ORDER — NA SULFATE-K SULFATE-MG SULF 17.5-3.13-1.6 GM/177ML PO SOLN: 1.0000 | Freq: Once | ORAL | 0 refills | Status: AC

## 2019-08-05 NOTE — Progress Notes (Signed)
Gastroenterology Pre-Procedure Review  Request Date: Wed 09/08/19 Requesting Physician: Dr. Bonna Gains  PATIENT REVIEW QUESTIONS: The patient responded to the following health history questions as indicated:    1. Are you having any GI issues? no 2. Do you have a personal history of Polyps? yes (07/01/16 performed by Dr. Ardis Hughs noted 4 sessile polyps) 3. Do you have a family history of Colon Cancer or Polyps? no 4. Diabetes Mellitus? no 5. Joint replacements in the past 12 months?No joints replaced however she had left foot surgery was performed in 08/12/18 and 12/30/18 6. Major health problems in the past 3 months?no 7. Any artificial heart valves, MVP, or defibrillator?no    MEDICATIONS & ALLERGIES:    Patient reports the following regarding taking any anticoagulation/antiplatelet therapy:   Plavix, Coumadin, Eliquis, Xarelto, Lovenox, Pradaxa, Brilinta, or Effient? no Aspirin? no  Patient confirms/reports the following medications:  Current Outpatient Medications  Medication Sig Dispense Refill  . conjugated estrogens (PREMARIN) vaginal cream Place vaginally 3 (three) times a week. 42.5 g 6  . famotidine (PEPCID) 20 MG tablet Take 20 mg by mouth daily as needed for heartburn or indigestion.    . Lactobacillus ([place] PRO-B PO) Take by mouth daily.    Marland Kitchen MAGNESIUM GLUCONATE PO Take by mouth daily.    . Melatonin 3 MG TABS Take by mouth at bedtime as needed.    . Multiple Vitamin (MULTIVITAMIN) tablet Take 1 tablet by mouth daily.      Marland Kitchen MYRBETRIQ 50 MG TB24 tablet TAKE ONE TABLET BY MOUTH DAILY 90 tablet 10  . St Johns Wort 300 MG TABS Take by mouth.    . Na Sulfate-K Sulfate-Mg Sulf 17.5-3.13-1.6 GM/177ML SOLN Take 1 kit by mouth once for 1 dose. 354 mL 0   No current facility-administered medications for this visit.    Patient confirms/reports the following allergies:  Allergies  Allergen Reactions  . Adhesive [Tape] Rash    (not sure if rash was from ointment or tape)  .  Alpha-Gal Rash    (red meat S/P tick bite)  . Neomycin-Bacitracin Zn-Polymyx Rash    Ointment    No orders of the defined types were placed in this encounter.   AUTHORIZATION INFORMATION Primary Insurance: 1D#: Group #:  Secondary Insurance: 1D#: Group #:  SCHEDULE INFORMATION: Date: 09/08/19 Time: Location:[facility]

## 2019-08-09 DIAGNOSIS — D2262 Melanocytic nevi of left upper limb, including shoulder: Secondary | ICD-10-CM | POA: Diagnosis not present

## 2019-08-09 DIAGNOSIS — L821 Other seborrheic keratosis: Secondary | ICD-10-CM | POA: Diagnosis not present

## 2019-08-09 DIAGNOSIS — D2272 Melanocytic nevi of left lower limb, including hip: Secondary | ICD-10-CM | POA: Diagnosis not present

## 2019-08-09 DIAGNOSIS — D225 Melanocytic nevi of trunk: Secondary | ICD-10-CM | POA: Diagnosis not present

## 2019-08-09 DIAGNOSIS — D2271 Melanocytic nevi of right lower limb, including hip: Secondary | ICD-10-CM | POA: Diagnosis not present

## 2019-08-09 DIAGNOSIS — L570 Actinic keratosis: Secondary | ICD-10-CM | POA: Diagnosis not present

## 2019-08-09 DIAGNOSIS — D2261 Melanocytic nevi of right upper limb, including shoulder: Secondary | ICD-10-CM | POA: Diagnosis not present

## 2019-08-09 DIAGNOSIS — X32XXXA Exposure to sunlight, initial encounter: Secondary | ICD-10-CM | POA: Diagnosis not present

## 2019-09-06 ENCOUNTER — Other Ambulatory Visit: Payer: Self-pay

## 2019-09-06 ENCOUNTER — Other Ambulatory Visit
Admission: RE | Admit: 2019-09-06 | Discharge: 2019-09-06 | Disposition: A | Discharge status: Home / Self Care | Payer: Medicare PPO | Source: Ambulatory Visit | Attending: Gastroenterology | Admitting: Gastroenterology

## 2019-09-06 DIAGNOSIS — Z01812 Encounter for preprocedural laboratory examination: Secondary | ICD-10-CM | POA: Insufficient documentation

## 2019-09-06 DIAGNOSIS — Z20822 Contact with and (suspected) exposure to covid-19: Secondary | ICD-10-CM | POA: Diagnosis not present

## 2019-09-06 LAB — SARS CORONAVIRUS 2 (TAT 6-24 HRS): SARS Coronavirus 2: NEGATIVE

## 2019-09-08 ENCOUNTER — Ambulatory Visit: Payer: Medicare PPO | Admitting: Certified Registered"

## 2019-09-08 ENCOUNTER — Encounter: Payer: Self-pay | Admitting: Gastroenterology

## 2019-09-08 ENCOUNTER — Ambulatory Visit
Admission: RE | Admit: 2019-09-08 | Discharge: 2019-09-08 | Disposition: A | Discharge status: Home / Self Care | Payer: Medicare PPO | Attending: Gastroenterology | Admitting: Gastroenterology

## 2019-09-08 ENCOUNTER — Other Ambulatory Visit: Payer: Self-pay

## 2019-09-08 ENCOUNTER — Encounter
Admission: RE | Disposition: A | Discharge status: Home / Self Care | Payer: Self-pay | Source: Home / Self Care | Attending: Gastroenterology

## 2019-09-08 DIAGNOSIS — D126 Benign neoplasm of colon, unspecified: Secondary | ICD-10-CM | POA: Insufficient documentation

## 2019-09-08 DIAGNOSIS — D123 Benign neoplasm of transverse colon: Secondary | ICD-10-CM | POA: Diagnosis not present

## 2019-09-08 DIAGNOSIS — Z79890 Hormone replacement therapy (postmenopausal): Secondary | ICD-10-CM | POA: Diagnosis not present

## 2019-09-08 DIAGNOSIS — Z1211 Encounter for screening for malignant neoplasm of colon: Secondary | ICD-10-CM | POA: Diagnosis not present

## 2019-09-08 DIAGNOSIS — Z86010 Personal history of colonic polyps: Secondary | ICD-10-CM | POA: Insufficient documentation

## 2019-09-08 DIAGNOSIS — Z79899 Other long term (current) drug therapy: Secondary | ICD-10-CM | POA: Diagnosis not present

## 2019-09-08 DIAGNOSIS — K219 Gastro-esophageal reflux disease without esophagitis: Secondary | ICD-10-CM | POA: Diagnosis not present

## 2019-09-08 DIAGNOSIS — D120 Benign neoplasm of cecum: Secondary | ICD-10-CM | POA: Diagnosis not present

## 2019-09-08 DIAGNOSIS — M722 Plantar fascial fibromatosis: Secondary | ICD-10-CM | POA: Diagnosis not present

## 2019-09-08 DIAGNOSIS — I471 Supraventricular tachycardia: Secondary | ICD-10-CM | POA: Diagnosis not present

## 2019-09-08 DIAGNOSIS — Z09 Encounter for follow-up examination after completed treatment for conditions other than malignant neoplasm: Secondary | ICD-10-CM | POA: Diagnosis present

## 2019-09-08 DIAGNOSIS — K635 Polyp of colon: Secondary | ICD-10-CM | POA: Diagnosis not present

## 2019-09-08 DIAGNOSIS — K2100 Gastro-esophageal reflux disease with esophagitis, without bleeding: Secondary | ICD-10-CM | POA: Diagnosis not present

## 2019-09-08 HISTORY — PX: COLONOSCOPY WITH PROPOFOL: SHX5780

## 2019-09-08 SURGERY — COLONOSCOPY WITH PROPOFOL
Anesthesia: General

## 2019-09-08 MED ORDER — PROPOFOL 500 MG/50ML IV EMUL
INTRAVENOUS | Status: DC | PRN
  Administered 2019-09-08: 155 ug/kg/min via INTRAVENOUS

## 2019-09-08 MED ORDER — PROPOFOL 10 MG/ML IV BOLUS
INTRAVENOUS | Status: DC | PRN
  Administered 2019-09-08: 50 mg via INTRAVENOUS
  Administered 2019-09-08 (×2): 10 mg via INTRAVENOUS

## 2019-09-08 MED ORDER — SODIUM CHLORIDE 0.9 % IV SOLN
INTRAVENOUS | Status: DC
  Administered 2019-09-08: 1000 mL via INTRAVENOUS

## 2019-09-08 MED ORDER — LIDOCAINE HCL (CARDIAC) PF 100 MG/5ML IV SOSY
PREFILLED_SYRINGE | INTRAVENOUS | Status: DC | PRN
  Administered 2019-09-08: 100 mg via INTRAVENOUS

## 2019-09-08 NOTE — Anesthesia Preprocedure Evaluation (Signed)
Anesthesia Evaluation  Patient identified by MRN, date of birth, ID band Patient awake    Reviewed: Allergy & Precautions, NPO status   History of Anesthesia Complications Negative for: history of anesthetic complications  Airway Mallampati: III  TM Distance: >3 FB Neck ROM: Full    Dental no notable dental hx. (+) Chipped   Pulmonary neg pulmonary ROS, neg shortness of breath,    Pulmonary exam normal        Cardiovascular Exercise Tolerance: Good (-) Past MI Normal cardiovascular exam+ dysrhythmias (PSVT)   Palpitations    Neuro/Psych Chronic back and neck pain    GI/Hepatic GERD  ,  Endo/Other    Renal/GU      Musculoskeletal  (+) Arthritis ,   Abdominal   Peds  Hematology   Anesthesia Other Findings Past Medical History: No date: Arthritis     Comment:  left foot No date: Blood transfusion without reported diagnosis No date: Chronic neck and back pain     Comment:  received physical therapy No date: Dysrhythmia     Comment:  Pt told she had "benign arrythmia" after wearing monitor No date: GERD (gastroesophageal reflux disease)     Comment:  with esophagitis No date: Motion sickness     Comment:  boats, planes No date: Numbness     Comment:  Right outer thigh, constant, notices more with standing               for a long period of time No date: Plantar fasciitis     Comment:  left   Reproductive/Obstetrics                             Anesthesia Physical  Anesthesia Plan  ASA: III  Anesthesia Plan: General   Post-op Pain Management:    Induction: Intravenous  PONV Risk Score and Plan: Propofol infusion and TIVA  Airway Management Planned: Natural Airway and Nasal Cannula  Additional Equipment:   Intra-op Plan:   Post-operative Plan: Extubation in OR  Informed Consent: I have reviewed the patients History and Physical, chart, labs and discussed the procedure  including the risks, benefits and alternatives for the proposed anesthesia with the patient or authorized representative who has indicated his/her understanding and acceptance.     Dental Advisory Given  Plan Discussed with: CRNA  Anesthesia Plan Comments: (Patient consented for risks of anesthesia including but not limited to:  - adverse reactions to medications - risk of intubation if required - damage to eyes, teeth, lips or other oral mucosa - nerve damage due to positioning  - sore throat or hoarseness - Damage to heart, brain, nerves, lungs, other parts of body or loss of life  Patient voiced understanding.)        Anesthesia Quick Evaluation

## 2019-09-08 NOTE — H&P (Signed)
Vonda Antigua, MD [address] [place], [place], [place], [address] [address], [place], [place], [place], [address] Phone: [phone]  Fax: [phone]  Primary Care Physician:  Tower, Wynelle Fanny, MD   Pre-Procedure History & Physical: HPI:  Shannon [name] is a 69 y.o. female is here for a colonoscopy.   Past Medical History:  Diagnosis Date  . Arthritis    left foot  . Blood transfusion without reported diagnosis   . Chronic neck and back pain    received physical therapy  . Dysrhythmia    Pt told she had "benign arrythmia" after wearing monitor  . GERD (gastroesophageal reflux disease)    with esophagitis  . Motion sickness    boats, planes  . Numbness    Right outer thigh, constant, notices more with standing for a long period of time  . Plantar fasciitis    left    Past Surgical History:  Procedure Laterality Date  . ARTHRODESIS METATARSAL Left 12/30/2018   Procedure: ARTHRODESIS,LISFRANC;MULTIPLE LEFT;  Surgeon: Samara Deist, DPM;  Location: [facility];  Service: Podiatry;  Laterality: Left;  general with local  . BLADDER SUSPENSION N/A 07/04/2015   Procedure: TRANSVAGINAL TAPE (TVT) SLING                   ;  Surgeon: Emily Filbert, MD;  Location: [facility];  Service: Gynecology;  Laterality: N/A;  . COLONOSCOPY    . CYSTOCELE REPAIR N/A 07/04/2015   Procedure: ANTERIOR REPAIR (CYSTOCELE);  Surgeon: Emily Filbert, MD;  Location: [facility];  Service: Gynecology;  Laterality: N/A;  . CYSTOSCOPY N/A 07/04/2015   Procedure: CYSTOSCOPY;  Surgeon: Emily Filbert, MD;  Location: [facility];  Service: Gynecology;  Laterality: N/A;  . DILATION AND CURETTAGE OF UTERUS    . EXCISION MORTON'S NEUROMA Left 08/12/2018   Procedure: EXCISION MORTON'S NEUROMA;  Surgeon: Samara Deist, DPM;  Location: [facility];  Service: Podiatry;  Laterality: Left;  lma local  . POLYPECTOMY    . TUBAL LIGATION  1987  . UPPER GI ENDOSCOPY    . VAGINAL HYSTERECTOMY N/A 07/04/2015    Procedure: TOTAL VAGINAL HYSTERECTOMY  ;  Surgeon: Emily Filbert, MD;  Location: [facility];  Service: Gynecology;  Laterality: N/A;    Prior to Admission medications   Medication Sig Start Date End Date Taking? Authorizing Provider  conjugated estrogens (PREMARIN) vaginal cream Place vaginally 3 (three) times a week. 06/12/18  Yes Dove, Myra C, MD  famotidine (PEPCID) 20 MG tablet Take 20 mg by mouth daily as needed for heartburn or indigestion.   Yes [provider]  Lactobacillus ([place] PRO-B PO) Take by mouth daily.   Yes [provider]  MAGNESIUM GLUCONATE PO Take by mouth daily.   Yes [provider]  Melatonin 3 MG TABS Take by mouth at bedtime as needed.   Yes [provider]  Multiple Vitamin (MULTIVITAMIN) tablet Take 1 tablet by mouth daily.     Yes [provider]  MYRBETRIQ 50 MG TB24 tablet TAKE ONE TABLET BY MOUTH DAILY 01/13/19  Yes McGowan, Hunt Oris, PA-C  St Johns Wort 300 MG TABS Take by mouth.   Yes [provider]    Allergies as of 08/05/2019 - Review Complete 08/05/2019  Allergen Reaction Noted  . Adhesive [tape] Rash 08/05/2018  . Alpha-gal Rash 08/05/2018  . Neomycin-bacitracin zn-polymyx Rash 10/20/2006    Family History  Problem Relation Age of Onset  .  Stroke Mother        x 2  . Heart disease Mother        congenital arrhythmia and CHF  . Depression Mother   . Atrial fibrillation Mother   . Prostate cancer Brother   . Atrial fibrillation Brother   . Stomach cancer Paternal Uncle   . Colon cancer Neg Hx   . Esophageal cancer Neg Hx   . Kidney cancer Neg Hx   . Bladder Cancer Neg Hx     Social History   Socioeconomic History  . Marital status: Married    Spouse name: Not on file  . Number of children: Not on file  . Years of education: Not on file  . Highest education level: Not on file  Occupational History  . Not on file  Tobacco Use  . Smoking status: Never Smoker  . Smokeless  tobacco: Never Used  Vaping Use  . Vaping Use: Never used  Substance and Sexual Activity  . Alcohol use: No    Alcohol/week: 0.0 standard drinks    Comment: rare  . Drug use: No  . Sexual activity: Yes    Birth control/protection: Post-menopausal, Surgical  Other Topics Concern  . Not on file  Social History Narrative  . Not on file   Social Determinants of Health   Financial Resource Strain: Low Risk   . Difficulty of Paying Living Expenses: Not hard at all  Food Insecurity: No Food Insecurity  . Worried About Charity fundraiser in the Last Year: Never true  . Ran Out of Food in the Last Year: Never true  Transportation Needs: No Transportation Needs  . Lack of Transportation (Medical): No  . Lack of Transportation (Non-Medical): No  Physical Activity: Sufficiently Active  . Days of Exercise per Week: 7 days  . Minutes of Exercise per Session: 60 min  Stress: No Stress Concern Present  . Feeling of Stress : Not at all  Social Connections:   . Frequency of Communication with Friends and Family:   . Frequency of Social Gatherings with Friends and Family:   . Attends Religious Services:   . Active Member of Clubs or Organizations:   . Attends Archivist Meetings:   Marland Kitchen Marital Status:   Intimate Partner Violence: Not At Risk  . Fear of Current or Ex-Partner: No  . Emotionally Abused: No  . Physically Abused: No  . Sexually Abused: No    Review of Systems: See HPI, otherwise negative ROS  Physical Exam: BP 121/69   Pulse 67   Temp 98.4 F (36.9 C) (Oral)   Resp 18   Ht 5\' 9"  (1.753 m)   Wt 72.6 kg   SpO2 100%   BMI 23.63 kg/m  General:   Alert,  pleasant and cooperative in NAD Head:  Normocephalic and atraumatic. Neck:  Supple; no masses or thyromegaly. Lungs:  Clear throughout to auscultation, normal respiratory effort.    Heart:  +S1, +S2, Regular rate and rhythm, No edema. Abdomen:  Soft, nontender and nondistended. Normal bowel sounds, without  guarding, and without rebound.   Neurologic:  Alert and  oriented x4;  grossly normal neurologically.  Impression/Plan: Shannon [name] is here for a colonoscopy to be performed for history of adenoma polyps.  Risks, benefits, limitations, and alternatives regarding  colonoscopy have been reviewed with the patient.  Questions have been answered.  All parties agreeable.   Virgel Manifold, MD  09/08/2019, 10:26 AM

## 2019-09-08 NOTE — Anesthesia Procedure Notes (Signed)
Procedure Name: General with mask airway °Performed by: Fletcher-Harrison, Danyon Mcginness, CRNA °Pre-anesthesia Checklist: Patient identified, Emergency Drugs available, Suction available and Patient being monitored °Oxygen Delivery Method: Simple face mask °Induction Type: IV induction °Placement Confirmation: positive ETCO2 and CO2 detector °Dental Injury: Teeth and Oropharynx as per pre-operative assessment  ° ° ° ° ° ° °

## 2019-09-08 NOTE — Op Note (Signed)
[facility] Gastroenterology Patient Name: Shannon [name] Procedure Date: 09/08/2019 10:34 AM MRN: [id number] Account #: [id number] Date of Birth: 03/19/1950 Admit Type: Outpatient Age: 69 Room: [facility] ENDO ROOM 4 Gender: Female Note Status: Finalized Procedure:             Colonoscopy Indications:           High risk colon cancer surveillance: Personal history                         of colonic polyps Providers:             Wiktoria Hemrick B. Bonna Gains MD, MD Referring MD:          Wynelle Fanny. Tower (Referring MD) Medicines:             Monitored Anesthesia Care Complications:         No immediate complications. Procedure:             Pre-Anesthesia Assessment:                        - ASA Grade Assessment: II - A patient with mild                         systemic disease.                        - Prior to the procedure, a History and Physical was                         performed, and patient medications, allergies and                         sensitivities were reviewed. The patient's tolerance                         of previous anesthesia was reviewed.                        - The risks and benefits of the procedure and the                         sedation options and risks were discussed with the                         patient. All questions were answered and informed                         consent was obtained.                        - Patient identification and proposed procedure were                         verified prior to the procedure by the physician, the                         nurse, the anesthesiologist, the anesthetist and the                         technician. The procedure was  verified in the                         procedure room.                        After obtaining informed consent, the colonoscope was                         passed under direct vision. Throughout the procedure,                         the patient's blood pressure, pulse, and oxygen                          saturations were monitored continuously. The                         Colonoscope was introduced through the anus and                         advanced to the the cecum, identified by appendiceal                         orifice and ileocecal valve. The colonoscopy was                         performed with ease. The patient tolerated the                         procedure well. The quality of the bowel preparation                         was good. Findings:      The perianal and digital rectal examinations were normal.      A 2 mm polyp was found in the cecum. The polyp was sessile. The polyp       was removed with a jumbo cold forceps. Resection and retrieval were       complete.      Five sessile polyps were found in the transverse colon. The polyps were       4 to 5 mm in size. These polyps were removed with a cold snare.       Resection and retrieval were complete.      The exam was otherwise without abnormality.      The rectum, sigmoid colon, descending colon, transverse colon, ascending       colon and cecum appeared normal.      Retroflexion in the rectum was not performed due to Narrow rectum.       Careful frontal view of the rectum was otherwise normal. Impression:            - One 2 mm polyp in the cecum, removed with a jumbo                         cold forceps. Resected and retrieved.                        - Five 4 to 5 mm polyps in the transverse colon,  removed with a cold snare. Resected and retrieved.                        - The examination was otherwise normal.                        - The rectum, sigmoid colon, descending colon,                         transverse colon, ascending colon and cecum are normal. Recommendation:        - Await pathology results.                        - Discharge patient to home (with escort).                        - Advance diet as tolerated.                        - Continue present  medications.                        - Repeat colonoscopy in 3 years.                        - The findings and recommendations were discussed with                         the patient.                        - The findings and recommendations were discussed with                         the patient's family.                        - Return to primary care physician as previously                         scheduled. Procedure Code(s):     --- Professional ---                        [phone], Colonoscopy, flexible; with removal of                         tumor(s), polyp(s), or other lesion(s) by snare                         technique                        45380, 12, Colonoscopy, flexible; with biopsy, single                         or multiple Diagnosis Code(s):     --- Professional ---                        K63.5, Polyp of colon  Z86.010, Personal history of colonic polyps CPT copyright 2019 [employer]. All rights reserved. The codes documented in this report are preliminary and upon coder review may  be revised to meet current compliance requirements.  Vonda Antigua, MD Margretta Sidle B. Bonna Gains MD, MD 09/08/2019 11:23:55 AM This report has been signed electronically. Number of Addenda: 0 Note Initiated On: 09/08/2019 10:34 AM Scope Withdrawal Time: 0 hours 20 minutes 8 seconds  Total Procedure Duration: 0 hours 29 minutes 40 seconds  Estimated Blood Loss:  Estimated blood loss: none.      [facility]

## 2019-09-08 NOTE — Anesthesia Postprocedure Evaluation (Signed)
Anesthesia Post Note  Patient: [name] Wierman  Procedure(s) Performed: COLONOSCOPY WITH PROPOFOL (N/A )  Patient location during evaluation: Endoscopy Anesthesia Type: General Level of consciousness: awake and alert Pain management: pain level controlled Vital Signs Assessment: post-procedure vital signs reviewed and stable Respiratory status: spontaneous breathing, nonlabored ventilation, respiratory function stable and patient connected to nasal cannula oxygen Cardiovascular status: blood pressure returned to baseline and stable Postop Assessment: no apparent nausea or vomiting Anesthetic complications: no   No complications documented.   Last Vitals:  Vitals:   09/08/19 1141 09/08/19 1151  BP: 110/79 96/67  Pulse:    Resp:    Temp:    SpO2:      Last Pain:  Vitals:   09/08/19 1131  TempSrc:   PainSc: 0-No pain                 Precious Haws Tarnesha Ulloa

## 2019-09-08 NOTE — Transfer of Care (Signed)
Immediate Anesthesia Transfer of Care Note  Patient: Shannon [name] Ward Bedwell  Procedure(s) Performed: COLONOSCOPY WITH PROPOFOL (N/A )  Patient Location: Endoscopy Unit  Anesthesia Type:General  Level of Consciousness: awake and drowsy  Airway & Oxygen Therapy: Patient Spontanous Breathing and Patient connected to face mask oxygen  Post-op Assessment: Report given to RN and Post -op Vital signs reviewed and stable  Post vital signs: Reviewed and stable  Last Vitals:  Vitals Value Taken Time  BP 98/51 09/08/19 1121  Temp 36.7 C 09/08/19 1121  Pulse 56 09/08/19 1124  Resp 15 09/08/19 1124  SpO2 100 % 09/08/19 1124  Vitals shown include unvalidated device data.  Last Pain:  Vitals:   09/08/19 1121  TempSrc: Temporal  PainSc: 0-No pain         Complications: No complications documented.

## 2019-09-09 ENCOUNTER — Encounter: Payer: Self-pay | Admitting: Gastroenterology

## 2019-09-09 LAB — SURGICAL PATHOLOGY

## 2019-09-13 ENCOUNTER — Encounter: Payer: Self-pay | Admitting: Gastroenterology

## 2019-11-08 NOTE — Progress Notes (Signed)
11/09/2019 12:01 PM   [name] Reinhold 17-Mar-1950 [id number]  Referring provider: Abner Greenspan, MD [address] [place],  [place] [address]  Chief Complaint  Patient presents with  . Follow-up    HPI: 69 y/o female with a personal history of recurrent UTI's, vaginal atrophy and urgency who presents today for a follow up.    Personal hx of rUTI's Risk factors: age, vaginal atrophy, constipation and incontinence. Documented infections:   + E.coli resistant to ampicillin on 12/21/2014  + E. Coli on 12/06/2015  + E. Coli on 08/04/2018  TVT Sling by gynecologist in 06/2015 during hysterectomy with anterior repair   Contrast CT in 11/2016 by ED for abdominal pain on 12/08/2016 noted bilateral renal parapelvic cysts. No hydronephrosis. Adrenal glands and urinary bladder unremarkable.  Moderate to large stool burden throughout the colon.   Asymptomatic at this visit.   Vaginal atrophy Spoke with her gynecologist and she is now applying the cream 2 nights weekly  Urgency She is experiencing urgency x 0-3 (unchanged), frequency x 8 or more (unchanged), not restricting fluids to avoid visits to the restroom, is engaging in toilet mapping, incontinence x 4-7 (worse) and nocturia x 0-3 (unchange).  Her main complaints today are frequency, urgency and incontinence.  She denies any gross hematuria, dysuria or suprapubic pain. She is not any fevers, chills, nausea or vomiting. Her PVR today is 45mL.  Her blood pressure is 111/65.    She had researched Botox treatment for her bladder since her last visit with [name] and has decided against it due to the invasiveness of the procedure, the risk of recurrent UTIs and the risk of going into urinary retention and having to self catheterize.  PMH: Past Medical History:  Diagnosis Date  . Arthritis    left foot  . Blood transfusion without reported diagnosis   . Chronic neck and back pain    received physical therapy  . Dysrhythmia     Pt told she had "benign arrythmia" after wearing monitor  . GERD (gastroesophageal reflux disease)    with esophagitis  . Motion sickness    boats, planes  . Numbness    Right outer thigh, constant, notices more with standing for a long period of time  . Plantar fasciitis    left    Surgical History: Past Surgical History:  Procedure Laterality Date  . ARTHRODESIS METATARSAL Left 12/30/2018   Procedure: ARTHRODESIS,LISFRANC;MULTIPLE LEFT;  Surgeon: Samara Deist, DPM;  Location: [facility];  Service: Podiatry;  Laterality: Left;  general with local  . BLADDER SUSPENSION N/A 07/04/2015   Procedure: TRANSVAGINAL TAPE (TVT) SLING                   ;  Surgeon: Emily Filbert, MD;  Location: [facility];  Service: Gynecology;  Laterality: N/A;  . COLONOSCOPY    . COLONOSCOPY WITH PROPOFOL N/A 09/08/2019   Procedure: COLONOSCOPY WITH PROPOFOL;  Surgeon: Virgel Manifold, MD;  Location: [facility];  Service: Endoscopy;  Laterality: N/A;  . CYSTOCELE REPAIR N/A 07/04/2015   Procedure: ANTERIOR REPAIR (CYSTOCELE);  Surgeon: Emily Filbert, MD;  Location: [facility];  Service: Gynecology;  Laterality: N/A;  . CYSTOSCOPY N/A 07/04/2015   Procedure: CYSTOSCOPY;  Surgeon: Emily Filbert, MD;  Location: [facility];  Service: Gynecology;  Laterality: N/A;  . DILATION AND CURETTAGE OF UTERUS    . EXCISION MORTON'S NEUROMA Left 08/12/2018   Procedure: EXCISION MORTON'S NEUROMA;  Surgeon:  Samara Deist, DPM;  Location: [facility];  Service: Podiatry;  Laterality: Left;  lma local  . POLYPECTOMY    . TUBAL LIGATION  1987  . UPPER GI ENDOSCOPY    . VAGINAL HYSTERECTOMY N/A 07/04/2015   Procedure: TOTAL VAGINAL HYSTERECTOMY  ;  Surgeon: Emily Filbert, MD;  Location: [facility];  Service: Gynecology;  Laterality: N/A;    Home Medications:  Allergies as of 11/09/2019      Reactions   Adhesive [tape] Rash   (not sure if rash was from ointment or tape)   Alpha-gal Rash   (red meat S/P tick bite)    Neomycin-bacitracin Zn-polymyx Rash   Ointment      Medication List       Accurate as of November 09, 2019 12:01 PM. If you have any questions, ask your nurse or doctor.        conjugated estrogens vaginal cream Commonly known as: PREMARIN Place vaginally 3 (three) times a week.   famotidine 20 MG tablet Commonly known as: PEPCID Take 20 mg by mouth daily as needed for heartburn or indigestion.   MAGNESIUM GLUCONATE PO Take by mouth daily.   melatonin 3 MG Tabs tablet Take by mouth at bedtime as needed.   multivitamin tablet Take 1 tablet by mouth daily.   Myrbetriq 50 MG Tb24 tablet Generic drug: mirabegron ER TAKE ONE TABLET BY MOUTH DAILY   REPHRESH PRO-B PO Take by mouth daily.   St Johns Wort 300 MG Tabs Take by mouth.       Allergies:  Allergies  Allergen Reactions  . Adhesive [Tape] Rash    (not sure if rash was from ointment or tape)  . Alpha-Gal Rash    (red meat S/P tick bite)  . Neomycin-Bacitracin Zn-Polymyx Rash    Ointment    Family History: Family History  Problem Relation Age of Onset  . Stroke Mother        x 2  . Heart disease Mother        congenital arrhythmia and CHF  . Depression Mother   . Atrial fibrillation Mother   . Prostate cancer Brother   . Atrial fibrillation Brother   . Stomach cancer Paternal Uncle   . Colon cancer Neg Hx   . Esophageal cancer Neg Hx   . Kidney cancer Neg Hx   . Bladder Cancer Neg Hx     Social History:  reports that she has never smoked. She has never used smokeless tobacco. She reports that she does not drink alcohol and does not use drugs.  ROS: For pertinent review of systems please refer to history of present illness  Physical Exam: BP 111/65 (BP Location: Left Arm, Patient Position: Sitting, Cuff Size: Normal)   Pulse 69   Ht 5\' 8"  (1.727 m)   Wt 166 lb 11.2 oz (75.6 kg)   BMI 25.35 kg/m   Constitutional:  Well nourished. Alert and oriented, No acute distress. HEENT: [place] AT, mask  in place.  Trachea midline, no masses. Cardiovascular: No clubbing, cyanosis, or edema. Respiratory: Normal respiratory effort, no increased work of breathing. GU: No CVA tenderness.  No bladder fullness or masses.  Normal external genitalia, sparse pubic hair distribution, no lesions.  Normal urethral meatus, no lesions, no prolapse, no discharge.   No urethral masses, tenderness and/or tenderness. No bladder fullness, tenderness or masses. Pink vagina mucosa, good estrogen effect, no discharge, no lesions, Pessary in place.  Anus and perineum are without rashes  or lesions.    Skin: No rashes, bruises or suspicious lesions. Lymph: No inguinal adenopathy. Neurologic: Grossly intact, no focal deficits, moving all 4 extremities. Psychiatric: Normal mood and affect.   Laboratory Data: Lab Results  Component Value Date   WBC 4.2 04/26/2019   HGB 13.7 04/26/2019   HCT 40.5 04/26/2019   MCV 91.5 04/26/2019   PLT 239.0 04/26/2019    Lab Results  Component Value Date   CREATININE 0.77 04/26/2019    Lab Results  Component Value Date   TSH 0.91 04/26/2019       Component Value Date/Time   CHOL 164 04/26/2019 0800   HDL 76.70 04/26/2019 0800   CHOLHDL 2 04/26/2019 0800   VLDL 8.6 04/26/2019 0800   LDLCALC 79 04/26/2019 0800    Lab Results  Component Value Date   AST 19 04/26/2019   Lab Results  Component Value Date   ALT 14 04/26/2019   I have reviewed the labs  Pertinent Imaging: Results for TEJAH, BREKKE (MRN [id number]) as of 11/09/2019 11:57  Ref. Range 11/09/2019 11:29  Scan Result Unknown 64mL    Assessment & Plan:    1. History of recurrent UTI's Currently asymptomatic Taking probiotics and using vaginal estrogen cream   2. Vaginal atrophy She is on a estrogen cream two nights weekly  3. OAB/Urgency Continue Myrbetriq 50 mg daily Continue pessary We discussed PTNS therapy as she has decided against Botox.  She did express interest, but she would  like to do further research on the PTNS therapy prior to making her decision If she decides to go forward with PTNS therapy, she will contact the office so that we may do a prior approval with her insurance company prior to scheduling Otherwise she will return in 1 year for OAB questionnaire, PVR and exam                                      Return in about 1 year (around 11/08/2020) for OAB questionnaire, PVR and exam.  These notes generated with voice recognition software. I apologize for typographical errors.  Zara Council, PA-C  [facility] [address] [place], [place] [address] [phone]

## 2019-11-09 ENCOUNTER — Ambulatory Visit: Payer: Medicare PPO | Admitting: Urology

## 2019-11-09 ENCOUNTER — Other Ambulatory Visit: Payer: Self-pay

## 2019-11-09 ENCOUNTER — Encounter: Payer: Self-pay | Admitting: Urology

## 2019-11-09 VITALS — BP 111/65 | HR 69 | Ht 68.0 in | Wt 166.7 lb

## 2019-11-09 DIAGNOSIS — R3915 Urgency of urination: Secondary | ICD-10-CM

## 2019-11-09 DIAGNOSIS — N952 Postmenopausal atrophic vaginitis: Secondary | ICD-10-CM

## 2019-11-09 LAB — BLADDER SCAN AMB NON-IMAGING

## 2019-11-19 ENCOUNTER — Telehealth: Payer: Self-pay

## 2019-11-19 NOTE — Telephone Encounter (Signed)
I faxed them again today . Still waiting on Auth

## 2019-11-19 NOTE — Telephone Encounter (Signed)
Patient called and left a vmail wanting to know the status of PTNS prior auth, please advise

## 2019-11-23 ENCOUNTER — Telehealth: Payer: Self-pay | Admitting: *Deleted

## 2019-11-23 NOTE — Telephone Encounter (Signed)
Talked with [employer] this morning .They denied PTNS due to not medical necessary per [facility]. Talk with patient about this too.

## 2019-12-09 DIAGNOSIS — H2513 Age-related nuclear cataract, bilateral: Secondary | ICD-10-CM | POA: Diagnosis not present

## 2020-01-04 DIAGNOSIS — M19072 Primary osteoarthritis, left ankle and foot: Secondary | ICD-10-CM | POA: Diagnosis not present

## 2020-03-28 ENCOUNTER — Other Ambulatory Visit: Payer: Self-pay | Admitting: Family Medicine

## 2020-03-28 DIAGNOSIS — Z1231 Encounter for screening mammogram for malignant neoplasm of breast: Secondary | ICD-10-CM

## 2020-03-31 ENCOUNTER — Ambulatory Visit: Payer: Medicare PPO | Admitting: Urology

## 2020-03-31 ENCOUNTER — Encounter: Payer: Self-pay | Admitting: Urology

## 2020-03-31 ENCOUNTER — Other Ambulatory Visit: Payer: Self-pay

## 2020-03-31 VITALS — BP 97/59 | HR 67 | Ht 68.0 in | Wt 166.0 lb

## 2020-03-31 DIAGNOSIS — R3129 Other microscopic hematuria: Secondary | ICD-10-CM

## 2020-03-31 DIAGNOSIS — N3001 Acute cystitis with hematuria: Secondary | ICD-10-CM | POA: Diagnosis not present

## 2020-03-31 DIAGNOSIS — R3915 Urgency of urination: Secondary | ICD-10-CM | POA: Diagnosis not present

## 2020-03-31 LAB — URINALYSIS, COMPLETE
Bilirubin, UA: NEGATIVE
Glucose, UA: NEGATIVE
Ketones, UA: NEGATIVE
Nitrite, UA: POSITIVE — AB
Specific Gravity, UA: 1.025 (ref 1.005–1.030)
Urobilinogen, Ur: 0.2 mg/dL (ref 0.2–1.0)
pH, UA: 5.5 (ref 5.0–7.5)

## 2020-03-31 LAB — BLADDER SCAN AMB NON-IMAGING: Scan Result: 5

## 2020-03-31 LAB — MICROSCOPIC EXAMINATION: WBC, UA: >30 /hpf — AB (ref 0–5)

## 2020-03-31 MED ORDER — NITROFURANTOIN MONOHYD MACRO 100 MG PO CAPS: 100.0000 mg | ORAL_CAPSULE | Freq: Two times a day (BID) | ORAL | 0 refills | Status: DC

## 2020-03-31 NOTE — Progress Notes (Signed)
03/31/2020 3:12 PM   Shannon [name] Jul 14, 1950 [id number]  Referring provider: Abner Greenspan, MD [address] [place],  [place] [address]  Chief Complaint  Patient presents with  . Recurrent UTI   Urological history: 1. rUTI's -contributing factors of age, incontinence, constipation and vaginal atrophy -Positive documented urine cultures over the last year  None -managed with increased fluid intake, good perineal hygiene, avoiding tub baths and vaginal estrogen cream -UA nitrite positive, >30 WBC's, 3-10 RBC's and moderate bacteria  2. OAB/Urgency -managed with Myrbetriq 50 mg daily -cysto 10/2018 NED  -PVR 5 mL  3. Incontinence -managed with pessary     HPI: Shannon [name] is a 70 y.o. female who presents today for symptoms of an UTI.    She states that she had an increase in urinary urgency that started last evening and has increasingly worsened throughout the day.  She is having frequency, urgency, dysuria, incontinence and lower abdominal pain.  Patient denies any modifying or aggravating factors.  Patient denies any gross hematuria or flank pain.  Patient denies any fevers, chills, nausea or vomiting.   UA grossly infected.  PMH: Past Medical History:  Diagnosis Date  . Arthritis    left foot  . Blood transfusion without reported diagnosis   . Chronic neck and back pain    received physical therapy  . Dysrhythmia    Pt told she had "benign arrythmia" after wearing monitor  . GERD (gastroesophageal reflux disease)    with esophagitis  . Motion sickness    boats, planes  . Numbness    Right outer thigh, constant, notices more with standing for a long period of time  . Plantar fasciitis    left    Surgical History: Past Surgical History:  Procedure Laterality Date  . ARTHRODESIS METATARSAL Left 12/30/2018   Procedure: ARTHRODESIS,LISFRANC;MULTIPLE LEFT;  Surgeon: Samara Deist, DPM;  Location: [facility];  Service:  Podiatry;  Laterality: Left;  general with local  . BLADDER SUSPENSION N/A 07/04/2015   Procedure: TRANSVAGINAL TAPE (TVT) SLING                   ;  Surgeon: Emily Filbert, MD;  Location: [facility];  Service: Gynecology;  Laterality: N/A;  . COLONOSCOPY    . COLONOSCOPY WITH PROPOFOL N/A 09/08/2019   Procedure: COLONOSCOPY WITH PROPOFOL;  Surgeon: Virgel Manifold, MD;  Location: [facility];  Service: Endoscopy;  Laterality: N/A;  . CYSTOCELE REPAIR N/A 07/04/2015   Procedure: ANTERIOR REPAIR (CYSTOCELE);  Surgeon: Emily Filbert, MD;  Location: [facility];  Service: Gynecology;  Laterality: N/A;  . CYSTOSCOPY N/A 07/04/2015   Procedure: CYSTOSCOPY;  Surgeon: Emily Filbert, MD;  Location: [facility];  Service: Gynecology;  Laterality: N/A;  . DILATION AND CURETTAGE OF UTERUS    . EXCISION MORTON'S NEUROMA Left 08/12/2018   Procedure: EXCISION MORTON'S NEUROMA;  Surgeon: Samara Deist, DPM;  Location: [facility];  Service: Podiatry;  Laterality: Left;  lma local  . POLYPECTOMY    . TUBAL LIGATION  1987  . UPPER GI ENDOSCOPY    . VAGINAL HYSTERECTOMY N/A 07/04/2015   Procedure: TOTAL VAGINAL HYSTERECTOMY  ;  Surgeon: Emily Filbert, MD;  Location: [facility];  Service: Gynecology;  Laterality: N/A;    Home Medications:  Allergies as of 03/31/2020      Reactions   Adhesive [tape] Rash   (not sure if rash was from ointment or tape)  Alpha-gal Rash   (red meat S/P tick bite)   Neomycin-bacitracin Zn-polymyx Rash   Ointment      Medication List       Accurate as of March 31, 2020 11:59 PM. If you have any questions, ask your nurse or doctor.        Alpha-Lipoic Acid 600 MG Caps   b complex vitamins capsule   conjugated estrogens vaginal cream Commonly known as: PREMARIN Place vaginally 3 (three) times a week.   famotidine 20 MG tablet Commonly known as: PEPCID Take 20 mg by mouth daily as needed for heartburn or indigestion.   MAGNESIUM GLUCONATE PO Take by mouth daily.   melatonin  3 MG Tabs tablet Take by mouth at bedtime as needed.   multivitamin tablet Take 1 tablet by mouth daily.   Myrbetriq 50 MG Tb24 tablet Generic drug: mirabegron ER TAKE ONE TABLET BY MOUTH DAILY   nitrofurantoin (macrocrystal-monohydrate) 100 MG capsule Commonly known as: MACROBID Take 1 capsule (100 mg total) by mouth every 12 (twelve) hours. Started by: Zara Council, PA-C   [place] PRO-B PO Take by mouth daily.   St Johns Wort 300 MG Tabs Take by mouth.       Allergies:  Allergies  Allergen Reactions  . Adhesive [Tape] Rash    (not sure if rash was from ointment or tape)  . Alpha-Gal Rash    (red meat S/P tick bite)  . Neomycin-Bacitracin Zn-Polymyx Rash    Ointment    Family History: Family History  Problem Relation Age of Onset  . Stroke Mother        x 2  . Heart disease Mother        congenital arrhythmia and CHF  . Depression Mother   . Atrial fibrillation Mother   . Prostate cancer Brother   . Atrial fibrillation Brother   . Stomach cancer Paternal Uncle   . Colon cancer Neg Hx   . Esophageal cancer Neg Hx   . Kidney cancer Neg Hx   . Bladder Cancer Neg Hx     Social History:  reports that she has never smoked. She has never used smokeless tobacco. She reports that she does not drink alcohol and does not use drugs.  ROS: Pertinent ROS in HPI  Physical Exam: BP (!) 97/59   Pulse 67   Ht 5\' 8"  (1.727 m)   Wt 166 lb (75.3 kg)   BMI 25.24 kg/m   Constitutional:  Well nourished. Alert and oriented, No acute distress. HEENT: [place] AT, mask in place.  Trachea midline, no masses. Cardiovascular: No clubbing, cyanosis, or edema. Respiratory: Normal respiratory effort, no increased work of breathing. Neurologic: Grossly intact, no focal deficits, moving all 4 extremities. Psychiatric: Normal mood and affect.   Laboratory Data: Lab Results  Component Value Date   WBC 4.2 04/26/2019   HGB 13.7 04/26/2019   HCT 40.5 04/26/2019   MCV 91.5  04/26/2019   PLT 239.0 04/26/2019    Lab Results  Component Value Date   CREATININE 0.77 04/26/2019    Lab Results  Component Value Date   HGBA1C 6.1 04/26/2019    Lab Results  Component Value Date   TSH 0.91 04/26/2019       Component Value Date/Time   CHOL 164 04/26/2019 0800   HDL 76.70 04/26/2019 0800   CHOLHDL 2 04/26/2019 0800   VLDL 8.6 04/26/2019 0800   LDLCALC 79 04/26/2019 0800    Lab Results  Component Value Date  AST 19 04/26/2019   Lab Results  Component Value Date   ALT 14 04/26/2019     Urinalysis Component     Latest Ref Rng & Units 03/31/2020  Specific Gravity, UA     1.005 - 1.030 1.025  pH, UA     5.0 - 7.5 5.5  Color, UA     Yellow Yellow  Appearance Ur     Clear Cloudy (A)  Leukocytes,UA     Negative 2+ (A)  Protein,UA     Negative/Trace Trace (A)  Glucose, UA     Negative Negative  Ketones, UA     Negative Negative  RBC, UA     Negative 2+ (A)  Bilirubin, UA     Negative Negative  Urobilinogen, Ur     0.2 - 1.0 mg/dL 0.2  Nitrite, UA     Negative Positive (A)  Microscopic Examination      See below:   Component     Latest Ref Rng & Units 03/31/2020  WBC, UA     0 - 5 /hpf >30 (A)  RBC     0 - 2 /hpf 3-10 (A)  Epithelial Cells (non renal)     0 - 10 /hpf 0-10  Bacteria, UA     None seen/Few Moderate (A)  I have reviewed the labs.   Pertinent Imaging: Results for CHE, BELOW (MRN [id number]) as of 04/17/2020 15:05  Ref. Range 03/31/2020 09:38  Scan Result Unknown 5    Assessment & Plan:    1. UTI  - UA suspicious for infection - Urine sent for culture - prescribed Macrobid  - Urinalysis, Complete - Bladder Scan (Post Void Residual) in office  2. Microscopic hematuria - likely due to infection - will need to recheck in one month to ensure micro heme resolves after the infection has been treated - if micro heme persists or culture is negative, will need to pursue CT urogram    Return in about  1 month (around 05/01/2020) for recheck UA .  These notes generated with voice recognition software. I apologize for typographical errors.  Zara Council, PA-C  [facility] [address]  [place], [place] [address] [phone]

## 2020-04-06 LAB — CULTURE, URINE COMPREHENSIVE

## 2020-04-10 ENCOUNTER — Other Ambulatory Visit: Payer: Self-pay | Admitting: Urology

## 2020-05-01 ENCOUNTER — Other Ambulatory Visit: Payer: Self-pay | Admitting: Family Medicine

## 2020-05-01 DIAGNOSIS — R3129 Other microscopic hematuria: Secondary | ICD-10-CM

## 2020-05-03 ENCOUNTER — Other Ambulatory Visit: Payer: Medicare PPO

## 2020-05-03 ENCOUNTER — Other Ambulatory Visit: Payer: Self-pay

## 2020-05-03 DIAGNOSIS — R3129 Other microscopic hematuria: Secondary | ICD-10-CM | POA: Diagnosis not present

## 2020-05-04 LAB — URINALYSIS, COMPLETE
Bilirubin, UA: NEGATIVE
Glucose, UA: NEGATIVE
Leukocytes,UA: NEGATIVE
Nitrite, UA: NEGATIVE
Protein,UA: NEGATIVE
RBC, UA: NEGATIVE
Specific Gravity, UA: 1.025 (ref 1.005–1.030)
Urobilinogen, Ur: 0.2 mg/dL (ref 0.2–1.0)
pH, UA: 5.5 (ref 5.0–7.5)

## 2020-05-04 LAB — MICROSCOPIC EXAMINATION: Bacteria, UA: NONE SEEN

## 2020-05-09 ENCOUNTER — Telehealth: Payer: Self-pay | Admitting: *Deleted

## 2020-05-09 NOTE — Telephone Encounter (Signed)
Pharmacy called for refill on conjugated estrogen cream X 1 refill.

## 2020-05-12 DIAGNOSIS — L570 Actinic keratosis: Secondary | ICD-10-CM | POA: Diagnosis not present

## 2020-05-17 ENCOUNTER — Encounter: Payer: Self-pay | Admitting: Radiology

## 2020-05-19 ENCOUNTER — Other Ambulatory Visit: Payer: Self-pay

## 2020-05-19 ENCOUNTER — Ambulatory Visit
Admission: RE | Admit: 2020-05-19 | Discharge: 2020-05-19 | Disposition: A | Discharge status: Home / Self Care | Payer: Medicare PPO | Source: Ambulatory Visit | Attending: Family Medicine | Admitting: Family Medicine

## 2020-05-19 DIAGNOSIS — Z1231 Encounter for screening mammogram for malignant neoplasm of breast: Secondary | ICD-10-CM

## 2020-06-06 ENCOUNTER — Encounter: Payer: Self-pay | Admitting: Family Medicine

## 2020-06-06 ENCOUNTER — Other Ambulatory Visit: Payer: Self-pay

## 2020-06-06 ENCOUNTER — Ambulatory Visit (INDEPENDENT_AMBULATORY_CARE_PROVIDER_SITE_OTHER): Payer: Medicare PPO | Admitting: Family Medicine

## 2020-06-06 VITALS — BP 100/60 | HR 76 | Temp 98.7°F | Ht 68.25 in | Wt 169.1 lb

## 2020-06-06 DIAGNOSIS — H6123 Impacted cerumen, bilateral: Secondary | ICD-10-CM

## 2020-06-06 DIAGNOSIS — R739 Hyperglycemia, unspecified: Secondary | ICD-10-CM | POA: Diagnosis not present

## 2020-06-06 DIAGNOSIS — N390 Urinary tract infection, site not specified: Secondary | ICD-10-CM

## 2020-06-06 DIAGNOSIS — Z8679 Personal history of other diseases of the circulatory system: Secondary | ICD-10-CM

## 2020-06-06 DIAGNOSIS — Z0000 Encounter for general adult medical examination without abnormal findings: Secondary | ICD-10-CM | POA: Diagnosis not present

## 2020-06-06 NOTE — Assessment & Plan Note (Signed)
Reviewed health habits including diet and exercise and skin cancer prevention Reviewed appropriate screening tests for age  Also reviewed health mt list, fam hx and immunization status , as well as social and family history   See HPI Labs ordered  Mammogram utd, sees gyn for breast exam  Colonoscopy utd 8/21 with a 3 y recall for polyps Tetanus def for insurance covid immunized with booster  dexa nl 2017-will consider another at 5 y mark in the fall,  No falls or fx inst to start vit D 1000 iu daily for bone health  Noted good exercise  Failed hearing screen, will plan to treat cerumen impaction  Vision/eye care is utd

## 2020-06-06 NOTE — Assessment & Plan Note (Signed)
Worse in L ear today  Failed hearing test inst to use debrox as directed twice weekly  F/u for irritation after at least a mo if needed

## 2020-06-06 NOTE — Assessment & Plan Note (Signed)
No occurances

## 2020-06-06 NOTE — Assessment & Plan Note (Signed)
Seeing urology

## 2020-06-06 NOTE — Progress Notes (Signed)
Subjective:    Patient ID: Shannon [name], female    DOB: 1950/04/14, 70 y.o.   MRN: [id number]  This visit occurred during the SARS-CoV-2 public health emergency.  Safety protocols were in place, including screening questions prior to the visit, additional usage of staff PPE, and extensive cleaning of exam room while observing appropriate contact time as indicated for disinfecting solutions.    HPI Pt presents for amw and health mt exam with rev of chronic medical problems   I have personally reviewed the Medicare Annual Wellness questionnaire and have noted 1. The patient's medical and social history 2. Their use of alcohol, tobacco or illicit drugs 3. Their current medications and supplements 4. The patient's functional ability including ADL's, fall risks, home safety risks and hearing or visual             impairment. 5. Diet and physical activities 6. Evidence for depression or mood disorders  The patients weight, height, BMI have been recorded in the chart and visual acuity is per eye clinic.  I have made referrals, counseling and provided education to the patient based review of the above and I have provided the pt with a written personalized care plan for preventive services. Reviewed and updated provider list, see scanned forms.  See scanned forms.  Routine anticipatory guidance given to patient.  See health maintenance. Colon cancer screening  Colonoscopy 8/21 with 3 y recall  Breast cancer screening 4/22 mammogram Self breast exam=no lumps  Has gyn appt this summer  Flu vaccine - had in 9/21 Tetanus vaccine 1/12 Td Pneumovax-completed Zoster vaccine-had shingrix  covid immunized with booster  Dexa 11/17 -normal  Falls-none Fractures-none Supplements-none, can take D  Exercise - walking regularly   Advance directive- has living will and poa updated  Cognitive function addressed- see scanned forms- and if abnormal then additional documentation follows.  No  problems  No trouble with calculations and likes to read  No confusion  Has not become lost   PMH and SH reviewed  Meds, vitals, and allergies reviewed.   ROS: See HPI.  Otherwise negative.    Weight : Wt Readings from Last 3 Encounters:  06/06/20 169 lb 1.6 oz (76.7 kg)  03/31/20 166 lb (75.3 kg)  11/09/19 166 lb 11.2 oz (75.6 kg)   25.52 kg/m  Doing ok overall  Taking care of herself    Hearing/vision:  Hearing Screening   125Hz  250Hz  500Hz  1000Hz  2000Hz  3000Hz  4000Hz  6000Hz  8000Hz   Right ear:   Fail Fail Pass  Pass    Left ear:   Fail Fail Pass  Fail    Vision Screening Comments: [facility]  Last fall Per patient    On exam today-pt has cerumen impaction  No vision trouble  Wears glasses    She has not noticed hearing trouble   Care team Shivam Mestas-pcp [facility]/Prat-gyn  [facility]- urology [facility]-podiatry [facility]-GI  Due for wellness labs   H/o elevated blood sugar  Lab Results  Component Value Date   HGBA1C 6.1 04/26/2019  no change in diet    Seeing urology  Takes mybetriq  Patient Active Problem List   Diagnosis Date Noted  . Polyp of colon   . Cerumen impaction 05/05/2019  . History of PSVT (paroxysmal supraventricular tachycardia) 06/17/2017  . Recurrent UTI 08/23/2016  . Estrogen deficiency 12/06/2015  . Elevated blood sugar 12/06/2015  . Incontinence of urine in female 04/12/2015  . Medicare annual wellness visit, subsequent 11/30/2014  . Routine general  medical examination at a health care facility 11/05/2012  . PLANTAR FASCIITIS, LEFT 07/28/2009  . PERSONAL HX COLONIC POLYPS 11/02/2007  . GERD 10/20/2006   Past Medical History:  Diagnosis Date  . Arthritis    left foot  . Blood transfusion without reported diagnosis   . Chronic neck and back pain    received physical therapy  . Dysrhythmia    Pt told she had "benign arrythmia" after wearing monitor  . GERD (gastroesophageal reflux disease)    with esophagitis  . Motion  sickness    boats, planes  . Numbness    Right outer thigh, constant, notices more with standing for a long period of time  . Plantar fasciitis    left   Past Surgical History:  Procedure Laterality Date  . ARTHRODESIS METATARSAL Left 12/30/2018   Procedure: ARTHRODESIS,LISFRANC;MULTIPLE LEFT;  Surgeon: Samara Deist, DPM;  Location: [facility];  Service: Podiatry;  Laterality: Left;  general with local  . BLADDER SUSPENSION N/A 07/04/2015   Procedure: TRANSVAGINAL TAPE (TVT) SLING                   ;  Surgeon: Emily Filbert, MD;  Location: [facility];  Service: Gynecology;  Laterality: N/A;  . COLONOSCOPY    . COLONOSCOPY WITH PROPOFOL N/A 09/08/2019   Procedure: COLONOSCOPY WITH PROPOFOL;  Surgeon: Virgel Manifold, MD;  Location: [facility];  Service: Endoscopy;  Laterality: N/A;  . CYSTOCELE REPAIR N/A 07/04/2015   Procedure: ANTERIOR REPAIR (CYSTOCELE);  Surgeon: Emily Filbert, MD;  Location: [facility];  Service: Gynecology;  Laterality: N/A;  . CYSTOSCOPY N/A 07/04/2015   Procedure: CYSTOSCOPY;  Surgeon: Emily Filbert, MD;  Location: [facility];  Service: Gynecology;  Laterality: N/A;  . DILATION AND CURETTAGE OF UTERUS    . EXCISION MORTON'S NEUROMA Left 08/12/2018   Procedure: EXCISION MORTON'S NEUROMA;  Surgeon: Samara Deist, DPM;  Location: [facility];  Service: Podiatry;  Laterality: Left;  lma local  . POLYPECTOMY    . TUBAL LIGATION  1987  . UPPER GI ENDOSCOPY    . VAGINAL HYSTERECTOMY N/A 07/04/2015   Procedure: TOTAL VAGINAL HYSTERECTOMY  ;  Surgeon: Emily Filbert, MD;  Location: [facility];  Service: Gynecology;  Laterality: N/A;   Social History   Tobacco Use  . Smoking status: Never Smoker  . Smokeless tobacco: Never Used  Vaping Use  . Vaping Use: Never used  Substance Use Topics  . Alcohol use: No    Alcohol/week: 0.0 standard drinks    Comment: rare  . Drug use: No   Family History  Problem Relation Age of Onset  . Stroke Mother        x 2  . Heart  disease Mother        congenital arrhythmia and CHF  . Depression Mother   . Atrial fibrillation Mother   . Prostate cancer Brother   . Atrial fibrillation Brother   . Stomach cancer Paternal Uncle   . Colon cancer Neg Hx   . Esophageal cancer Neg Hx   . Kidney cancer Neg Hx   . Bladder Cancer Neg Hx    Allergies  Allergen Reactions  . Adhesive [Tape] Rash    (not sure if rash was from ointment or tape)  . Alpha-Gal Rash    (red meat S/P tick bite)  . Neomycin-Bacitracin Zn-Polymyx Rash    Ointment   Current Outpatient Medications on File Prior to Visit  Medication Sig  Dispense Refill  . conjugated estrogens (PREMARIN) vaginal cream Place vaginally 3 (three) times a week. 42.5 g 6  . famotidine (PEPCID) 20 MG tablet Take 20 mg by mouth daily as needed for heartburn or indigestion.    . Lactobacillus ([place] PRO-B PO) Take by mouth daily.    Marland Kitchen MAGNESIUM GLUCONATE PO Take by mouth daily.    . Melatonin 3 MG TABS Take by mouth at bedtime as needed.    . Misc Natural Products (PUMPKIN SEED OIL PO) Take 1,000 mg by mouth in the morning and at bedtime.    . Multiple Vitamin (MULTIVITAMIN) tablet Take 1 tablet by mouth daily.    Marland Kitchen MYRBETRIQ 50 MG TB24 tablet TAKE ONE TABLET BY MOUTH DAILY 90 tablet 3  . St Johns Wort 300 MG TABS Take by mouth.     No current facility-administered medications on file prior to visit.    Review of Systems  Constitutional: Negative for activity change, appetite change, fatigue, fever and unexpected weight change.  HENT: Negative for congestion, ear pain, rhinorrhea, sinus pressure and sore throat.   Eyes: Negative for pain, redness and visual disturbance.  Respiratory: Negative for cough, shortness of breath and wheezing.   Cardiovascular: Negative for chest pain and palpitations.  Gastrointestinal: Negative for abdominal pain, blood in stool, constipation and diarrhea.  Endocrine: Negative for polydipsia and polyuria.  Genitourinary: Negative for  dysuria, frequency and urgency.  Musculoskeletal: Negative for arthralgias, back pain and myalgias.       Chronic foot pain  Bunion on L foot may later req surgery   Skin: Negative for pallor and rash.  Allergic/Immunologic: Negative for environmental allergies.  Neurological: Negative for dizziness, syncope and headaches.  Hematological: Negative for adenopathy. Does not bruise/bleed easily.  Psychiatric/Behavioral: Negative for decreased concentration and dysphoric mood. The patient is not nervous/anxious.        Objective:   Physical Exam Constitutional:      General: She is not in acute distress.    Appearance: Normal appearance. She is well-developed. She is not ill-appearing or diaphoretic.  HENT:     Head: Normocephalic and atraumatic.     Right Ear: Tympanic membrane, ear canal and external ear normal. There is impacted cerumen.     Left Ear: Tympanic membrane, ear canal and external ear normal. There is impacted cerumen.     Ears:     Comments: Worse cerumen imp on L  R -can see TM, nl appearing     Nose: Nose normal. No congestion.     Mouth/Throat:     Mouth: Mucous membranes are moist.     Pharynx: Oropharynx is clear. No posterior oropharyngeal erythema.  Eyes:     General: No scleral icterus.    Extraocular Movements: Extraocular movements intact.     Conjunctiva/sclera: Conjunctivae normal.     Pupils: Pupils are equal, round, and reactive to light.  Neck:     Thyroid: No thyromegaly.     Vascular: No carotid bruit or JVD.  Cardiovascular:     Rate and Rhythm: Normal rate and regular rhythm.     Pulses: Normal pulses.     Heart sounds: Normal heart sounds. No gallop.   Pulmonary:     Effort: Pulmonary effort is normal. No respiratory distress.     Breath sounds: Normal breath sounds. No wheezing.     Comments: Good air exch Chest:     Chest wall: No tenderness.  Abdominal:     General: Bowel sounds  are normal. There is no distension or abdominal bruit.      Palpations: Abdomen is soft. There is no mass.     Tenderness: There is no abdominal tenderness.     Hernia: No hernia is present.  Genitourinary:    Comments: Breast and pelvic exam done by gyn Musculoskeletal:        General: No tenderness. Normal range of motion.     Cervical back: Normal range of motion and neck supple. No rigidity. No muscular tenderness.     Right lower leg: No edema.     Left lower leg: No edema.     Comments: No kyphosis   Lymphadenopathy:     Cervical: No cervical adenopathy.  Skin:    General: Skin is warm and dry.     Coloration: Skin is not pale.     Findings: No erythema or rash.     Comments: Solar lentigines diffusely   Neurological:     Mental Status: She is alert. Mental status is at baseline.     Cranial Nerves: No cranial nerve deficit.     Motor: No abnormal muscle tone.     Coordination: Coordination normal.     Gait: Gait normal.     Deep Tendon Reflexes: Reflexes are normal and symmetric. Reflexes normal.  Psychiatric:        Mood and Affect: Mood normal.        Cognition and Memory: Cognition and memory normal.           Assessment & Plan:   Problem List Items Addressed This Visit      Nervous and Auditory   Cerumen impaction    Worse in L ear today  Failed hearing test inst to use debrox as directed twice weekly  F/u for irritation after at least a mo if needed         Genitourinary   Recurrent UTI    Seeing urology        Other   Routine general medical examination at a health care facility    Reviewed health habits including diet and exercise and skin cancer prevention Reviewed appropriate screening tests for age  Also reviewed health mt list, fam hx and immunization status , as well as social and family history   See HPI Labs ordered  Mammogram utd, sees gyn for breast exam  Colonoscopy utd 8/21 with a 3 y recall for polyps Tetanus def for insurance covid immunized with booster  dexa nl 2017-will consider  another at 5 y mark in the fall,  No falls or fx inst to start vit D 1000 iu daily for bone health  Noted good exercise  Failed hearing screen, will plan to treat cerumen impaction  Vision/eye care is utd       Relevant Orders   CBC with Differential/Platelet   Comprehensive metabolic panel   Lipid panel   TSH   Medicare annual wellness visit, subsequent - Primary    Reviewed health habits including diet and exercise and skin cancer prevention Reviewed appropriate screening tests for age  Also reviewed health mt list, fam hx and immunization status , as well as social and family history   See HPI Labs ordered  Mammogram utd, sees gyn for breast exam  Colonoscopy utd 8/21 with a 3 y recall for polyps Tetanus def for insurance covid immunized with booster  dexa nl 2017-will consider another at 5 y mark in the fall,  No falls or fx inst to  start vit D 1000 iu daily for bone health  Noted good exercise  Failed hearing screen, will plan to treat cerumen impaction  Vision/eye care is utd        Elevated blood sugar    A1C ordered with labs today  disc imp of low glycemic diet and wt loss to prevent DM2  Pt has a good diet overall       Relevant Orders   Hemoglobin A1c   History of PSVT (paroxysmal supraventricular tachycardia)    No occurances

## 2020-06-06 NOTE — Assessment & Plan Note (Signed)
A1C ordered with labs today  disc imp of low glycemic diet and wt loss to prevent DM2  Pt has a good diet overall

## 2020-06-06 NOTE — Assessment & Plan Note (Signed)
Reviewed health habits including diet and exercise and skin cancer prevention Reviewed appropriate screening tests for age  Also reviewed health mt list, fam hx and immunization status , as well as social and family history   See HPI Labs ordered  Mammogram utd, sees gyn for breast exam  Colonoscopy utd 8/21 with a 3 y recall for polyps Tetanus def for insurance covid immunized with booster  dexa nl 2017-will consider another at 5 y mark in the fall,  No falls or fx inst to start vit D 1000 iu daily for bone health  Noted good exercise  Failed hearing screen, will plan to treat cerumen impaction  Vision/eye care is utd   

## 2020-06-06 NOTE — Patient Instructions (Addendum)
Let us know next fall if you want a bone density test   Get some vitamin D3 over the counter and take 1000 iu daily   Get debrox ear solution (or similar)  Use it twice a week as directed  Left ear is worse   We can irrigate ears later if needed   Labs today  Stay active  Take good care of yourself

## 2020-06-07 LAB — CBC WITH DIFFERENTIAL/PLATELET
Basophils Absolute: 0.1 10*3/uL (ref 0.0–0.1)
Basophils Relative: 1.2 % (ref 0.0–3.0)
Eosinophils Absolute: 0.1 10*3/uL (ref 0.0–0.7)
Eosinophils Relative: 1.9 % (ref 0.0–5.0)
HCT: 39.8 % (ref 36.0–46.0)
Hemoglobin: 13.3 g/dL (ref 12.0–15.0)
Lymphocytes Relative: 27.4 % (ref 12.0–46.0)
Lymphs Abs: 1.5 10*3/uL (ref 0.7–4.0)
MCHC: 33.5 g/dL (ref 30.0–36.0)
MCV: 90.5 fl (ref 78.0–100.0)
Monocytes Absolute: 0.5 10*3/uL (ref 0.1–1.0)
Monocytes Relative: 9.9 % (ref 3.0–12.0)
Neutro Abs: 3.2 10*3/uL (ref 1.4–7.7)
Neutrophils Relative %: 59.6 % (ref 43.0–77.0)
Platelets: 248 10*3/uL (ref 150.0–400.0)
RBC: 4.4 Mil/uL (ref 3.87–5.11)
RDW: 13.4 % (ref 11.5–15.5)
WBC: 5.4 10*3/uL (ref 4.0–10.5)

## 2020-06-07 LAB — COMPREHENSIVE METABOLIC PANEL
ALT: 14 U/L (ref 0–35)
AST: 16 U/L (ref 0–37)
Albumin: 4 g/dL (ref 3.5–5.2)
Alkaline Phosphatase: 71 U/L (ref 39–117)
BUN: 35 mg/dL — ABNORMAL HIGH (ref 6–23)
CO2: 32 mEq/L (ref 19–32)
Calcium: 9 mg/dL (ref 8.4–10.5)
Chloride: 102 mEq/L (ref 96–112)
Creatinine, Ser: 0.73 mg/dL (ref 0.40–1.20)
GFR: 82.47 mL/min (ref >60.00)
Glucose, Bld: 127 mg/dL — ABNORMAL HIGH (ref 70–99)
Potassium: 4.4 mEq/L (ref 3.5–5.1)
Sodium: 141 mEq/L (ref 135–145)
Total Bilirubin: 0.6 mg/dL (ref 0.2–1.2)
Total Protein: 6.4 g/dL (ref 6.0–8.3)

## 2020-06-07 LAB — LIPID PANEL
Cholesterol: 176 mg/dL (ref 0–200)
HDL: 73.4 mg/dL (ref >39.00)
LDL Cholesterol: 76 mg/dL (ref 0–99)
NonHDL: 102.24
Total CHOL/HDL Ratio: 2
Triglycerides: 132 mg/dL (ref 0.0–149.0)
VLDL: 26.4 mg/dL (ref 0.0–40.0)

## 2020-06-07 LAB — TSH: TSH: 0.52 u[IU]/mL (ref 0.35–4.50)

## 2020-06-07 LAB — HEMOGLOBIN A1C: Hgb A1c MFr Bld: 6.3 % (ref 4.6–6.5)

## 2020-07-25 ENCOUNTER — Ambulatory Visit (INDEPENDENT_AMBULATORY_CARE_PROVIDER_SITE_OTHER): Payer: Medicare PPO | Admitting: Family Medicine

## 2020-07-25 ENCOUNTER — Encounter: Payer: Self-pay | Admitting: Family Medicine

## 2020-07-25 ENCOUNTER — Other Ambulatory Visit: Payer: Self-pay

## 2020-07-25 VITALS — BP 116/68 | HR 71 | Ht 68.5 in | Wt 168.2 lb

## 2020-07-25 DIAGNOSIS — Z01419 Encounter for gynecological examination (general) (routine) without abnormal findings: Secondary | ICD-10-CM

## 2020-07-25 DIAGNOSIS — R32 Unspecified urinary incontinence: Secondary | ICD-10-CM

## 2020-07-25 DIAGNOSIS — E2839 Other primary ovarian failure: Secondary | ICD-10-CM

## 2020-07-25 MED ORDER — ESTROGENS, CONJUGATED 0.625 MG/GM VA CREA: TOPICAL_CREAM | VAGINAL | 6 refills | Status: DC

## 2020-07-25 MED ORDER — MIRABEGRON ER 50 MG PO TB24: 50.0000 mg | ORAL_TABLET | Freq: Every day | ORAL | 3 refills | Status: DC

## 2020-07-25 NOTE — Progress Notes (Signed)
Subjective:     Shannon [name] is a 70 y.o. female and is here for a comprehensive physical exam. The patient reports no problems. S/p TVH and TVT has pessary she removes nightly. Using Premarin 2x/wk. Also on Myrbetriq and Pumpkin Seed oil for her urge incontinence.  The following portions of the patient's history were reviewed and updated as appropriate: allergies, current medications, past family history, past medical history, past social history, past surgical history, and problem list.  Review of Systems Pertinent items noted in HPI and remainder of comprehensive ROS otherwise negative.   Objective:    BP 116/68   Pulse 71   Ht 5' 8.5" (1.74 m)   Wt 168 lb 3.2 oz (76.3 kg)   BMI 25.20 kg/m  General appearance: alert, cooperative, and appears stated age Neck: no adenopathy, supple, symmetrical, trachea midline, and thyroid not enlarged, symmetric, no tenderness/mass/nodules Lungs: clear to auscultation bilaterally Breasts: normal appearance, no masses or tenderness Heart: regular rate and rhythm, S1, S2 normal, no murmur, click, rub or gallop Abdomen: soft, non-tender; bowel sounds normal; no masses,  no organomegaly Extremities: extremities normal, atraumatic, no cyanosis or edema Pulses: 2+ and symmetric Skin: Skin color, texture, turgor normal. No rashes or lesions Lymph nodes: Cervical, supraclavicular, and axillary nodes normal.    Assessment:    Healthy female exam.      Plan:   Problem List Items Addressed This Visit       Unprioritized   Incontinence of urine in female   Relevant Medications   mirabegron ER (MYRBETRIQ) 50 MG TB24 tablet   Estrogen deficiency    Continue premarin 2x/wk       Relevant Medications   conjugated estrogens (PREMARIN) vaginal cream (Start on 07/27/2020)   Other Visit Diagnoses     Encounter for gynecological examination without abnormal finding    -  Primary      Return in 1 year (on 07/25/2021).    See After Visit  Summary for Counseling Recommendations

## 2020-07-25 NOTE — Progress Notes (Signed)
Mammo 4/22 Saw primary care in May for annual visit No concerns

## 2020-07-25 NOTE — Assessment & Plan Note (Signed)
Continue premarin 2x/wk

## 2020-08-14 DIAGNOSIS — L821 Other seborrheic keratosis: Secondary | ICD-10-CM | POA: Diagnosis not present

## 2020-08-14 DIAGNOSIS — D225 Melanocytic nevi of trunk: Secondary | ICD-10-CM | POA: Diagnosis not present

## 2020-08-14 DIAGNOSIS — D2271 Melanocytic nevi of right lower limb, including hip: Secondary | ICD-10-CM | POA: Diagnosis not present

## 2020-08-14 DIAGNOSIS — D2272 Melanocytic nevi of left lower limb, including hip: Secondary | ICD-10-CM | POA: Diagnosis not present

## 2020-08-14 DIAGNOSIS — D2262 Melanocytic nevi of left upper limb, including shoulder: Secondary | ICD-10-CM | POA: Diagnosis not present

## 2020-08-14 DIAGNOSIS — S90122A Contusion of left lesser toe(s) without damage to nail, initial encounter: Secondary | ICD-10-CM | POA: Diagnosis not present

## 2020-08-14 DIAGNOSIS — D2261 Melanocytic nevi of right upper limb, including shoulder: Secondary | ICD-10-CM | POA: Diagnosis not present

## 2020-10-18 DIAGNOSIS — M25562 Pain in left knee: Secondary | ICD-10-CM | POA: Diagnosis not present

## 2020-10-18 DIAGNOSIS — M25561 Pain in right knee: Secondary | ICD-10-CM | POA: Diagnosis not present

## 2020-11-04 IMAGING — MG DIGITAL SCREENING BILATERAL MAMMOGRAM WITH TOMO AND CAD
5 series · 6 of 13 positions shown · non-contrast
Comparison: Previous exam(s).

CLINICAL DATA: Screening.

EXAM:
DIGITAL SCREENING BILATERAL MAMMOGRAM WITH TOMO AND CAD

[L CC synth-2D]
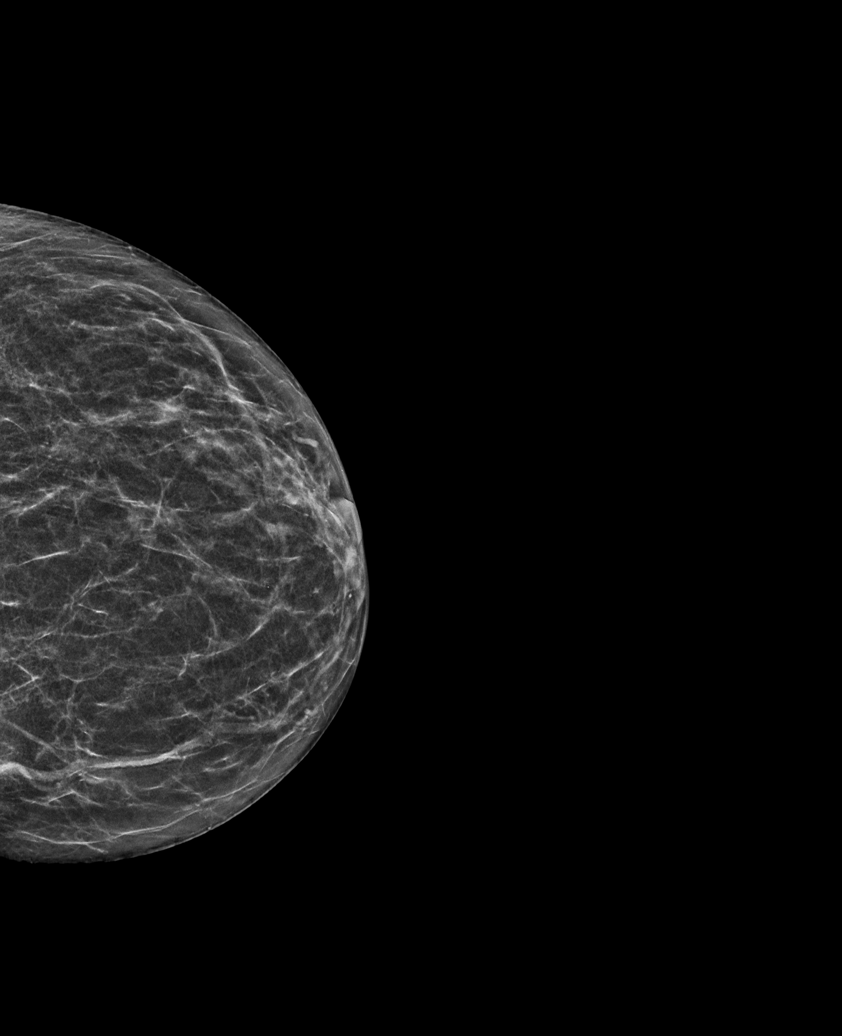

[R MLO synth-2D]
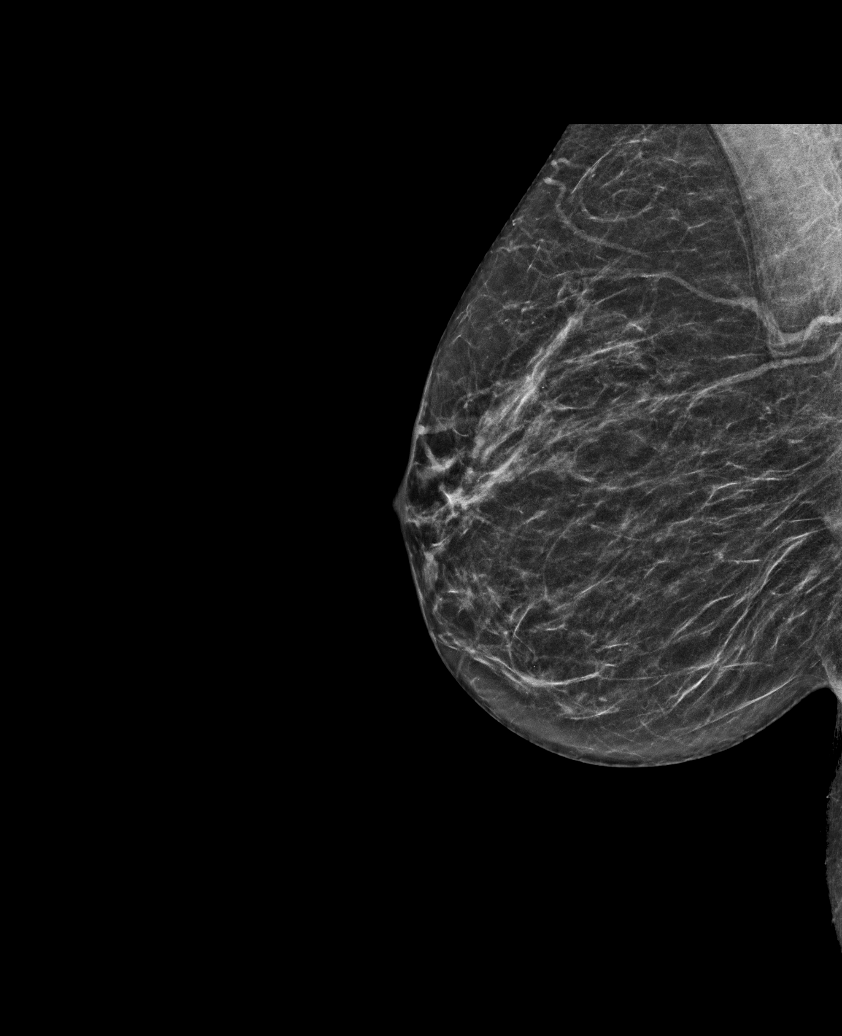

[L MLO synth-2D]
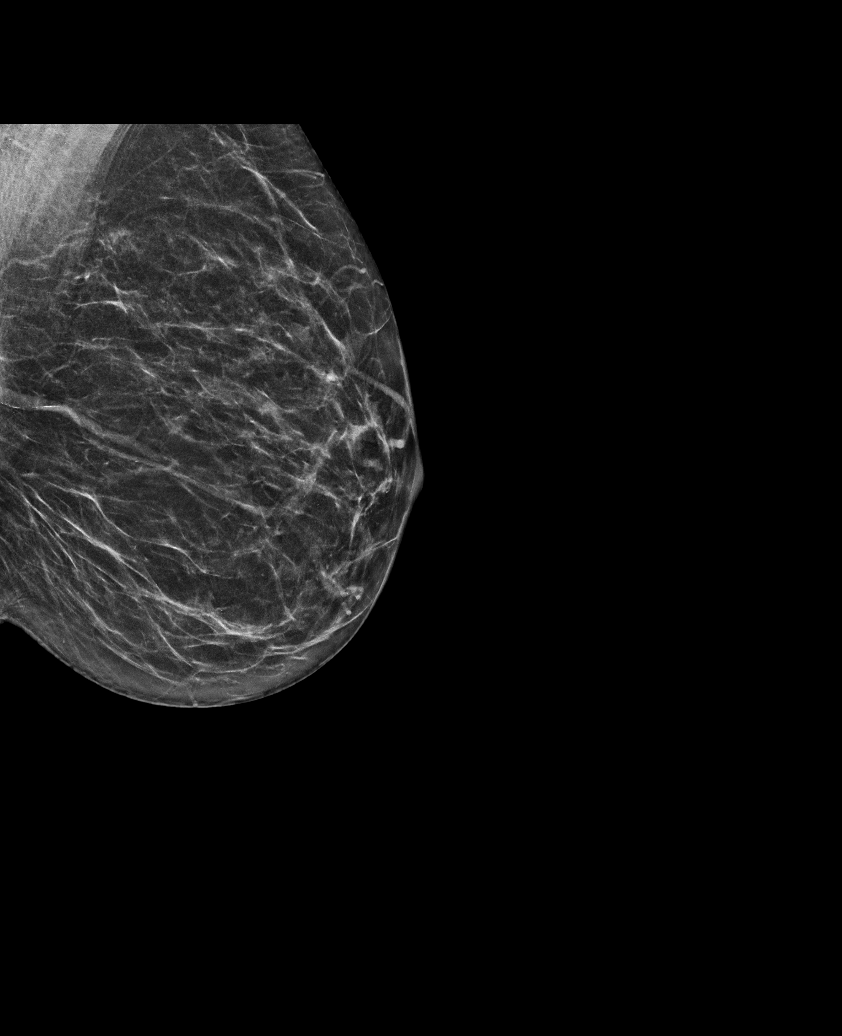

[L MLO tomo · 2 of 56 frames shown]
[frame 19/56]
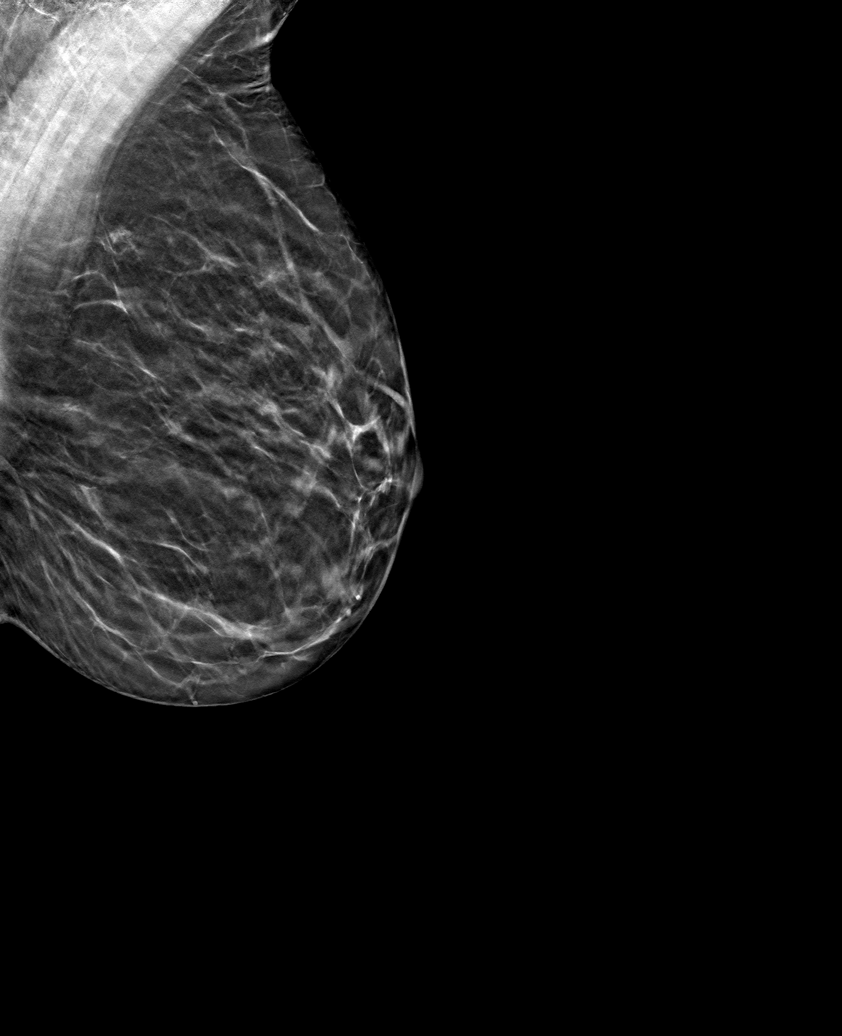
[frame 29/56]
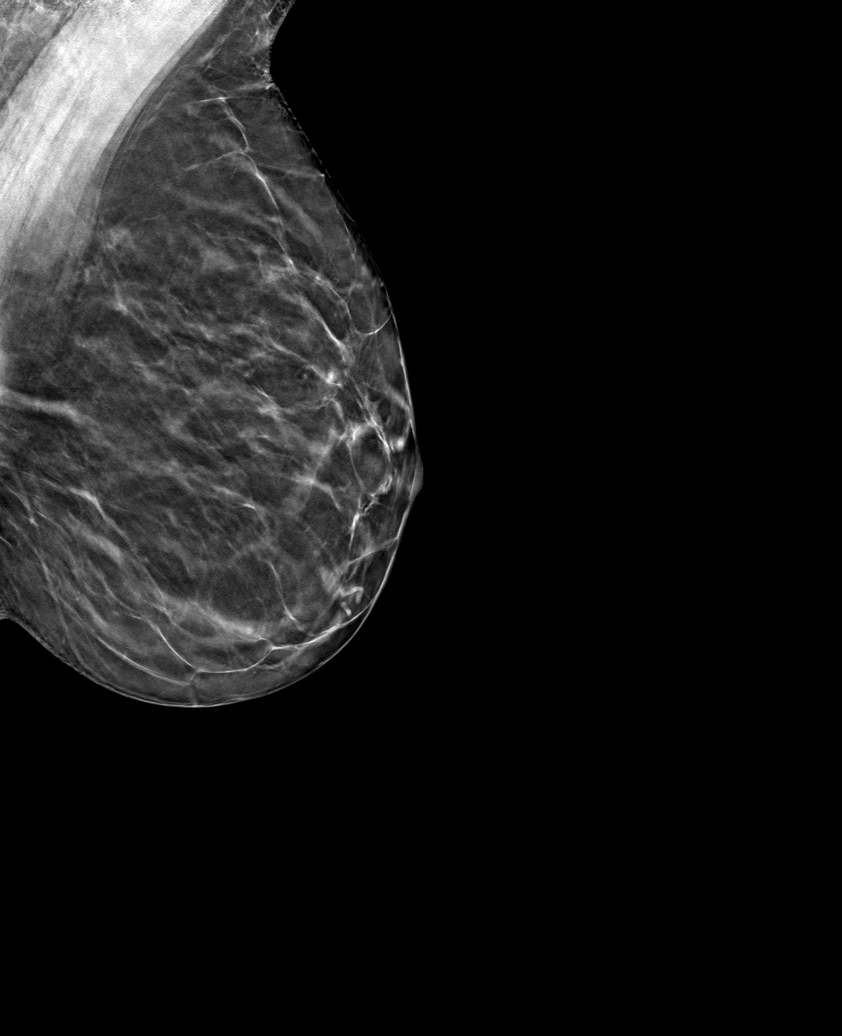

[R MLO tomo · tomo slice 29/56.0]
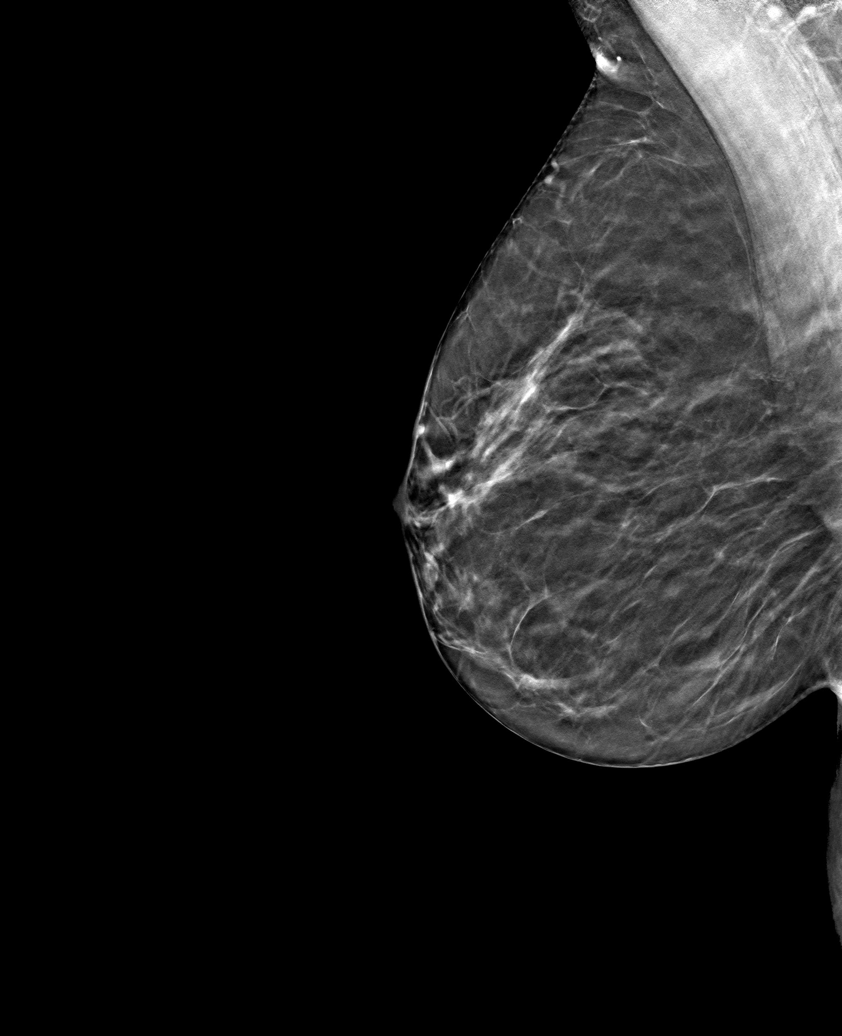

[6 of 13 positions shown; findings below may reference images not displayed]

ACR Breast Density Category b: There are scattered areas of
fibroglandular density.
FINDINGS: There are no findings suspicious for malignancy. Images were
processed with CAD.
IMPRESSION: No mammographic evidence of malignancy. A result letter of this
screening mammogram will be mailed directly to the patient.

RECOMMENDATION:
Screening mammogram in one year. (Code:[id number])

BI-RADS CATEGORY  1: Negative.

## 2020-11-07 NOTE — Progress Notes (Signed)
11/08/2020 10:08 AM   Shannon [name] December 03, 1950 [id number]  Referring provider: Abner Greenspan, MD [address] [place],  [place] [address]  Chief Complaint  Patient presents with   Other    Urological history: 1. rUTI's -contributing factors of age, incontinence, constipation and vaginal atrophy -Positive documented urine cultures over the last year  E.coli 03/31/2020 -managed with increased fluid intake, good perineal hygiene, avoiding tub baths and vaginal moisturizer   2. OAB/Urgency -managed with Myrbetriq 50 mg daily and pumpkin seed oil  -cysto 10/2018 NED   3. Incontinence -TVT Sling by gynecologist in 06/2015 during hysterectomy with anterior repair  -managed with pessary     HPI: Shannon [name] is a 70 y.o. female who presents today for yearly follow up.  She is experiencing 8 or more daytime urinations, 1-2 episodes of nocturia very strong urge to urinate.  She is suffering with urge incontinence having 3 or more weekly incontinent episodes weekly.  She is wearing 1 absorbent pad daily.  And she does at times monitor fluid intake and engage in toilet mapping to manage her urinary issues.  She continues to take the Myrbetriq 50 mg daily and also has added pumpkin seed oil to her regimen.  She states that she has noticed an improvement in her urinary symptoms with the addition of the pumpkin seed oil, but she was not able to discontinue the Myrbetriq 50 mg daily as her symptoms worsen when she attempted.  Patient denies any modifying or aggravating factors.  Patient denies any gross hematuria, dysuria or suprapubic/flank pain.  Patient denies any fevers, chills, nausea or vomiting.     PMH: Past Medical History:  Diagnosis Date   Arthritis    left foot   Blood transfusion without reported diagnosis    Chronic neck and back pain    received physical therapy   Dysrhythmia    Pt told she had "benign arrythmia" after wearing monitor   GERD  (gastroesophageal reflux disease)    with esophagitis   Motion sickness    boats, planes   Numbness    Right outer thigh, constant, notices more with standing for a long period of time   Plantar fasciitis    left    Surgical History: Past Surgical History:  Procedure Laterality Date   ARTHRODESIS METATARSAL Left 12/30/2018   Procedure: ARTHRODESIS,LISFRANC;MULTIPLE LEFT;  Surgeon: Samara Deist, DPM;  Location: [facility];  Service: Podiatry;  Laterality: Left;  general with local   BLADDER SUSPENSION N/A 07/04/2015   Procedure: TRANSVAGINAL TAPE (TVT) SLING                   ;  Surgeon: Emily Filbert, MD;  Location: [facility];  Service: Gynecology;  Laterality: N/A;   COLONOSCOPY     COLONOSCOPY WITH PROPOFOL N/A 09/08/2019   Procedure: COLONOSCOPY WITH PROPOFOL;  Surgeon: Virgel Manifold, MD;  Location: [facility];  Service: Endoscopy;  Laterality: N/A;   CYSTOCELE REPAIR N/A 07/04/2015   Procedure: ANTERIOR REPAIR (CYSTOCELE);  Surgeon: Emily Filbert, MD;  Location: [facility];  Service: Gynecology;  Laterality: N/A;   CYSTOSCOPY N/A 07/04/2015   Procedure: CYSTOSCOPY;  Surgeon: Emily Filbert, MD;  Location: [facility];  Service: Gynecology;  Laterality: N/A;   DILATION AND CURETTAGE OF UTERUS     EXCISION MORTON'S NEUROMA Left 08/12/2018   Procedure: EXCISION MORTON'S NEUROMA;  Surgeon: Samara Deist, DPM;  Location: [facility];  Service: Podiatry;  Laterality: Left;  lma local   POLYPECTOMY     TUBAL LIGATION  1987   UPPER GI ENDOSCOPY     VAGINAL HYSTERECTOMY N/A 07/04/2015   Procedure: TOTAL VAGINAL HYSTERECTOMY  ;  Surgeon: Emily Filbert, MD;  Location: [facility];  Service: Gynecology;  Laterality: N/A;    Home Medications:  Allergies as of 11/08/2020       Reactions   Adhesive [tape] Rash   (not sure if rash was from ointment or tape)   Alpha-gal Rash   (red meat S/P tick bite)   Neomycin-bacitracin Zn-polymyx Rash   Ointment        Medication List         Accurate as of November 08, 2020 10:08 AM. If you have any questions, ask your nurse or doctor.          conjugated estrogens 0.625 MG/GM vaginal cream Commonly known as: PREMARIN Place vaginally 2 (two) times a week.   famotidine 20 MG tablet Commonly known as: PEPCID Take 20 mg by mouth daily as needed for heartburn or indigestion.   MAGNESIUM GLUCONATE PO Take by mouth daily.   melatonin 3 MG Tabs tablet Take by mouth at bedtime as needed.   mirabegron ER 50 MG Tb24 tablet Commonly known as: Myrbetriq Take 1 tablet (50 mg total) by mouth daily.   multivitamin tablet Take 1 tablet by mouth daily.   PUMPKIN SEED OIL PO Take 1,000 mg by mouth in the morning and at bedtime.   Pumpkin Seed Oil Caps   REPHRESH PRO-B PO Take by mouth daily.   St Johns Wort 300 MG Tabs Take by mouth.        Allergies:  Allergies  Allergen Reactions   Adhesive [Tape] Rash    (not sure if rash was from ointment or tape)   Alpha-Gal Rash    (red meat S/P tick bite)   Neomycin-Bacitracin Zn-Polymyx Rash    Ointment    Family History: Family History  Problem Relation Age of Onset   Stroke Mother        x 2   Heart disease Mother        congenital arrhythmia and CHF   Depression Mother    Atrial fibrillation Mother    Prostate cancer Brother    Atrial fibrillation Brother    Stomach cancer Paternal Uncle    Colon cancer Neg Hx    Esophageal cancer Neg Hx    Kidney cancer Neg Hx    Bladder Cancer Neg Hx     Social History:  reports that she has never smoked. She has never used smokeless tobacco. She reports that she does not drink alcohol and does not use drugs.  ROS: Pertinent ROS in HPI  Physical Exam: BP 118/79   Pulse 69   Ht 5\' 8"  (1.727 m)   Wt 164 lb (74.4 kg)   BMI 24.94 kg/m   Constitutional:  Well nourished. Alert and oriented, No acute distress. HEENT: [place] AT, mask in place.  Trachea midline Cardiovascular: No clubbing, cyanosis, or  edema. Respiratory: Normal respiratory effort, no increased work of breathing. Neurologic: Grossly intact, no focal deficits, moving all 4 extremities. Psychiatric: Normal mood and affect.    Laboratory Data: Lab Results  Component Value Date   WBC 5.4 06/06/2020   HGB 13.3 06/06/2020   HCT 39.8 06/06/2020   MCV 90.5 06/06/2020   PLT 248.0 06/06/2020    Lab Results  Component Value Date  CREATININE 0.73 06/06/2020    Lab Results  Component Value Date   HGBA1C 6.3 06/06/2020    Lab Results  Component Value Date   TSH 0.52 06/06/2020       Component Value Date/Time   CHOL 176 06/06/2020 1501   HDL 73.40 06/06/2020 1501   CHOLHDL 2 06/06/2020 1501   VLDL 26.4 06/06/2020 1501   LDLCALC 76 06/06/2020 1501    Lab Results  Component Value Date   AST 16 06/06/2020   Lab Results  Component Value Date   ALT 14 06/06/2020  I have reviewed the labs.   Pertinent Imaging: N/A  Assessment & Plan:    1. Vaginal atrophy -using vaginal moisturizer instead     2. OAB/Urgency -continue Myrbetriq 50 mg daily -continue pessary -continue pumpkin seed oil   Return in about 1 year (around 11/08/2021) for PVR and OAB questionnaire.  These notes generated with voice recognition software. I apologize for typographical errors.  Zara Council, PA-C  [facility] [address]  [place], [place] [address] [phone]

## 2020-11-08 ENCOUNTER — Ambulatory Visit: Payer: Medicare PPO | Admitting: Urology

## 2020-11-08 ENCOUNTER — Encounter: Payer: Self-pay | Admitting: Urology

## 2020-11-08 ENCOUNTER — Other Ambulatory Visit: Payer: Self-pay

## 2020-11-08 VITALS — BP 118/79 | HR 69 | Ht 68.0 in | Wt 164.0 lb

## 2020-11-08 DIAGNOSIS — N952 Postmenopausal atrophic vaginitis: Secondary | ICD-10-CM

## 2020-11-08 DIAGNOSIS — R3915 Urgency of urination: Secondary | ICD-10-CM

## 2020-12-20 DIAGNOSIS — M2241 Chondromalacia patellae, right knee: Secondary | ICD-10-CM | POA: Diagnosis not present

## 2020-12-20 DIAGNOSIS — M3501 Sicca syndrome with keratoconjunctivitis: Secondary | ICD-10-CM | POA: Diagnosis not present

## 2020-12-25 ENCOUNTER — Other Ambulatory Visit: Payer: Self-pay

## 2020-12-25 ENCOUNTER — Encounter: Payer: Self-pay | Admitting: Physical Therapy

## 2020-12-25 ENCOUNTER — Ambulatory Visit: Payer: Medicare PPO | Attending: Orthopedic Surgery | Admitting: Physical Therapy

## 2020-12-25 VITALS — BP 107/65 | HR 72

## 2020-12-25 DIAGNOSIS — M25561 Pain in right knee: Secondary | ICD-10-CM | POA: Diagnosis not present

## 2020-12-25 DIAGNOSIS — G8929 Other chronic pain: Secondary | ICD-10-CM | POA: Diagnosis not present

## 2020-12-25 NOTE — Therapy (Signed)
[facility] [address] [place], [place], [address] Phone: [phone]   Fax:  [phone]  Physical Therapy Evaluation  Patient Details  Name: Shannon [name] MRN: [id number] Date of Birth: October 02, 1950 Referring Provider (PT): Thornton Park MD   Encounter Date: 12/25/2020   PT End of Session - 12/25/20 1542     Visit Number 1    Number of Visits 16    Date for PT Re-Evaluation 02/19/21    Authorization Type --    Progress Note Due on Visit 10    PT Start Time 0930    PT Stop Time 1015    PT Time Calculation (min) 45 min    Activity Tolerance Patient tolerated treatment well    Behavior During Therapy [facility] for tasks assessed/performed             Past Medical History:  Diagnosis Date   Arthritis    left foot   Blood transfusion without reported diagnosis    Chronic neck and back pain    received physical therapy   Dysrhythmia    Pt told she had "benign arrythmia" after wearing monitor   GERD (gastroesophageal reflux disease)    with esophagitis   Motion sickness    boats, planes   Numbness    Right outer thigh, constant, notices more with standing for a long period of time   Plantar fasciitis    left    Past Surgical History:  Procedure Laterality Date   ARTHRODESIS METATARSAL Left 12/30/2018   Procedure: ARTHRODESIS,LISFRANC;MULTIPLE LEFT;  Surgeon: Samara Deist, DPM;  Location: [facility];  Service: Podiatry;  Laterality: Left;  general with local   BLADDER SUSPENSION N/A 07/04/2015   Procedure: TRANSVAGINAL TAPE (TVT) SLING                   ;  Surgeon: Emily Filbert, MD;  Location: [facility];  Service: Gynecology;  Laterality: N/A;   COLONOSCOPY     COLONOSCOPY WITH PROPOFOL N/A 09/08/2019   Procedure: COLONOSCOPY WITH PROPOFOL;  Surgeon: Virgel Manifold, MD;  Location: [facility];  Service: Endoscopy;  Laterality: N/A;   CYSTOCELE REPAIR N/A 07/04/2015   Procedure:  ANTERIOR REPAIR (CYSTOCELE);  Surgeon: Emily Filbert, MD;  Location: [facility];  Service: Gynecology;  Laterality: N/A;   CYSTOSCOPY N/A 07/04/2015   Procedure: CYSTOSCOPY;  Surgeon: Emily Filbert, MD;  Location: [facility];  Service: Gynecology;  Laterality: N/A;   DILATION AND CURETTAGE OF UTERUS     EXCISION MORTON'S NEUROMA Left 08/12/2018   Procedure: EXCISION MORTON'S NEUROMA;  Surgeon: Samara Deist, DPM;  Location: [facility];  Service: Podiatry;  Laterality: Left;  lma local   POLYPECTOMY     TUBAL LIGATION  1987   UPPER GI ENDOSCOPY     VAGINAL HYSTERECTOMY N/A 07/04/2015   Procedure: TOTAL VAGINAL HYSTERECTOMY  ;  Surgeon: Emily Filbert, MD;  Location: [facility];  Service: Gynecology;  Laterality: N/A;    Vitals:   12/25/20 0951  BP: 107/65  Pulse: 72  SpO2: 93%      Subjective Assessment - 12/25/20 0936     Subjective Pt reports that her right knee pain started in September after performing a lumbar rotaton stretch. She went to walk in clinic where she was prescribed prednisone. She describes that main issue is stiffness in the back of her right knee particularly in the calf muscle. Pain continued to happen so  she went to Dr. Mack Guise who did x-rays and identified chrondromalacia of patella in right knee. He reports that the right knee muscles are compensating by stiffening. He administered injection which seemed to help considerably.    Pertinent History chrondromalacia of patella    Limitations Other (comment)   Standing up from a low seat   How long can you sit comfortably? N/a    How long can you stand comfortably? Tighteness up when she is standing for a long time    How long can you walk comfortably? End of day feels more sore    Diagnostic tests Not listed in chart    Patient Stated Goals Feel less stiffness and pain in the right knee    Currently in Pain? No/denies    Pain Descriptors / Indicators Dull;Tightness;Sharp    Pain Type Chronic pain    Pain Onset More than a  month ago    Pain Frequency Intermittent    Aggravating Factors  Bending, Squatting, Stairs, Walking    Pain Relieving Factors Prednisone    Effect of Pain on Daily Activities None                [facility] PT Assessment - 12/25/20 0001       Assessment   Medical Diagnosis Right knee pain    Referring Provider (PT) Thornton Park MD    Onset Date/Surgical Date 09/21/20    Prior Therapy Yes      Home Environment   Entrance Stairs-Number of Steps 4    Entrance Stairs-Rails Can reach both    Home Layout Two level    Alternate Level Stairs-Number of Steps 12    Alternate Level Stairs-Rails Can reach both    Home Equipment None      Prior Function   Level of Independence Independent    Vocation Retired    Leisure Database administrator, writes poetry      Cognition   Overall Cognitive Status Within Functional Limits for tasks assessed    Attention Focused    Focused Attention Appears intact    Memory Appears intact            KNEE EVAL      Strength:           R         L  Knee Flexion:      5/5        5/5  Knee Extension:    NT         NT  Hip Flexion:       NT         NT  Hip Extension:     4/5       4/5 Hip ER:            NT         NT Hip IR:            NT         NT Hip Abduction:     4+/5       4+/5 Hip Adduction:     NT         NT   Ankle DF:          NT         NT  Ankle PF:          NT         NT     Hip  ROM  R                 L                   AROM     PROM     AROM    PROM         NORMS  Flexion       120*      130      120    120            120 Extension     20        20       20      20            20  Abduction     45        45       45      45            45 Adduction     30        30       30      30            30                         Knee ROM            R                 L              NORMS               AROM     PROM      AROM   PROM Flexion:     120*      130*       135   135           150  Extension:    0         0           0     0             0   Special Tests:     PFPS:  Q-Angle Measurement               NT Patellar Tilt Test                         NT  Patellar Apprehension Test          (-)  Flexibility tests: Thomas Test                         (-) Ober's Test                         (-) Hamstring 90/90                     (+ Restriction at 70 degrees ) Ely's Test                          (+) Gastroc/soleus                  (+)   Functional Assessment: Squat- Painful in the anterior portion of  left knee  Single leg stance NT  Single leg squat  NT   THEREX:  Prone quadriceps stretch 2 x 60 sec  Long Sitting Calf Stretch with Strap 2 x 60 sec      PT Education - 12/25/20 1541     Education provided Yes    Education Details form and technique during exercise for appropriate form    Person(s) Educated Patient    Methods Explanation;Demonstration;Verbal cues;Handout    Comprehension Verbalized understanding;Returned demonstration;Verbal cues required              PT Short Term Goals - 12/25/20 1540       PT SHORT TERM GOAL #1   Title Patient will demonstrate understanding of HEP to improve function to return to walking with decreased pain or close to pain free.    Baseline 12/5: NT    Time 2    Period Weeks    Status New    Target Date 01/08/21               PT Long Term Goals - 12/25/20 1612       PT LONG TERM GOAL #1   Title Patient will have improved function and activity level as evidenced by an increase in FOTO score by 10 points or more.    Baseline 12/5: 69/76    Time 8    Period Weeks    Status New    Target Date 02/19/21      PT LONG TERM GOAL #2   Title Patient will improve right knee flexion to improve pull on patella from quad tendon to improve motion of patella in order to decrease right knee pain.    Baseline 12/5: Right Knee Flex AROM 120 PROM 130    Time 8    Period Weeks    Status New    Target Date 02/19/21      PT LONG TERM GOAL  #4   Title Patient will increase hip strength by 1/2 MMT offload abnormal forces on knee to improve knee pain and to improve function with squatting and stair negotiation.    Baseline 12/5: Hip Ext R/L 4/4 Hip Abd 4+/4+    Time 8    Period Weeks    Status New    Target Date 02/19/21             HEP includes the following:  Access Code: [email or web address] URL: [email or web address] Date: 12/25/2020 Prepared by: Bradly Chris  Exercises Prone Quadriceps Stretch with Strap - 1 x daily - 7 x weekly - 1 sets - 3 reps - 60 hold Long Sitting Calf Stretch with Strap - 1 x daily - 7 x weekly - 1 sets - 3 reps - 60 hold     Plan - 12/25/20 1543     Clinical Impression Statement Pt is a 70 yo white female that presents for initial eval for right knee pain that was recently diagnosed as chondromalacia of patella. She has signs and symptoms that are suggestive of patellofemoral pain syndrome with increased rectus femoris tightness and knee pain and decreased hip strength and knee ROM that have resulted in abnormal tracking of the patella. She will benefit from skilled PT to increase right knee ROM and strength and to decrease right knee pain to return to recreational walking and weight bearing tasks without a significant increase in her pain to maintain her physical well-being.    Personal Factors and Comorbidities Age;Time since onset of injury/illness/exacerbation;Past/Current Experience  Examination-Activity Limitations Squat;Stairs;Stand;Transfers;Locomotion Level    Examination-Participation Restrictions Cleaning;Community Activity    Stability/Clinical Decision Making Stable/Uncomplicated    Clinical Decision Making Low    Clinical Presentation due to: Prior history and length of knee pain    Rehab Potential Good    PT Frequency 2x / week    PT Duration 8 weeks    PT Treatment/Interventions ADLs/Self Care Home Management;Aquatic Therapy;Cryotherapy;Electrical Stimulation;Moist  Heat;Gait training;Stair training;Functional mobility training;Therapeutic activities;Therapeutic exercise;Neuromuscular re-education;Balance training;Joint Manipulations;Dry needling;Passive range of motion;Manual techniques;Patient/family education    PT Next Visit Plan Finsh hip and knee strength testing. Begin hip strengthening and patellar mobilizations    PT Home Exercise Plan [email or web address]    Consulted and Agree with Plan of Care Patient             Patient will benefit from skilled therapeutic intervention in order to improve the following deficits and impairments:  Abnormal gait, Decreased strength, Decreased range of motion, Pain, Impaired flexibility  Visit Diagnosis: Chronic pain of right knee     Problem List Patient Active Problem List   Diagnosis Date Noted   Polyp of colon    Cerumen impaction 05/05/2019   History of PSVT (paroxysmal supraventricular tachycardia) 06/17/2017   Recurrent UTI 08/23/2016   Estrogen deficiency 12/06/2015   Elevated blood sugar 12/06/2015   Incontinence of urine in female 04/12/2015   Medicare annual wellness visit, subsequent 11/30/2014   Routine general medical examination at a health care facility 11/05/2012   PLANTAR FASCIITIS, LEFT 07/28/2009   PERSONAL HX COLONIC POLYPS 11/02/2007   GERD 10/20/2006   Bradly Chris PT, DPT  12/25/2020, 4:20 PM  [facility] [address], [place], [address] Phone: [phone]   Fax:  [phone]  Name: Shannon [name] MRN: [id number] Date of Birth: 10/22/1950

## 2020-12-27 ENCOUNTER — Ambulatory Visit: Payer: Medicare PPO | Admitting: Physical Therapy

## 2020-12-27 DIAGNOSIS — M25561 Pain in right knee: Secondary | ICD-10-CM | POA: Diagnosis not present

## 2020-12-27 DIAGNOSIS — G8929 Other chronic pain: Secondary | ICD-10-CM | POA: Diagnosis not present

## 2020-12-27 NOTE — Therapy (Signed)
[facility] [address], [place], [address] Phone: [phone]   Fax:  [phone]  Physical Therapy Treatment  Patient Details  Name: Shannon [name] MRN: [id number] Date of Birth: October 15, 1950 Referring Provider (PT): Thornton Park MD   Encounter Date: 12/27/2020    Past Medical History:  Diagnosis Date   Arthritis    left foot   Blood transfusion without reported diagnosis    Chronic neck and back pain    received physical therapy   Dysrhythmia    Pt told she had "benign arrythmia" after wearing monitor   GERD (gastroesophageal reflux disease)    with esophagitis   Motion sickness    boats, planes   Numbness    Right outer thigh, constant, notices more with standing for a long period of time   Plantar fasciitis    left    Past Surgical History:  Procedure Laterality Date   ARTHRODESIS METATARSAL Left 12/30/2018   Procedure: ARTHRODESIS,LISFRANC;MULTIPLE LEFT;  Surgeon: Samara Deist, DPM;  Location: [facility];  Service: Podiatry;  Laterality: Left;  general with local   BLADDER SUSPENSION N/A 07/04/2015   Procedure: TRANSVAGINAL TAPE (TVT) SLING                   ;  Surgeon: Emily Filbert, MD;  Location: [facility];  Service: Gynecology;  Laterality: N/A;   COLONOSCOPY     COLONOSCOPY WITH PROPOFOL N/A 09/08/2019   Procedure: COLONOSCOPY WITH PROPOFOL;  Surgeon: Virgel Manifold, MD;  Location: [facility];  Service: Endoscopy;  Laterality: N/A;   CYSTOCELE REPAIR N/A 07/04/2015   Procedure: ANTERIOR REPAIR (CYSTOCELE);  Surgeon: Emily Filbert, MD;  Location: [facility];  Service: Gynecology;  Laterality: N/A;   CYSTOSCOPY N/A 07/04/2015   Procedure: CYSTOSCOPY;  Surgeon: Emily Filbert, MD;  Location: [facility];  Service: Gynecology;  Laterality: N/A;   DILATION AND CURETTAGE OF UTERUS     EXCISION MORTON'S NEUROMA Left 08/12/2018   Procedure: EXCISION MORTON'S NEUROMA;  Surgeon: Samara Deist, DPM;  Location: [facility];  Service: Podiatry;  Laterality: Left;  lma local   POLYPECTOMY     TUBAL LIGATION  1987   UPPER GI ENDOSCOPY     VAGINAL HYSTERECTOMY N/A 07/04/2015   Procedure: TOTAL VAGINAL HYSTERECTOMY  ;  Surgeon: Emily Filbert, MD;  Location: [facility];  Service: Gynecology;  Laterality: N/A;    There were no vitals filed for this visit.   Subjective Assessment - 12/27/20 0937     Subjective Pt reports that exercises went well and she does them several times each day. Knee feels about the same as it usually does with tightness in the back fo the knee.    Pertinent History chrondromalacia of patella    Limitations Other (comment)   Standing up from a low seat   How long can you sit comfortably? N/a    How long can you stand comfortably? Tighteness up when she is standing for a long time    How long can you walk comfortably? End of day feels more sore    Diagnostic tests Not listed in chart    Patient Stated Goals Feel less stiffness and pain in the right knee    Currently in Pain? Yes    Pain Score 2     Pain Location Knee    Pain Orientation Right    Pain Descriptors / Indicators Dull;Tightness  Pain Type Chronic pain    Pain Onset More than a month ago    Pain Frequency Intermittent             THEREX:  Nu-Step L4 Seat 4 Handles 8 Duration 10 min   Strength:           R         L  Knee Extension:    5/5        5/5    Hip Flexion:       5/5        5/5  Hip ER:            5/5        5/5 Hip IR:            5/5        5/5 Hip Adduction:     4/5        4/5     Ankle DF:          5/5        5/5  Ankle PF:          5/5        5/5  Supine Bridges 3 x 10   Long Sitting HS Stretch 2 x 30 sec   Sidelying Hip Abduction 3 x 10  - min VC and TC to decrease hip rotation and to maintain leg position.    Updated HEP and educated patient on changes to exercises and addition of new exercises that include sidelying hip abduction, supine bridges,  and HS stretch.         PT Short Term Goals - 12/27/20 1124       PT SHORT TERM GOAL #1   Title Patient will demonstrate understanding of HEP to improve function to return to walking with decreased pain or close to pain free.    Baseline 12/5: NT    Time 2    Period Weeks    Status On-going    Target Date 01/08/21               PT Long Term Goals - 12/27/20 1124       PT LONG TERM GOAL #1   Title Patient will have improved function and activity level as evidenced by an increase in FOTO score by 10 points or more.    Baseline 12/5: 69/76    Time 8    Period Weeks    Status On-going    Target Date 02/19/21      PT LONG TERM GOAL #2   Title Patient will improve right knee flexion to improve pull on patella from quad tendon to improve motion of patella in order to decrease right knee pain.    Baseline 12/5: Right Knee Flex AROM 120 PROM 130    Time 8    Period Weeks    Status On-going    Target Date 02/19/21      PT LONG TERM GOAL #3   Title Patient will increase hip strength by 1/2 MMT offload abnormal forces on knee to improve knee pain and to improve function with squatting and stair negotiation.    Baseline 12/5: Hip Ext R/L 4/4 Hip Abd 4+/4+    Time 8    Period Weeks    Status On-going    Target Date 02/19/21      PT LONG TERM GOAL #4   Time 8    Period Weeks    Status  New    Target Date 02/19/21                   Plan - 12/27/20 1128     Clinical Impression Statement Pt presents for f/u for right knee pain 2/2 to chondromalacia of patella. She experiences discomfort with right knee flexion and extension and hip weakness with adduction and extension. No mobility restrictions detected in patella at terminal knee extension. She was abe to perform all hip strengthening and stretches without an increase in her right knee pain. She will continue to benefit from skilled PT to increase right knee ROM and hip strength and to decrease right knee pain to  perform standing activities and walking for recreation without being limited by pain.    Personal Factors and Comorbidities Age;Time since onset of injury/illness/exacerbation;Past/Current Experience    Examination-Activity Limitations Squat;Stairs;Stand;Transfers;Locomotion Level    Examination-Participation Restrictions Cleaning;Community Activity    Stability/Clinical Decision Making Stable/Uncomplicated    Clinical Decision Making Low    Rehab Potential Good    PT Frequency 2x / week    PT Duration 8 weeks    PT Treatment/Interventions ADLs/Self Care Home Management;Aquatic Therapy;Cryotherapy;Electrical Stimulation;Moist Heat;Gait training;Stair training;Functional mobility training;Therapeutic activities;Therapeutic exercise;Neuromuscular re-education;Balance training;Joint Manipulations;Dry needling;Passive range of motion;Manual techniques;Patient/family education    PT Next Visit Plan Begin patellar mobilizations and soft tissue work on musculature around knee. Progress hip and knee strengthening exercises    PT Home Exercise Plan [email or web address]    Consulted and Agree with Plan of Care Patient            HEP includes the following:   Access Code: [email or web address] URL: [email or web address] Date: 12/27/2020 Prepared by: Bradly Chris  Exercises Prone Quadriceps Stretch with Strap - 1 x daily - 7 x weekly - 1 sets - 3 reps - 60 hold Seated Table Hamstring Stretch - 1 x daily - 7 x weekly - 1 sets - 5 reps - 60 hold Long Sitting Hamstring Stretch - 1 x daily - 7 x weekly - 1 sets - 5 reps - 60 hold Supine Bridge - 1 x daily - 3 x weekly - 3 sets - 10 reps Sidelying Hip Abduction - 1 x daily - 3 x weekly - 3 sets - 10 reps  Patient will benefit from skilled therapeutic intervention in order to improve the following deficits and impairments:  Abnormal gait, Decreased strength, Decreased range of motion, Pain, Impaired flexibility  Visit Diagnosis: Chronic pain of right  knee     Problem List Patient Active Problem List   Diagnosis Date Noted   Polyp of colon    Cerumen impaction 05/05/2019   History of PSVT (paroxysmal supraventricular tachycardia) 06/17/2017   Recurrent UTI 08/23/2016   Estrogen deficiency 12/06/2015   Elevated blood sugar 12/06/2015   Incontinence of urine in female 04/12/2015   Medicare annual wellness visit, subsequent 11/30/2014   Routine general medical examination at a health care facility 11/05/2012   PLANTAR FASCIITIS, LEFT 07/28/2009   PERSONAL HX COLONIC POLYPS 11/02/2007   GERD 10/20/2006   Bradly Chris PT, DPT  12/27/2020, 11:35 AM  [facility] [address] [place], [address] Phone: [phone]   Fax:  [phone]  Name: Keyetta Hollingworth MRN: [id number] Date of Birth: November 27, 1950

## 2021-01-01 ENCOUNTER — Ambulatory Visit: Payer: Medicare PPO | Admitting: Physical Therapy

## 2021-01-01 DIAGNOSIS — M25561 Pain in right knee: Secondary | ICD-10-CM | POA: Diagnosis not present

## 2021-01-01 DIAGNOSIS — G8929 Other chronic pain: Secondary | ICD-10-CM | POA: Diagnosis not present

## 2021-01-01 NOTE — Therapy (Signed)
[facility] [address], [place], [address] Phone: [phone]   Fax:  [phone]  Physical Therapy Treatment  Patient Details  Name: Shannon [name] MRN: [id number] Date of Birth: May 01, 1950 Referring Provider (PT): Thornton Park MD   Encounter Date: 01/01/2021   PT End of Session - 01/01/21 0939     Visit Number 3    Number of Visits 16    Date for PT Re-Evaluation 02/19/21    Progress Note Due on Visit 10    PT Start Time 0930    PT Stop Time 1015    PT Time Calculation (min) 45 min    Activity Tolerance Patient tolerated treatment well    Behavior During Therapy [facility] for tasks assessed/performed             Past Medical History:  Diagnosis Date   Arthritis    left foot   Blood transfusion without reported diagnosis    Chronic neck and back pain    received physical therapy   Dysrhythmia    Pt told she had "benign arrythmia" after wearing monitor   GERD (gastroesophageal reflux disease)    with esophagitis   Motion sickness    boats, planes   Numbness    Right outer thigh, constant, notices more with standing for a long period of time   Plantar fasciitis    left    Past Surgical History:  Procedure Laterality Date   ARTHRODESIS METATARSAL Left 12/30/2018   Procedure: ARTHRODESIS,LISFRANC;MULTIPLE LEFT;  Surgeon: Samara Deist, DPM;  Location: [facility];  Service: Podiatry;  Laterality: Left;  general with local   BLADDER SUSPENSION N/A 07/04/2015   Procedure: TRANSVAGINAL TAPE (TVT) SLING                   ;  Surgeon: Emily Filbert, MD;  Location: [facility];  Service: Gynecology;  Laterality: N/A;   COLONOSCOPY     COLONOSCOPY WITH PROPOFOL N/A 09/08/2019   Procedure: COLONOSCOPY WITH PROPOFOL;  Surgeon: Virgel Manifold, MD;  Location: [facility];  Service: Endoscopy;  Laterality: N/A;   CYSTOCELE REPAIR N/A 07/04/2015   Procedure: ANTERIOR REPAIR (CYSTOCELE);   Surgeon: Emily Filbert, MD;  Location: [facility];  Service: Gynecology;  Laterality: N/A;   CYSTOSCOPY N/A 07/04/2015   Procedure: CYSTOSCOPY;  Surgeon: Emily Filbert, MD;  Location: [facility];  Service: Gynecology;  Laterality: N/A;   DILATION AND CURETTAGE OF UTERUS     EXCISION MORTON'S NEUROMA Left 08/12/2018   Procedure: EXCISION MORTON'S NEUROMA;  Surgeon: Samara Deist, DPM;  Location: [place] [facility];  Service: Podiatry;  Laterality: Left;  lma local   POLYPECTOMY     TUBAL LIGATION  1987   UPPER GI ENDOSCOPY     VAGINAL HYSTERECTOMY N/A 07/04/2015   Procedure: TOTAL VAGINAL HYSTERECTOMY  ;  Surgeon: Emily Filbert, MD;  Location: [facility];  Service: Gynecology;  Laterality: N/A;    There were no vitals filed for this visit.   Subjective Assessment - 01/01/21 0938     Subjective Pt reports a significant increase in pain over the weekend that resolved after performing stretches especially hamstring stretch.    Pertinent History chrondromalacia of patella    Limitations Other (comment)   Standing up from a low seat   How long can you sit comfortably? N/a    How long can you stand comfortably? Tighteness up when she is standing for a  long time    How long can you walk comfortably? End of day feels more sore    Diagnostic tests Not listed in chart    Patient Stated Goals Feel less stiffness and pain in the right knee    Currently in Pain? No/denies    Pain Onset More than a month ago            THEREX:  Nu-Step Level 2 Resistance, Seat Adjustment to 8 and Arms to 4, Duration 10 min Right IT Band Stretch 5 x 30 sec  Hip ER Red TB 3 x 10  Piriformis Stretch 3 x 30 sec  Hip Adduction Isometric  2 x 10 with 5 sec hold    Updated HEP and educated patient on changes to exercises and addition of new exercises hip adduction isometric and piriformis stretch and IT band stretch.              PT Education - 01/01/21 [phone]     Education provided Yes    Education Details form and  technique during execise for appropriate form    Person(s) Educated Patient    Methods Explanation    Comprehension Verbalized understanding;Returned demonstration;Verbal cues required              PT Short Term Goals - 01/01/21 0940       PT SHORT TERM GOAL #1   Title Patient will demonstrate understanding of HEP to improve function to return to walking with decreased pain or close to pain free.    Baseline 12/5: NT    Time 2    Period Weeks    Status On-going    Target Date 01/08/21               PT Long Term Goals - 01/01/21 0941       PT LONG TERM GOAL #1   Title Patient will have improved function and activity level as evidenced by an increase in FOTO score by 10 points or more.    Baseline 12/5: 69/76    Time 8    Period Weeks    Status On-going    Target Date 02/19/21      PT LONG TERM GOAL #2   Title Patient will improve right knee flexion to improve pull on patella from quad tendon to improve motion of patella in order to decrease right knee pain.    Baseline 12/5: Right Knee Flex AROM 120 PROM 130    Time 8    Period Weeks    Status On-going    Target Date 02/19/21      PT LONG TERM GOAL #3   Title Patient will increase hip strength by 1/2 MMT offload abnormal forces on knee to improve knee pain and to improve function with squatting and stair negotiation.    Baseline 12/5: Hip Ext R/L 4/4 Hip Abd 4+/4+    Time 8    Period Weeks    Status On-going    Target Date 02/19/21      PT LONG TERM GOAL #4   Time 8    Period Weeks    Status New    Target Date 02/19/21                   Plan - 01/01/21 0940     Clinical Impression Statement Pt presents for f/u for right knee pain 2/2 to chondromalacia of patella. She exhibits improvement in right knee pain response and continues to respond  to hamstring and calf response with relief to increased musculature tension behind right knee. She reports resolution of symptoms since completing these  stretches. Pt was able to complete all her exercises without an increase in right knee pain or crepitus. She will continue to benefit from skilled PT to decrease right knee pain, increase right knee strength and ROM to resume walking for aerobic activity within a pain free range.    Personal Factors and Comorbidities Age;Time since onset of injury/illness/exacerbation;Past/Current Experience    Examination-Activity Limitations Squat;Stairs;Stand;Transfers;Locomotion Level    Examination-Participation Restrictions Cleaning;Community Activity    Stability/Clinical Decision Making Stable/Uncomplicated    Clinical Decision Making Low    Rehab Potential Good    PT Frequency 2x / week    PT Duration 8 weeks    PT Treatment/Interventions ADLs/Self Care Home Management;Aquatic Therapy;Cryotherapy;Electrical Stimulation;Moist Heat;Gait training;Stair training;Functional mobility training;Therapeutic activities;Therapeutic exercise;Neuromuscular re-education;Balance training;Joint Manipulations;Dry needling;Passive range of motion;Manual techniques;Patient/family education    PT Next Visit Plan Side step down, taping if necessary, progress knee strengthening exercises (HS curls, step ups, and side step down) Trial walking on TM    PT Home Exercise Plan [email or web address]    Consulted and Agree with Plan of Care Patient            HEP includes the following:    Access Code: [email or web address] URL: [email or web address] Date: 01/01/2021 Prepared by: Bradly Chris  Exercises Sidelying ITB Stretch off Table - 1 x daily - 7 x weekly - 1 sets - 3 reps - 60 hold Prone Quadriceps Stretch with Strap - 1 x daily - 7 x weekly - 1 sets - 3 reps - 60 hold Supine Figure 4 Piriformis Stretch - 1 x daily - 7 x weekly - 1 sets - 3 reps - 30 hold Seated Table Hamstring Stretch - 1 x daily - 7 x weekly - 1 sets - 5 reps - 60 hold Long Sitting Hamstring Stretch - 1 x daily - 7 x weekly - 1 sets - 5 reps - 60  hold Supine Bridge - 1 x daily - 3 x weekly - 3 sets - 10 reps Sidelying Hip Abduction - 1 x daily - 3 x weekly - 3 sets - 10 reps Seated Hip Adduction Squeeze with Ball - 1 x daily - 3 x weekly - 3 sets - 10 reps - 5 hold    Patient will benefit from skilled therapeutic intervention in order to improve the following deficits and impairments:  Abnormal gait, Decreased strength, Decreased range of motion, Pain, Impaired flexibility  Visit Diagnosis: Chronic pain of right knee     Problem List Patient Active Problem List   Diagnosis Date Noted   Polyp of colon    Cerumen impaction 05/05/2019   History of PSVT (paroxysmal supraventricular tachycardia) 06/17/2017   Recurrent UTI 08/23/2016   Estrogen deficiency 12/06/2015   Elevated blood sugar 12/06/2015   Incontinence of urine in female 04/12/2015   Medicare annual wellness visit, subsequent 11/30/2014   Routine general medical examination at a health care facility 11/05/2012   PLANTAR FASCIITIS, LEFT 07/28/2009   PERSONAL HX COLONIC POLYPS 11/02/2007   GERD 10/20/2006   Bradly Chris PT, DPT  01/01/2021, 3:59 PM  [facility] [address] [place], [address] Phone: [phone]   Fax:  [phone]  Name: Gertie Broerman MRN: [id number] Date of Birth: 1950/04/02

## 2021-01-03 ENCOUNTER — Ambulatory Visit: Payer: Medicare PPO | Admitting: Physical Therapy

## 2021-01-03 DIAGNOSIS — G8929 Other chronic pain: Secondary | ICD-10-CM | POA: Diagnosis not present

## 2021-01-03 DIAGNOSIS — M25561 Pain in right knee: Secondary | ICD-10-CM | POA: Diagnosis not present

## 2021-01-03 NOTE — Therapy (Signed)
[facility] [address], [place], [address] Phone: [phone]   Fax:  [phone]  Physical Therapy Treatment  Patient Details  Name: Shannon [name] MRN: [id number] Date of Birth: [birth year] Referring Provider (PT): Thornton Park MD   Encounter Date: 01/03/2021   PT End of Session - 01/03/21 0939     Visit Number 4    Number of Visits 16    Date for PT Re-Evaluation 02/19/21    Progress Note Due on Visit 10    PT Start Time 0930    PT Stop Time 1000    PT Time Calculation (min) 30 min    Activity Tolerance Patient tolerated treatment well    Behavior During Therapy [facility] for tasks assessed/performed             Past Medical History:  Diagnosis Date   Arthritis    left foot   Blood transfusion without reported diagnosis    Chronic neck and back pain    received physical therapy   Dysrhythmia    Pt told she had "benign arrythmia" after wearing monitor   GERD (gastroesophageal reflux disease)    with esophagitis   Motion sickness    boats, planes   Numbness    Right outer thigh, constant, notices more with standing for a long period of time   Plantar fasciitis    left    Past Surgical History:  Procedure Laterality Date   ARTHRODESIS METATARSAL Left 12/30/2018   Procedure: ARTHRODESIS,LISFRANC;MULTIPLE LEFT;  Surgeon: Samara Deist, DPM;  Location: [facility];  Service: Podiatry;  Laterality: Left;  general with local   BLADDER SUSPENSION N/A 07/04/2015   Procedure: TRANSVAGINAL TAPE (TVT) SLING                   ;  Surgeon: Emily Filbert, MD;  Location: [facility];  Service: Gynecology;  Laterality: N/A;   COLONOSCOPY     COLONOSCOPY WITH PROPOFOL N/A 09/08/2019   Procedure: COLONOSCOPY WITH PROPOFOL;  Surgeon: Virgel Manifold, MD;  Location: [facility];  Service: Endoscopy;  Laterality: N/A;   CYSTOCELE REPAIR N/A 07/04/2015   Procedure: ANTERIOR REPAIR (CYSTOCELE);   Surgeon: Emily Filbert, MD;  Location: [facility];  Service: Gynecology;  Laterality: N/A;   CYSTOSCOPY N/A 07/04/2015   Procedure: CYSTOSCOPY;  Surgeon: Emily Filbert, MD;  Location: [facility];  Service: Gynecology;  Laterality: N/A;   DILATION AND CURETTAGE OF UTERUS     EXCISION MORTON'S NEUROMA Left 08/12/2018   Procedure: EXCISION MORTON'S NEUROMA;  Surgeon: Samara Deist, DPM;  Location: [facility];  Service: Podiatry;  Laterality: Left;  lma local   POLYPECTOMY     TUBAL LIGATION  1987   UPPER GI ENDOSCOPY     VAGINAL HYSTERECTOMY N/A 07/04/2015   Procedure: TOTAL VAGINAL HYSTERECTOMY  ;  Surgeon: Emily Filbert, MD;  Location: [facility];  Service: Gynecology;  Laterality: N/A;    There were no vitals filed for this visit.   Subjective Assessment - 01/03/21 0939     Subjective Pt reports that she continues to feel relief in her right knee from stretching. She has walked every day for 30 minutes briskly and still feels no pain. She reports being able to squat and kneel without pain in the front of her right knee.    Pertinent History chrondromalacia of patella    Limitations Other (comment)   Standing up from a low  seat   How long can you sit comfortably? N/a    How long can you stand comfortably? Tighteness up when she is standing for a long time    How long can you walk comfortably? End of day feels more sore    Diagnostic tests Not listed in chart    Patient Stated Goals Feel less stiffness and pain in the right knee    Currently in Pain? No/denies    Pain Onset More than a month ago             THEREX:  TM at 1.0 mph for 7 min   Knee AROM  -Flexion R/L: 135/135  Knee PROM  -Flexion R/L: 135/135   MMT  Hip Abduction R/L 5/5     Reviewed exercises with patient and added progressions to strengthening exercises. Discussed which exercises to do and not do if knee pain returns.         PT Education - 01/03/21 [phone]     Education provided Yes    Education Details  form and technique for appropriate form for exercise    Person(s) Educated Patient    Methods Explanation;Demonstration;Verbal cues;Handout    Comprehension Verbalized understanding;Returned demonstration;Verbal cues required              PT Short Term Goals - 01/03/21 1137       PT SHORT TERM GOAL #1   Title Patient will demonstrate understanding of HEP to improve function to return to walking with decreased pain or close to pain free.    Baseline 12/5: NT    Time 2    Period Weeks    Status On-going    Target Date 01/08/21               PT Long Term Goals - 01/03/21 1138       PT LONG TERM GOAL #1   Title Patient will have improved function and activity level as evidenced by an increase in FOTO score by 10 points or more.    Baseline 12/5: 69/76 12/14: 91/76    Time 8    Period Weeks    Status Achieved    Target Date 02/19/21      PT LONG TERM GOAL #2   Title Patient will improve right knee flexion to improve pull on patella from quad tendon to improve motion of patella in order to decrease right knee pain.    Baseline 12/5: Right Knee Flex AROM 120 PROM 130 12/14: Right Flex AROM 135 PROM 135    Time 8    Period Weeks    Status Achieved    Target Date 02/19/21      PT LONG TERM GOAL #3   Title Patient will increase hip strength by 1/2 MMT offload abnormal forces on knee to improve knee pain and to improve function with squatting and stair negotiation.    Baseline 12/5: Hip Ext R/L 4/4 Hip Abd 4+/4+ 12/14: Hip Ext not tested by mistake, Hip Abd 5/5    Time 8    Period Weeks    Status Partially Met    Target Date 02/19/21      PT LONG TERM GOAL #4   Time 8    Target Date 02/19/21            HEP includes:   Access Code: [email or web address] URL: [email or web address] Date: 01/03/2021 Prepared by: Bradly Chris  Exercises Sidelying ITB Stretch off Table - 1 x daily - 7 x weekly -  1 sets - 3 reps - 60 hold Prone Quadriceps Stretch with Strap  - 1 x daily - 7 x weekly - 1 sets - 3 reps - 60 hold Supine Figure 4 Piriformis Stretch - 1 x daily - 7 x weekly - 1 sets - 3 reps - 30 hold Seated Table Hamstring Stretch - 1 x daily - 7 x weekly - 1 sets - 5 reps - 60 hold Long Sitting Hamstring Stretch - 1 x daily - 7 x weekly - 1 sets - 5 reps - 60 hold Sidelying Hip Abduction - 1 x daily - 3 x weekly - 3 sets - 10 reps Seated Hip Adduction Squeeze with Ball - 1 x daily - 3 x weekly - 3 sets - 10 reps - 5 hold Sidelying Hip Adduction - 1 x daily - 3 x weekly - 3 sets - 10 reps Mini Squat - 1 x daily - 7 x weekly - 3 sets - 10 reps Supine Bridge - 1 x daily - 3 x weekly - 3 sets - 10 reps        Plan - 01/03/21 1135     Clinical Impression Statement Pt has reached all of her goals, and most importantly she is no longer experiencing increased right knee pain with activity. She has been provided  with a HEP and explanation about how to progress exercises. Pt is ready to independently maintain gains made in PT and she is ready for d/c.    Personal Factors and Comorbidities Age;Time since onset of injury/illness/exacerbation;Past/Current Experience    Examination-Activity Limitations Squat;Stairs;Stand;Transfers;Locomotion Level    Examination-Participation Restrictions Cleaning;Community Activity    Stability/Clinical Decision Making Stable/Uncomplicated    Clinical Decision Making Low    Rehab Potential Good    PT Frequency 2x / week    PT Duration 8 weeks    PT Treatment/Interventions ADLs/Self Care Home Management;Aquatic Therapy;Cryotherapy;Electrical Stimulation;Moist Heat;Gait training;Stair training;Functional mobility training;Therapeutic activities;Therapeutic exercise;Neuromuscular re-education;Balance training;Joint Manipulations;Dry needling;Passive range of motion;Manual techniques;Patient/family education    PT Next Visit Plan N/a. Discharge from [facility] and Agree with Plan of Care  Patient             Patient will benefit from skilled therapeutic intervention in order to improve the following deficits and impairments:  Abnormal gait, Decreased strength, Decreased range of motion, Pain, Impaired flexibility  Visit Diagnosis: Chronic pain of right knee     Problem List Patient Active Problem List   Diagnosis Date Noted   Polyp of colon    Cerumen impaction 05/05/2019   History of PSVT (paroxysmal supraventricular tachycardia) 06/17/2017   Recurrent UTI 08/23/2016   Estrogen deficiency 12/06/2015   Elevated blood sugar 12/06/2015   Incontinence of urine in female 04/12/2015   Medicare annual wellness visit, subsequent 11/30/2014   Routine general medical examination at a health care facility 11/05/2012   PLANTAR FASCIITIS, LEFT 07/28/2009   PERSONAL HX COLONIC POLYPS 11/02/2007   GERD 10/20/2006   Bradly Chris PT, DPT  01/03/2021, 3:52 PM  [facility] [address], [place], [address] Phone: [phone]   Fax:  [phone]  Name: Shannon [name] MRN: [id number] Date of Birth: October 10, 1950

## 2021-01-04 ENCOUNTER — Encounter: Payer: Medicare PPO | Admitting: Physical Therapy

## 2021-01-08 ENCOUNTER — Ambulatory Visit: Payer: Medicare PPO | Admitting: Physical Therapy

## 2021-01-09 ENCOUNTER — Encounter: Payer: Medicare PPO | Admitting: Physical Therapy

## 2021-01-10 ENCOUNTER — Ambulatory Visit: Payer: Medicare PPO | Admitting: Physical Therapy

## 2021-01-11 ENCOUNTER — Encounter: Payer: Medicare PPO | Admitting: Physical Therapy

## 2021-01-17 ENCOUNTER — Encounter: Payer: Medicare PPO | Admitting: Physical Therapy

## 2021-01-23 ENCOUNTER — Encounter: Payer: Medicare PPO | Admitting: Physical Therapy

## 2021-01-25 ENCOUNTER — Encounter: Payer: Medicare PPO | Admitting: Physical Therapy

## 2021-01-29 ENCOUNTER — Encounter: Payer: Medicare PPO | Admitting: Physical Therapy

## 2021-01-30 ENCOUNTER — Encounter: Payer: Medicare PPO | Admitting: Physical Therapy

## 2021-01-31 ENCOUNTER — Encounter: Payer: Medicare PPO | Admitting: Physical Therapy

## 2021-02-07 ENCOUNTER — Encounter: Payer: Medicare PPO | Admitting: Physical Therapy

## 2021-02-08 ENCOUNTER — Encounter: Payer: Medicare PPO | Admitting: Physical Therapy

## 2021-02-12 ENCOUNTER — Encounter: Payer: Medicare PPO | Admitting: Physical Therapy

## 2021-02-14 ENCOUNTER — Encounter: Payer: Medicare PPO | Admitting: Physical Therapy

## 2021-02-19 ENCOUNTER — Encounter: Payer: Medicare PPO | Admitting: Physical Therapy

## 2021-02-21 ENCOUNTER — Encounter: Payer: Medicare PPO | Admitting: Physical Therapy

## 2021-03-21 ENCOUNTER — Ambulatory Visit: Payer: Medicare PPO | Admitting: Orthopaedic Surgery

## 2021-03-21 ENCOUNTER — Encounter: Payer: Self-pay | Admitting: Physician Assistant

## 2021-03-21 ENCOUNTER — Ambulatory Visit (INDEPENDENT_AMBULATORY_CARE_PROVIDER_SITE_OTHER): Payer: Medicare PPO

## 2021-03-21 ENCOUNTER — Other Ambulatory Visit: Payer: Self-pay

## 2021-03-21 DIAGNOSIS — M25561 Pain in right knee: Secondary | ICD-10-CM | POA: Diagnosis not present

## 2021-03-21 DIAGNOSIS — G8929 Other chronic pain: Secondary | ICD-10-CM | POA: Diagnosis not present

## 2021-03-21 DIAGNOSIS — M170 Bilateral primary osteoarthritis of knee: Secondary | ICD-10-CM

## 2021-03-21 NOTE — Progress Notes (Signed)
? ?Office Visit Note ?  ?Patient: Shannon [name]           ?Date of Birth: 05-21-1950           ?MRN: [id number] ?Visit Date: 03/21/2021 ?             ?Requested by: Tower, Wynelle Fanny, MD ?[address] ?[place],  [place] [address] ?PCP: Abner Greenspan, MD ? ? ?Assessment & Plan: ?Visit Diagnoses:  ?1. Bilateral primary osteoarthritis of knee   ?2. Chronic pain of right knee   ? ? ?Plan: Impression is right knee degenerative medial and lateral meniscus tears.  The patient has been through a formal course of physical therapy as well as had only temporary relief from cortisone injection.  Would like to go ahead and order an MRI to assess for structural abnormalities.  She will follow-up with [name] once this is been completed.  Call with concerns or questions in the meantime. ? ?Follow-Up Instructions: Return for after MRI.  ? ?Orders:  ?Orders Placed This Encounter  ?Procedures  ? XR KNEE 3 VIEW RIGHT  ? ?No orders of the defined types were placed in this encounter. ? ? ? ? Procedures: ?No procedures performed ? ? ?Clinical Data: ?No additional findings. ? ? ?Subjective: ?Chief Complaint  ?Patient presents with  ? Right Knee - Pain  ? ? ?HPI patient is a very pleasant 71 year old female who comes in today with right knee pain for the past 5 months.  She notes that she was working on a home exercise program for her back when she was performing butterfly type exercises with external rotation of the hip and flexion of the knee.  Following these exercises, she noted increased pain to the right knee.  The pain has primarily been to the medial aspect but does note on occasion that it radiates throughout the entire knee.  She has associated stiffness.  Pain is worse with any twisting of the knee.  She has been using topical Voltaren without significant relief.  She was seen by an orthopedist in [place] where she underwent cortisone injection.  This helped but only lasted for about 6 weeks.  She has been in physical  therapy for several weeks which did somewhat help with stiffness but not the pain.  She has not undergone MRI of the right knee.  And she denies any pain prior to performing the exercise. ? ?Review of Systems as detailed in HPI.  All others reviewed are negative. ? ? ?Objective: ?Vital Signs: There were no vitals taken for this visit. ? ?Physical Exam well-developed well-nourished female in no acute distress.  Alert and oriented x3. ? ?Ortho Exam right knee exam shows no effusion.  Range of motion 0 to 100 degrees.  Marked tenderness medial and lateral joint lines.  Mild patellofemoral crepitus.  Ligaments are stable.  She is neurovascular intact distally. ? ?Specialty Comments:  ?No specialty comments available. ? ?Imaging: ?No results found. ? ? ?PMFS History: ?Patient Active Problem List  ? Diagnosis Date Noted  ? Polyp of colon   ? Cerumen impaction 05/05/2019  ? History of PSVT (paroxysmal supraventricular tachycardia) 06/17/2017  ? Recurrent UTI 08/23/2016  ? Estrogen deficiency 12/06/2015  ? Elevated blood sugar 12/06/2015  ? Incontinence of urine in female 04/12/2015  ? Medicare annual wellness visit, subsequent 11/30/2014  ? Routine general medical examination at a health care facility 11/05/2012  ? PLANTAR FASCIITIS, LEFT 07/28/2009  ? PERSONAL HX COLONIC POLYPS 11/02/2007  ?  GERD 10/20/2006  ? ?Past Medical History:  ?Diagnosis Date  ? Arthritis   ? left foot  ? Blood transfusion without reported diagnosis   ? Chronic neck and back pain   ? received physical therapy  ? Dysrhythmia   ? Pt told she had "benign arrythmia" after wearing monitor  ? GERD (gastroesophageal reflux disease)   ? with esophagitis  ? Motion sickness   ? boats, planes  ? Numbness   ? Right outer thigh, constant, notices more with standing for a long period of time  ? Plantar fasciitis   ? left  ?  ?Family History  ?Problem Relation Age of Onset  ? Stroke Mother   ?     x 2  ? Heart disease Mother   ?     congenital arrhythmia and CHF   ? Depression Mother   ? Atrial fibrillation Mother   ? Prostate cancer Brother   ? Atrial fibrillation Brother   ? Stomach cancer Paternal Uncle   ? Colon cancer Neg Hx   ? Esophageal cancer Neg Hx   ? Kidney cancer Neg Hx   ? Bladder Cancer Neg Hx   ?  ?Past Surgical History:  ?Procedure Laterality Date  ? ARTHRODESIS METATARSAL Left 12/30/2018  ? Procedure: ARTHRODESIS,LISFRANC;MULTIPLE LEFT;  Surgeon: Samara Deist, DPM;  Location: [facility];  Service: Podiatry;  Laterality: Left;  general with local  ? BLADDER SUSPENSION N/A 07/04/2015  ? Procedure: TRANSVAGINAL TAPE (TVT) SLING                   ;  Surgeon: Emily Filbert, MD;  Location: [facility];  Service: Gynecology;  Laterality: N/A;  ? COLONOSCOPY    ? COLONOSCOPY WITH PROPOFOL N/A 09/08/2019  ? Procedure: COLONOSCOPY WITH PROPOFOL;  Surgeon: Virgel Manifold, MD;  Location: [facility];  Service: Endoscopy;  Laterality: N/A;  ? CYSTOCELE REPAIR N/A 07/04/2015  ? Procedure: ANTERIOR REPAIR (CYSTOCELE);  Surgeon: Emily Filbert, MD;  Location: [facility];  Service: Gynecology;  Laterality: N/A;  ? CYSTOSCOPY N/A 07/04/2015  ? Procedure: CYSTOSCOPY;  Surgeon: Emily Filbert, MD;  Location: [facility];  Service: Gynecology;  Laterality: N/A;  ? DILATION AND CURETTAGE OF UTERUS    ? EXCISION MORTON'S NEUROMA Left 08/12/2018  ? Procedure: EXCISION MORTON'S NEUROMA;  Surgeon: Samara Deist, DPM;  Location: [facility];  Service: Podiatry;  Laterality: Left;  lma local  ? POLYPECTOMY    ? TUBAL LIGATION  1987  ? UPPER GI ENDOSCOPY    ? VAGINAL HYSTERECTOMY N/A 07/04/2015  ? Procedure: TOTAL VAGINAL HYSTERECTOMY  ;  Surgeon: Emily Filbert, MD;  Location: [facility];  Service: Gynecology;  Laterality: N/A;  ? ?Social History  ? ?Occupational History  ? Not on file  ?Tobacco Use  ? Smoking status: Never  ? Smokeless tobacco: Never  ?Vaping Use  ? Vaping Use: Never used  ?Substance and Sexual Activity  ? Alcohol use: No  ?  Alcohol/week: 0.0 standard drinks  ?  Comment:  rare  ? Drug use: No  ? Sexual activity: Yes  ?  Birth control/protection: Post-menopausal, Surgical  ? ? ? ? ? ? ?

## 2021-03-23 ENCOUNTER — Ambulatory Visit
Admission: RE | Admit: 2021-03-23 | Discharge: 2021-03-23 | Disposition: A | Discharge status: Home / Self Care | Payer: Medicare PPO | Source: Ambulatory Visit | Attending: Physician Assistant | Admitting: Physician Assistant

## 2021-03-23 ENCOUNTER — Other Ambulatory Visit: Payer: Medicare PPO

## 2021-03-23 DIAGNOSIS — G8929 Other chronic pain: Secondary | ICD-10-CM

## 2021-03-23 DIAGNOSIS — M25561 Pain in right knee: Secondary | ICD-10-CM | POA: Diagnosis not present

## 2021-03-26 NOTE — Progress Notes (Signed)
F/u to discuss

## 2021-03-28 ENCOUNTER — Telehealth: Payer: Self-pay

## 2021-03-28 ENCOUNTER — Encounter: Payer: Self-pay | Admitting: Orthopaedic Surgery

## 2021-03-28 ENCOUNTER — Other Ambulatory Visit: Payer: Self-pay

## 2021-03-28 ENCOUNTER — Ambulatory Visit: Payer: Medicare PPO | Admitting: Orthopaedic Surgery

## 2021-03-28 VITALS — Ht 69.0 in | Wt 164.0 lb

## 2021-03-28 DIAGNOSIS — M1711 Unilateral primary osteoarthritis, right knee: Secondary | ICD-10-CM

## 2021-03-28 NOTE — Telephone Encounter (Signed)
Please submit for right knee gel inj 

## 2021-03-28 NOTE — Progress Notes (Signed)
? ?Office Visit Note ?  ?Patient: Shannon [name]           ?Date of Birth: 1950/05/05           ?MRN: [id number] ?Visit Date: 03/28/2021 ?             ?Requested by: Tower, Wynelle Fanny, MD ?[address] ?[place],  [place] [address] ?PCP: Abner Greenspan, MD ? ? ?Assessment & Plan: ?Visit Diagnoses:  ?1. Primary osteoarthritis of right knee   ? ? ?Plan: Based on findings of the MRI which patient has tricompartmental DJD that is worse in the lateral compartment which is essentially bone-on-bone.  She has macerated anterior horn lateral meniscus tear as well.  Based on these findings treatment options were discussed and reviewed and associated pros and cons.  Based on her options she would like to try viscosupplementation.  I plan to aspirate any effusion at that time as well.  She has had 1 cortisone injection in the past which helped tremendously but the relief was short-lived.  She will also contact Debbie to look at the surgery schedule. ? ?Follow-Up Instructions: No follow-ups on file.  ? ?Orders:  ?No orders of the defined types were placed in this encounter. ? ?No orders of the defined types were placed in this encounter. ? ? ? ? Procedures: ?No procedures performed ? ? ?Clinical Data: ?No additional findings. ? ? ?Subjective: ?Chief Complaint  ?Patient presents with  ? Right Knee - Pain, Follow-up  ?  MRI review right knee  ? ? ?HPI ? ?Patient returns today to discuss right knee MRI.  She reports constant posterior lateral knee pain and swelling with occasional sudden catching pain towards the anterior lateral aspect of the knee. ? ?Review of Systems  ?Constitutional: Negative.   ?HENT: Negative.    ?Eyes: Negative.   ?Respiratory: Negative.    ?Cardiovascular: Negative.   ?Endocrine: Negative.   ?Musculoskeletal: Negative.   ?Neurological: Negative.   ?Hematological: Negative.   ?Psychiatric/Behavioral: Negative.    ?All other systems reviewed and are negative. ? ? ?Objective: ?Vital Signs: Ht '5\' 9"'$  (1.753  m)   Wt 164 lb (74.4 kg)   BMI 24.22 kg/m?  ? ?Physical Exam ?Vitals and nursing note reviewed.  ?Constitutional:   ?   Appearance: She is well-developed.  ?Pulmonary:  ?   Effort: Pulmonary effort is normal.  ?Skin: ?   General: Skin is warm.  ?   Capillary Refill: Capillary refill takes less than 2 seconds.  ?Neurological:  ?   Mental Status: She is alert and oriented to person, place, and time.  ?Psychiatric:     ?   Behavior: Behavior normal.     ?   Thought Content: Thought content normal.     ?   Judgment: Judgment normal.  ? ? ?Ortho Exam ? ?Examination of the right knee shows a small joint effusion.  Pain along the lateral joint line.  Collaterals and cruciates are stable.  She has mild limitation and flexion and extension due to pain and the effusion. ? ?Specialty Comments:  ?No specialty comments available. ? ?Imaging: ?No results found. ? ? ?PMFS History: ?Patient Active Problem List  ? Diagnosis Date Noted  ? Primary osteoarthritis of right knee 03/28/2021  ? Polyp of colon   ? Cerumen impaction 05/05/2019  ? History of PSVT (paroxysmal supraventricular tachycardia) 06/17/2017  ? Recurrent UTI 08/23/2016  ? Estrogen deficiency 12/06/2015  ? Elevated blood sugar 12/06/2015  ?  Incontinence of urine in female 04/12/2015  ? Medicare annual wellness visit, subsequent 11/30/2014  ? Routine general medical examination at a health care facility 11/05/2012  ? PLANTAR FASCIITIS, LEFT 07/28/2009  ? PERSONAL HX COLONIC POLYPS 11/02/2007  ? GERD 10/20/2006  ? ?Past Medical History:  ?Diagnosis Date  ? Arthritis   ? left foot  ? Blood transfusion without reported diagnosis   ? Chronic neck and back pain   ? received physical therapy  ? Dysrhythmia   ? Pt told she had "benign arrythmia" after wearing monitor  ? GERD (gastroesophageal reflux disease)   ? with esophagitis  ? Motion sickness   ? boats, planes  ? Numbness   ? Right outer thigh, constant, notices more with standing for a long period of time  ? Plantar  fasciitis   ? left  ?  ?Family History  ?Problem Relation Age of Onset  ? Stroke Mother   ?     x 2  ? Heart disease Mother   ?     congenital arrhythmia and CHF  ? Depression Mother   ? Atrial fibrillation Mother   ? Prostate cancer Brother   ? Atrial fibrillation Brother   ? Stomach cancer Paternal Uncle   ? Colon cancer Neg Hx   ? Esophageal cancer Neg Hx   ? Kidney cancer Neg Hx   ? Bladder Cancer Neg Hx   ?  ?Past Surgical History:  ?Procedure Laterality Date  ? ARTHRODESIS METATARSAL Left 12/30/2018  ? Procedure: ARTHRODESIS,LISFRANC;MULTIPLE LEFT;  Surgeon: Samara Deist, DPM;  Location: [facility];  Service: Podiatry;  Laterality: Left;  general with local  ? BLADDER SUSPENSION N/A 07/04/2015  ? Procedure: TRANSVAGINAL TAPE (TVT) SLING                   ;  Surgeon: Emily Filbert, MD;  Location: [facility];  Service: Gynecology;  Laterality: N/A;  ? COLONOSCOPY    ? COLONOSCOPY WITH PROPOFOL N/A 09/08/2019  ? Procedure: COLONOSCOPY WITH PROPOFOL;  Surgeon: Virgel Manifold, MD;  Location: [facility];  Service: Endoscopy;  Laterality: N/A;  ? CYSTOCELE REPAIR N/A 07/04/2015  ? Procedure: ANTERIOR REPAIR (CYSTOCELE);  Surgeon: Emily Filbert, MD;  Location: [facility];  Service: Gynecology;  Laterality: N/A;  ? CYSTOSCOPY N/A 07/04/2015  ? Procedure: CYSTOSCOPY;  Surgeon: Emily Filbert, MD;  Location: [facility];  Service: Gynecology;  Laterality: N/A;  ? DILATION AND CURETTAGE OF UTERUS    ? EXCISION MORTON'S NEUROMA Left 08/12/2018  ? Procedure: EXCISION MORTON'S NEUROMA;  Surgeon: Samara Deist, DPM;  Location: [facility];  Service: Podiatry;  Laterality: Left;  lma local  ? POLYPECTOMY    ? TUBAL LIGATION  1987  ? UPPER GI ENDOSCOPY    ? VAGINAL HYSTERECTOMY N/A 07/04/2015  ? Procedure: TOTAL VAGINAL HYSTERECTOMY  ;  Surgeon: Emily Filbert, MD;  Location: [facility];  Service: Gynecology;  Laterality: N/A;  ? ?Social History  ? ?Occupational History  ? Not on file  ?Tobacco Use  ? Smoking status: Never  ?  Smokeless tobacco: Never  ?Vaping Use  ? Vaping Use: Never used  ?Substance and Sexual Activity  ? Alcohol use: No  ?  Alcohol/week: 0.0 standard drinks  ?  Comment: rare  ? Drug use: No  ? Sexual activity: Yes  ?  Birth control/protection: Post-menopausal, Surgical  ? ? ? ? ? ? ?

## 2021-03-29 NOTE — Telephone Encounter (Signed)
Noted  

## 2021-04-05 ENCOUNTER — Other Ambulatory Visit: Payer: Self-pay | Admitting: Family Medicine

## 2021-04-07 ENCOUNTER — Encounter: Payer: Self-pay | Admitting: Orthopaedic Surgery

## 2021-04-11 ENCOUNTER — Telehealth: Payer: Self-pay

## 2021-04-11 NOTE — Telephone Encounter (Signed)
VOB submitted for Monovisc, right knee BV pending  

## 2021-04-12 ENCOUNTER — Telehealth: Payer: Self-pay

## 2021-04-12 NOTE — Telephone Encounter (Signed)
Approved for Monovisc, right knee. ?[place] ?Once OOP is met, patient is covered at 100%. ?Co-pay of $35.00 ?No PA required ? ?Appt. 04/19/2021 with Dr. Erlinda Hong ? ?

## 2021-04-19 ENCOUNTER — Encounter: Payer: Self-pay | Admitting: Orthopaedic Surgery

## 2021-04-19 ENCOUNTER — Ambulatory Visit: Payer: Medicare PPO | Admitting: Orthopaedic Surgery

## 2021-04-19 DIAGNOSIS — M1711 Unilateral primary osteoarthritis, right knee: Secondary | ICD-10-CM | POA: Diagnosis not present

## 2021-04-19 MED ORDER — HYALURONAN 88 MG/4ML IX SOSY
88.0000 mg | PREFILLED_SYRINGE | INTRA_ARTICULAR | Status: AC | PRN
  Administered 2021-04-19: 88 mg via INTRA_ARTICULAR

## 2021-04-19 MED ORDER — BUPIVACAINE HCL 0.25 % IJ SOLN
2.0000 mL | INTRAMUSCULAR | Status: AC | PRN
  Administered 2021-04-19: 2 mL via INTRA_ARTICULAR

## 2021-04-19 MED ORDER — LIDOCAINE HCL 1 % IJ SOLN
2.0000 mL | INTRAMUSCULAR | Status: AC | PRN
  Administered 2021-04-19: 2 mL

## 2021-04-19 NOTE — Progress Notes (Signed)
? ?Office Visit Note ?  ?Patient: Shannon [name]           ?Date of Birth: February 18, 1950           ?MRN: [id number] ?Visit Date: 04/19/2021 ?             ?Requested by: Tower, Wynelle Fanny, MD ?[address] ?[place],  [place] [address] ?PCP: Abner Greenspan, MD ? ? ?Assessment & Plan: ?Visit Diagnoses:  ?1. Unilateral primary osteoarthritis, right knee   ? ? ?Plan: Impression is right knee osteoarthritis.  Today we proceeded with right knee Monovisc injection.  She tolerated this well.  Patient does note that she has 2 trips coming up 1 in June and 1 in July would possibly like to undergo cortisone injection just prior.  Being that she is scheduled for total knee arthroplasty in late July, have discussed with her that her injection will need to be no later than 8 weeks prior to surgery.  She understands and agrees. ? ?Follow-Up Instructions: Return if symptoms worsen or fail to improve.  ? ?Orders:  ?No orders of the defined types were placed in this encounter. ? ?No orders of the defined types were placed in this encounter. ? ? ? ? Procedures: ?Large Joint Inj: R knee on 04/19/2021 8:23 AM ?Indications: pain ?Details: 22 G needle, anterolateral approach ?Medications: 2 mL lidocaine 1 %; 2 mL bupivacaine 0.25 %; 88 mg Hyaluronan 88 MG/4ML ? ? ? ? ?Clinical Data: ?No additional findings. ? ? ?Subjective: ?Chief Complaint  ?Patient presents with  ? Right Knee - Follow-up  ?  Monovisc  ? ? ?HPI patient is a pleasant 71 year old female with underlying right knee osteoarthritis who comes in today for Monovisc injection to the right knee.  She has previously undergone right knee cortisone injection which provided only 6 weeks relief.  She is scheduled to undergo right total knee replacement in late July of this year.  She has not previously undergone viscosupplementation injection. ? ? ? ? ?Objective: ?Vital Signs: There were no vitals taken for this visit. ? ? ? ?Ortho Exam stable right knee exam ? ?Specialty Comments:   ?No specialty comments available. ? ?Imaging: ?No new imaging ? ? ?PMFS History: ?Patient Active Problem List  ? Diagnosis Date Noted  ? Primary osteoarthritis of right knee 03/28/2021  ? Polyp of colon   ? Cerumen impaction 05/05/2019  ? History of PSVT (paroxysmal supraventricular tachycardia) 06/17/2017  ? Recurrent UTI 08/23/2016  ? Estrogen deficiency 12/06/2015  ? Elevated blood sugar 12/06/2015  ? Incontinence of urine in female 04/12/2015  ? Medicare annual wellness visit, subsequent 11/30/2014  ? Routine general medical examination at a health care facility 11/05/2012  ? PLANTAR FASCIITIS, LEFT 07/28/2009  ? PERSONAL HX COLONIC POLYPS 11/02/2007  ? GERD 10/20/2006  ? ?Past Medical History:  ?Diagnosis Date  ? Arthritis   ? left foot  ? Blood transfusion without reported diagnosis   ? Chronic neck and back pain   ? received physical therapy  ? Dysrhythmia   ? Pt told she had "benign arrythmia" after wearing monitor  ? GERD (gastroesophageal reflux disease)   ? with esophagitis  ? Motion sickness   ? boats, planes  ? Numbness   ? Right outer thigh, constant, notices more with standing for a long period of time  ? Plantar fasciitis   ? left  ?  ?Family History  ?Problem Relation Age of Onset  ? Stroke Mother   ?  x 2  ? Heart disease Mother   ?     congenital arrhythmia and CHF  ? Depression Mother   ? Atrial fibrillation Mother   ? Prostate cancer Brother   ? Atrial fibrillation Brother   ? Stomach cancer Paternal Uncle   ? Colon cancer Neg Hx   ? Esophageal cancer Neg Hx   ? Kidney cancer Neg Hx   ? Bladder Cancer Neg Hx   ?  ?Past Surgical History:  ?Procedure Laterality Date  ? ARTHRODESIS METATARSAL Left 12/30/2018  ? Procedure: ARTHRODESIS,LISFRANC;MULTIPLE LEFT;  Surgeon: Samara Deist, DPM;  Location: [facility];  Service: Podiatry;  Laterality: Left;  general with local  ? BLADDER SUSPENSION N/A 07/04/2015  ? Procedure: TRANSVAGINAL TAPE (TVT) SLING                   ;  Surgeon: Emily Filbert, MD;  Location: [facility];  Service: Gynecology;  Laterality: N/A;  ? COLONOSCOPY    ? COLONOSCOPY WITH PROPOFOL N/A 09/08/2019  ? Procedure: COLONOSCOPY WITH PROPOFOL;  Surgeon: Virgel Manifold, MD;  Location: [facility];  Service: Endoscopy;  Laterality: N/A;  ? CYSTOCELE REPAIR N/A 07/04/2015  ? Procedure: ANTERIOR REPAIR (CYSTOCELE);  Surgeon: Emily Filbert, MD;  Location: [facility];  Service: Gynecology;  Laterality: N/A;  ? CYSTOSCOPY N/A 07/04/2015  ? Procedure: CYSTOSCOPY;  Surgeon: Emily Filbert, MD;  Location: [facility];  Service: Gynecology;  Laterality: N/A;  ? DILATION AND CURETTAGE OF UTERUS    ? EXCISION MORTON'S NEUROMA Left 08/12/2018  ? Procedure: EXCISION MORTON'S NEUROMA;  Surgeon: Samara Deist, DPM;  Location: [facility];  Service: Podiatry;  Laterality: Left;  lma local  ? POLYPECTOMY    ? TUBAL LIGATION  1987  ? UPPER GI ENDOSCOPY    ? VAGINAL HYSTERECTOMY N/A 07/04/2015  ? Procedure: TOTAL VAGINAL HYSTERECTOMY  ;  Surgeon: Emily Filbert, MD;  Location: [facility];  Service: Gynecology;  Laterality: N/A;  ? ?Social History  ? ?Occupational History  ? Not on file  ?Tobacco Use  ? Smoking status: Never  ? Smokeless tobacco: Never  ?Vaping Use  ? Vaping Use: Never used  ?Substance and Sexual Activity  ? Alcohol use: No  ?  Alcohol/week: 0.0 standard drinks  ?  Comment: rare  ? Drug use: No  ? Sexual activity: Yes  ?  Birth control/protection: Post-menopausal, Surgical  ? ? ? ? ? ? ?

## 2021-05-21 ENCOUNTER — Ambulatory Visit
Admission: RE | Admit: 2021-05-21 | Discharge: 2021-05-21 | Disposition: A | Discharge status: Home / Self Care | Payer: Medicare PPO | Source: Ambulatory Visit | Attending: Family Medicine | Admitting: Family Medicine

## 2021-05-21 DIAGNOSIS — Z1231 Encounter for screening mammogram for malignant neoplasm of breast: Secondary | ICD-10-CM

## 2021-05-31 ENCOUNTER — Telehealth: Payer: Self-pay | Admitting: Family Medicine

## 2021-05-31 DIAGNOSIS — Z01818 Encounter for other preprocedural examination: Secondary | ICD-10-CM | POA: Insufficient documentation

## 2021-05-31 DIAGNOSIS — Z0000 Encounter for general adult medical examination without abnormal findings: Secondary | ICD-10-CM

## 2021-05-31 DIAGNOSIS — R739 Hyperglycemia, unspecified: Secondary | ICD-10-CM

## 2021-05-31 NOTE — Telephone Encounter (Signed)
-----   Message from Velna Hatchet, RT sent at 05/14/2021  9:48 AM EDT ----- ?Regarding: Lab Fri 06/01/21 ?Patient is scheduled for cpx, please order future labs.  Thanks, Anda Kraft  ? ?

## 2021-06-01 ENCOUNTER — Other Ambulatory Visit (INDEPENDENT_AMBULATORY_CARE_PROVIDER_SITE_OTHER): Payer: Medicare PPO

## 2021-06-01 DIAGNOSIS — Z0000 Encounter for general adult medical examination without abnormal findings: Secondary | ICD-10-CM | POA: Diagnosis not present

## 2021-06-01 DIAGNOSIS — R739 Hyperglycemia, unspecified: Secondary | ICD-10-CM

## 2021-06-01 LAB — COMPREHENSIVE METABOLIC PANEL
ALT: 14 U/L (ref 0–35)
AST: 17 U/L (ref 0–37)
Albumin: 4.1 g/dL (ref 3.5–5.2)
Alkaline Phosphatase: 78 U/L (ref 39–117)
BUN: 24 mg/dL — ABNORMAL HIGH (ref 6–23)
CO2: 30 mEq/L (ref 19–32)
Calcium: 9.5 mg/dL (ref 8.4–10.5)
Chloride: 103 mEq/L (ref 96–112)
Creatinine, Ser: 0.74 mg/dL (ref 0.40–1.20)
GFR: 80.58 mL/min (ref >60.00)
Glucose, Bld: 104 mg/dL — ABNORMAL HIGH (ref 70–99)
Potassium: 4.3 mEq/L (ref 3.5–5.1)
Sodium: 140 mEq/L (ref 135–145)
Total Bilirubin: 0.6 mg/dL (ref 0.2–1.2)
Total Protein: 6.7 g/dL (ref 6.0–8.3)

## 2021-06-01 LAB — LIPID PANEL
Cholesterol: 169 mg/dL (ref 0–200)
HDL: 79.9 mg/dL (ref >39.00)
LDL Cholesterol: 79 mg/dL (ref 0–99)
NonHDL: 88.72
Total CHOL/HDL Ratio: 2
Triglycerides: 47 mg/dL (ref 0.0–149.0)
VLDL: 9.4 mg/dL (ref 0.0–40.0)

## 2021-06-01 LAB — CBC WITH DIFFERENTIAL/PLATELET
Basophils Absolute: 0 10*3/uL (ref 0.0–0.1)
Basophils Relative: 0.7 % (ref 0.0–3.0)
Eosinophils Absolute: 0.1 10*3/uL (ref 0.0–0.7)
Eosinophils Relative: 2.2 % (ref 0.0–5.0)
HCT: 40.7 % (ref 36.0–46.0)
Hemoglobin: 13.6 g/dL (ref 12.0–15.0)
Lymphocytes Relative: 22.4 % (ref 12.0–46.0)
Lymphs Abs: 1.3 10*3/uL (ref 0.7–4.0)
MCHC: 33.3 g/dL (ref 30.0–36.0)
MCV: 90.5 fl (ref 78.0–100.0)
Monocytes Absolute: 0.6 10*3/uL (ref 0.1–1.0)
Monocytes Relative: 10 % (ref 3.0–12.0)
Neutro Abs: 3.7 10*3/uL (ref 1.4–7.7)
Neutrophils Relative %: 64.7 % (ref 43.0–77.0)
Platelets: 253 10*3/uL (ref 150.0–400.0)
RBC: 4.5 Mil/uL (ref 3.87–5.11)
RDW: 13.4 % (ref 11.5–15.5)
WBC: 5.8 10*3/uL (ref 4.0–10.5)

## 2021-06-01 LAB — TSH: TSH: 0.95 u[IU]/mL (ref 0.35–5.50)

## 2021-06-01 LAB — HEMOGLOBIN A1C: Hgb A1c MFr Bld: 6.2 % (ref 4.6–6.5)

## 2021-06-07 ENCOUNTER — Ambulatory Visit (INDEPENDENT_AMBULATORY_CARE_PROVIDER_SITE_OTHER): Payer: Medicare PPO

## 2021-06-07 VITALS — Ht 69.0 in | Wt 164.0 lb

## 2021-06-07 DIAGNOSIS — Z0000 Encounter for general adult medical examination without abnormal findings: Secondary | ICD-10-CM | POA: Diagnosis not present

## 2021-06-07 NOTE — Progress Notes (Signed)
Subjective:   Shannon [name] is a 71 y.o. female who presents for Medicare Annual (Subsequent) preventive examination.  Review of Systems    Virtual Visit via Telephone Note  I connected with  Shannon [name] on 06/07/21 at  1:00 PM EDT by telephone and verified that I am speaking with the correct person using two identifiers.  Location: Patient: Home Provider: Office Persons participating in the virtual visit: patient/Nurse Health Advisor   I discussed the limitations, risks, security and privacy concerns of performing an evaluation and management service by telephone and the availability of in person appointments. The patient expressed understanding and agreed to proceed.  Interactive audio and video telecommunications were attempted between this nurse and patient, however failed, due to patient having technical difficulties OR patient did not have access to video capability.  We continued and completed visit with audio only.  Some vital signs may be absent or patient reported.   Criselda Peaches, LPN  Cardiac Risk Factors include: advanced age (>33mn, >>10women)     Objective:    Today's Vitals   06/07/21 1259  Weight: 164 lb (74.4 kg)  Height: '5\' 9"'$  (1.753 m)   Body mass index is 24.22 kg/m.     06/07/2021    1:07 PM 12/25/2020    9:34 AM 09/08/2019    9:49 AM 04/28/2019    9:45 AM 12/30/2018   10:17 AM 08/12/2018   11:31 AM 02/03/2018    3:22 PM  Advanced Directives  Does Patient Have a Medical Advance Directive? Yes Yes Yes Yes Yes Yes Yes  Type of AParamed[place]of [place] will Living will Living will [place] ErieLiving will [place] will [place] will Living will  Does patient want to make changes to medical advance directive? No - Patient declined No - Patient declined    No - Patient declined Yes (Inpatient - patient requests chaplain consult to change a  medical advance directive)  Copy of [place] Chart? Yes - validated most recent copy scanned in chart (See row information)   Yes - validated most recent copy scanned in chart (See row information) No - copy requested Yes - validated most recent copy scanned in chart (See row information) No - copy requested    Current Medications (verified) Outpatient Encounter Medications as of 06/07/2021  Medication Sig   famotidine (PEPCID) 20 MG tablet Take 20 mg by mouth daily as needed for heartburn or indigestion.   MAGNESIUM GLUCONATE PO Take by mouth daily.   Melatonin 3 MG TABS Take by mouth at bedtime as needed.   mirabegron ER (MYRBETRIQ) 50 MG TB24 tablet Take 1 tablet (50 mg total) by mouth daily.   Misc Natural Products (PUMPKIN SEED OIL PO) Take 1,000 mg by mouth in the morning and at bedtime.   Multiple Vitamin (MULTIVITAMIN) tablet Take 1 tablet by mouth daily.   St Johns Wort 300 MG TABS Take by mouth.   No facility-administered encounter medications on file as of 06/07/2021.    Allergies (verified) Adhesive [tape], Alpha-gal, and Neomycin-bacitracin zn-polymyx   History: Past Medical History:  Diagnosis Date   Arthritis    left foot   Blood transfusion without reported diagnosis    Chronic neck and back pain    received physical therapy   Dysrhythmia    Pt told she had "benign arrythmia" after wearing monitor   GERD (gastroesophageal reflux disease)  with esophagitis   Motion sickness    boats, planes   Numbness    Right outer thigh, constant, notices more with standing for a long period of time   Plantar fasciitis    left   Past Surgical History:  Procedure Laterality Date   ARTHRODESIS METATARSAL Left 12/30/2018   Procedure: ARTHRODESIS,LISFRANC;MULTIPLE LEFT;  Surgeon: Samara Deist, DPM;  Location: [facility];  Service: Podiatry;  Laterality: Left;  general with local   BLADDER SUSPENSION N/A 07/04/2015   Procedure: TRANSVAGINAL TAPE  (TVT) SLING                   ;  Surgeon: Emily Filbert, MD;  Location: [facility];  Service: Gynecology;  Laterality: N/A;   COLONOSCOPY     COLONOSCOPY WITH PROPOFOL N/A 09/08/2019   Procedure: COLONOSCOPY WITH PROPOFOL;  Surgeon: Virgel Manifold, MD;  Location: [facility];  Service: Endoscopy;  Laterality: N/A;   CYSTOCELE REPAIR N/A 07/04/2015   Procedure: ANTERIOR REPAIR (CYSTOCELE);  Surgeon: Emily Filbert, MD;  Location: [facility];  Service: Gynecology;  Laterality: N/A;   CYSTOSCOPY N/A 07/04/2015   Procedure: CYSTOSCOPY;  Surgeon: Emily Filbert, MD;  Location: [facility];  Service: Gynecology;  Laterality: N/A;   DILATION AND CURETTAGE OF UTERUS     EXCISION MORTON'S NEUROMA Left 08/12/2018   Procedure: EXCISION MORTON'S NEUROMA;  Surgeon: Samara Deist, DPM;  Location: [facility];  Service: Podiatry;  Laterality: Left;  lma local   POLYPECTOMY     TUBAL LIGATION  1987   UPPER GI ENDOSCOPY     VAGINAL HYSTERECTOMY N/A 07/04/2015   Procedure: TOTAL VAGINAL HYSTERECTOMY  ;  Surgeon: Emily Filbert, MD;  Location: [facility];  Service: Gynecology;  Laterality: N/A;   Family History  Problem Relation Age of Onset   Stroke Mother        x 2   Heart disease Mother        congenital arrhythmia and CHF   Depression Mother    Atrial fibrillation Mother    Stomach cancer Paternal Uncle    Prostate cancer Brother    Atrial fibrillation Brother    Colon cancer Neg Hx    Esophageal cancer Neg Hx    Kidney cancer Neg Hx    Bladder Cancer Neg Hx    Breast cancer Neg Hx    Social History   Socioeconomic History   Marital status: Married    Spouse name: Not on file   Number of children: Not on file   Years of education: Not on file   Highest education level: Not on file  Occupational History   Not on file  Tobacco Use   Smoking status: Never   Smokeless tobacco: Never  Vaping Use   Vaping Use: Never used  Substance and Sexual Activity   Alcohol use: No    Alcohol/week: 0.0 standard  drinks    Comment: rare   Drug use: No   Sexual activity: Yes    Birth control/protection: Post-menopausal, Surgical  Other Topics Concern   Not on file  Social History Narrative   Not on file   Social Determinants of Health   Financial Resource Strain: Low Risk    Difficulty of Paying Living Expenses: Not hard at all  Food Insecurity: No Food Insecurity   Worried About Charity fundraiser in the Last Year: Never true   Ran Out of Food in the Last Year: Never true  Transportation  Needs: No Transportation Needs   Lack of Transportation (Medical): No   Lack of Transportation (Non-Medical): No  Physical Activity: Inactive   Days of Exercise per Week: 0 days   Minutes of Exercise per Session: 0 min  Stress: No Stress Concern Present   Feeling of Stress : Not at all  Social Connections: Moderately Isolated   Frequency of Communication with Friends and Family: More than three times a week   Frequency of Social Gatherings with Friends and Family: More than three times a week   Attends Religious Services: Never   Marine scientist or Organizations: No   Attends Archivist Meetings: Never   Marital Status: Married     Clinical Intake: How often do you need to have someone help you when you read instructions, pamphlets, or other written materials from your doctor or pharmacy?: 1 - Never  Diabetic?  No  Interpreter Needed?: No Activities of Daily Living    06/07/2021    1:05 PM  In your present state of health, do you have any difficulty performing the following activities:  Hearing? 0  Vision? 0  Difficulty concentrating or making decisions? 0  Walking or climbing stairs? 1  Comment Due knee pain. Followed bt [facility]  Dressing or bathing? 0  Doing errands, shopping? 0  Preparing Food and eating ? N  Using the Toilet? N  In the past six months, have you accidently leaked urine? N  Do you have problems with loss of bowel control? N  Managing your  Medications? N  Managing your Finances? N  Housekeeping or managing your Housekeeping? N    Patient Care Team: Tower, Wynelle Fanny, MD as PCP - General Hulan Fray, Wilhemina Cash, MD (Inactive) as Consulting Physician (Obstetrics and Gynecology)  Indicate any recent Medical Services you may have received from other than [facility] providers in the past year (date may be approximate).     Assessment:   This is a routine wellness examination for [place].  Hearing/Vision screen Hearing Screening - Comments:: No hearing difficulty Vision Screening - Comments:: Wears glasses. Followed by [facility] issues and exercise activities discussed: Exercise limited by: orthopedic condition(s)   Goals Addressed               This Visit's Progress     Increase physical activity (pt-stated)        After knee surgery.       Depression Screen    06/07/2021    1:02 PM 06/06/2020    2:35 PM 04/28/2019    9:47 AM 12/24/2017    9:22 AM 12/25/2016   11:57 AM 11/24/2015    9:25 AM 11/30/2014   11:42 AM  PHQ 2/9 Scores  PHQ - 2 Score 0 0 0 0 0 0 0  PHQ- 9 Score  2 0 0       Fall Risk    06/07/2021    1:06 PM 06/06/2020    2:24 PM 04/28/2019    9:46 AM 12/24/2017    9:22 AM 12/25/2016   11:57 AM  [place] in the past year? 0 0 0 0 No  Number falls in past yr: 0 0 0    Injury with Fall? 0 0 0    Risk for fall due to : No Fall Risks  No Fall Risks    Follow up   Falls prevention discussed;Falls evaluation completed      FALL RISK PREVENTION PERTAINING  TO THE HOME:  Any stairs in or around the home? Yes  If so, are there any without handrails? No  Home free of loose throw rugs in walkways, pet beds, electrical cords, etc? Yes  Adequate lighting in your home to reduce risk of falls? Yes   ASSISTIVE DEVICES UTILIZED TO PREVENT FALLS:  Life alert? No  Use of a cane, walker or w/c? No  Grab bars in the bathroom? Yes  Shower chair or bench in shower? Yes  Elevated toilet seat or a  handicapped toilet? Yes   TIMED UP AND GO:  Was the test performed? No . Audio Visit  Cognitive Function:    04/28/2019    9:48 AM 12/24/2017    9:23 AM 12/16/2016    1:36 PM 11/24/2015    9:26 AM  MMSE - Mini Mental State Exam  Orientation to time '5 5 5 5  '$ Orientation to Place '5 5 5 5  '$ Registration '3 3 3 3  '$ Attention/ Calculation 5 0 0 0  Recall '3 3 3 3  '$ Language- name 2 objects  0 0 0  Language- repeat '1 1 1 1  '$ Language- follow 3 step command  '3 3 3  '$ Language- read & follow direction  0 0 0  Write a sentence  0 0 0  Copy design  0 0 0  Total score  '20 20 20        '$ 06/07/2021    1:07 PM  6CIT Screen  What Year? 0 points  What month? 0 points  What time? 0 points  Count back from 20 0 points  Months in reverse 0 points  Repeat phrase 0 points  Total Score 0 points    Immunizations Immunization History  Administered Date(s) Administered   Influenza Whole 01/21/2005, 11/10/2006   Influenza, High Dose Seasonal PF 10/25/2016, 09/30/2017, 09/10/2018   Influenza-Unspecified 10/31/2012, 11/21/2014, 10/05/2015   PFIZER(Purple Top)SARS-COV-2 Vaccination 02/27/2019, 03/24/2019, 10/21/2019   Pneumococcal Conjugate-13 11/30/2014   Pneumococcal Polysaccharide-23 11/17/2013, 09/10/2018   Td 10/15/2001, 02/09/2010   Zoster Recombinat (Shingrix) 05/03/2016   Zoster, Live 09/04/2010    TDAP status: Due, Education has been provided regarding the importance of this vaccine. Advised may receive this vaccine at local pharmacy or Health Dept. Aware to provide a copy of the vaccination record if obtained from local pharmacy or Health Dept. Verbalized acceptance and understanding.    Pneumococcal vaccine status: Up to date  Covid-19 vaccine status: Completed vaccines  Qualifies for Shingles Vaccine? Yes   Zostavax completed Yes   Shingrix Completed?: Yes  Screening Tests Health Maintenance  Topic Date Due   TETANUS/TDAP  06/08/2022 (Originally 02/10/2020)   INFLUENZA VACCINE   08/21/2021   MAMMOGRAM  05/22/2022   COLONOSCOPY (Pts 45-20yr Insurance coverage will need to be confirmed)  09/08/2022   Pneumonia Vaccine 71 Years old  Completed   DEXA SCAN  Completed   Hepatitis C Screening  Completed   HPV VACCINES  Aged Out   COVID-19 Vaccine  Discontinued   Zoster Vaccines- Shingrix  Discontinued    Health Maintenance  There are no preventive care reminders to display for this patient.   Colorectal cancer screening: Type of screening: Colonoscopy. Completed 09/08/19. Repeat every 3 years  Mammogram status: Completed 05/21/20. Repeat every year  Bone Density status: Completed 12/18/15. Results reflect: Bone density results: OSTEOPOROSIS. Repeat every 2 years.  Lung Cancer Screening: (Low Dose CT Chest recommended if Age 73-80years, 30 pack-year currently smoking OR have quit w/in  15years.) does not qualify.     Additional Screening:  Hepatitis C Screening: does qualify; Completed 11/24/15  Vision Screening: Recommended annual ophthalmology exams for early detection of glaucoma and other disorders of the eye. Is the patient up to date with their annual eye exam?  Yes  Who is the provider or what is the name of the office in which the patient attends annual eye exams? [facility] If pt is not established with a provider, would they like to be referred to a provider to establish care? No .   Dental Screening: Recommended annual dental exams for proper oral hygiene  Community Resource Referral / Chronic Care Management:  CRR required this visit?  No   CCM required this visit?  No      Plan:     I have personally reviewed and noted the following in the patient's chart:   Medical and social history Use of alcohol, tobacco or illicit drugs  Current medications and supplements including opioid prescriptions.  Functional ability and status Nutritional status Physical activity Advanced directives List of other physicians Hospitalizations,  surgeries, and ER visits in previous 12 months Vitals Screenings to include cognitive, depression, and falls Referrals and appointments  In addition, I have reviewed and discussed with patient certain preventive protocols, quality metrics, and best practice recommendations. A written personalized care plan for preventive services as well as general preventive health recommendations were provided to patient.     Criselda Peaches, LPN   [id number]/[id number]   Nurse Notes: Patient request f/u with concerns and questions about pain management before knee surgery on 08/13/21.

## 2021-06-07 NOTE — Patient Instructions (Addendum)
Shannon [name] , Thank you for taking time to come for your Medicare Wellness Visit. I appreciate your ongoing commitment to your health goals. Please review the following plan we discussed and let me know if I can assist you in the future.   These are the goals we discussed:  Goals       Increase physical activity      Starting 12/24/2017, I will continue to exercise with personal trainer for 30 minutes twice weekly and to walk for 1/2-1 mile twice weekly.        Increase physical activity (pt-stated)      After knee surgery.      Patient Stated      04/28/2019, I will continue to walk 2 miles everyday.         This is a list of the screening recommended for you and due dates:  Health Maintenance  Topic Date Due   Tetanus Vaccine  06/08/2022*   Flu Shot  08/21/2021   Mammogram  05/22/2022   Colon Cancer Screening  09/08/2022   Pneumonia Vaccine  Completed   DEXA scan (bone density measurement)  Completed   Hepatitis C Screening: USPSTF Recommendation to screen - Ages 30-79 yo.  Completed   HPV Vaccine  Aged Out   COVID-19 Vaccine  Discontinued   Zoster (Shingles) Vaccine  Discontinued  *Topic was postponed. The date shown is not the original due date.    Advanced directives: Yes Copies on file  Conditions/risks identified: None  Next appointment: Follow up in one year for your annual wellness visit     Preventive Care 65 Years and Older, Female Preventive care refers to lifestyle choices and visits with your health care provider that can promote health and wellness. What does preventive care include? A yearly physical exam. This is also called an annual well check. Dental exams once or twice a year. Routine eye exams. Ask your health care provider how often you should have your eyes checked. Personal lifestyle choices, including: Daily care of your teeth and gums. Regular physical activity. Eating a healthy diet. Avoiding tobacco and drug use. Limiting alcohol  use. Practicing safe sex. Taking low-dose aspirin every day. Taking vitamin and mineral supplements as recommended by your health care provider. What happens during an annual well check? The services and screenings done by your health care provider during your annual well check will depend on your age, overall health, lifestyle risk factors, and family history of disease. Counseling  Your health care provider may ask you questions about your: Alcohol use. Tobacco use. Drug use. Emotional well-being. Home and relationship well-being. Sexual activity. Eating habits. History of falls. Memory and ability to understand (cognition). Work and work Statistician. Reproductive health. Screening  You may have the following tests or measurements: Height, weight, and BMI. Blood pressure. Lipid and cholesterol levels. These may be checked every 5 years, or more frequently if you are over 87 years old. Skin check. Lung cancer screening. You may have this screening every year starting at age 61 if you have a 30-pack-year history of smoking and currently smoke or have quit within the past 15 years. Fecal occult blood test (FOBT) of the stool. You may have this test every year starting at age 65. Flexible sigmoidoscopy or colonoscopy. You may have a sigmoidoscopy every 5 years or a colonoscopy every 10 years starting at age 51. Hepatitis C blood test. Hepatitis B blood test. Sexually transmitted disease (STD) testing. Diabetes screening. This is done by  checking your blood sugar (glucose) after you have not eaten for a while (fasting). You may have this done every 1-3 years. Bone density scan. This is done to screen for osteoporosis. You may have this done starting at age 34. Mammogram. This may be done every 1-2 years. Talk to your health care provider about how often you should have regular mammograms. Talk with your health care provider about your test results, treatment options, and if necessary,  the need for more tests. Vaccines  Your health care provider may recommend certain vaccines, such as: Influenza vaccine. This is recommended every year. Tetanus, diphtheria, and acellular pertussis (Tdap, Td) vaccine. You may need a Td booster every 10 years. Zoster vaccine. You may need this after age 80. Pneumococcal 13-valent conjugate (PCV13) vaccine. One dose is recommended after age 98. Pneumococcal polysaccharide (PPSV23) vaccine. One dose is recommended after age 28. Talk to your health care provider about which screenings and vaccines you need and how often you need them. This information is not intended to replace advice given to you by your health care provider. Make sure you discuss any questions you have with your health care provider. Document Released: 02/03/2015 Document Revised: 09/27/2015 Document Reviewed: 11/08/2014 Elsevier Interactive Patient Education  2017 [place] Prevention in the Home Falls can cause injuries. They can happen to people of all ages. There are many things you can do to make your home safe and to help prevent falls. What can I do on the outside of my home? Regularly fix the edges of walkways and driveways and fix any cracks. Remove anything that might make you trip as you walk through a door, such as a raised step or threshold. Trim any bushes or trees on the path to your home. Use bright outdoor lighting. Clear any walking paths of anything that might make someone trip, such as rocks or tools. Regularly check to see if handrails are loose or broken. Make sure that both sides of any steps have handrails. Any raised decks and porches should have guardrails on the edges. Have any leaves, snow, or ice cleared regularly. Use sand or salt on walking paths during winter. Clean up any spills in your garage right away. This includes oil or grease spills. What can I do in the bathroom? Use night lights. Install grab bars by the toilet and in the  tub and shower. Do not use towel bars as grab bars. Use non-skid mats or decals in the tub or shower. If you need to sit down in the shower, use a plastic, non-slip stool. Keep the floor dry. Clean up any water that spills on the floor as soon as it happens. Remove soap buildup in the tub or shower regularly. Attach bath mats securely with double-sided non-slip rug tape. Do not have throw rugs and other things on the floor that can make you trip. What can I do in the bedroom? Use night lights. Make sure that you have a light by your bed that is easy to reach. Do not use any sheets or blankets that are too big for your bed. They should not hang down onto the floor. Have a firm chair that has side arms. You can use this for support while you get dressed. Do not have throw rugs and other things on the floor that can make you trip. What can I do in the kitchen? Clean up any spills right away. Avoid walking on wet floors. Keep items that you use a  lot in easy-to-reach places. If you need to reach something above you, use a strong step stool that has a grab bar. Keep electrical cords out of the way. Do not use floor polish or wax that makes floors slippery. If you must use wax, use non-skid floor wax. Do not have throw rugs and other things on the floor that can make you trip. What can I do with my stairs? Do not leave any items on the stairs. Make sure that there are handrails on both sides of the stairs and use them. Fix handrails that are broken or loose. Make sure that handrails are as long as the stairways. Check any carpeting to make sure that it is firmly attached to the stairs. Fix any carpet that is loose or worn. Avoid having throw rugs at the top or bottom of the stairs. If you do have throw rugs, attach them to the floor with carpet tape. Make sure that you have a light switch at the top of the stairs and the bottom of the stairs. If you do not have them, ask someone to add them for  you. What else can I do to help prevent falls? Wear shoes that: Do not have high heels. Have rubber bottoms. Are comfortable and fit you well. Are closed at the toe. Do not wear sandals. If you use a stepladder: Make sure that it is fully opened. Do not climb a closed stepladder. Make sure that both sides of the stepladder are locked into place. Ask someone to hold it for you, if possible. Clearly mark and make sure that you can see: Any grab bars or handrails. First and last steps. Where the edge of each step is. Use tools that help you move around (mobility aids) if they are needed. These include: Canes. Walkers. Scooters. Crutches. Turn on the lights when you go into a dark area. Replace any light bulbs as soon as they burn out. Set up your furniture so you have a clear path. Avoid moving your furniture around. If any of your floors are uneven, fix them. If there are any pets around you, be aware of where they are. Review your medicines with your doctor. Some medicines can make you feel dizzy. This can increase your chance of falling. Ask your doctor what other things that you can do to help prevent falls. This information is not intended to replace advice given to you by your health care provider. Make sure you discuss any questions you have with your health care provider. Document Released: 11/03/2008 Document Revised: 06/15/2015 Document Reviewed: 02/11/2014 Elsevier Interactive Patient Education  2017 [employer].

## 2021-06-08 ENCOUNTER — Ambulatory Visit (INDEPENDENT_AMBULATORY_CARE_PROVIDER_SITE_OTHER): Payer: Medicare PPO | Admitting: Family Medicine

## 2021-06-08 ENCOUNTER — Encounter: Payer: Self-pay | Admitting: Family Medicine

## 2021-06-08 VITALS — BP 108/66 | HR 78 | Temp 97.2°F | Ht 68.5 in | Wt 169.2 lb

## 2021-06-08 DIAGNOSIS — E2839 Other primary ovarian failure: Secondary | ICD-10-CM

## 2021-06-08 DIAGNOSIS — Z0000 Encounter for general adult medical examination without abnormal findings: Secondary | ICD-10-CM | POA: Diagnosis not present

## 2021-06-08 DIAGNOSIS — Z01810 Encounter for preprocedural cardiovascular examination: Secondary | ICD-10-CM | POA: Diagnosis not present

## 2021-06-08 DIAGNOSIS — R739 Hyperglycemia, unspecified: Secondary | ICD-10-CM | POA: Diagnosis not present

## 2021-06-08 DIAGNOSIS — K219 Gastro-esophageal reflux disease without esophagitis: Secondary | ICD-10-CM

## 2021-06-08 NOTE — Progress Notes (Signed)
Subjective:    Patient ID: Shannon [name], female    DOB: July 06, 1950, 71 y.o.   MRN: [id number]  HPI Here for health maintenance exam and to review chronic medical problems  Also surg clearance   Wt Readings from Last 3 Encounters:  06/08/21 169 lb 4 oz (76.8 kg)  06/07/21 164 lb (74.4 kg)  03/28/21 164 lb (74.4 kg)   25.36 kg/m  Getting ready for right TKA in July  Really limited by the knee  Does not use a cane at this point/ makes other side hurt  Dr Vennie Homans what anesthesia   No cardiac or pulm problems   EKG NSR with rate of 71  Allergic to adhesive tape (? Once) unsure if it was an issue  Has had alpha gal   No problems with anesthesia at all    Amw reviewed   Immunization History  Administered Date(s) Administered   Influenza Whole 01/21/2005, 11/10/2006   Influenza, High Dose Seasonal PF 10/25/2016, 09/30/2017, 09/10/2018   Influenza-Unspecified 10/31/2012, 11/21/2014, 10/05/2015   PFIZER(Purple Top)SARS-COV-2 Vaccination 02/27/2019, 03/24/2019, 10/21/2019   Pneumococcal Conjugate-13 11/30/2014   Pneumococcal Polysaccharide-23 11/17/2013, 09/10/2018   Td 10/15/2001, 02/09/2010   Zoster Recombinat (Shingrix) 05/03/2016   Zoster, Live 09/04/2010    Mammogram 05/2021 Self breast exam: no lumps or changes   Gyn problems : none   Has not had uti in a year    Colonoscopy 08/2019 Due for 3 y recall in 2024  Dexa 11/17 normal BMD  Falls: none  Fractures:none  Supplements : taking vitamin D and mvi Exercise :  none due to severe knee pain /cannot walk   BP Readings from Last 3 Encounters:  06/08/21 108/66  12/25/20 107/65  11/08/20 118/79   Pulse Readings from Last 3 Encounters:  06/08/21 78  12/25/20 72  11/08/20 69   Cholesterol Lab Results  Component Value Date   CHOL 169 06/01/2021   CHOL 176 06/06/2020   CHOL 164 04/26/2019   Lab Results  Component Value Date   HDL 79.90 06/01/2021   HDL 73.40 06/06/2020   HDL 76.70  04/26/2019   Lab Results  Component Value Date   LDLCALC 79 06/01/2021   LDLCALC 76 06/06/2020   LDLCALC 79 04/26/2019   Lab Results  Component Value Date   TRIG 47.0 06/01/2021   TRIG 132.0 06/06/2020   TRIG 43.0 04/26/2019   Lab Results  Component Value Date   CHOLHDL 2 06/01/2021   CHOLHDL 2 06/06/2020   CHOLHDL 2 04/26/2019   No results found for: LDLDIRECT Is eating healthy   Other labs Lab Results  Component Value Date   CREATININE 0.74 06/01/2021   BUN 24 (H) 06/01/2021   NA 140 06/01/2021   K 4.3 06/01/2021   CL 103 06/01/2021   CO2 30 06/01/2021   Glucose 104 Lab Results  Component Value Date   HGBA1C 6.2 06/01/2021  Down from 6.3   Mindful of sugar in diet    Lab Results  Component Value Date   ALT 14 06/01/2021   AST 17 06/01/2021   ALKPHOS 78 06/01/2021   BILITOT 0.6 06/01/2021   Lab Results  Component Value Date   WBC 5.8 06/01/2021   HGB 13.6 06/01/2021   HCT 40.7 06/01/2021   MCV 90.5 06/01/2021   PLT 253.0 06/01/2021   Lab Results  Component Value Date   TSH 0.95 06/01/2021    Patient Active Problem List   Diagnosis Date Noted  Pre-operative cardiovascular examination 06/10/2021   Pre-op examination 05/31/2021   Primary osteoarthritis of right knee 03/28/2021   Polyp of colon    History of PSVT (paroxysmal supraventricular tachycardia) 06/17/2017   Recurrent UTI 08/23/2016   Estrogen deficiency 12/06/2015   Elevated blood sugar 12/06/2015   Incontinence of urine in female 04/12/2015   Medicare annual wellness visit, subsequent 11/30/2014   Routine general medical examination at a health care facility 11/05/2012   PLANTAR FASCIITIS, LEFT 07/28/2009   PERSONAL HX COLONIC POLYPS 11/02/2007   GERD 10/20/2006   Past Medical History:  Diagnosis Date   Arthritis    left foot   Blood transfusion without reported diagnosis    Chronic neck and back pain    received physical therapy   Dysrhythmia    Pt told she had "benign  arrythmia" after wearing monitor   GERD (gastroesophageal reflux disease)    with esophagitis   Motion sickness    boats, planes   Numbness    Right outer thigh, constant, notices more with standing for a long period of time   Plantar fasciitis    left   Past Surgical History:  Procedure Laterality Date   ARTHRODESIS METATARSAL Left 12/30/2018   Procedure: ARTHRODESIS,LISFRANC;MULTIPLE LEFT;  Surgeon: Samara Deist, DPM;  Location: [facility];  Service: Podiatry;  Laterality: Left;  general with local   BLADDER SUSPENSION N/A 07/04/2015   Procedure: TRANSVAGINAL TAPE (TVT) SLING                   ;  Surgeon: Emily Filbert, MD;  Location: [facility];  Service: Gynecology;  Laterality: N/A;   COLONOSCOPY     COLONOSCOPY WITH PROPOFOL N/A 09/08/2019   Procedure: COLONOSCOPY WITH PROPOFOL;  Surgeon: Virgel Manifold, MD;  Location: [facility];  Service: Endoscopy;  Laterality: N/A;   CYSTOCELE REPAIR N/A 07/04/2015   Procedure: ANTERIOR REPAIR (CYSTOCELE);  Surgeon: Emily Filbert, MD;  Location: [facility];  Service: Gynecology;  Laterality: N/A;   CYSTOSCOPY N/A 07/04/2015   Procedure: CYSTOSCOPY;  Surgeon: Emily Filbert, MD;  Location: [facility];  Service: Gynecology;  Laterality: N/A;   DILATION AND CURETTAGE OF UTERUS     EXCISION MORTON'S NEUROMA Left 08/12/2018   Procedure: EXCISION MORTON'S NEUROMA;  Surgeon: Samara Deist, DPM;  Location: [facility];  Service: Podiatry;  Laterality: Left;  lma local   POLYPECTOMY     TUBAL LIGATION  1987   UPPER GI ENDOSCOPY     VAGINAL HYSTERECTOMY N/A 07/04/2015   Procedure: TOTAL VAGINAL HYSTERECTOMY  ;  Surgeon: Emily Filbert, MD;  Location: [facility];  Service: Gynecology;  Laterality: N/A;   Social History   Tobacco Use   Smoking status: Never   Smokeless tobacco: Never  Vaping Use   Vaping Use: Never used  Substance Use Topics   Alcohol use: No    Alcohol/week: 0.0 standard drinks    Comment: rare   Drug use: No   Family History   Problem Relation Age of Onset   Stroke Mother        x 2   Heart disease Mother        congenital arrhythmia and CHF   Depression Mother    Atrial fibrillation Mother    Stomach cancer Paternal Uncle    Prostate cancer Brother    Atrial fibrillation Brother    Colon cancer Neg Hx    Esophageal cancer Neg Hx    Kidney cancer Neg  Hx    Bladder Cancer Neg Hx    Breast cancer Neg Hx    Allergies  Allergen Reactions   Adhesive [Tape] Rash    (not sure if rash was from ointment or tape)   Alpha-Gal Rash    (red meat S/P tick bite)   Neomycin-Bacitracin Zn-Polymyx Rash    Ointment   Current Outpatient Medications on File Prior to Visit  Medication Sig Dispense Refill   famotidine (PEPCID) 20 MG tablet Take 20 mg by mouth daily as needed for heartburn or indigestion.     MAGNESIUM GLUCONATE PO Take by mouth daily.     Melatonin 3 MG TABS Take by mouth at bedtime as needed.     mirabegron ER (MYRBETRIQ) 50 MG TB24 tablet Take 1 tablet (50 mg total) by mouth daily. 90 tablet 3   Misc Natural Products (PUMPKIN SEED OIL PO) Take 1,000 mg by mouth in the morning and at bedtime.     Multiple Vitamin (MULTIVITAMIN) tablet Take 1 tablet by mouth daily.     St Johns Wort 300 MG TABS Take by mouth.     No current facility-administered medications on file prior to visit.     Review of Systems  Constitutional:  Negative for activity change, appetite change, fatigue, fever and unexpected weight change.  HENT:  Negative for congestion, ear pain, rhinorrhea, sinus pressure and sore throat.   Eyes:  Negative for pain, redness and visual disturbance.  Respiratory:  Negative for cough, shortness of breath and wheezing.   Cardiovascular:  Negative for chest pain and palpitations.  Gastrointestinal:  Negative for abdominal pain, blood in stool, constipation and diarrhea.  Endocrine: Negative for polydipsia and polyuria.  Genitourinary:  Negative for dysuria, frequency and urgency.   Musculoskeletal:  Negative for arthralgias, back pain and myalgias.       Knee pain   Skin:  Negative for pallor and rash.  Allergic/Immunologic: Negative for environmental allergies.  Neurological:  Negative for dizziness, syncope and headaches.  Hematological:  Negative for adenopathy. Does not bruise/bleed easily.  Psychiatric/Behavioral:  Negative for decreased concentration and dysphoric mood. The patient is not nervous/anxious.       Objective:   Physical Exam Constitutional:      General: She is not in acute distress.    Appearance: Normal appearance. She is well-developed and normal weight. She is not ill-appearing or diaphoretic.  HENT:     Head: Normocephalic and atraumatic.     Right Ear: Tympanic membrane, ear canal and external ear normal.     Left Ear: Tympanic membrane, ear canal and external ear normal.     Nose: Nose normal. No congestion.     Mouth/Throat:     Mouth: Mucous membranes are moist.     Pharynx: Oropharynx is clear. No posterior oropharyngeal erythema.  Eyes:     General: No scleral icterus.    Extraocular Movements: Extraocular movements intact.     Conjunctiva/sclera: Conjunctivae normal.     Pupils: Pupils are equal, round, and reactive to light.  Neck:     Thyroid: No thyromegaly.     Vascular: No carotid bruit or JVD.  Cardiovascular:     Rate and Rhythm: Normal rate and regular rhythm.     Pulses: Normal pulses.     Heart sounds: Normal heart sounds.    No gallop.  Pulmonary:     Effort: Pulmonary effort is normal. No respiratory distress.     Breath sounds: Normal breath sounds. No wheezing.  Comments: Good air exch Chest:     Chest wall: No tenderness.  Abdominal:     General: Bowel sounds are normal. There is no distension or abdominal bruit.     Palpations: Abdomen is soft. There is no mass.     Tenderness: There is no abdominal tenderness.     Hernia: No hernia is present.  Genitourinary:    Comments: Breast exam: No mass,  nodules, thickening, tenderness, bulging, retraction, inflamation, nipple discharge or skin changes noted.  No axillary or clavicular LA.     Musculoskeletal:        General: No tenderness. Normal range of motion.     Cervical back: Normal range of motion and neck supple. No rigidity. No muscular tenderness.     Right lower leg: No edema.     Left lower leg: No edema.     Comments: No kyphosis   Gait is slow from knee pain   Lymphadenopathy:     Cervical: No cervical adenopathy.  Skin:    General: Skin is warm and dry.     Coloration: Skin is not pale.     Findings: No erythema or rash.     Comments: Solar lentigines diffusely   Neurological:     Mental Status: She is alert. Mental status is at baseline.     Cranial Nerves: No cranial nerve deficit.     Motor: No abnormal muscle tone.     Coordination: Coordination normal.     Gait: Gait normal.     Deep Tendon Reflexes: Reflexes are normal and symmetric.  Psychiatric:        Mood and Affect: Mood normal.        Cognition and Memory: Cognition and memory normal.          Assessment & Plan:   Problem List Items Addressed This Visit       Other   Elevated blood sugar    A1C ordered   disc imp of low glycemic diet and wt loss to prevent DM2        Estrogen deficiency    Screening dexa is due /last one neg in 2017 She wants to wait until after recovery from surgery  Taking vit D and mvi Exercise will improve after knee replacement        Pre-operative cardiovascular examination - Primary    No cardiac or pulmonary problems Nl EKG Nl exam and vitals  No blood thinners or high risk medication  Will hold nsaid 7d prior to knee surgery  Low risk       Relevant Orders   EKG 12-Lead   Routine general medical examination at a health care facility    Reviewed health habits including diet and exercise and skin cancer prevention Reviewed appropriate screening tests for age  Also reviewed health mt list, fam hx  and immunization status , as well as social and family history   See HPI Labs reviewed  Planning dexa after knee surgery  Mammogram utd Colonoscopy due in 08/2022

## 2021-06-08 NOTE — Patient Instructions (Addendum)
Once you are recovered/ let us know when you want to do a bone density test / I will put an order in   Think about some upper body exercise until your your knee improves  Lots of videos on line-could get some resistance bands or weights   Take care of yourself  Eat low cholesterol and low sugar   Good luck with your surgery   Let us know if you need anything

## 2021-06-10 DIAGNOSIS — Z01810 Encounter for preprocedural cardiovascular examination: Secondary | ICD-10-CM | POA: Insufficient documentation

## 2021-06-10 NOTE — Assessment & Plan Note (Signed)
Screening dexa is due /last one neg in 2017 She wants to wait until after recovery from surgery  Taking vit D and mvi Exercise will improve after knee replacement

## 2021-06-10 NOTE — Assessment & Plan Note (Signed)
No cardiac or pulmonary problems Nl EKG Nl exam and vitals  No blood thinners or high risk medication  Will hold nsaid 7d prior to knee surgery  Low risk

## 2021-06-10 NOTE — Assessment & Plan Note (Signed)
Reviewed health habits including diet and exercise and skin cancer prevention Reviewed appropriate screening tests for age  Also reviewed health mt list, fam hx and immunization status , as well as social and family history   See HPI Labs reviewed  Planning dexa after knee surgery  Mammogram utd Colonoscopy due in 08/2022

## 2021-06-10 NOTE — Assessment & Plan Note (Signed)
A1C ordered °disc imp of low glycemic diet and wt loss to prevent DM2  °

## 2021-06-12 ENCOUNTER — Ambulatory Visit: Payer: Medicare PPO | Admitting: Orthopaedic Surgery

## 2021-06-12 DIAGNOSIS — M1711 Unilateral primary osteoarthritis, right knee: Secondary | ICD-10-CM | POA: Diagnosis not present

## 2021-06-12 MED ORDER — BUPIVACAINE HCL 0.5 % IJ SOLN
2.0000 mL | INTRAMUSCULAR | Status: AC | PRN
  Administered 2021-06-12: 2 mL via INTRA_ARTICULAR

## 2021-06-12 MED ORDER — METHYLPREDNISOLONE ACETATE 40 MG/ML IJ SUSP
40.0000 mg | INTRAMUSCULAR | Status: AC | PRN
  Administered 2021-06-12: 40 mg via INTRA_ARTICULAR

## 2021-06-12 MED ORDER — LIDOCAINE HCL 1 % IJ SOLN
2.0000 mL | INTRAMUSCULAR | Status: AC | PRN
  Administered 2021-06-12: 2 mL

## 2021-06-12 NOTE — Progress Notes (Signed)
Office Visit Note   Patient: Shannon [name]           Date of Birth: 14-Mar-1950           MRN: [id number] Visit Date: 06/12/2021              Requested by: Tower, Wynelle Fanny, MD [place],  [place] [id number] PCP: Abner Greenspan, MD   Assessment & Plan: Visit Diagnoses:  1. Primary osteoarthritis of right knee     Plan: Patient comes in today for cortisone injection to the right knee.  She would like to get some relief during the summer for one of her upcoming trips before the knee replacement which is scheduled for more than 2 months from now.  She tolerated the cortisone injection well today.  We look forward to treating her in the operating theater for the knee replacement.  Follow-Up Instructions: No follow-ups on file.   Orders:  Orders Placed This Encounter  Procedures   Large Joint Inj   No orders of the defined types were placed in this encounter.     Procedures: Large Joint Inj: R knee on 06/12/2021 9:42 AM Indications: pain Details: 22 G needle  Arthrogram: No  Medications: 40 mg methylPREDNISolone acetate 40 MG/ML; 2 mL lidocaine 1 %; 2 mL bupivacaine 0.5 % Consent was given by the patient. Patient was prepped and draped in the usual sterile fashion.      Clinical Data: No additional findings.   Subjective: Chief Complaint  Patient presents with   Right Knee - Pain, Follow-up    HPI  Review of Systems   Objective: Vital Signs: There were no vitals taken for this visit.  Physical Exam  Ortho Exam  Specialty Comments:  No specialty comments available.  Imaging: No results found.   PMFS History: Patient Active Problem List   Diagnosis Date Noted   Pre-operative cardiovascular examination 06/10/2021   Pre-op examination 05/31/2021   Primary osteoarthritis of right knee 03/28/2021   Polyp of colon    History of PSVT (paroxysmal supraventricular tachycardia) 06/17/2017   Recurrent UTI 08/23/2016   Estrogen  deficiency 12/06/2015   Elevated blood sugar 12/06/2015   Incontinence of urine in female 04/12/2015   Medicare annual wellness visit, subsequent 11/30/2014   Routine general medical examination at a health care facility 11/05/2012   PLANTAR FASCIITIS, LEFT 07/28/2009   PERSONAL HX COLONIC POLYPS 11/02/2007   GERD 10/20/2006   Past Medical History:  Diagnosis Date   Arthritis    left foot   Blood transfusion without reported diagnosis    Chronic neck and back pain    received physical therapy   Dysrhythmia    Pt told she had "benign arrythmia" after wearing monitor   GERD (gastroesophageal reflux disease)    with esophagitis   Motion sickness    boats, planes   Numbness    Right outer thigh, constant, notices more with standing for a long period of time   Plantar fasciitis    left    Family History  Problem Relation Age of Onset   Stroke Mother        x 2   Heart disease Mother        congenital arrhythmia and CHF   Depression Mother    Atrial fibrillation Mother    Stomach cancer Paternal Uncle    Prostate cancer Brother    Atrial fibrillation Brother    Colon cancer Neg Hx  Esophageal cancer Neg Hx    Kidney cancer Neg Hx    Bladder Cancer Neg Hx    Breast cancer Neg Hx     Past Surgical History:  Procedure Laterality Date   ARTHRODESIS METATARSAL Left 12/30/2018   Procedure: ARTHRODESIS,LISFRANC;MULTIPLE LEFT;  Surgeon: Samara Deist, DPM;  Location: [facility];  Service: Podiatry;  Laterality: Left;  general with local   BLADDER SUSPENSION N/A 07/04/2015   Procedure: TRANSVAGINAL TAPE (TVT) SLING                   ;  Surgeon: Emily Filbert, MD;  Location: [facility];  Service: Gynecology;  Laterality: N/A;   COLONOSCOPY     COLONOSCOPY WITH PROPOFOL N/A 09/08/2019   Procedure: COLONOSCOPY WITH PROPOFOL;  Surgeon: Virgel Manifold, MD;  Location: [facility];  Service: Endoscopy;  Laterality: N/A;   CYSTOCELE REPAIR N/A 07/04/2015   Procedure:  ANTERIOR REPAIR (CYSTOCELE);  Surgeon: Emily Filbert, MD;  Location: [facility];  Service: Gynecology;  Laterality: N/A;   CYSTOSCOPY N/A 07/04/2015   Procedure: CYSTOSCOPY;  Surgeon: Emily Filbert, MD;  Location: [facility];  Service: Gynecology;  Laterality: N/A;   DILATION AND CURETTAGE OF UTERUS     EXCISION MORTON'S NEUROMA Left 08/12/2018   Procedure: EXCISION MORTON'S NEUROMA;  Surgeon: Samara Deist, DPM;  Location: [facility];  Service: Podiatry;  Laterality: Left;  lma local   POLYPECTOMY     TUBAL LIGATION  1987   UPPER GI ENDOSCOPY     VAGINAL HYSTERECTOMY N/A 07/04/2015   Procedure: TOTAL VAGINAL HYSTERECTOMY  ;  Surgeon: Emily Filbert, MD;  Location: [facility];  Service: Gynecology;  Laterality: N/A;   Social History   Occupational History   Not on file  Tobacco Use   Smoking status: Never   Smokeless tobacco: Never  Vaping Use   Vaping Use: Never used  Substance and Sexual Activity   Alcohol use: No    Alcohol/week: 0.0 standard drinks    Comment: rare   Drug use: No   Sexual activity: Yes    Birth control/protection: Post-menopausal, Surgical

## 2021-06-27 ENCOUNTER — Ambulatory Visit: Payer: Medicare PPO | Admitting: Family Medicine

## 2021-07-25 ENCOUNTER — Encounter: Payer: Self-pay | Admitting: Orthopaedic Surgery

## 2021-07-25 ENCOUNTER — Encounter: Payer: Self-pay | Admitting: Family Medicine

## 2021-07-25 ENCOUNTER — Ambulatory Visit (INDEPENDENT_AMBULATORY_CARE_PROVIDER_SITE_OTHER): Payer: Medicare PPO | Admitting: Family Medicine

## 2021-07-25 DIAGNOSIS — R32 Unspecified urinary incontinence: Secondary | ICD-10-CM | POA: Diagnosis not present

## 2021-07-25 MED ORDER — MIRABEGRON ER 50 MG PO TB24: 50.0000 mg | ORAL_TABLET | Freq: Every day | ORAL | 3 refills | Status: DC

## 2021-07-25 NOTE — Progress Notes (Signed)
Subjective:     Shannon [name] is a 71 y.o. female and is here for a comprehensive physical exam. The patient reports no problems.  Patient is status post TVH and TVT in 2017.  She has recently stopped her Premarin cream that she was using a with her pessary placement.  She currently uses Myrbetriq which works partially but not as well as she would like that she does need a refill of this.  Patient no longer requires annual pelvic exams.  She is status post mammogram and CBE by her primary care physician earlier this year.  The following portions of the patient's history were reviewed and updated as appropriate: allergies, current medications, past family history, past medical history, past social history, past surgical history, and problem list.  Review of Systems Pertinent items noted in HPI and remainder of comprehensive ROS otherwise negative.   Objective:    BP 125/80   Pulse 62   Wt 168 lb 6.4 oz (76.4 kg)   BMI 25.23 kg/m  General appearance: alert, cooperative, and appears stated age Head: Normocephalic, without obvious abnormality, atraumatic Neck: thyroid not enlarged, symmetric, no tenderness/mass/nodules Lungs:  normal effort Heart: regular rate and rhythm Abdomen: soft, non-tender; bowel sounds normal; no masses,  no organomegaly Extremities: extremities normal, atraumatic, no cyanosis or edema Skin: Skin color, texture, turgor normal. No rashes or lesions Neurologic: Grossly normal    Assessment:    GYN female exam.      Plan:   Problem List Items Addressed This Visit       Unprioritized   Incontinence of urine in female   Relevant Medications   mirabegron ER (MYRBETRIQ) 50 MG TB24 tablet      See After Visit Summary for Counseling Recommendations

## 2021-08-06 ENCOUNTER — Other Ambulatory Visit: Payer: Self-pay | Admitting: Physician Assistant

## 2021-08-06 DIAGNOSIS — D2261 Melanocytic nevi of right upper limb, including shoulder: Secondary | ICD-10-CM | POA: Diagnosis not present

## 2021-08-06 DIAGNOSIS — D225 Melanocytic nevi of trunk: Secondary | ICD-10-CM | POA: Diagnosis not present

## 2021-08-06 DIAGNOSIS — D2272 Melanocytic nevi of left lower limb, including hip: Secondary | ICD-10-CM | POA: Diagnosis not present

## 2021-08-06 DIAGNOSIS — D2271 Melanocytic nevi of right lower limb, including hip: Secondary | ICD-10-CM | POA: Diagnosis not present

## 2021-08-06 DIAGNOSIS — L821 Other seborrheic keratosis: Secondary | ICD-10-CM | POA: Diagnosis not present

## 2021-08-06 DIAGNOSIS — D2262 Melanocytic nevi of left upper limb, including shoulder: Secondary | ICD-10-CM | POA: Diagnosis not present

## 2021-08-06 MED ORDER — OXYCODONE-ACETAMINOPHEN 5-325 MG PO TABS: 1.0000 | ORAL_TABLET | Freq: Four times a day (QID) | ORAL | 0 refills | Status: DC | PRN

## 2021-08-06 MED ORDER — DOCUSATE SODIUM 100 MG PO CAPS: 100.0000 mg | ORAL_CAPSULE | Freq: Every day | ORAL | 2 refills | Status: AC | PRN

## 2021-08-06 MED ORDER — ONDANSETRON HCL 4 MG PO TABS: 4.0000 mg | ORAL_TABLET | Freq: Three times a day (TID) | ORAL | 0 refills | Status: DC | PRN

## 2021-08-06 MED ORDER — ASPIRIN 81 MG PO TBEC: 81.0000 mg | DELAYED_RELEASE_TABLET | Freq: Two times a day (BID) | ORAL | 0 refills | Status: DC

## 2021-08-06 MED ORDER — METHOCARBAMOL 750 MG PO TABS: 750.0000 mg | ORAL_TABLET | Freq: Two times a day (BID) | ORAL | 2 refills | Status: DC | PRN

## 2021-08-06 NOTE — Pre-Procedure Instructions (Signed)
Surgical Instructions    Your procedure is scheduled on Monday, July 24th.  Report to [facility] Main Entrance "A" at 5:30 A.M., then check in with the Admitting office.  Call this number if you have problems the morning of surgery:  [phone]   If you have any questions prior to your surgery date call [phone]: Open Monday-Friday 8am-4pm    Remember:  Do not eat after midnight the night before your surgery  You may drink clear liquids until 4:30 a.m. the morning of your surgery.   Clear liquids allowed are: Water, Non-Citrus Juices (without pulp), Carbonated Beverages, Clear Tea, Black Coffee Only (NO MILK, CREAM OR POWDERED CREAMER of any kind), and Gatorade.   Enhanced Recovery after Surgery for Orthopedics Enhanced Recovery after Surgery is a protocol used to improve the stress on your body and your recovery after surgery.  Patient Instructions  The day of surgery (if you do NOT have diabetes):  Drink ONE (1) Pre-Surgery Clear Ensure by 4:30 am the morning of surgery   This drink was given to you during your hospital  pre-op appointment visit. Nothing else to drink after completing the  Pre-Surgery Clear Ensure.         If you have questions, please contact your surgeon's office.     Take these medicines the morning of surgery with A SIP OF WATER  mirabegron ER (MYRBETRIQ) acetaminophen (TYLENOL) -as needed famotidine (PEPCID) -as needed  As of today, STOP taking any Aspirin (unless otherwise instructed by your surgeon) Aleve, Naproxen, Ibuprofen, Motrin, Advil, Goody's, BC's, all herbal medications, fish oil, and all vitamins. This includes: diclofenac Sodium (VOLTAREN) 1 % GEL.                     Do NOT Smoke (Tobacco/Vaping) for 24 hours prior to your procedure.  If you use a CPAP at night, you may bring your mask/headgear for your overnight stay.   Contacts, glasses, piercing's, hearing aid's, dentures or partials may not be worn into surgery, please bring  cases for these belongings.    For patients admitted to the hospital, discharge time will be determined by your treatment team.   Patients discharged the day of surgery will not be allowed to drive home, and someone needs to stay with them for 24 hours.  SURGICAL WAITING ROOM VISITATION Patients having surgery or a procedure may have no more than 2 support people in the waiting area - these visitors may rotate.   Children under the age of 3 must have an adult with them who is not the patient. If the patient needs to stay at the hospital during part of their recovery, the visitor guidelines for inpatient rooms apply. Pre-op nurse will coordinate an appropriate time for 1 support person to accompany patient in pre-op.  This support person may not rotate.   Please refer to the [facility] website for the visitor guidelines for Inpatients (after your surgery is over and you are in a regular room).    Special instructions:   [facility]- Preparing For Surgery  Before surgery, you can play an important role. Because skin is not sterile, your skin needs to be as free of germs as possible. You can reduce the number of germs on your skin by washing with CHG (chlorahexidine gluconate) Soap before surgery.  CHG is an antiseptic cleaner which kills germs and bonds with the skin to continue killing germs even after washing.    Oral Hygiene is also important  to reduce your risk of infection.  Remember - BRUSH YOUR TEETH THE MORNING OF SURGERY WITH YOUR REGULAR TOOTHPASTE  Please do not use if you have an allergy to CHG or antibacterial soaps. If your skin becomes reddened/irritated stop using the CHG.  Do not shave (including legs and underarms) for at least 48 hours prior to first CHG shower. It is OK to shave your face.  Please follow these instructions carefully.   Shower the NIGHT BEFORE SURGERY and the MORNING OF SURGERY  If you chose to wash your hair, wash your hair first as usual with your  normal shampoo.  After you shampoo, rinse your hair and body thoroughly to remove the shampoo.  Use CHG Soap as you would any other liquid soap. You can apply CHG directly to the skin and wash gently with a scrungie or a clean washcloth.   Apply the CHG Soap to your body ONLY FROM THE NECK DOWN.  Do not use on open wounds or open sores. Avoid contact with your eyes, ears, mouth and genitals (private parts). Wash Face and genitals (private parts)  with your normal soap.   Wash thoroughly, paying special attention to the area where your surgery will be performed.  Thoroughly rinse your body with warm water from the neck down.  DO NOT shower/wash with your normal soap after using and rinsing off the CHG Soap.  Pat yourself dry with a CLEAN TOWEL.  Wear CLEAN PAJAMAS to bed the night before surgery  Place CLEAN SHEETS on your bed the night before your surgery  DO NOT SLEEP WITH PETS.   Day of Surgery: Take a shower with CHG soap. Do not wear jewelry or makeup Do not wear lotions, powders, perfumes, or deodorant. Do not shave 48 hours prior to surgery.  Do not bring valuables to the hospital.  [facility] is not responsible for any belongings or valuables. Do not wear nail polish, gel polish, artificial nails, or any other type of covering on natural nails (fingers and toes) If you have artificial nails or gel coating that need to be removed by a nail salon, please have this removed prior to surgery. Artificial nails or gel coating may interfere with anesthesia's ability to adequately monitor your vital signs. Wear Clean/Comfortable clothing the morning of surgery Remember to brush your teeth WITH YOUR REGULAR TOOTHPASTE.   Please read over the following fact sheets that you were given.    If you received a COVID test during your pre-op visit  it is requested that you wear a mask when out in public, stay away from anyone that may not be feeling well and notify your surgeon if you  develop symptoms. If you have been in contact with anyone that has tested positive in the last 10 days please notify you surgeon.

## 2021-08-07 ENCOUNTER — Encounter (HOSPITAL_COMMUNITY)
Admission: RE | Admit: 2021-08-07 | Discharge: 2021-08-07 | Disposition: A | Discharge status: Home / Self Care | Payer: Medicare PPO | Source: Ambulatory Visit | Attending: Orthopaedic Surgery | Admitting: Orthopaedic Surgery

## 2021-08-07 ENCOUNTER — Encounter (HOSPITAL_COMMUNITY): Payer: Self-pay

## 2021-08-07 ENCOUNTER — Other Ambulatory Visit: Payer: Self-pay

## 2021-08-07 DIAGNOSIS — Z01812 Encounter for preprocedural laboratory examination: Secondary | ICD-10-CM | POA: Diagnosis not present

## 2021-08-07 DIAGNOSIS — Z01818 Encounter for other preprocedural examination: Secondary | ICD-10-CM

## 2021-08-07 DIAGNOSIS — M1711 Unilateral primary osteoarthritis, right knee: Secondary | ICD-10-CM | POA: Insufficient documentation

## 2021-08-07 HISTORY — DX: Nausea with vomiting, unspecified: R11.2

## 2021-08-07 HISTORY — DX: Other specified postprocedural states: Z98.890

## 2021-08-07 LAB — CBC
HCT: 41.1 % (ref 36.0–46.0)
Hemoglobin: 13.2 g/dL (ref 12.0–15.0)
MCH: 29.5 pg (ref 26.0–34.0)
MCHC: 32.1 g/dL (ref 30.0–36.0)
MCV: 91.9 fL (ref 80.0–100.0)
Platelets: 211 10*3/uL (ref 150–400)
RBC: 4.47 MIL/uL (ref 3.87–5.11)
RDW: 12.9 % (ref 11.5–15.5)
WBC: 4.7 10*3/uL (ref 4.0–10.5)
nRBC: 0 % (ref 0.0–0.2)

## 2021-08-07 LAB — COMPREHENSIVE METABOLIC PANEL
ALT: 28 U/L (ref 0–44)
AST: 31 U/L (ref 15–41)
Albumin: 3.4 g/dL — ABNORMAL LOW (ref 3.5–5.0)
Alkaline Phosphatase: 70 U/L (ref 38–126)
Anion gap: 7 (ref 5–15)
BUN: 19 mg/dL (ref 8–23)
CO2: 29 mmol/L (ref 22–32)
Calcium: 8.9 mg/dL (ref 8.9–10.3)
Chloride: 104 mmol/L (ref 98–111)
Creatinine, Ser: 0.77 mg/dL (ref 0.44–1.00)
GFR, Estimated: >60 mL/min (ref >60)
Glucose, Bld: 103 mg/dL — ABNORMAL HIGH (ref 70–99)
Potassium: 3.6 mmol/L (ref 3.5–5.1)
Sodium: 140 mmol/L (ref 135–145)
Total Bilirubin: 0.7 mg/dL (ref 0.3–1.2)
Total Protein: 6.3 g/dL — ABNORMAL LOW (ref 6.5–8.1)

## 2021-08-07 LAB — SURGICAL PCR SCREEN
MRSA, PCR: NEGATIVE
Staphylococcus aureus: POSITIVE — AB

## 2021-08-07 NOTE — Progress Notes (Signed)
PCP - Dr. Loura Pardon Cardiologist - denies  PPM/ICD - n/a  Chest x-ray - n/a EKG - 06/15/21 Stress Test - denies ECHO - 06/09/17 Cardiac Cath - denies  Sleep Study - denies CPAP - denies  Blood Thinner Instructions: n/a Aspirin Instructions: n/a  ERAS Protcol -Clear liquids until 0430 DOS.  PRE-SURGERY Ensure or G2- Ensure provided  COVID TEST- n/a   Anesthesia review: No  Patient denies shortness of breath, fever, cough and chest pain at PAT appointment   All instructions explained to the patient, with a verbal understanding of the material. Patient agrees to go over the instructions while at home for a better understanding. Patient also instructed to self quarantine after being tested for COVID-19. The opportunity to ask questions was provided.

## 2021-08-10 ENCOUNTER — Telehealth: Payer: Self-pay | Admitting: *Deleted

## 2021-08-10 MED ORDER — TRANEXAMIC ACID 1000 MG/10ML IV SOLN
2000.0000 mg | INTRAVENOUS | Status: DC
  Filled 2021-08-10: qty 20

## 2021-08-10 NOTE — Telephone Encounter (Signed)
Ortho bundle pre-op call completed. 

## 2021-08-10 NOTE — Care Plan (Signed)
[facility] RNCM call to patient prior to her upcoming Right total knee arthroplasty with Dr. Erlinda Hong on 08/13/21. She is an Ortho bundle patient through [facility] and is agreeable to case management. She lives with her husband, who will be assisting after discharge. She has a RW and elevated toilet seats in her home. She has had CPM already delivered by [employer] for home use. Anticipate HHPT will be needed after short hospital stay. Referral made to [facility] provided. Reviewed all post op care instructions. Will continue to follow for needs.

## 2021-08-13 ENCOUNTER — Ambulatory Visit (HOSPITAL_BASED_OUTPATIENT_CLINIC_OR_DEPARTMENT_OTHER): Payer: Medicare PPO | Admitting: Anesthesiology

## 2021-08-13 ENCOUNTER — Other Ambulatory Visit: Payer: Self-pay

## 2021-08-13 ENCOUNTER — Encounter (HOSPITAL_COMMUNITY)
Admission: RE | Disposition: A | Discharge status: Home / Self Care | Payer: Self-pay | Source: Ambulatory Visit | Attending: Orthopaedic Surgery

## 2021-08-13 ENCOUNTER — Ambulatory Visit (HOSPITAL_COMMUNITY): Payer: Medicare PPO | Admitting: Anesthesiology

## 2021-08-13 ENCOUNTER — Encounter (HOSPITAL_COMMUNITY): Payer: Self-pay | Admitting: Orthopaedic Surgery

## 2021-08-13 ENCOUNTER — Observation Stay (HOSPITAL_COMMUNITY): Payer: Medicare PPO

## 2021-08-13 ENCOUNTER — Observation Stay (HOSPITAL_COMMUNITY)
Admission: RE | Admit: 2021-08-13 | Discharge: 2021-08-14 | Disposition: A | Discharge status: Home / Self Care | Payer: Medicare PPO | Source: Ambulatory Visit | Attending: Orthopaedic Surgery | Admitting: Orthopaedic Surgery

## 2021-08-13 ENCOUNTER — Other Ambulatory Visit: Payer: Self-pay | Admitting: *Deleted

## 2021-08-13 DIAGNOSIS — M1711 Unilateral primary osteoarthritis, right knee: Principal | ICD-10-CM | POA: Insufficient documentation

## 2021-08-13 DIAGNOSIS — Z96651 Presence of right artificial knee joint: Secondary | ICD-10-CM | POA: Diagnosis not present

## 2021-08-13 DIAGNOSIS — G8918 Other acute postprocedural pain: Secondary | ICD-10-CM | POA: Diagnosis not present

## 2021-08-13 DIAGNOSIS — Z79899 Other long term (current) drug therapy: Secondary | ICD-10-CM | POA: Diagnosis not present

## 2021-08-13 DIAGNOSIS — M25561 Pain in right knee: Secondary | ICD-10-CM | POA: Diagnosis not present

## 2021-08-13 DIAGNOSIS — Z7982 Long term (current) use of aspirin: Secondary | ICD-10-CM | POA: Insufficient documentation

## 2021-08-13 HISTORY — PX: TOTAL KNEE ARTHROPLASTY: SHX125

## 2021-08-13 SURGERY — ARTHROPLASTY, KNEE, TOTAL
Anesthesia: Monitor Anesthesia Care | Site: Knee | Laterality: Right

## 2021-08-13 MED ORDER — PHENOL 1.4 % MT LIQD: 1.0000 | OROMUCOSAL | Status: DC | PRN

## 2021-08-13 MED ORDER — ONDANSETRON HCL 4 MG/2ML IJ SOLN
INTRAMUSCULAR | Status: DC | PRN
  Administered 2021-08-13: 4 mg via INTRAVENOUS

## 2021-08-13 MED ORDER — FENTANYL CITRATE (PF) 100 MCG/2ML IJ SOLN
INTRAMUSCULAR | Status: AC
  Filled 2021-08-13: qty 2

## 2021-08-13 MED ORDER — FENTANYL CITRATE (PF) 250 MCG/5ML IJ SOLN
INTRAMUSCULAR | Status: AC
  Filled 2021-08-13: qty 5

## 2021-08-13 MED ORDER — DEXAMETHASONE SODIUM PHOSPHATE 10 MG/ML IJ SOLN
10.0000 mg | Freq: Once | INTRAMUSCULAR | Status: AC
  Administered 2021-08-14: 10 mg via INTRAVENOUS
  Filled 2021-08-13: qty 1

## 2021-08-13 MED ORDER — SODIUM CHLORIDE 0.9 % IR SOLN
Status: DC | PRN
  Administered 2021-08-13: 1000 mL

## 2021-08-13 MED ORDER — METOCLOPRAMIDE HCL 5 MG/ML IJ SOLN: 5.0000 mg | Freq: Three times a day (TID) | INTRAMUSCULAR | Status: DC | PRN

## 2021-08-13 MED ORDER — PROPOFOL 10 MG/ML IV BOLUS
INTRAVENOUS | Status: AC
  Filled 2021-08-13: qty 20

## 2021-08-13 MED ORDER — PROPOFOL 500 MG/50ML IV EMUL
INTRAVENOUS | Status: DC | PRN
  Administered 2021-08-13: 125 ug/kg/min via INTRAVENOUS

## 2021-08-13 MED ORDER — KETOROLAC TROMETHAMINE 15 MG/ML IJ SOLN
INTRAMUSCULAR | Status: AC
  Filled 2021-08-13: qty 1

## 2021-08-13 MED ORDER — PHENYLEPHRINE HCL-NACL 20-0.9 MG/250ML-% IV SOLN
INTRAVENOUS | Status: DC | PRN
  Administered 2021-08-13: 10 ug/min via INTRAVENOUS

## 2021-08-13 MED ORDER — CEFAZOLIN SODIUM-DEXTROSE 2-4 GM/100ML-% IV SOLN
2.0000 g | Freq: Four times a day (QID) | INTRAVENOUS | Status: AC
  Administered 2021-08-13 (×2): 2 g via INTRAVENOUS
  Filled 2021-08-13 (×2): qty 100

## 2021-08-13 MED ORDER — 0.9 % SODIUM CHLORIDE (POUR BTL) OPTIME
TOPICAL | Status: DC | PRN
  Administered 2021-08-13: 1000 mL

## 2021-08-13 MED ORDER — ORAL CARE MOUTH RINSE: 15.0000 mL | Freq: Once | OROMUCOSAL | Status: AC

## 2021-08-13 MED ORDER — DOCUSATE SODIUM 100 MG PO CAPS
100.0000 mg | ORAL_CAPSULE | Freq: Two times a day (BID) | ORAL | Status: DC
  Administered 2021-08-13 – 2021-08-14 (×3): 100 mg via ORAL
  Filled 2021-08-13 (×3): qty 1

## 2021-08-13 MED ORDER — LACTATED RINGERS IV SOLN: INTRAVENOUS | Status: DC

## 2021-08-13 MED ORDER — ACETAMINOPHEN 10 MG/ML IV SOLN
INTRAVENOUS | Status: AC
  Administered 2021-08-13: 1000 mg
  Filled 2021-08-13: qty 100

## 2021-08-13 MED ORDER — TRANEXAMIC ACID 1000 MG/10ML IV SOLN
INTRAVENOUS | Status: DC | PRN
  Administered 2021-08-13: 2000 mg via TOPICAL

## 2021-08-13 MED ORDER — BUPIVACAINE IN DEXTROSE 0.75-8.25 % IT SOLN
INTRATHECAL | Status: DC | PRN
  Administered 2021-08-13: 1.8 mL via INTRATHECAL

## 2021-08-13 MED ORDER — ACETAMINOPHEN 500 MG PO TABS
1000.0000 mg | ORAL_TABLET | Freq: Four times a day (QID) | ORAL | Status: AC
  Administered 2021-08-13 – 2021-08-14 (×3): 1000 mg via ORAL
  Filled 2021-08-13 (×3): qty 2

## 2021-08-13 MED ORDER — POVIDONE-IODINE 10 % EX SWAB
2.0000 | Freq: Once | CUTANEOUS | Status: AC
  Administered 2021-08-13: 2 via TOPICAL

## 2021-08-13 MED ORDER — ONDANSETRON HCL 4 MG/2ML IJ SOLN
INTRAMUSCULAR | Status: AC
  Filled 2021-08-13: qty 2

## 2021-08-13 MED ORDER — OXYCODONE HCL ER 10 MG PO T12A
10.0000 mg | EXTENDED_RELEASE_TABLET | Freq: Two times a day (BID) | ORAL | Status: DC
  Administered 2021-08-13 – 2021-08-14 (×2): 10 mg via ORAL
  Filled 2021-08-13 (×2): qty 1

## 2021-08-13 MED ORDER — TRANEXAMIC ACID-NACL 1000-0.7 MG/100ML-% IV SOLN: 1000.0000 mg | Freq: Once | INTRAVENOUS | Status: DC

## 2021-08-13 MED ORDER — ONDANSETRON HCL 4 MG/2ML IJ SOLN
4.0000 mg | Freq: Four times a day (QID) | INTRAMUSCULAR | Status: DC | PRN
  Administered 2021-08-13: 4 mg via INTRAVENOUS
  Filled 2021-08-13: qty 2

## 2021-08-13 MED ORDER — FENTANYL CITRATE (PF) 250 MCG/5ML IJ SOLN
INTRAMUSCULAR | Status: DC | PRN
  Administered 2021-08-13: 25 ug via INTRAVENOUS
  Administered 2021-08-13: 50 ug via INTRAVENOUS

## 2021-08-13 MED ORDER — ASPIRIN 81 MG PO CHEW
81.0000 mg | CHEWABLE_TABLET | Freq: Two times a day (BID) | ORAL | Status: DC
  Administered 2021-08-13 – 2021-08-14 (×2): 81 mg via ORAL
  Filled 2021-08-13 (×2): qty 1

## 2021-08-13 MED ORDER — METOCLOPRAMIDE HCL 5 MG PO TABS: 5.0000 mg | ORAL_TABLET | Freq: Three times a day (TID) | ORAL | Status: DC | PRN

## 2021-08-13 MED ORDER — OXYCODONE HCL 5 MG/5ML PO SOLN: 5.0000 mg | Freq: Once | ORAL | Status: DC | PRN

## 2021-08-13 MED ORDER — OXYCODONE HCL 5 MG PO TABS: 5.0000 mg | ORAL_TABLET | Freq: Once | ORAL | Status: DC | PRN

## 2021-08-13 MED ORDER — OXYCODONE HCL 5 MG PO TABS
5.0000 mg | ORAL_TABLET | ORAL | Status: DC | PRN
  Filled 2021-08-13 (×5): qty 2

## 2021-08-13 MED ORDER — LACTATED RINGERS IV SOLN
INTRAVENOUS | Status: DC
Start: 2021-08-13 — End: 2021-08-13

## 2021-08-13 MED ORDER — BUPIVACAINE-MELOXICAM ER 400-12 MG/14ML IJ SOLN
INTRAMUSCULAR | Status: DC | PRN
  Administered 2021-08-13: 400 mg

## 2021-08-13 MED ORDER — ONDANSETRON HCL 4 MG/2ML IJ SOLN
4.0000 mg | Freq: Four times a day (QID) | INTRAMUSCULAR | Status: AC | PRN
  Administered 2021-08-13: 4 mg via INTRAVENOUS

## 2021-08-13 MED ORDER — MIDAZOLAM HCL 2 MG/2ML IJ SOLN
INTRAMUSCULAR | Status: AC
  Filled 2021-08-13: qty 2

## 2021-08-13 MED ORDER — METHOCARBAMOL 1000 MG/10ML IJ SOLN: 500.0000 mg | Freq: Four times a day (QID) | INTRAVENOUS | Status: DC | PRN

## 2021-08-13 MED ORDER — PRONTOSAN WOUND IRRIGATION OPTIME
TOPICAL | Status: DC | PRN
  Administered 2021-08-13: 1 via TOPICAL

## 2021-08-13 MED ORDER — CEFAZOLIN SODIUM-DEXTROSE 2-4 GM/100ML-% IV SOLN
2.0000 g | INTRAVENOUS | Status: AC
  Administered 2021-08-13: 2 g via INTRAVENOUS
  Filled 2021-08-13: qty 100

## 2021-08-13 MED ORDER — VANCOMYCIN HCL 1000 MG IV SOLR
INTRAVENOUS | Status: DC | PRN
  Administered 2021-08-13: 1000 mg

## 2021-08-13 MED ORDER — KETOROLAC TROMETHAMINE 15 MG/ML IJ SOLN
7.5000 mg | Freq: Four times a day (QID) | INTRAMUSCULAR | Status: AC
  Administered 2021-08-13 – 2021-08-14 (×4): 7.5 mg via INTRAVENOUS
  Filled 2021-08-13 (×3): qty 1

## 2021-08-13 MED ORDER — OXYCODONE HCL 5 MG PO TABS
10.0000 mg | ORAL_TABLET | ORAL | Status: DC | PRN
  Administered 2021-08-13 – 2021-08-14 (×2): 10 mg via ORAL

## 2021-08-13 MED ORDER — DEXAMETHASONE SODIUM PHOSPHATE 10 MG/ML IJ SOLN
INTRAMUSCULAR | Status: DC | PRN
  Administered 2021-08-13: 10 mg via INTRAVENOUS

## 2021-08-13 MED ORDER — FERROUS SULFATE 325 (65 FE) MG PO TABS
325.0000 mg | ORAL_TABLET | Freq: Two times a day (BID) | ORAL | Status: DC
Start: 2021-08-13 — End: 2021-08-14
  Administered 2021-08-13 – 2021-08-14 (×2): 325 mg via ORAL
  Filled 2021-08-13 (×2): qty 1

## 2021-08-13 MED ORDER — BUPIVACAINE-MELOXICAM ER 400-12 MG/14ML IJ SOLN
INTRAMUSCULAR | Status: AC
  Filled 2021-08-13: qty 1

## 2021-08-13 MED ORDER — ONDANSETRON HCL 4 MG PO TABS: 4.0000 mg | ORAL_TABLET | Freq: Four times a day (QID) | ORAL | Status: DC | PRN

## 2021-08-13 MED ORDER — ACETAMINOPHEN 10 MG/ML IV SOLN
1000.0000 mg | Freq: Once | INTRAVENOUS | Status: AC
  Filled 2021-08-13: qty 100

## 2021-08-13 MED ORDER — CHLORHEXIDINE GLUCONATE 0.12 % MT SOLN
15.0000 mL | Freq: Once | OROMUCOSAL | Status: AC
  Administered 2021-08-13: 15 mL via OROMUCOSAL
  Filled 2021-08-13: qty 15

## 2021-08-13 MED ORDER — ROPIVACAINE HCL 5 MG/ML IJ SOLN
INTRAMUSCULAR | Status: DC | PRN
  Administered 2021-08-13: 25 mL via PERINEURAL

## 2021-08-13 MED ORDER — TRANEXAMIC ACID-NACL 1000-0.7 MG/100ML-% IV SOLN
1000.0000 mg | INTRAVENOUS | Status: AC
  Administered 2021-08-13: 1000 mg via INTRAVENOUS
  Filled 2021-08-13: qty 100

## 2021-08-13 MED ORDER — MENTHOL 3 MG MT LOZG
1.0000 | LOZENGE | OROMUCOSAL | Status: DC | PRN
  Filled 2021-08-13: qty 9

## 2021-08-13 MED ORDER — KETOROLAC TROMETHAMINE 30 MG/ML IJ SOLN
INTRAMUSCULAR | Status: AC
  Filled 2021-08-13: qty 1

## 2021-08-13 MED ORDER — SODIUM CHLORIDE 0.9 % IV SOLN: INTRAVENOUS | Status: DC

## 2021-08-13 MED ORDER — METHOCARBAMOL 500 MG PO TABS
500.0000 mg | ORAL_TABLET | Freq: Four times a day (QID) | ORAL | Status: DC | PRN
  Administered 2021-08-14: 500 mg via ORAL
  Filled 2021-08-13 (×2): qty 1

## 2021-08-13 MED ORDER — FENTANYL CITRATE (PF) 100 MCG/2ML IJ SOLN
25.0000 ug | INTRAMUSCULAR | Status: DC | PRN
  Administered 2021-08-13: 50 ug via INTRAVENOUS

## 2021-08-13 MED ORDER — DEXAMETHASONE SODIUM PHOSPHATE 10 MG/ML IJ SOLN
INTRAMUSCULAR | Status: AC
Start: 2021-08-13 — End: ?
  Filled 2021-08-13: qty 1

## 2021-08-13 MED ORDER — HYDROMORPHONE HCL 1 MG/ML IJ SOLN: 0.5000 mg | INTRAMUSCULAR | Status: DC | PRN

## 2021-08-13 MED ORDER — VANCOMYCIN HCL 1000 MG IV SOLR
INTRAVENOUS | Status: AC
  Filled 2021-08-13: qty 20

## 2021-08-13 MED ORDER — ACETAMINOPHEN 325 MG PO TABS: 325.0000 mg | ORAL_TABLET | Freq: Four times a day (QID) | ORAL | Status: DC | PRN

## 2021-08-13 MED ORDER — NOREPINEPHRINE 4 MG/250ML-% IV SOLN: INTRAVENOUS | Status: DC | PRN

## 2021-08-13 SURGICAL SUPPLY — 87 items
ADH SKN CLS APL DERMABOND .7 (GAUZE/BANDAGES/DRESSINGS) ×1
ALCOHOL 70% 16 OZ (MISCELLANEOUS) ×2 IMPLANT
BAG COUNTER SPONGE SURGICOUNT (BAG) IMPLANT
BAG DECANTER FOR FLEXI CONT (MISCELLANEOUS) ×2 IMPLANT
BAG SPNG CNTER NS LX DISP (BAG)
BANDAGE ESMARK 6X9 LF (GAUZE/BANDAGES/DRESSINGS) IMPLANT
BLADE SAG 18X100X1.27 (BLADE) ×2 IMPLANT
BNDG CMPR 9X6 STRL LF SNTH (GAUZE/BANDAGES/DRESSINGS)
BNDG ESMARK 6X9 LF (GAUZE/BANDAGES/DRESSINGS)
BOWL SMART MIX CTS (DISPOSABLE) ×1 IMPLANT
BSPLAT TIB 5D E CMNT KN RT (Knees) ×1 IMPLANT
CEMENT BONE REFOBACIN R1X40 US (Cement) ×2 IMPLANT
CLSR STERI-STRIP ANTIMIC 1/2X4 (GAUZE/BANDAGES/DRESSINGS) ×2 IMPLANT
COMP FEM CMT KNEE 7 STD RT (Joint) ×2 IMPLANT
COMPONENT FEM CMT KN 7 STD RT (Joint) IMPLANT
COOLER ICEMAN CLASSIC (MISCELLANEOUS) ×2 IMPLANT
COVER SURGICAL LIGHT HANDLE (MISCELLANEOUS) ×2 IMPLANT
CUFF TOURN SGL QUICK 34 (TOURNIQUET CUFF)
CUFF TOURN SGL QUICK 42 (TOURNIQUET CUFF) IMPLANT
CUFF TRNQT CYL 34X4.125X (TOURNIQUET CUFF) ×1 IMPLANT
DERMABOND ADVANCED (GAUZE/BANDAGES/DRESSINGS) ×1
DERMABOND ADVANCED .7 DNX12 (GAUZE/BANDAGES/DRESSINGS) ×1 IMPLANT
DRAPE EXTREMITY T 121X128X90 (DISPOSABLE) ×2 IMPLANT
DRAPE HALF SHEET 40X57 (DRAPES) ×2 IMPLANT
DRAPE INCISE IOBAN 66X45 STRL (DRAPES) ×2 IMPLANT
DRAPE ORTHO SPLIT 77X108 STRL (DRAPES) ×4
DRAPE POUCH INSTRU U-SHP 10X18 (DRAPES) ×2 IMPLANT
DRAPE SURG ORHT 6 SPLT 77X108 (DRAPES) ×2 IMPLANT
DRAPE U-SHAPE 47X51 STRL (DRAPES) ×4 IMPLANT
DRSG AQUACEL AG ADV 3.5X10 (GAUZE/BANDAGES/DRESSINGS) ×2 IMPLANT
DURAPREP 26ML APPLICATOR (WOUND CARE) ×6 IMPLANT
ELECT CAUTERY BLADE 6.4 (BLADE) ×2 IMPLANT
ELECT REM PT RETURN 9FT ADLT (ELECTROSURGICAL) ×2
ELECTRODE REM PT RTRN 9FT ADLT (ELECTROSURGICAL) ×1 IMPLANT
GLOVE BIOGEL PI IND STRL 7.0 (GLOVE) ×1 IMPLANT
GLOVE BIOGEL PI IND STRL 7.5 (GLOVE) ×4 IMPLANT
GLOVE BIOGEL PI INDICATOR 7.0 (GLOVE) ×1
GLOVE BIOGEL PI INDICATOR 7.5 (GLOVE) ×4
GLOVE ECLIPSE 7.0 STRL STRAW (GLOVE) ×6 IMPLANT
GLOVE SKINSENSE NS SZ7.5 (GLOVE) ×3
GLOVE SKINSENSE STRL SZ7.5 (GLOVE) ×3 IMPLANT
GLOVE SURG UNDER LTX SZ7.5 (GLOVE) ×4 IMPLANT
GLOVE SURG UNDER POLY LF SZ7 (GLOVE) ×4 IMPLANT
GOWN STRL REIN XL XLG (GOWN DISPOSABLE) ×2 IMPLANT
GOWN STRL REUS W/ TWL LRG LVL3 (GOWN DISPOSABLE) ×1 IMPLANT
GOWN STRL REUS W/TWL LRG LVL3 (GOWN DISPOSABLE) ×2
HANDPIECE INTERPULSE COAX TIP (DISPOSABLE) ×2
HDLS TROCR DRIL PIN KNEE 75 (PIN) ×2
HOOD PEEL AWAY FLYTE STAYCOOL (MISCELLANEOUS) ×4 IMPLANT
INSERT TIB AS PERS 6-7X13 RT (Insert) ×1 IMPLANT
JET LAVAGE IRRISEPT WOUND (IRRIGATION / IRRIGATOR)
KIT BASIN OR (CUSTOM PROCEDURE TRAY) ×2 IMPLANT
KIT TURNOVER KIT B (KITS) ×2 IMPLANT
LAVAGE JET IRRISEPT WOUND (IRRIGATION / IRRIGATOR) ×1 IMPLANT
MANIFOLD NEPTUNE II (INSTRUMENTS) ×2 IMPLANT
MARKER SKIN DUAL TIP RULER LAB (MISCELLANEOUS) ×4 IMPLANT
NDL SPNL 18GX3.5 QUINCKE PK (NEEDLE) ×1 IMPLANT
NEEDLE SPNL 18GX3.5 QUINCKE PK (NEEDLE) ×2 IMPLANT
NS IRRIG 1000ML POUR BTL (IV SOLUTION) ×2 IMPLANT
PACK TOTAL JOINT (CUSTOM PROCEDURE TRAY) ×2 IMPLANT
PAD ARMBOARD 7.5X6 YLW CONV (MISCELLANEOUS) ×4 IMPLANT
PAD COLD SHLDR WRAP-ON (PAD) ×2 IMPLANT
PIN DRILL HDLS TROCAR 75 4PK (PIN) IMPLANT
SAW OSC TIP CART 19.5X105X1.3 (SAW) ×2 IMPLANT
SCREW FEMALE HEX FIX 25X2.5 (ORTHOPEDIC DISPOSABLE SUPPLIES) ×1 IMPLANT
SET HNDPC FAN SPRY TIP SCT (DISPOSABLE) ×1 IMPLANT
STAPLER VISISTAT 35W (STAPLE) IMPLANT
STEM POLY PAT PLY 35M KNEE (Knees) ×1 IMPLANT
STEM TIBIA 5 DEG SZ E R KNEE (Knees) IMPLANT
SUCTION FRAZIER HANDLE 10FR (MISCELLANEOUS) ×2
SUCTION TUBE FRAZIER 10FR DISP (MISCELLANEOUS) ×1 IMPLANT
SUT ETHILON 2 0 FS 18 (SUTURE) ×4 IMPLANT
SUT MNCRL AB 3-0 PS2 27 (SUTURE) IMPLANT
SUT VIC AB 0 CT1 27 (SUTURE) ×4
SUT VIC AB 0 CT1 27XBRD ANBCTR (SUTURE) ×2 IMPLANT
SUT VIC AB 1 CT1 27 (SUTURE) ×2
SUT VIC AB 1 CT1 27XBRD ANBCTR (SUTURE) IMPLANT
SUT VIC AB 1 CTX 27 (SUTURE) ×5 IMPLANT
SUT VIC AB 2-0 CT1 27 (SUTURE) ×6
SUT VIC AB 2-0 CT1 TAPERPNT 27 (SUTURE) ×4 IMPLANT
SYR 50ML LL SCALE MARK (SYRINGE) ×4 IMPLANT
TIBIA STEM 5 DEG SZ E R KNEE (Knees) ×2 IMPLANT
TOWEL GREEN STERILE (TOWEL DISPOSABLE) ×2 IMPLANT
TOWEL GREEN STERILE FF (TOWEL DISPOSABLE) ×2 IMPLANT
TRAY CATH 16FR W/PLASTIC CATH (SET/KITS/TRAYS/PACK) ×1 IMPLANT
UNDERPAD 30X36 HEAVY ABSORB (UNDERPADS AND DIAPERS) ×2 IMPLANT
YANKAUER SUCT BULB TIP NO VENT (SUCTIONS) ×4 IMPLANT

## 2021-08-13 NOTE — H&P (Signed)
PREOPERATIVE H&P  Chief Complaint: right knee degenertive joint disease  HPI: Shannon [name] is a 71 y.o. female who presents for surgical treatment of right knee degenertive joint disease.  She denies any changes in medical history.  Past Medical History:  Diagnosis Date   Arthritis    left foot   Blood transfusion without reported diagnosis    Chronic neck and back pain    received physical therapy   Dysrhythmia    Pt told she had "benign arrythmia" after wearing monitor   GERD (gastroesophageal reflux disease)    with esophagitis   Motion sickness    boats, planes   Numbness    Right outer thigh, constant, notices more with standing for a long period of time   Plantar fasciitis    left   PONV (postoperative nausea and vomiting)    Past Surgical History:  Procedure Laterality Date   ARTHRODESIS METATARSAL Left 12/30/2018   Procedure: ARTHRODESIS,LISFRANC;MULTIPLE LEFT;  Surgeon: Samara Deist, DPM;  Location: [facility];  Service: Podiatry;  Laterality: Left;  general with local   BLADDER SUSPENSION N/A 07/04/2015   Procedure: TRANSVAGINAL TAPE (TVT) SLING                   ;  Surgeon: Emily Filbert, MD;  Location: [facility];  Service: Gynecology;  Laterality: N/A;   COLONOSCOPY     COLONOSCOPY WITH PROPOFOL N/A 09/08/2019   Procedure: COLONOSCOPY WITH PROPOFOL;  Surgeon: Virgel Manifold, MD;  Location: [facility];  Service: Endoscopy;  Laterality: N/A;   CYSTOCELE REPAIR N/A 07/04/2015   Procedure: ANTERIOR REPAIR (CYSTOCELE);  Surgeon: Emily Filbert, MD;  Location: [facility];  Service: Gynecology;  Laterality: N/A;   CYSTOSCOPY N/A 07/04/2015   Procedure: CYSTOSCOPY;  Surgeon: Emily Filbert, MD;  Location: [facility];  Service: Gynecology;  Laterality: N/A;   DILATION AND CURETTAGE OF UTERUS     EXCISION MORTON'S NEUROMA Left 08/12/2018   Procedure: EXCISION MORTON'S NEUROMA;  Surgeon: Samara Deist, DPM;  Location: [facility];  Service: Podiatry;   Laterality: Left;  lma local   POLYPECTOMY     TUBAL LIGATION  1987   UPPER GI ENDOSCOPY     VAGINAL HYSTERECTOMY N/A 07/04/2015   Procedure: TOTAL VAGINAL HYSTERECTOMY  ;  Surgeon: Emily Filbert, MD;  Location: [facility];  Service: Gynecology;  Laterality: N/A;   Social History   Socioeconomic History   Marital status: Married    Spouse name: Not on file   Number of children: Not on file   Years of education: Not on file   Highest education level: Not on file  Occupational History   Not on file  Tobacco Use   Smoking status: Never   Smokeless tobacco: Never  Vaping Use   Vaping Use: Never used  Substance and Sexual Activity   Alcohol use: No    Alcohol/week: 0.0 standard drinks of alcohol    Comment: rare   Drug use: No   Sexual activity: Not Currently    Birth control/protection: Post-menopausal, Surgical  Other Topics Concern   Not on file  Social History Narrative   Not on file   Social Determinants of Health   Financial Resource Strain: Low Risk  (06/07/2021)   Overall Financial Resource Strain (CARDIA)    Difficulty of Paying Living Expenses: Not hard at all  Food Insecurity: No Food Insecurity (06/07/2021)   Hunger Vital Sign    Worried About Running  Out of Food in the Last Year: Never true    Ran Out of Food in the Last Year: Never true  Transportation Needs: No Transportation Needs (06/07/2021)   PRAPARE - Hydrologist (Medical): No    Lack of Transportation (Non-Medical): No  Physical Activity: Inactive (06/07/2021)   Exercise Vital Sign    Days of Exercise per Week: 0 days    Minutes of Exercise per Session: 0 min  Stress: No Stress Concern Present (06/07/2021)   [place]    Feeling of Stress : Not at all  Social Connections: Moderately Isolated (06/07/2021)   Social Connection and Isolation Panel [NHANES]    Frequency of Communication with Friends and Family: More  than three times a week    Frequency of Social Gatherings with Friends and Family: More than three times a week    Attends Religious Services: Never    Marine scientist or Organizations: No    Attends Music therapist: Never    Marital Status: Married   Family History  Problem Relation Age of Onset   Stroke Mother        x 2   Heart disease Mother        congenital arrhythmia and CHF   Depression Mother    Atrial fibrillation Mother    Stomach cancer Paternal Uncle    Prostate cancer Brother    Atrial fibrillation Brother    Colon cancer Neg Hx    Esophageal cancer Neg Hx    Kidney cancer Neg Hx    Bladder Cancer Neg Hx    Breast cancer Neg Hx    Allergies  Allergen Reactions   Adhesive [Tape] Rash    (not sure if rash was from ointment or tape)   Alpha-Gal Rash    (red meat S/P tick bite)   Neomycin-Bacitracin Zn-Polymyx Rash    Ointment   Nickel Rash   Prior to Admission medications   Medication Sig Start Date End Date Taking? Authorizing Provider  acetaminophen (TYLENOL) 500 MG tablet Take 1,000 mg by mouth every 8 (eight) hours as needed for moderate pain.   Yes [provider]  acetaminophen (TYLENOL) 650 MG CR tablet Take 650 mg by mouth every 8 (eight) hours as needed for pain.   Yes [provider]  aspirin EC 81 MG tablet Take 1 tablet (81 mg total) by mouth 2 (two) times daily. To be taken after surgery to prevent blood clots.  Swallow whole. 08/06/21 08/06/22  Aundra Dubin, PA-C  Calcium Carbonate-Simethicone (PHAZYME GAS & ACID MAX ST PO) Take 1 tablet by mouth as needed (bloating).   Yes [provider]  cholecalciferol (VITAMIN D3) 25 MCG (1000 UNIT) tablet Take 1,000 Units by mouth daily.   Yes [provider]  diclofenac Sodium (VOLTAREN) 1 % GEL Apply 1 Application topically 4 (four) times daily as needed (pain).   Yes [provider]  docusate sodium (COLACE) 100 MG capsule Take 1 capsule  (100 mg total) by mouth daily as needed. 08/06/21 08/06/22  Aundra Dubin, PA-C  famotidine (PEPCID) 20 MG tablet Take 20 mg by mouth daily as needed for heartburn or indigestion.   Yes [provider]  GAMMA AMINOBUTYRIC ACID PO Take 500 mg by mouth daily.   Yes [provider]  MAGNESIUM GLYCINATE PO Take 400 mg by mouth daily.   Yes [provider]  melatonin  5 MG TABS Take 5 mg by mouth at bedtime.   Yes [provider]  methocarbamol (ROBAXIN-750) 750 MG tablet Take 1 tablet (750 mg total) by mouth 2 (two) times daily as needed for muscle spasms. 08/06/21   Aundra Dubin, PA-C  mirabegron ER (MYRBETRIQ) 50 MG TB24 tablet Take 1 tablet (50 mg total) by mouth daily. 07/25/21  Yes Donnamae Jude, MD  Misc Natural Products (PUMPKIN SEED OIL PO) Take 200 mg by mouth in the morning and at bedtime.   Yes [provider]  Multiple Vitamin (MULTIVITAMIN) tablet Take 1 tablet by mouth daily.   Yes [provider]  naproxen sodium (ALEVE) 220 MG tablet Take 220 mg by mouth 2 (two) times daily as needed (pain).   Yes [provider]  ondansetron (ZOFRAN) 4 MG tablet Take 1 tablet (4 mg total) by mouth every 8 (eight) hours as needed for nausea or vomiting. 08/06/21   Aundra Dubin, PA-C  oxyCODONE-acetaminophen (PERCOCET) 5-325 MG tablet Take 1-2 tablets by mouth every 6 (six) hours as needed. 08/06/21   Aundra Dubin, PA-C  [place] Wort 300 MG TABS Take 600 mg by mouth in the morning and at bedtime.   Yes [provider]     Positive ROS: All other systems have been reviewed and were otherwise negative with the exception of those mentioned in the HPI and as above.  Physical Exam: General: Alert, no acute distress Cardiovascular: No pedal edema Respiratory: No cyanosis, no use of accessory musculature GI: abdomen soft Skin: No lesions in the area of chief complaint Neurologic: Sensation intact distally Psychiatric:  Patient is competent for consent with normal mood and affect Lymphatic: no lymphedema  MUSCULOSKELETAL: exam stable  Assessment: right knee degenertive joint disease  Plan: Plan for Procedure(s): RIGHT TOTAL KNEE ARTHROPLASTY  The risks benefits and alternatives were discussed with the patient including but not limited to the risks of nonoperative treatment, versus surgical intervention including infection, bleeding, nerve injury,  blood clots, cardiopulmonary complications, morbidity, mortality, among others, and they were willing to proceed.   Eduard Roux, MD 08/13/2021 5:47 AM

## 2021-08-13 NOTE — Discharge Instructions (Signed)

## 2021-08-13 NOTE — Transfer of Care (Signed)
Immediate Anesthesia Transfer of Care Note  Patient: Shannon [name]  Procedure(s) Performed: RIGHT TOTAL KNEE ARTHROPLASTY (Right: Knee)  Patient Location: PACU  Anesthesia Type:Spinal and MAC combined with regional for post-op pain  Level of Consciousness: awake, alert  and oriented  Airway & Oxygen Therapy: Patient Spontanous Breathing  Post-op Assessment: Report given to RN and Post -op Vital signs reviewed and stable  Post vital signs: Reviewed and stable  Last Vitals:  Vitals Value Taken Time  BP 129/72 08/13/21 1145  Temp    Pulse 71 08/13/21 1146  Resp 14 08/13/21 1146  SpO2 98 % 08/13/21 1146  Vitals shown include unvalidated device data.  Last Pain:  Vitals:   08/13/21 0618  TempSrc:   PainSc: 0-No pain      Patients Stated Pain Goal: 0 (64/68/03 2122)  Complications: No notable events documented.

## 2021-08-13 NOTE — Evaluation (Signed)
Physical Therapy Evaluation Patient Details Name: Shannon [name] MRN: [id number] DOB: 07/17/1950 Today's Date: 08/13/2021  History of Present Illness  Pt is a 71 y/o female admitted secondary to R TKA. PMH includes plantar fasciitis.  Clinical Impression  Pt admitted secondary to problem above with deficits below. Pt requiring min guard A for mobility tasks using RW. Reviewed knee precautions and performing ankle pumps 20x/hour. Reports husband will be able to assist as needed at d/c. Will continue to follow acutely.        Recommendations for follow up therapy are one component of a multi-disciplinary discharge planning process, led by the attending physician.  Recommendations may be updated based on patient status, additional functional criteria and insurance authorization.  Follow Up Recommendations Follow physician's recommendations for discharge plan and follow up therapies      Assistance Recommended at Discharge Intermittent Supervision/Assistance  Patient can return home with the following  Assistance with cooking/housework;Assist for transportation    Equipment Recommendations None recommended by PT  Recommendations for Other Services       Functional Status Assessment Patient has had a recent decline in their functional status and demonstrates the ability to make significant improvements in function in a reasonable and predictable amount of time.     Precautions / Restrictions Precautions Precautions: Knee Precaution Booklet Issued: No Precaution Comments: Verbally reviewed knee precautions. Restrictions Weight Bearing Restrictions: Yes RLE Weight Bearing: Weight bearing as tolerated      Mobility  Bed Mobility Overal bed mobility: Needs Assistance Bed Mobility: Supine to Sit     Supine to sit: Supervision     General bed mobility comments: supervision for safety.    Transfers Overall transfer level: Needs assistance Equipment used: Rolling walker (2  wheels) Transfers: Sit to/from Stand, Bed to chair/wheelchair/BSC Sit to Stand: Min guard Stand pivot transfers: Min guard         General transfer comment: Min guard for safety to stand and take steps over to chair. Pt reports some nausea, so further mobility deferred.    Ambulation/Gait                  Stairs            Wheelchair Mobility    Modified Rankin (Stroke Patients Only)       Balance Overall balance assessment: Needs assistance Sitting-balance support: No upper extremity supported, Feet supported Sitting balance-Leahy Scale: Good     Standing balance support: Bilateral upper extremity supported Standing balance-Leahy Scale: Poor Standing balance comment: Reliant on UE support                             Pertinent Vitals/Pain Pain Assessment Pain Assessment: Faces Faces Pain Scale: Hurts even more Pain Location: R knee Pain Descriptors / Indicators: Grimacing, Guarding Pain Intervention(s): Limited activity within patient's tolerance, Monitored during session, Repositioned    Home Living Family/patient expects to be discharged to:: Private residence Living Arrangements: Spouse/significant other Available Help at Discharge: Family Type of Home: House Home Access: Stairs to enter Entrance Stairs-Rails: Left (both sides on first set of 3, on the L on the set of 2) Entrance Stairs-Number of Steps: 3, landing and then 2 more   Home Layout: Two level;Able to live on main level with bedroom/bathroom Home Equipment: BSC/3in1;Shower seat;Rolling Walker (2 wheels)      Prior Function Prior Level of Function : Independent/Modified Independent  Hand Dominance        Extremity/Trunk Assessment   Upper Extremity Assessment Upper Extremity Assessment: Overall WFL for tasks assessed    Lower Extremity Assessment Lower Extremity Assessment: RLE deficits/detail RLE Deficits / Details: Deficits  consistent with post op pain and weakness.    Cervical / Trunk Assessment Cervical / Trunk Assessment: Normal  Communication   Communication: No difficulties  Cognition Arousal/Alertness: Awake/alert Behavior During Therapy: WFL for tasks assessed/performed Overall Cognitive Status: Within Functional Limits for tasks assessed                                          General Comments      Exercises     Assessment/Plan    PT Assessment Patient needs continued PT services  PT Problem List Decreased strength;Decreased range of motion;Decreased activity tolerance;Decreased balance;Decreased mobility;Decreased knowledge of use of DME;Decreased knowledge of precautions;Pain       PT Treatment Interventions DME instruction;Gait training;Stair training;Functional mobility training;Therapeutic activities;Therapeutic exercise;Balance training;Patient/family education    PT Goals (Current goals can be found in the Care Plan section)  Acute Rehab PT Goals Patient Stated Goal: to go home PT Goal Formulation: With patient Time For Goal Achievement: 08/27/21 Potential to Achieve Goals: Good    Frequency 7X/week     Co-evaluation               AM-PAC PT "6 Clicks" Mobility  Outcome Measure Help needed turning from your back to your side while in a flat bed without using bedrails?: None Help needed moving from lying on your back to sitting on the side of a flat bed without using bedrails?: A Little Help needed moving to and from a bed to a chair (including a wheelchair)?: A Little Help needed standing up from a chair using your arms (e.g., wheelchair or bedside chair)?: A Little Help needed to walk in hospital room?: A Little Help needed climbing 3-5 steps with a railing? : A Little 6 Click Score: 19    End of Session Equipment Utilized During Treatment: Gait belt Activity Tolerance: Patient tolerated treatment well Patient left: with call bell/phone within  reach;in chair;with family/visitor present Nurse Communication: Mobility status PT Visit Diagnosis: Other abnormalities of gait and mobility (R26.89);Difficulty in walking, not elsewhere classified (R26.2);Pain Pain - Right/Left: Right Pain - part of body: Knee    Time: 1444-1500 PT Time Calculation (min) (ACUTE ONLY): 16 min   Charges:   PT Evaluation $PT Eval Low Complexity: 1 Low          Lou Miner, DPT  Acute Rehabilitation Services  Office: [phone]   Rudean Hitt 08/13/2021, 3:32 PM

## 2021-08-13 NOTE — Anesthesia Procedure Notes (Signed)
Anesthesia Regional Block: Adductor canal block   Pre-Anesthetic Checklist: , timeout performed,  Correct Patient, Correct Site, Correct Laterality,  Correct Procedure, Correct Position, site marked,  Risks and benefits discussed,  Surgical consent,  Pre-op evaluation,  At surgeon's request and post-op pain management  Laterality: Right  Prep: chloraprep       Needles:  Injection technique: Single-shot  Needle Type: Echogenic Needle     Needle Length: 9cm  Needle Gauge: 21     Additional Needles:   Narrative:  Start time: 08/13/2021 9:22 AM End time: 08/13/2021 9:30 AM Injection made incrementally with aspirations every 5 mL.  Performed by: Personally  Anesthesiologist: Albertha Ghee, MD  Additional Notes: Pt tolerated the procedure well.

## 2021-08-13 NOTE — Anesthesia Preprocedure Evaluation (Signed)
Anesthesia Evaluation  Patient identified by MRN, date of birth, ID band Patient awake    Reviewed: Allergy & Precautions, H&P , NPO status , Patient's Chart, lab work & pertinent test results  History of Anesthesia Complications (+) PONV and history of anesthetic complications  Airway Mallampati: II   Neck ROM: full    Dental   Pulmonary neg pulmonary ROS,    breath sounds clear to auscultation       Cardiovascular negative cardio ROS   Rhythm:regular Rate:Normal     Neuro/Psych    GI/Hepatic GERD  ,  Endo/Other    Renal/GU      Musculoskeletal  (+) Arthritis ,   Abdominal   Peds  Hematology   Anesthesia Other Findings   Reproductive/Obstetrics                             Anesthesia Physical Anesthesia Plan  ASA: 2  Anesthesia Plan: Spinal and MAC   Post-op Pain Management: Regional block*   Induction: Intravenous  PONV Risk Score and Plan: 3 and Ondansetron, Dexamethasone, Midazolam and Treatment may vary due to age or medical condition  Airway Management Planned: Simple Face Mask  Additional Equipment:   Intra-op Plan:   Post-operative Plan:   Informed Consent: I have reviewed the patients History and Physical, chart, labs and discussed the procedure including the risks, benefits and alternatives for the proposed anesthesia with the patient or authorized representative who has indicated his/her understanding and acceptance.     Dental advisory given  Plan Discussed with: CRNA, Anesthesiologist and Surgeon  Anesthesia Plan Comments:         Anesthesia Quick Evaluation

## 2021-08-13 NOTE — Anesthesia Procedure Notes (Signed)
Procedure Name: MAC Date/Time: 08/13/2021 9:40 AM  Performed by: Kyung Rudd, CRNAPre-anesthesia Checklist: Patient identified, Emergency Drugs available, Suction available and Patient being monitored Patient Re-evaluated:Patient Re-evaluated prior to induction Oxygen Delivery Method: Simple face mask Induction Type: IV induction Placement Confirmation: positive ETCO2 Dental Injury: Teeth and Oropharynx as per pre-operative assessment

## 2021-08-13 NOTE — Progress Notes (Signed)
amb  

## 2021-08-13 NOTE — Anesthesia Procedure Notes (Signed)
Spinal  Patient location during procedure: OR Start time: 08/13/2021 9:36 AM End time: 08/13/2021 9:38 AM Reason for block: surgical anesthesia Staffing Performed: anesthesiologist  Anesthesiologist: Albertha Ghee, MD Performed by: Albertha Ghee, MD Authorized by: Albertha Ghee, MD   Preanesthetic Checklist Completed: patient identified, IV checked, risks and benefits discussed, surgical consent, monitors and equipment checked, pre-op evaluation and timeout performed Spinal Block Patient position: sitting Prep: DuraPrep Patient monitoring: cardiac monitor, continuous pulse ox and blood pressure Approach: midline Location: L3-4 Injection technique: single-shot Needle Needle type: Pencan  Needle gauge: 24 G Needle length: 9 cm Assessment Sensory level: T10 Events: CSF return Additional Notes Functioning IV was confirmed and monitors were applied. Sterile prep and drape, including hand hygiene and sterile gloves were used. The patient was positioned and the spine was prepped. The skin was anesthetized with lidocaine.  Free flow of clear CSF was obtained prior to injecting local anesthetic into the CSF.  The spinal needle aspirated freely following injection.  The needle was carefully withdrawn.  The patient tolerated the procedure well.

## 2021-08-13 NOTE — TOC Initial Note (Signed)
Transition of Care [facility]) - Initial/Assessment Note    Patient Details  Name: Shannon [name] MRN: [id number] Date of Birth: 06-24-1950  [facility] CM/SW Contact:    Ninfa Meeker, RN Phone Number: 08/13/2021, 2:31 PM  Clinical Narrative:    Patient's Home Health was preoperatively arranged by MD office Case Manager. She will have [place]. CPM has been delivered to her home, will have family support at discharge.         Patient Goals and CMS Choice        Expected Discharge Plan and Services                                                Prior Living Arrangements/Services                       Activities of Daily Living      Permission Sought/Granted                  Emotional Assessment              Admission diagnosis:  Status post total right knee replacement [Z96.651] Patient Active Problem List   Diagnosis Date Noted   Status post total right knee replacement 08/13/2021   Pre-operative cardiovascular examination 06/10/2021   Pre-op examination 05/31/2021   Primary osteoarthritis of right knee 03/28/2021   Polyp of colon    History of PSVT (paroxysmal supraventricular tachycardia) 06/17/2017   Recurrent UTI 08/23/2016   Estrogen deficiency 12/06/2015   Elevated blood sugar 12/06/2015   Incontinence of urine in female 04/12/2015   Medicare annual wellness visit, subsequent 11/30/2014   Routine general medical examination at a health care facility 11/05/2012   PLANTAR FASCIITIS, LEFT 07/28/2009   PERSONAL HX COLONIC POLYPS 11/02/2007   GERD 10/20/2006   PCP:  Abner Greenspan, MD Pharmacy:   [facility] [id number], [place] [address] Phone: [phone] Fax: [phone]  [id number] [address]: [phone]: [address]Phone: [phone]: [phone]    Social Determinants of Health (SDOH) Interventions    Readmission Risk Interventions     No data to display

## 2021-08-13 NOTE — Op Note (Signed)
Total Knee Arthroplasty Procedure Note  Preoperative diagnosis: Right knee osteoarthritis  Postoperative diagnosis:same  Operative procedure: Right total knee arthroplasty. CPT [phone]  Surgeon: N. Eduard Roux, MD  Assist: Madalyn Rob, PA-C; necessary for the timely completion of procedure and due to complexity of procedure.  Anesthesia: Spinal, regional, local  Tourniquet time: see anesthesia record  Implants used: Persona Zimmer Femur: CR 7 nickel free Tibia: E nickel free Patella: 35 mm Polyethylene: 13 mm, MC  Indication: Shannon [name] is a 71 y.o. year old female with a history of knee pain. Having failed conservative management, the patient elected to proceed with a total knee arthroplasty.  We have reviewed the risk and benefits of the surgery and they elected to proceed after voicing understanding.  Procedure:  After informed consent was obtained and understanding of the risk were voiced including but not limited to bleeding, infection, damage to surrounding structures including nerves and vessels, blood clots, leg length inequality and the failure to achieve desired results, the operative extremity was marked with verbal confirmation of the patient in the holding area.   The patient was then brought to the operating room and transported to the operating room table in the supine position.  A tourniquet was applied to the operative extremity around the upper thigh. The operative limb was then prepped and draped in the usual sterile fashion and preoperative antibiotics were administered.  A time out was performed prior to the start of surgery confirming the correct extremity, preoperative antibiotic administration, as well as team members, implants and instruments available for the case. Correct surgical site was also confirmed with preoperative radiographs. The limb was then elevated for exsanguination and the tourniquet was inflated. A midline incision was made  and a standard medial parapatellar approach was performed.  The infrapatellar fat pad was removed.  Suprapatellar synovium was removed to reveal the anterior distal femoral cortex.  A medial peel was performed to release the capsule of the medial tibial plateau.  The patella was then everted and was prepared and sized to a 35 mm.  A cover was placed on the patella for protection from retractors.  The knee was then brought into full flexion and we then turned our attention to the femur.  The cruciates were sacrificed.  Start site was drilled in the femur and the intramedullary distal femoral cutting guide was placed, set at 5 degrees valgus, taking 10 mm of distal resection. The distal cut was made. Osteophytes were then removed.  Next, the proximal tibial cutting guide was placed with appropriate slope, varus/valgus alignment and depth of resection. The proximal tibial cut was made taking 4 mm off the low side below the sclerotic bone. Gap blocks were then used to assess the extension gap and alignment, and appropriate soft tissue releases were performed. Attention was turned back to the femur, which was sized using the sizing guide to a size 7. Appropriate rotation of the femoral component was determined using epicondylar axis, Whiteside's line, and assessing the flexion gap under ligament tension. The appropriate size 4-in-1 cutting block was placed and checked with an angel wing and cuts were made. Posterior femoral osteophytes and uncapped bone were then removed with the curved osteotome.  Trial components were placed, and stability was checked in full extension, mid-flexion, and deep flexion. Proper tibial rotation was determined and marked.  The patella tracked well without a lateral release.  The femoral lugs were then drilled. Trial components were then removed and tibial preparation  performed.  The tibia was sized for a size E component.   The bony surfaces were irrigated with a pulse lavage and then  dried. Bone cement was vacuum mixed on the back table, and the final components sized above were cemented into place.  Antibiotic irrigation was placed in the knee joint and soft tissues while the cement cured.  After cement had finished curing, excess cement was removed. The stability of the construct was re-evaluated throughout a range of motion and found to be acceptable. The trial liner was removed, the knee was copiously irrigated, and the knee was re-evaluated for any excess bone debris. The real polyethylene liner, 13 mm thick, was inserted and checked to ensure the locking mechanism had engaged appropriately. The tourniquet was deflated and hemostasis was achieved. The wound was irrigated with normal saline.  One gram of vancomycin powder was placed in the surgical bed.  Topical 0.25% bupivacaine and meloxicam was placed in the joint for postoperative pain.  Capsular closure was performed with a #1 vicryl, subcutaneous fat closed with a 0 vicryl suture, then subcutaneous tissue closed with interrupted 2.0 vicryl suture. The skin was then closed with a 2.0 nylon and dermabond. A sterile dressing was applied.  The patient was awakened in the operating room and taken to recovery in stable condition. All sponge, needle, and instrument counts were correct at the end of the case.  Tawanna Cooler was necessary for opening, closing, retracting, limb positioning and overall facilitation and completion of the surgery.  Position: supine  Complications: none.  Time Out: performed   Drains/Packing: none  Estimated blood loss: minimal  Returned to Recovery Room: in good condition.   Antibiotics: yes   Mechanical VTE (DVT) Prophylaxis: sequential compression devices, TED thigh-high  Chemical VTE (DVT) Prophylaxis: aspirin  Fluid Replacement  Crystalloid: see anesthesia record Blood: none  FFP: none   Specimens Removed: 1 to pathology   Sponge and Instrument Count Correct? yes   PACU: portable  radiograph - knee AP and Lateral   Plan/RTC: Return in 2 weeks for wound check.   Weight Bearing/Load Lower Extremity: full   Implant Name Type Inv. Item Serial No. Manufacturer Lot No. LRB No. Used Action  STEM POLY PAT PLY 74M KNEE - [id number] Knees STEM POLY PAT PLY 74M KNEE  ZIMMER RECON(ORTH,TRAU,BIO,SG) [id number] Right 1 Implanted  TIBIA STEM 5 DEG SZ E R KNEE - [id number] Knees TIBIA STEM 5 DEG SZ E R KNEE  ZIMMER RECON(ORTH,TRAU,BIO,SG) [id number] Right 1 Implanted  Persona Femur Cemented Cruciate Retaining Nitrided    ZIMMER [id number] Right 1 Implanted  INSERT TIB AS PERS 6-7X13 RT - [id number] Insert INSERT TIB AS PERS 6-7X13 RT  ZIMMER RECON(ORTH,TRAU,BIO,SG) [id number] Right 1 Implanted  CEMENT BONE REFOBACIN R1X40 [name] - [id number] Cement CEMENT BONE REFOBACIN R1X40 [name]  ZIMMER RECON(ORTH,TRAU,BIO,SG) [id number] Right 2 Implanted    N. Eduard Roux, MD [facility] 11:11 AM

## 2021-08-14 ENCOUNTER — Encounter (HOSPITAL_COMMUNITY): Payer: Self-pay | Admitting: Orthopaedic Surgery

## 2021-08-14 DIAGNOSIS — Z79899 Other long term (current) drug therapy: Secondary | ICD-10-CM | POA: Diagnosis not present

## 2021-08-14 DIAGNOSIS — Z7982 Long term (current) use of aspirin: Secondary | ICD-10-CM | POA: Diagnosis not present

## 2021-08-14 DIAGNOSIS — M25561 Pain in right knee: Secondary | ICD-10-CM | POA: Diagnosis not present

## 2021-08-14 DIAGNOSIS — M1711 Unilateral primary osteoarthritis, right knee: Secondary | ICD-10-CM | POA: Diagnosis not present

## 2021-08-14 LAB — CBC
HCT: 32.9 % — ABNORMAL LOW (ref 36.0–46.0)
Hemoglobin: 11.1 g/dL — ABNORMAL LOW (ref 12.0–15.0)
MCH: 30.6 pg (ref 26.0–34.0)
MCHC: 33.7 g/dL (ref 30.0–36.0)
MCV: 90.6 fL (ref 80.0–100.0)
Platelets: 274 10*3/uL (ref 150–400)
RBC: 3.63 MIL/uL — ABNORMAL LOW (ref 3.87–5.11)
RDW: 12.9 % (ref 11.5–15.5)
WBC: 11.6 10*3/uL — ABNORMAL HIGH (ref 4.0–10.5)
nRBC: 0 % (ref 0.0–0.2)

## 2021-08-14 NOTE — Progress Notes (Signed)
Subjective: 1 Day Post-Op Procedure(s) (LRB): RIGHT TOTAL KNEE ARTHROPLASTY (Right) Patient reports pain as mild.    Objective: Vital signs in last 24 hours: Temp:  [97.5 F (36.4 C)-98.7 F (37.1 C)] 98.5 F (36.9 C) (07/25 0743) Pulse Rate:  [57-80] 67 (07/25 0743) Resp:  [11-20] 18 (07/25 0743) BP: (109-140)/(51-76) 126/51 (07/25 0743) SpO2:  [93 %-100 %] 98 % (07/25 0743)  Intake/Output from previous day: 07/24 0701 - 07/25 0700 In: 900 [I.V.:900] Out: 2025 [Urine:1950; Blood:75] Intake/Output this shift: No intake/output data recorded.  Recent Labs    08/14/21 0502  HGB 11.1*   Recent Labs    08/14/21 0502  WBC 11.6*  RBC 3.63*  HCT 32.9*  PLT 274   No results for input(s): "NA", "K", "CL", "CO2", "BUN", "CREATININE", "GLUCOSE", "CALCIUM" in the last 72 hours. No results for input(s): "LABPT", "INR" in the last 72 hours.  Neurologically intact Neurovascular intact Sensation intact distally Intact pulses distally Dorsiflexion/Plantar flexion intact Incision: scant drainage No cellulitis present Compartment soft   Assessment/Plan: 1 Day Post-Op Procedure(s) (LRB): RIGHT TOTAL KNEE ARTHROPLASTY (Right) Advance diet Up with therapy D/C IV fluids Discharge home with home health after second PT session as long as she clears PT WBAT RLE ABLA- mild and stable   Anticipated LOS equal to or greater than 2 midnights due to - Age 73 and older with one or more of the following:  - Obesity  - Expected need for hospital services (PT, OT, Nursing) required for safe  discharge  - Anticipated need for postoperative skilled nursing care or inpatient rehab  - Active co-morbidities: Cardiac Arrhythmia OR   - Unanticipated findings during/Post Surgery: None  - Patient is a high risk of re-admission due to: None   Aundra Dubin 08/14/2021, 8:15 AM

## 2021-08-14 NOTE — Progress Notes (Signed)
Physical Therapy Treatment and Discharge Patient Details Name: Shannon [name] MRN: [id number] DOB: 1950-05-24 Today's Date: 08/14/2021   History of Present Illness Pt is a 71 y/o female admitted secondary to R TKA. PMH includes plantar fasciitis.    PT Comments    Patient doing extremely well with all exercises, gait, and stair training. No additional session needed today and pt is ready for discharge home from PT perspective with pt in agreement.    Recommendations for follow up therapy are one component of a multi-disciplinary discharge planning process, led by the attending physician.  Recommendations may be updated based on patient status, additional functional criteria and insurance authorization.  Follow Up Recommendations  Follow physician's recommendations for discharge plan and follow up therapies     Assistance Recommended at Discharge Intermittent Supervision/Assistance  Patient can return home with the following Assistance with cooking/housework;Assist for transportation;Help with stairs or ramp for entrance   Equipment Recommendations  None recommended by PT    Recommendations for Other Services       Precautions / Restrictions Precautions Precautions: Knee Precaution Booklet Issued: No Precaution Comments: Verbally reviewed knee precautions. Restrictions Weight Bearing Restrictions: Yes RLE Weight Bearing: Weight bearing as tolerated     Mobility  Bed Mobility               General bed mobility comments: up in chair    Transfers Overall transfer level: Needs assistance Equipment used: Rolling walker (2 wheels) Transfers: Sit to/from Stand Sit to Stand: Min guard           General transfer comment: Min guard for safety as pt uses one hand on middle of RW and one on the furniture (both to stand and sit) with no tipping of RW noted    Ambulation/Gait Ambulation/Gait assistance: Supervision Gait Distance (Feet): 180 Feet Assistive device:  Rolling walker (2 wheels) Gait Pattern/deviations: Step-through pattern, Decreased step length - left, Decreased stance time - right       General Gait Details: initial cue for proximity to RW with pt continuing proper position after initial cue   Stairs Stairs: Yes Stairs assistance: Min guard Stair Management: One rail Left, Step to pattern, Forwards Number of Stairs: 8 General stair comments: pt aware of "up with good, down with bad" sequencing from prior to surgery and educated to use rail and have someone carry her walker for her. Shown how to turn walker sideways to carry herself if she needs/wants to.   Wheelchair Mobility    Modified Rankin (Stroke Patients Only)       Balance Overall balance assessment: Needs assistance Sitting-balance support: No upper extremity supported, Feet supported Sitting balance-Leahy Scale: Good     Standing balance support: Bilateral upper extremity supported Standing balance-Leahy Scale: Poor Standing balance comment: Reliant on UE support                            Cognition Arousal/Alertness: Awake/alert Behavior During Therapy: WFL for tasks assessed/performed Overall Cognitive Status: Within Functional Limits for tasks assessed                                          Exercises Total Joint Exercises Ankle Circles/Pumps: AROM, Both, 10 reps Quad Sets: AROM, Right, 10 reps Towel Squeeze: AROM, Right, 10 reps Short Arc Quad: AROM, Right, 10 reps Heel Slides:  AAROM, Right, 10 reps Hip ABduction/ADduction: AROM, Right, 10 reps Straight Leg Raises: AROM, Right, 10 reps Long Arc Quad: AROM, Right, 10 reps Knee Flexion: AROM, Right, 10 reps Goniometric ROM: ~90    General Comments        Pertinent Vitals/Pain Pain Assessment Pain Assessment: 0-10 Pain Score: 5  Pain Location: R knee Pain Descriptors / Indicators: Grimacing, Operative site guarding Pain Intervention(s): Limited activity within  patient's tolerance, Monitored during session, Premedicated before session, Ice applied    Home Living                          Prior Function            PT Goals (current goals can now be found in the care plan section) Acute Rehab PT Goals Patient Stated Goal: to go home Time For Goal Achievement: 08/27/21 Potential to Achieve Goals: Good Progress towards PT goals: Progressing toward goals    Frequency    7X/week      PT Plan Current plan remains appropriate    Co-evaluation              AM-PAC PT "6 Clicks" Mobility   Outcome Measure  Help needed turning from your back to your side while in a flat bed without using bedrails?: None Help needed moving from lying on your back to sitting on the side of a flat bed without using bedrails?: A Little Help needed moving to and from a bed to a chair (including a wheelchair)?: A Little Help needed standing up from a chair using your arms (e.g., wheelchair or bedside chair)?: A Little Help needed to walk in hospital room?: A Little Help needed climbing 3-5 steps with a railing? : A Little 6 Click Score: 19    End of Session   Activity Tolerance: Patient tolerated treatment well Patient left: with call bell/phone within reach;in chair Nurse Communication: Mobility status;Other (comment) (no 2nd PT session needed) PT Visit Diagnosis: Other abnormalities of gait and mobility (R26.89);Difficulty in walking, not elsewhere classified (R26.2);Pain Pain - Right/Left: Right Pain - part of body: Knee     Time: 2060-1561 PT Time Calculation (min) (ACUTE ONLY): 33 min  Charges:  $Gait Training: 8-22 mins $Therapeutic Exercise: 8-22 mins                      [place]  Office [phone]    Rexanne Mano 08/14/2021, 9:22 AM

## 2021-08-14 NOTE — Anesthesia Postprocedure Evaluation (Signed)
Anesthesia Post Note  Patient: Shannon [name]  Procedure(s) Performed: RIGHT TOTAL KNEE ARTHROPLASTY (Right: Knee)     Patient location during evaluation: PACU Anesthesia Type: MAC and Spinal Level of consciousness: oriented and awake and alert Pain management: pain level controlled Vital Signs Assessment: post-procedure vital signs reviewed and stable Respiratory status: spontaneous breathing, respiratory function stable and patient connected to nasal cannula oxygen Cardiovascular status: blood pressure returned to baseline and stable Postop Assessment: no headache, no backache and no apparent nausea or vomiting Anesthetic complications: no   No notable events documented.  Last Vitals:  Vitals:   08/14/21 0400 08/14/21 0743  BP: (!) 119/58 (!) 126/51  Pulse: 62 67  Resp: 18 18  Temp: 36.7 C 36.9 C  SpO2: 98% 98%    Last Pain:  Vitals:   08/14/21 0743  TempSrc: Oral  PainSc:                  [place]

## 2021-08-14 NOTE — Progress Notes (Signed)
Occupational Therapy Evaluation Patient Details Name: Shannon [name] MRN: [id number] DOB: [birth year] Today's Date: 08/14/2021   History of Present Illness Pt is a 71 y/o female admitted secondary to R TKA. PMH includes plantar fasciitis.   Clinical Impression   PTA, pt was independent and lived with her husband. Currently, pt performing LB ADL with Min guard A for safety. Pt educated and demonstrating UB dressing, LB dressing, oral care, walk-in shower transfer, and toilet transfer within precautions this session. Pt pleasant and conversational throughout; all questions answered. Recommend no OT follow up. OT to sign off. Re-consult if change in status.    Recommendations for follow up therapy are one component of a multi-disciplinary discharge planning process, led by the attending physician.  Recommendations may be updated based on patient status, additional functional criteria and insurance authorization.   Follow Up Recommendations  No OT follow up    Assistance Recommended at Discharge Intermittent Supervision/Assistance  Patient can return home with the following Assist for transportation;A little help with bathing/dressing/bathroom;Help with stairs or ramp for entrance    Functional Status Assessment     Equipment Recommendations  None recommended by OT    Recommendations for Other Services       Precautions / Restrictions Precautions Precautions: Knee Precaution Booklet Issued: No Precaution Comments: Verbally reviewed knee precautions. Restrictions Weight Bearing Restrictions: Yes RLE Weight Bearing: Weight bearing as tolerated      Mobility Bed Mobility                    Transfers                          Balance                                           ADL either performed or assessed with clinical judgement   ADL Overall ADL's : Needs assistance/impaired     Grooming: Oral care;Min guard;Standing Grooming  Details (indicate cue type and reason): Min guard A for safety.         Upper Body Dressing : Min guard;Standing Upper Body Dressing Details (indicate cue type and reason): Pt doffing gown, and donning underwear and shirt in standing with min guard A for safety. Encouraged pt to don/doff UBD in sitting for safety purposes when alone. Lower Body Dressing: Min guard;Sit to/from stand Lower Body Dressing Details (indicate cue type and reason): Pt with skill to reach to feet with R knee extended to perform LBD. Pt educated and demonstrating LBD with Min guard A for safety. Pt educated/encouraged to dress operative LE first. Toilet Transfer: Min guard;Ambulation;Rolling walker (2 wheels) Toilet Transfer Details (indicate cue type and reason): Min Guard A for safety. Pt with skill to be mindful of precautions during turning and side stepping when entering restroom.     Tub/ Shower Transfer: Walk-in shower;Min guard;Ambulation;Rolling walker (2 wheels);Shower Scientist, research (medical) Details (indicate cue type and reason): Pt performing walk-in shoer transfer with min Guard A for safety Functional mobility during ADLs: Min guard;Rolling walker (2 wheels) General ADL Comments: Min guard A for safety during LB ADL and tasks performed in standing.     Vision Baseline Vision/History: 1 Wears glasses Vision Assessment?: No apparent visual deficits Additional Comments: Pt reading book on arrival and recognizing distant objects during hallway mobility.  Perception     Praxis      Pertinent Vitals/Pain Pain Assessment Pain Assessment: Faces Faces Pain Scale: Hurts a little bit     Hand Dominance     Extremity/Trunk Assessment Upper Extremity Assessment Upper Extremity Assessment: Overall WFL for tasks assessed   Lower Extremity Assessment Lower Extremity Assessment: Defer to PT evaluation RLE Deficits / Details: Deficits consistent with post op pain and weakness.   Cervical / Trunk  Assessment Cervical / Trunk Assessment: Normal   Communication Communication Communication: No difficulties   Cognition                                       General Comments: Pt performing LB ADL within precautions following initial education     General Comments       Exercises     Shoulder Instructions      Home Living Family/patient expects to be discharged to:: Private residence Living Arrangements: Spouse/significant other Available Help at Discharge: Family Type of Home: House Home Access: Stairs to enter [employer] of Steps: 3, landing and then 2 more Entrance Stairs-Rails: Left (both sides on first set of 3, on the L on the set of 2) Home Layout: Two level;Able to live on main level with bedroom/bathroom     Bathroom Shower/Tub: Occupational psychologist: Standard Bathroom Accessibility: Yes How Accessible: Accessible via walker Home Equipment: BSC/3in1;Shower seat;Rolling Walker (2 wheels);Grab bars - tub/shower   Additional Comments: Pt reports husband available to help as needed      Prior Functioning/Environment Prior Level of Function : Independent/Modified Independent             Mobility Comments: no AD ADLs Comments: Independent prior to admission        OT Problem List: Decreased strength;Decreased range of motion;Impaired balance (sitting and/or standing);Decreased activity tolerance;Pain      OT Treatment/Interventions:      OT Goals(Current goals can be found in the care plan section) Acute Rehab OT Goals Patient Stated Goal: Return to being active and walking for fun OT Goal Formulation: With patient  OT Frequency:      Co-evaluation              AM-PAC OT "6 Clicks" Daily Activity     Outcome Measure Help from another person eating meals?: None Help from another person taking care of personal grooming?: A Little Help from another person toileting, which includes using toliet, bedpan,  or urinal?: A Little Help from another person bathing (including washing, rinsing, drying)?: A Little Help from another person to put on and taking off regular upper body clothing?: A Little Help from another person to put on and taking off regular lower body clothing?: A Little 6 Click Score: 19   End of Session Equipment Utilized During Treatment: Gait belt;Rolling walker (2 wheels) Nurse Communication: Mobility status  Activity Tolerance: Patient tolerated treatment well Patient left: in chair;with call bell/phone within reach  OT Visit Diagnosis: Unsteadiness on feet (R26.81);Muscle weakness (generalized) (M62.81);Pain Pain - Right/Left: Right Pain - part of body: Knee                Time: 5784-6962 OT Time Calculation (min): 21 min Charges:  OT General Charges $OT Visit: 1 Visit OT Evaluation $OT Eval Low Complexity: 1 Low  Shanda Howells, OTR/L [facility] Acute Rehabilitation Office: [phone]   Lula Olszewski 08/14/2021,  9:52 AM

## 2021-08-14 NOTE — Discharge Summary (Signed)
Patient ID: Shannon [name] MRN: [id number] DOB/AGE: 1950/11/29 71 y.o.  Admit date: 08/13/2021 Discharge date: 08/14/2021  Admission Diagnoses:  Principal Problem:   Primary osteoarthritis of right knee Active Problems:   Status post total right knee replacement   Discharge Diagnoses:  Same  Past Medical History:  Diagnosis Date   Arthritis    left foot   Blood transfusion without reported diagnosis    Chronic neck and back pain    received physical therapy   Dysrhythmia    Pt told she had "benign arrythmia" after wearing monitor   GERD (gastroesophageal reflux disease)    with esophagitis   Motion sickness    boats, planes   Numbness    Right outer thigh, constant, notices more with standing for a long period of time   Plantar fasciitis    left   PONV (postoperative nausea and vomiting)     Surgeries: Procedure(s): RIGHT TOTAL KNEE ARTHROPLASTY on 08/13/2021   Consultants:   Discharged Condition: Improved  Hospital Course: Shannon [name] is an 71 y.o. female who was admitted 08/13/2021 for operative treatment ofPrimary osteoarthritis of right knee. Patient has severe unremitting pain that affects sleep, daily activities, and work/hobbies. After pre-op clearance the patient was taken to the operating room on 08/13/2021 and underwent  Procedure(s): RIGHT TOTAL KNEE ARTHROPLASTY.    Patient was given perioperative antibiotics:  Anti-infectives (From admission, onward)    Start     Dose/Rate Route Frequency Ordered Stop   08/13/21 1600  ceFAZolin (ANCEF) IVPB 2g/100 mL premix        2 g 200 mL/hr over 30 Minutes Intravenous Every 6 hours 08/13/21 1146 08/13/21 2143   08/13/21 1017  vancomycin (VANCOCIN) powder  Status:  Discontinued          As needed 08/13/21 1017 08/13/21 1140   08/13/21 0600  ceFAZolin (ANCEF) IVPB 2g/100 mL premix        2 g 200 mL/hr over 30 Minutes Intravenous On call to O.R. 08/13/21 0547 08/13/21 0950        Patient was given  sequential compression devices, early ambulation, and chemoprophylaxis to prevent DVT.  Patient benefited maximally from hospital stay and there were no complications.    Recent vital signs: Patient Vitals for the past 24 hrs:  BP Temp Temp src Pulse Resp SpO2  08/14/21 0743 (!) 126/51 98.5 F (36.9 C) Oral 67 18 98 %  08/14/21 0400 (!) 119/58 98.1 F (36.7 C) Oral 62 18 98 %  08/13/21 2256 120/75 97.9 F (36.6 C) Oral 64 18 100 %  08/13/21 2012 114/64 98.7 F (37.1 C) Oral 67 18 99 %  08/13/21 1639 123/66 97.8 F (36.6 C) -- 69 18 97 %  08/13/21 1339 119/73 97.8 F (36.6 C) -- 62 18 100 %  08/13/21 1315 135/76 -- -- (!) 57 13 94 %  08/13/21 1300 140/75 97.7 F (36.5 C) -- (!) 58 11 95 %  08/13/21 1245 125/74 -- -- 66 17 93 %  08/13/21 1230 116/73 -- -- (!) 59 17 97 %  08/13/21 1215 109/62 -- -- 71 18 96 %  08/13/21 1200 110/71 -- -- 69 20 97 %  08/13/21 1145 129/72 (!) 97.5 F (36.4 C) -- 80 13 96 %     Recent laboratory studies:  Recent Labs    08/14/21 0502  WBC 11.6*  HGB 11.1*  HCT 32.9*  PLT 274     Discharge Medications:   Allergies as  of 08/14/2021       Reactions   Adhesive [tape] Rash   (not sure if rash was from ointment or tape)   Alpha-gal Rash   (red meat S/P tick bite)   Neomycin-bacitracin Zn-polymyx Rash   Ointment   Nickel Rash        Medication List     STOP taking these medications    acetaminophen 500 MG tablet Commonly known as: TYLENOL   acetaminophen 650 MG CR tablet Commonly known as: TYLENOL   naproxen sodium 220 MG tablet Commonly known as: ALEVE       TAKE these medications    aspirin EC 81 MG tablet Take 1 tablet (81 mg total) by mouth 2 (two) times daily. To be taken after surgery to prevent blood clots.  Swallow whole.   cholecalciferol 25 MCG (1000 UNIT) tablet Commonly known as: VITAMIN D3 Take 1,000 Units by mouth daily.   diclofenac Sodium 1 % Gel Commonly known as: VOLTAREN Apply 1 Application  topically 4 (four) times daily as needed (pain).   docusate sodium 100 MG capsule Commonly known as: Colace Take 1 capsule (100 mg total) by mouth daily as needed.   famotidine 20 MG tablet Commonly known as: PEPCID Take 20 mg by mouth daily as needed for heartburn or indigestion.   GAMMA AMINOBUTYRIC ACID PO Take 500 mg by mouth daily.   MAGNESIUM GLYCINATE PO Take 400 mg by mouth daily.   melatonin 5 MG Tabs Take 5 mg by mouth at bedtime.   methocarbamol 750 MG tablet Commonly known as: Robaxin-750 Take 1 tablet (750 mg total) by mouth 2 (two) times daily as needed for muscle spasms.   mirabegron ER 50 MG Tb24 tablet Commonly known as: Myrbetriq Take 1 tablet (50 mg total) by mouth daily.   multivitamin tablet Take 1 tablet by mouth daily.   ondansetron 4 MG tablet Commonly known as: Zofran Take 1 tablet (4 mg total) by mouth every 8 (eight) hours as needed for nausea or vomiting.   oxyCODONE-acetaminophen 5-325 MG tablet Commonly known as: Percocet Take 1-2 tablets by mouth every 6 (six) hours as needed.   PHAZYME GAS & ACID MAX ST PO Take 1 tablet by mouth as needed (bloating).   PUMPKIN SEED OIL PO Take 200 mg by mouth in the morning and at bedtime.   St Johns Wort 300 MG Tabs Take 600 mg by mouth in the morning and at bedtime.               Durable Medical Equipment  (From admission, onward)           Start     Ordered   08/13/21 1330  DME Walker rolling  Once       Question Answer Comment  Walker: With 5 Inch Wheels   Patient needs a walker to treat with the following condition Status post left partial knee replacement      08/13/21 1329   08/13/21 1330  DME 3 n 1  Once        08/13/21 1329   08/13/21 1330  DME Bedside commode  Once       Question:  Patient needs a bedside commode to treat with the following condition  Answer:  Status post left partial knee replacement   08/13/21 1329            Diagnostic Studies: DG Knee Right  Port  Result Date: 08/13/2021 CLINICAL DATA:  Status post right knee replacement  EXAM: PORTABLE RIGHT KNEE - 1-2 VIEW COMPARISON:  03/21/2021 FINDINGS: Status post right total knee arthroplasty. No periprosthetic lucency or fracture. Near anatomic alignment of the prosthetic components. Air within the soft tissues is not unexpected postoperatively. IMPRESSION: Expected postoperative appearance status post right total knee arthroplasty. Electronically Signed   By: Merilyn Baba M.D.   On: 08/13/2021 12:58    Disposition: Discharge disposition: 01-Home or Self Care          Follow-up Information     Leandrew Koyanagi, MD. Schedule an appointment as soon as possible for a visit in 2 week(s).   Specialty: Orthopedic Surgery Contact information: [place] [address] [phone]         [facility] Follow up.   Specialty: [place] Why: SOmeone from [facility] will contact you to arrange start date and time for your therapy. Contact information: [address] [place] [address] [phone]                  Signed: Aundra Dubin 08/14/2021, 8:16 AM

## 2021-08-14 NOTE — Plan of Care (Signed)
Pt doing well. Pt given D/C instructions with verbal understanding. Rx's were sent to the pharmacy by MD. Pt's incision is clean and dry with no sign of infection, dressing was changed prior to D/C per MD order. Pt's IV was removed prior to D/C. Pt D/C'd home via wheelchair per MD order. Pt is stable @ D/C and has no other needs at this time. Holli Humbles, RN

## 2021-08-15 ENCOUNTER — Telehealth: Payer: Self-pay | Admitting: *Deleted

## 2021-08-15 NOTE — Telephone Encounter (Signed)
Ortho bundle D/C call completed. 

## 2021-08-20 ENCOUNTER — Other Ambulatory Visit: Payer: Self-pay | Admitting: Physician Assistant

## 2021-08-20 ENCOUNTER — Telehealth: Payer: Self-pay | Admitting: *Deleted

## 2021-08-20 MED ORDER — TRAMADOL HCL 50 MG PO TABS: 50.0000 mg | ORAL_TABLET | Freq: Three times a day (TID) | ORAL | 2 refills | Status: DC | PRN

## 2021-08-20 NOTE — Telephone Encounter (Signed)
Has she taken her blood pressure?  No chest pain/sob?  She was a little anemic pod#1, but I would not anticipate it having dropped to the point where she would need blood.  If vitals are ok, and she does not have any cp or sob, I would continue to hydrate and take ensure.  I also sent in tramadol

## 2021-08-20 NOTE — Telephone Encounter (Signed)
Patient called and reports she is really having a hard time with extreme fatigue, nausea, dizziness. States she is having a hard time even sitting up to read a book she is so fatigued. She has stopped all Oxycodone at end of last week and is taking Tylenol, but is taking she states 3900 mg of Tylenol daily. No appetite, brain fog. Has had BM, but states she feels absolutely awful. Making progress with function of knee however. I recommended some Ensure and making a smoothie or milkshake with it to give her some added calories and to continue with drinking fluids. Anything else you recommend? Any change to pain medication at this point since Oxycodone also made her feel terrible when taking? Thank you.

## 2021-08-21 NOTE — Telephone Encounter (Signed)
Ok thanks 

## 2021-08-24 ENCOUNTER — Other Ambulatory Visit: Payer: Self-pay | Admitting: Physician Assistant

## 2021-08-24 ENCOUNTER — Telehealth: Payer: Self-pay | Admitting: Orthopaedic Surgery

## 2021-08-24 MED ORDER — PROMETHAZINE HCL 12.5 MG PO TABS: 12.5000 mg | ORAL_TABLET | Freq: Two times a day (BID) | ORAL | 0 refills | Status: DC | PRN

## 2021-08-24 MED ORDER — HYDROCODONE-ACETAMINOPHEN 5-325 MG PO TABS: 1.0000 | ORAL_TABLET | Freq: Three times a day (TID) | ORAL | 0 refills | Status: DC | PRN

## 2021-08-24 NOTE — Telephone Encounter (Signed)
Patient called advised the Tramadol is making her nauseated and dizzy. Patient said she can not keep any food down. Patient asked for a call back as soon as possible. Patient said Jamse Arn has been helping her.  The number to contact patient is [phone]

## 2021-08-24 NOTE — Telephone Encounter (Signed)
Continue with ensure and fluids such as water or gatorade.  I will also send in phergan which I have been hesitant to do since she has been so fatigued.  Please let her know that this will make her tired.

## 2021-08-24 NOTE — Telephone Encounter (Signed)
I sent in norco to try instead

## 2021-08-24 NOTE — Telephone Encounter (Signed)
Called and spoke with patient. Relayed information. She was appreciative. She will let us know if she needs anything else.

## 2021-08-28 ENCOUNTER — Ambulatory Visit (INDEPENDENT_AMBULATORY_CARE_PROVIDER_SITE_OTHER): Payer: Medicare PPO | Admitting: Orthopaedic Surgery

## 2021-08-28 ENCOUNTER — Ambulatory Visit: Payer: Self-pay

## 2021-08-28 ENCOUNTER — Ambulatory Visit
Admission: RE | Admit: 2021-08-28 | Discharge: 2021-08-28 | Disposition: A | Discharge status: Home / Self Care | Payer: Medicare PPO | Source: Ambulatory Visit | Attending: Orthopaedic Surgery | Admitting: Orthopaedic Surgery

## 2021-08-28 ENCOUNTER — Ambulatory Visit (HOSPITAL_COMMUNITY)
Admission: RE | Admit: 2021-08-28 | Discharge: 2021-08-28 | Disposition: A | Discharge status: Home / Self Care | Payer: Medicare PPO | Source: Ambulatory Visit | Attending: Cardiology | Admitting: Cardiology

## 2021-08-28 DIAGNOSIS — Z96651 Presence of right artificial knee joint: Secondary | ICD-10-CM

## 2021-08-28 DIAGNOSIS — R600 Localized edema: Secondary | ICD-10-CM

## 2021-08-28 DIAGNOSIS — R0602 Shortness of breath: Secondary | ICD-10-CM | POA: Diagnosis not present

## 2021-08-28 LAB — EXTRA SPECIMEN

## 2021-08-28 LAB — CBC
HCT: 34.7 % — ABNORMAL LOW (ref 35.0–45.0)
Hemoglobin: 11.3 g/dL — ABNORMAL LOW (ref 11.7–15.5)
MCH: 30.8 pg (ref 27.0–33.0)
MCHC: 32.6 g/dL (ref 32.0–36.0)
MCV: 94.6 fL (ref 80.0–100.0)
MPV: 9.1 fL (ref 7.5–12.5)
Platelets: 448 10*3/uL — ABNORMAL HIGH (ref 140–400)
RBC: 3.67 10*6/uL — ABNORMAL LOW (ref 3.80–5.10)
RDW: 13.9 % (ref 11.0–15.0)
WBC: 6.3 10*3/uL (ref 3.8–10.8)

## 2021-08-28 NOTE — Progress Notes (Unsigned)
Post-Op Visit Note   Patient: Shannon [name]           Date of Birth: 1950-05-28           MRN: [id number] Visit Date: 08/28/2021 PCP: Abner Greenspan, MD   Assessment & Plan:  Chief Complaint:  Chief Complaint  Patient presents with   Right Knee - Routine Post Op   Visit Diagnoses:  1. Status post total right knee replacement     Plan: Patient is 2 weeks status post right total knee on 08/13/2021.  She feels very tired and weak and physically drained.  She is making excellent progress in terms of her knee rehab.  She has had a low-grade temperature last night.  Feels some shortness of breath.  Has not been using the incentive spirometer.  Examination of right knee shows healed surgical incision.  Appropriate amount of swelling and bruising in range of motion for 2 weeks.  Calf is slightly tender.  Based on findings feel that the knee is recovering well.  We will obtain a Doppler to rule out DVT.  I think the main issue is she is having a lot of difficulty with physiologically recovering.  I want to make sure she does not have pneumonia so we will order stat chest x-ray at [facility] imaging.  We will also obtain a CBC to trend her hemoglobin.  44-monthhandicap provided.  Implant card provided.  Wound care instructions provided.  She has an appointment for outpatient PT tomorrow.  Continue with high-protein diet.  We will communicate with her regarding chest x-ray results.  Follow-Up Instructions: Return in about 4 weeks (around 09/25/2021).   Orders:  Orders Placed This Encounter  Procedures   XR Chest 2 View   No orders of the defined types were placed in this encounter.   Imaging: No results found.  PMFS History: Patient Active Problem List   Diagnosis Date Noted   Status post total right knee replacement 08/13/2021   Pre-operative cardiovascular examination 06/10/2021   Pre-op examination 05/31/2021   Primary osteoarthritis of right knee 03/28/2021   Polyp of  colon    History of PSVT (paroxysmal supraventricular tachycardia) 06/17/2017   Recurrent UTI 08/23/2016   Estrogen deficiency 12/06/2015   Elevated blood sugar 12/06/2015   Incontinence of urine in female 04/12/2015   Medicare annual wellness visit, subsequent 11/30/2014   Routine general medical examination at a health care facility 11/05/2012   PLANTAR FASCIITIS, LEFT 07/28/2009   PERSONAL HX COLONIC POLYPS 11/02/2007   GERD 10/20/2006   Past Medical History:  Diagnosis Date   Arthritis    left foot   Blood transfusion without reported diagnosis    Chronic neck and back pain    received physical therapy   Dysrhythmia    Pt told she had "benign arrythmia" after wearing monitor   GERD (gastroesophageal reflux disease)    with esophagitis   Motion sickness    boats, planes   Numbness    Right outer thigh, constant, notices more with standing for a long period of time   Plantar fasciitis    left   PONV (postoperative nausea and vomiting)     Family History  Problem Relation Age of Onset   Stroke Mother        x 2   Heart disease Mother        congenital arrhythmia and CHF   Depression Mother    Atrial fibrillation Mother    Stomach cancer  Paternal Uncle    Prostate cancer Brother    Atrial fibrillation Brother    Colon cancer Neg Hx    Esophageal cancer Neg Hx    Kidney cancer Neg Hx    Bladder Cancer Neg Hx    Breast cancer Neg Hx     Past Surgical History:  Procedure Laterality Date   ARTHRODESIS METATARSAL Left 12/30/2018   Procedure: ARTHRODESIS,LISFRANC;MULTIPLE LEFT;  Surgeon: Samara Deist, DPM;  Location: [place];  Service: Podiatry;  Laterality: Left;  general with local   BLADDER SUSPENSION N/A 07/04/2015   Procedure: TRANSVAGINAL TAPE (TVT) SLING                   ;  Surgeon: Emily Filbert, MD;  Location: [facility];  Service: Gynecology;  Laterality: N/A;   COLONOSCOPY     COLONOSCOPY WITH PROPOFOL N/A 09/08/2019   Procedure: COLONOSCOPY WITH  PROPOFOL;  Surgeon: Virgel Manifold, MD;  Location: [facility];  Service: Endoscopy;  Laterality: N/A;   CYSTOCELE REPAIR N/A 07/04/2015   Procedure: ANTERIOR REPAIR (CYSTOCELE);  Surgeon: Emily Filbert, MD;  Location: [facility];  Service: Gynecology;  Laterality: N/A;   CYSTOSCOPY N/A 07/04/2015   Procedure: CYSTOSCOPY;  Surgeon: Emily Filbert, MD;  Location: [facility];  Service: Gynecology;  Laterality: N/A;   DILATION AND CURETTAGE OF UTERUS     EXCISION MORTON'S NEUROMA Left 08/12/2018   Procedure: EXCISION MORTON'S NEUROMA;  Surgeon: Samara Deist, DPM;  Location: [place];  Service: Podiatry;  Laterality: Left;  lma local   POLYPECTOMY     TOTAL KNEE ARTHROPLASTY Right 08/13/2021   Procedure: RIGHT TOTAL KNEE ARTHROPLASTY;  Surgeon: Leandrew Koyanagi, MD;  Location: [place];  Service: Orthopedics;  Laterality: Right;   TUBAL LIGATION  1987   UPPER GI ENDOSCOPY     VAGINAL HYSTERECTOMY N/A 07/04/2015   Procedure: TOTAL VAGINAL HYSTERECTOMY  ;  Surgeon: Emily Filbert, MD;  Location: [place] [facility];  Service: Gynecology;  Laterality: N/A;   Social History   Occupational History   Not on file  Tobacco Use   Smoking status: Never   Smokeless tobacco: Never  Vaping Use   Vaping Use: Never used  Substance and Sexual Activity   Alcohol use: No    Alcohol/week: 0.0 standard drinks of alcohol    Comment: rare   Drug use: No   Sexual activity: Not Currently    Birth control/protection: Post-menopausal, Surgical

## 2021-08-29 ENCOUNTER — Telehealth: Payer: Self-pay | Admitting: *Deleted

## 2021-08-29 ENCOUNTER — Encounter: Payer: Self-pay | Admitting: Orthopaedic Surgery

## 2021-08-29 ENCOUNTER — Ambulatory Visit: Payer: Medicare PPO | Attending: Orthopaedic Surgery | Admitting: Physical Therapy

## 2021-08-29 ENCOUNTER — Encounter: Payer: Self-pay | Admitting: Physical Therapy

## 2021-08-29 DIAGNOSIS — G8929 Other chronic pain: Secondary | ICD-10-CM | POA: Diagnosis not present

## 2021-08-29 DIAGNOSIS — M25561 Pain in right knee: Secondary | ICD-10-CM | POA: Diagnosis not present

## 2021-08-29 DIAGNOSIS — Z96651 Presence of right artificial knee joint: Secondary | ICD-10-CM | POA: Diagnosis not present

## 2021-08-29 DIAGNOSIS — R262 Difficulty in walking, not elsewhere classified: Secondary | ICD-10-CM | POA: Diagnosis not present

## 2021-08-29 DIAGNOSIS — M25661 Stiffness of right knee, not elsewhere classified: Secondary | ICD-10-CM | POA: Diagnosis not present

## 2021-08-29 NOTE — Therapy (Signed)
OUTPATIENT PHYSICAL THERAPY EVALUATION   Patient Name: Shannon [name] MRN: [id number] DOB:[birth year], 71 y.o., female Today's Date: 08/29/2021   PT End of Session - 08/29/21 1310     Visit Number 1    Number of Visits 24    Date for PT Re-Evaluation 11/21/21    Authorization Type [employer] MEDICARE reporting period from 08/29/2021    Authorization Time Period auth 24 visits   8/9 - 11/30    Authorization - Visit Number 1    Authorization - Number of Visits 24    Progress Note Due on Visit 10    PT Start Time 1115    PT Stop Time 1200    PT Time Calculation (min) 45 min    Activity Tolerance Patient tolerated treatment well    Behavior During Therapy WFL for tasks assessed/performed             Past Medical History:  Diagnosis Date   Arthritis    left foot   Blood transfusion without reported diagnosis    Chronic neck and back pain    received physical therapy   Dysrhythmia    Pt told she had "benign arrythmia" after wearing monitor   GERD (gastroesophageal reflux disease)    with esophagitis   Motion sickness    boats, planes   Numbness    Right outer thigh, constant, notices more with standing for a long period of time   Plantar fasciitis    left   PONV (postoperative nausea and vomiting)    Past Surgical History:  Procedure Laterality Date   ARTHRODESIS METATARSAL Left 12/30/2018   Procedure: ARTHRODESIS,LISFRANC;MULTIPLE LEFT;  Surgeon: Samara Deist, DPM;  Location: [place];  Service: Podiatry;  Laterality: Left;  general with local   BLADDER SUSPENSION N/A 07/04/2015   Procedure: TRANSVAGINAL TAPE (TVT) SLING                   ;  Surgeon: Emily Filbert, MD;  Location: [place] [facility];  Service: Gynecology;  Laterality: N/A;   COLONOSCOPY     COLONOSCOPY WITH PROPOFOL N/A 09/08/2019   Procedure: COLONOSCOPY WITH PROPOFOL;  Surgeon: Virgel Manifold, MD;  Location: [facility];  Service: Endoscopy;  Laterality: N/A;   CYSTOCELE REPAIR N/A 07/04/2015    Procedure: ANTERIOR REPAIR (CYSTOCELE);  Surgeon: Emily Filbert, MD;  Location: [place] [facility];  Service: Gynecology;  Laterality: N/A;   CYSTOSCOPY N/A 07/04/2015   Procedure: CYSTOSCOPY;  Surgeon: Emily Filbert, MD;  Location: [place] [facility];  Service: Gynecology;  Laterality: N/A;   DILATION AND CURETTAGE OF UTERUS     EXCISION MORTON'S NEUROMA Left 08/12/2018   Procedure: EXCISION MORTON'S NEUROMA;  Surgeon: Samara Deist, DPM;  Location: [place];  Service: Podiatry;  Laterality: Left;  lma local   POLYPECTOMY     TOTAL KNEE ARTHROPLASTY Right 08/13/2021   Procedure: RIGHT TOTAL KNEE ARTHROPLASTY;  Surgeon: Leandrew Koyanagi, MD;  Location: [place];  Service: Orthopedics;  Laterality: Right;   TUBAL LIGATION  1987   UPPER GI ENDOSCOPY     VAGINAL HYSTERECTOMY N/A 07/04/2015   Procedure: TOTAL VAGINAL HYSTERECTOMY  ;  Surgeon: Emily Filbert, MD;  Location: [facility];  Service: Gynecology;  Laterality: N/A;   Patient Active Problem List   Diagnosis Date Noted   Status post total right knee replacement 08/13/2021   Pre-operative cardiovascular examination 06/10/2021   Pre-op examination 05/31/2021   Primary osteoarthritis of right knee 03/28/2021   Polyp of  colon    History of PSVT (paroxysmal supraventricular tachycardia) 06/17/2017   Recurrent UTI 08/23/2016   Estrogen deficiency 12/06/2015   Elevated blood sugar 12/06/2015   Incontinence of urine in female 04/12/2015   Medicare annual wellness visit, subsequent 11/30/2014   Routine general medical examination at a health care facility 11/05/2012   PLANTAR FASCIITIS, LEFT 07/28/2009   PERSONAL HX COLONIC POLYPS 11/02/2007   GERD 10/20/2006    PCP:  Abner Greenspan, MD  REFERRING PROVIDER: Marylynn Pearson. Erlinda Hong, MD  REFERRING DIAG: status post total right knee replacement  THERAPY DIAG:  Chronic pain of right knee  Stiffness of right knee, not elsewhere classified  Difficulty in walking, not elsewhere classified  Rationale for Evaluation and  Treatment: Rehabilitation  ONSET DATE: s/p R TKA 08/13/2021  SUBJECTIVE:   SUBJECTIVE STATEMENT: Patient reports her right knee pain started last September when she thinks she tore her meniscus. She was doing some seated spinal twists and it did not hurt or feel bad while doing it but she could not get up an hour later. She states she had some PT that helped temporarily but it did not stay better. TKA was recommended after MRI showed meniscus tear and bakers cyst and degeneration of the joint. She delayed the surgery to be able to get through some trips. She states since the surgery her knee has been doing well. She has had a lot of trouble with nausea and fatigue since surgery. She is having a hard time tolerating any pain medications or tylenol/ibuprofen. She has a CPM machine she is supposed to use 6 hours a day. She will have this for another week. She has had home PT that was discharged on Monday. Her PT was pleased with the amount of flexion she achieved and wanted her to work more on extension. She uses the furnature for support at home and she is here with her SPC. She used her RW yesterday for her post op since she felt dizzy while on Zofran. She had an ultrasound for possible DVT and chest xray to rule out pneumonia. Both were negative. She had a low grade fever on Monday night. Her other knee seems to be fine.   PERTINENT HISTORY: Patient is a 71 y.o. female who presents to outpatient physical therapy with a referral for medical diagnosis status post total right knee replacement. This patient's chief complaints consist of right knee pain, stiffness, and weakness and difficulty with walking leading to the following functional deficits: difficulty with all household and community ambulation, kneeling, squatting, going outside, hiking, housework, stairs, etc. Relevant past medical history and comorbidities include arthritis, chronic neck and back pain, GERD, motion sickness, numbness at right outer  thigh, plantar fasciitis left foot, PONY, left metatarsal arthrodesis for lisfranc fracture (2020), bladder suspension, cystocele repair, excision of morton's neuroma 2020, polypectomy.  Patient denies hx of cancer, stroke, seizures, lung problems, heart problems, diabetes, unexplained weight loss, unexplained changes in bowel or bladder problems, unexplained stumbling or dropping things, osteoporosis, and spinal surgery  PAIN:  Are you having pain? Yes: NPRS scale: Current: 2/10,  Best: 2/10, Worst: 10/10. Pain location: R medial anterior knee Pain description: dull ache (after tylenol); sharp stabbing focal pain at medial knee.  Aggravating factors: bending or straightening it too far Relieving factors: ice, rest, moving it some, tylenol  FUNCTIONAL LIMITATIONS: difficulty with all household and community ambulation, kneeling, squatting, going outside, hiking, housework, stairs, etc.   PRECAUTIONS: Fall, don't put pillow behind knee while  resting.   WEIGHT BEARING RESTRICTIONS No  FALLS:  Has patient fallen in last 6 months? No  LIVING ENVIRONMENT: Lives with: lives with her husband and cat Lives in: House/apartment Stairs: 5 steps to enter with left handrail, 2 stories with 13 steps to 2nd floor with B handrails and both can be reached. She has been able to stay on first floor but is planning to move to 2nd floor when CPM machine is gone.  Has following equipment at home: Single point cane, Walker - 2 wheeled, and raised toilet, walk in shower with seat.   OCCUPATION: retired Psychologist, clinical  LEISURE: Hiking  PLOF: [place] "to restore as much function as I possibly can in that knee so I can walk normally and pain free so I can do the things I like to do" "so I can hike and do ADLs without worrying about it" She has a goal of going back to the same [place] she went to [place] year and hike the whole trail, 2 miles each way.    OBJECTIVE  DIAGNOSTIC  FINDINGS:  R knee MRI report from 03/23/2021:  EXAM: MRI OF THE RIGHT KNEE WITHOUT CONTRAST   TECHNIQUE: Multiplanar, multisequence MR imaging of the knee was performed. No intravenous contrast was administered.   COMPARISON:  None.   FINDINGS: MENISCI   Medial: Intact.   Lateral: Maceration of the anterior horn of the lateral meniscus. Small radial tear of the free edge of the anterior horn-body junction of the lateral meniscus.   LIGAMENTS   Cruciates: ACL and PCL are intact.   Collaterals: Medial collateral ligament is intact. Lateral collateral ligament complex is intact.   CARTILAGE   Patellofemoral: Partial-thickness cartilage loss of the patellofemoral compartment.   Medial: Mild partial-thickness cartilage loss of the medial femorotibial compartment.   Lateral: High-grade partial-thickness cartilage loss of the lateral femorotibial compartment with areas of full-thickness cartilage loss. Subchondral reactive marrow edema of the lateral femorotibial compartment.   JOINT: Large joint effusion. Normal Hoffa's fat-pad. No plical thickening.   POPLITEAL FOSSA: Popliteus tendon is intact. Moderate Baker's cyst.   EXTENSOR MECHANISM: Intact quadriceps tendon. Intact patellar tendon. Intact lateral patellar retinaculum. Intact medial patellar retinaculum. Intact MPFL.   BONES: No aggressive osseous lesion. No fracture or dislocation.   Other: No fluid collection or hematoma. Muscles are normal.   IMPRESSION: 1. Maceration of the anterior horn of the lateral meniscus. Small radial tear of the free edge of the anterior horn-body junction of the lateral meniscus. 2. Tricompartmental cartilage abnormalities as described above, most severe in the lateral femorotibial compartment. 3. Large joint effusion. 4. Moderate Baker's cyst.     Electronically Signed   By: Kathreen Devoid M.D.   On: 03/23/2021 14:11  SELF- REPORTED FUNCTION FOTO score: 52/100 (knee  questionnaire)  OBSERVATION/INSPECTION Anthropometrics Tremor: none Body composition: BMI 24 Muscle bulk: slightly less bulk in R calf compared to L.  Skin: The incision sites appear to be healing well with no excessive redness, warmth, drainage or signs of infection present.   Edema: present at right knee, lower leg, and ankle with diffuse bruising WNL following surgery.  Functional Mobility Bed mobility: supine <> sit and rolling mod I for increased effort/time.  Transfers: sit <> stand mod I for use of B UE, increased effort and time.  Gait: ambulates with no AD with slight antalgic gait favoring R LE, lacks R knee flexion and extension, bilateral toe in noted.  PERIPHERAL JOINT MOTION (  in degrees)  ACTIVE RANGE OF MOTION (AROM) *Indicates pain 08/29/21 Date Date  Joint/Motion R/L R/L R/L  Knee     Extension 0/15 / /  Comments: B hips, ankles and L knee appear [facility] for basic mobility.   PASSIVE RANGE OF MOTION (PROM) *Indicates pain 08/29/21 Date Date  Joint/Motion R/L R/L R/L  Knee     Extension 0/11 / /  Flexoin 105/150 / /  Comments:   MUSCLE PERFORMANCE (MMT):  *Indicates pain 08/29/21 Date Date  Joint/Motion R/L R/L R/L  Hip     Flexion (L1, L2) 4/4+ / /  Abduction 5/4+* / /  Knee     Extension (L3) 5*/5 / /  Flexion (S2) 4*/5 / /  Ankle/Foot     Dorsiflexion (L4) 5*/5 / /  Great toe extension (L5) 4+/4+ / /  Eversion (S1) 5/5 / /  Plantarflexion (S1) 4+/4+ / /  Comments:   FUNCTIONAL/BALANCE TESTS: Five Time Sit to Stand (5TSTS): 15 seconds from 18.5 inch plinth, with B UE support on plinth, weight shifted to left side.   Six Minute Walk Test (6MWT): 1133 feet with no AD, antalgic gait favoring R LE, B toe in.     TODAY'S TREATMENT: education  PATIENT EDUCATION:  Education details: Importance of continuing HEP, keeping pillows from behind right knee. Education on diagnosis, prognosis, POC, anatomy and physiology of current condition  Person educated:  Patient Education method: Explanation Education comprehension: verbalized understanding and needs further education   HOME EXERCISE PROGRAM: TBD  ASSESSMENT:  CLINICAL IMPRESSION: Patient is a 71 y.o. female referred to outpatient physical therapy with a medical diagnosis of status post total right knee replacement who presents with signs and symptoms consistent with right knee pain, stiffness, and dysfunction s/p R TKA on 08/13/2021. Patient presents with significant pain, joint stiffness, ROM, balance, gait, muscle performance (strength/power/endurance), edema, and activity tolerance impairments that are limiting ability to complete all household and community ambulation, kneeling, squatting, going outside, hiking, housework, stairs, etc, without difficulty. Patient will benefit from skilled physical therapy intervention to address current body structure impairments and activity limitations to improve function and work towards goals set in current POC in order to return to prior level of function or maximal functional improvement.   OBJECTIVE IMPAIRMENTS Abnormal gait, decreased activity tolerance, decreased balance, decreased endurance, decreased knowledge of condition, decreased mobility, difficulty walking, decreased ROM, decreased strength, hypomobility, increased edema, impaired perceived functional ability, impaired flexibility, impaired sensation, improper body mechanics, and pain.   ACTIVITY LIMITATIONS carrying, lifting, bending, standing, squatting, stairs, transfers, bed mobility, bathing, dressing, and locomotion level  PARTICIPATION LIMITATIONS: meal prep, cleaning, laundry, interpersonal relationship, driving, shopping, community activity, yard work, and   all household and community ambulation, kneeling, squatting, going outside, hiking, housework, stairs, etc  PERSONAL FACTORS Age, Past/current experiences, Time since onset of injury/illness/exacerbation, and 3+ comorbidities:    arthritis, chronic neck and back pain, GERD, motion sickness, numbness at right outer thigh, plantar fasciitis left foot, PONY, left metatarsal arthrodesis for lisfranc fracture (2020), bladder suspension, cystocele repair, excision of morton's neuroma 2020, polypectomy are also affecting patient's functional outcome.   REHAB POTENTIAL: Good  CLINICAL DECISION MAKING: Stable/uncomplicated  EVALUATION COMPLEXITY: Low   GOALS: Goals reviewed with patient? No  SHORT TERM GOALS: Target date: 09/12/2021  Patient will be independent with initial home exercise program for self-management of symptoms. Baseline: Initial HEP to be provided at visit 2 as appropriate (08/29/21); Goal status: INITIAL   LONG TERM GOALS: Target  date: 11/21/2021  Patient will be independent with a long-term home exercise program for self-management of symptoms.  Baseline: Initial HEP to be provided at visit 2 as appropriate (08/29/21); Goal status: INITIAL  2.  Patient will demonstrate improved FOTO to equal or greater than 64 by visit #15 to demonstrate improvement in overall condition and self-reported functional ability.  Baseline: 52 (08/29/21); Goal status: INITIAL  3.  Patient will ambulate equal or greater than 1500 feet with no AD and with normal gait pattern on 6 Minute Walk Test.  Baseline: 1133 feet with no AD, antalgic gait favoring R LE, B toe in.   (08/29/21); Goal status: INITIAL  4.  Patient will complete 5 Times Sit To Stand in equal or less than 11 seconds in surface 18.5 inches or lower with no UE support and equal weight bearing to demonstrate improved ability to complete transfers and demonstrate decreased fall risk.  Baseline: 15 seconds from 18.5 inch plinth, with B UE support on plinth, weight shifted to left side. (08/29/21); Goal status: INITIAL  5.  Patient will complete community, work and/or recreational activities without limitation due to current condition.  Baseline: difficulty  with all household and community ambulation, kneeling, squatting, going outside, hiking, housework, stairs, etc (08/29/21); Goal status: INITIAL  PLAN: PT FREQUENCY: 1-2x/week  PT DURATION: 12 weeks  PLANNED INTERVENTIONS: Therapeutic exercises, Therapeutic activity, Neuromuscular re-education, Balance training, Gait training, Patient/Family education, Self Care, Joint mobilization, Stair training, DME instructions, Dry Needling, Electrical stimulation, Spinal manipulation, Cryotherapy, Moist heat, scar mobilization, Manual therapy, and Re-evaluation  PLAN FOR NEXT SESSION: update HEP as appropriate, progressive LE and functional strengthening, R knee ROM, and balance exercises. Manual as appropriate.    Everlean Alstrom. Graylon Good, PT, DPT 08/29/21, 1:46 PM  [facility] [address] [place], [place] [address] P: [phone] I F: [phone]

## 2021-08-29 NOTE — Telephone Encounter (Signed)
It is very typical for h&h to drop after surgery, and I am not concerned with her numbers.  We would typically only transfuse with blood if less than 8.0 and symptomatic.  I doubt she will drop more at this point and it will take some time for it to go back up.  For now, she can try getting some otc iron supplements which may help speed things up

## 2021-08-29 NOTE — Telephone Encounter (Signed)
Ortho bundle 14 day in office meeting completed on 08/28/21.

## 2021-09-05 ENCOUNTER — Ambulatory Visit: Payer: Medicare PPO | Admitting: Physical Therapy

## 2021-09-05 ENCOUNTER — Encounter: Payer: Self-pay | Admitting: Physical Therapy

## 2021-09-05 DIAGNOSIS — R262 Difficulty in walking, not elsewhere classified: Secondary | ICD-10-CM

## 2021-09-05 DIAGNOSIS — M25661 Stiffness of right knee, not elsewhere classified: Secondary | ICD-10-CM | POA: Diagnosis not present

## 2021-09-05 DIAGNOSIS — G8929 Other chronic pain: Secondary | ICD-10-CM | POA: Diagnosis not present

## 2021-09-05 DIAGNOSIS — M25561 Pain in right knee: Secondary | ICD-10-CM | POA: Diagnosis not present

## 2021-09-05 DIAGNOSIS — Z96651 Presence of right artificial knee joint: Secondary | ICD-10-CM | POA: Diagnosis not present

## 2021-09-05 NOTE — Therapy (Signed)
OUTPATIENT PHYSICAL THERAPY TREATMENT NOTE   Patient Name: Shannon [name] MRN: [id number] DOB:1950-11-03, 71 y.o., female Today's Date: 09/05/2021  PCP: Abner Greenspan, MD REFERRING PROVIDER: Marylynn Pearson. Erlinda Hong, MD  END OF SESSION:   PT End of Session - 09/05/21 1532     Visit Number 2    Number of Visits 24    Date for PT Re-Evaluation 11/21/21    Authorization Type [employer]IC[employer] reporting period from 08/29/2021    Authorization Time Period auth 24 visits   8/9 - 11/30    Authorization - Visit Number 2    Authorization - Number of Visits 24    Progress Note Due on Visit 10    PT Start Time 1435    PT Stop Time 1515    PT Time Calculation (min) 40 min    Activity Tolerance Patient tolerated treatment well    Behavior During Therapy WFL for tasks assessed/performed             Past Medical History:  Diagnosis Date   Arthritis    left foot   Blood transfusion without reported diagnosis    Chronic neck and back pain    received physical therapy   Dysrhythmia    Pt told she had "benign arrythmia" after wearing monitor   GERD (gastroesophageal reflux disease)    with esophagitis   Motion sickness    boats, planes   Numbness    Right outer thigh, constant, notices more with standing for a long period of time   Plantar fasciitis    left   PONV (postoperative nausea and vomiting)    Past Surgical History:  Procedure Laterality Date   ARTHRODESIS METATARSAL Left 12/30/2018   Procedure: ARTHRODESIS,LISFRANC;MULTIPLE LEFT;  Surgeon: Samara Deist, DPM;  Location: [place];  Service: Podiatry;  Laterality: Left;  general with local   BLADDER SUSPENSION N/A 07/04/2015   Procedure: TRANSVAGINAL TAPE (TVT) SLING                   ;  Surgeon: Emily Filbert, MD;  Location: [facility];  Service: Gynecology;  Laterality: N/A;   COLONOSCOPY     COLONOSCOPY WITH PROPOFOL N/A 09/08/2019   Procedure: COLONOSCOPY WITH PROPOFOL;  Surgeon: Virgel Manifold, MD;  Location: [facility]  ENDOSCOPY;  Service: Endoscopy;  Laterality: N/A;   CYSTOCELE REPAIR N/A 07/04/2015   Procedure: ANTERIOR REPAIR (CYSTOCELE);  Surgeon: Emily Filbert, MD;  Location: [facility];  Service: Gynecology;  Laterality: N/A;   CYSTOSCOPY N/A 07/04/2015   Procedure: CYSTOSCOPY;  Surgeon: Emily Filbert, MD;  Location: [facility];  Service: Gynecology;  Laterality: N/A;   DILATION AND CURETTAGE OF UTERUS     EXCISION MORTON'S NEUROMA Left 08/12/2018   Procedure: EXCISION MORTON'S NEUROMA;  Surgeon: Samara Deist, DPM;  Location: [facility];  Service: Podiatry;  Laterality: Left;  lma local   POLYPECTOMY     TOTAL KNEE ARTHROPLASTY Right 08/13/2021   Procedure: RIGHT TOTAL KNEE ARTHROPLASTY;  Surgeon: Leandrew Koyanagi, MD;  Location: [facility];  Service: Orthopedics;  Laterality: Right;   TUBAL LIGATION  1987   UPPER GI ENDOSCOPY     VAGINAL HYSTERECTOMY N/A 07/04/2015   Procedure: TOTAL VAGINAL HYSTERECTOMY  ;  Surgeon: Emily Filbert, MD;  Location: [facility];  Service: Gynecology;  Laterality: N/A;   Patient Active Problem List   Diagnosis Date Noted   Status post total right knee replacement 08/13/2021   Pre-operative cardiovascular examination 06/10/2021  Pre-op examination 05/31/2021   Primary osteoarthritis of right knee 03/28/2021   Polyp of colon    History of PSVT (paroxysmal supraventricular tachycardia) 06/17/2017   Recurrent UTI 08/23/2016   Estrogen deficiency 12/06/2015   Elevated blood sugar 12/06/2015   Incontinence of urine in female 04/12/2015   Medicare annual wellness visit, subsequent 11/30/2014   Routine general medical examination at a health care facility 11/05/2012   PLANTAR FASCIITIS, LEFT 07/28/2009   PERSONAL HX COLONIC POLYPS 11/02/2007   GERD 10/20/2006    REFERRING DIAG: status post total right knee replacement  THERAPY DIAG:  Chronic pain of right knee  Stiffness of right knee, not elsewhere classified  Difficulty in walking, not elsewhere classified  Rationale for  Evaluation and Treatment: Rehabilitation  PERTINENT HISTORY: Patient is a 71 y.o. female who presents to outpatient physical therapy with a referral for medical diagnosis status post total right knee replacement. This patient's chief complaints consist of right knee pain, stiffness, and weakness and difficulty with walking leading to the following functional deficits: difficulty with all household and community ambulation, kneeling, squatting, going outside, hiking, housework, stairs, etc. Relevant past medical history and comorbidities include arthritis, chronic neck and back pain, GERD, motion sickness, numbness at right outer thigh, plantar fasciitis left foot, PONY, left metatarsal arthrodesis for lisfranc fracture (2020), bladder suspension, cystocele repair, excision of morton's neuroma 2020, polypectomy.  Patient denies hx of cancer, stroke, seizures, lung problems, heart problems, diabetes, unexplained weight loss, unexplained changes in bowel or bladder problems, unexplained stumbling or dropping things, osteoporosis, and spinal surgery    PRECAUTIONS: Fall, don't put pillow behind knee while resting.   SUBJECTIVE: Patient arrives with no UE support. She states her CPM machine was picked up on Monday. She reports she started getting sharp pains in her right groin on Monday when she moves wrong. She feels they are coming form her knee. She states her R knee is achy and sore at times. She is concerned about her ability to straighten it but otherwise feels her HEP is going well. She denies every having trouble with her right hip before. She states her fatigue continues to bother her. She continues to get lightheaded when standing too long. She started taking an iron supplement and she does feel a little bit better today.    PAIN:  Are you having pain? 3/10 in the right groin and knee.    OBJECTIVE:   R knee PROM (closed chain) Flexion: 124 after stretching   TODAY'S TREATMENT: Therapeutic  exercise: to centralize symptoms and improve ROM, strength, muscular endurance, and activity tolerance required for successful completion of functional activities.  - NuStep level 4 using bilateral upper and lower extremities. Seat/handle setting 11/11. For improved extremity mobility, muscular endurance, and activity tolerance; and to induce the analgesic effect of aerobic exercise, stimulate improved joint nutrition, and prepare body structures and systems for following interventions. x 6  minutes. Average SPM = 57. Moved seat up to stretch knee but it caused a lot of pain at the hip so discontinued.  - Step standing R knee flexion stretch at stairs, 1x20 with 5 second holds.  - seated R knee extension stretch with self OP 1x10 with 5 second hold (difficulty with angle of overpressure).  - seated R hamstring curl with heel on rolling stool, rolling it towards body and away, with 5 second hold in extension with 5# AW on top of knee to provide resistance and encourage stretch, facilitated by PT. 1x10.  -  standing squat with B UE support focusing on shifting weight towards R LE and squatting deeper than mini squat, 1x10 - standing R TKE with GTB, 1x20 with 5 second hold.  - supine R hamstring stretch with strap, 3x30 seconds, concentrating on knee extension.  - supine/long sitting lateral patellar glides demonstrated by PT, then completed by patient.  - Education on HEP including handout   Pt required multimodal cuing for proper technique and to facilitate improved neuromuscular control, strength, range of motion, and functional ability resulting in improved performance and form.    PATIENT EDUCATION:  Education details: Exercise purpose/form. Self management techniques. HEP including handout   Person educated: Patient Education method: Explanation, demonstration, tactile and verbal cuing Education comprehension: verbalized and demonstrated understanding and needs further education     HOME  EXERCISE PROGRAM: Access Code: [email or web address] URL: [email or web address] Date: 09/05/2021 Prepared by: Rosita Kea  Exercises - Standing Knee Flexion Stretch on Step  - 2 x daily - 1 sets - 20 reps - 5 seconds hold - Squat with Counter Support  - 2 x daily - 2 sets - 10 reps - Standing Terminal Knee Extension with Resistance  - 2 x daily - 1 sets - 20 reps - 5 seconds hold - Hamstring stretch (with strap)  - 2 x daily - 3 sets - 30 seconds hold - Supine Knee Extension Mobilization with Weight  - 2 x daily - 3 reps - 1-5 min hold - Sitting Lateral Patellar Glide  - 1 x daily - 20 reps   ASSESSMENT:   CLINICAL IMPRESSION: Patient arrives with no AD and significant antalgic gait favoring R LE. Continues to report difficulty with fatigue and lightheadedness. Patient encouraged to contact MD if this is not getting better. Progressed stretching and strengthening exercise and updated HEP this session as tolerated. Patient has good flexion ROM at this time but is struggling more with strength, gait,  and extension ROM. Patient would benefit from continued management of limiting condition by skilled physical therapist to address remaining impairments and functional limitations to work towards stated goals and return to PLOF or maximal functional independence.   Patient is a 71 y.o. female referred to outpatient physical therapy with a medical diagnosis of status post total right knee replacement who presents with signs and symptoms consistent with right knee pain, stiffness, and dysfunction s/p R TKA on 08/13/2021. Patient presents with significant pain, joint stiffness, ROM, balance, gait, muscle performance (strength/power/endurance), edema, and activity tolerance impairments that are limiting ability to complete all household and community ambulation, kneeling, squatting, going outside, hiking, housework, stairs, etc, without difficulty. Patient will benefit from skilled physical therapy  intervention to address current body structure impairments and activity limitations to improve function and work towards goals set in current POC in order to return to prior level of function or maximal functional improvement.    OBJECTIVE IMPAIRMENTS Abnormal gait, decreased activity tolerance, decreased balance, decreased endurance, decreased knowledge of condition, decreased mobility, difficulty walking, decreased ROM, decreased strength, hypomobility, increased edema, impaired perceived functional ability, impaired flexibility, impaired sensation, improper body mechanics, and pain.    ACTIVITY LIMITATIONS carrying, lifting, bending, standing, squatting, stairs, transfers, bed mobility, bathing, dressing, and locomotion level   PARTICIPATION LIMITATIONS: meal prep, cleaning, laundry, interpersonal relationship, driving, shopping, community activity, yard work, and   all household and community ambulation, kneeling, squatting, going outside, hiking, housework, stairs, etc   PERSONAL FACTORS Age, Past/current experiences, Time since onset of injury/illness/exacerbation, and 3+ comorbidities:   arthritis,  chronic neck and back pain, GERD, motion sickness, numbness at right outer thigh, plantar fasciitis left foot, PONY, left metatarsal arthrodesis for lisfranc fracture (2020), bladder suspension, cystocele repair, excision of morton's neuroma 2020, polypectomy are also affecting patient's functional outcome.    REHAB POTENTIAL: Good   CLINICAL DECISION MAKING: Stable/uncomplicated   EVALUATION COMPLEXITY: Low     GOALS: Goals reviewed with patient? No   SHORT TERM GOALS: Target date: 09/12/2021   Patient will be independent with initial home exercise program for self-management of symptoms. Baseline: Initial HEP to be provided at visit 2 as appropriate (08/29/21); initial HEP provided at visit 2 (09/05/2021);  Goal status: In-progress     LONG TERM GOALS: Target date: 11/21/2021   Patient  will be independent with a long-term home exercise program for self-management of symptoms.  Baseline: Initial HEP to be provided at visit 2 as appropriate (08/29/21);  initial HEP provided at visit 2 (09/05/2021);  Goal status: In-progress   2.  Patient will demonstrate improved FOTO to equal or greater than 64 by visit #15 to demonstrate improvement in overall condition and self-reported functional ability.  Baseline: 52 (08/29/21); Goal status: In-progress   3.  Patient will ambulate equal or greater than 1500 feet with no AD and with normal gait pattern on 6 Minute Walk Test.  Baseline: 1133 feet with no AD, antalgic gait favoring R LE, B toe in.   (08/29/21); Goal status: In-progress   4.  Patient will complete 5 Times Sit To Stand in equal or less than 11 seconds in surface 18.5 inches or lower with no UE support and equal weight bearing to demonstrate improved ability to complete transfers and demonstrate decreased fall risk.  Baseline: 15 seconds from 18.5 inch plinth, with B UE support on plinth, weight shifted to left side. (08/29/21); Goal status: In-progress   5.  Patient will complete community, work and/or recreational activities without limitation due to current condition.  Baseline: difficulty with all household and community ambulation, kneeling, squatting, going outside, hiking, housework, stairs, etc (08/29/21); Goal status: In-progress   PLAN: PT FREQUENCY: 1-2x/week   PT DURATION: 12 weeks   PLANNED INTERVENTIONS: Therapeutic exercises, Therapeutic activity, Neuromuscular re-education, Balance training, Gait training, Patient/Family education, Self Care, Joint mobilization, Stair training, DME instructions, Dry Needling, Electrical stimulation, Spinal manipulation, Cryotherapy, Moist heat, scar mobilization, Manual therapy, and Re-evaluation   PLAN FOR NEXT SESSION: update HEP as appropriate, progressive LE and functional strengthening, R knee ROM, and balance  exercises. Manual as appropriate.    Nancy Nordmann, PT, DPT 09/05/2021, 3:46 PM  [facility] [address] [place], [place] [address] P: [phone] I F: [phone]

## 2021-09-09 ENCOUNTER — Telehealth: Payer: Medicare PPO | Admitting: Family

## 2021-09-09 DIAGNOSIS — R399 Unspecified symptoms and signs involving the genitourinary system: Secondary | ICD-10-CM

## 2021-09-09 MED ORDER — CEPHALEXIN 500 MG PO CAPS: 500.0000 mg | ORAL_CAPSULE | Freq: Two times a day (BID) | ORAL | 0 refills | Status: DC

## 2021-09-09 NOTE — Progress Notes (Signed)

## 2021-09-10 ENCOUNTER — Encounter: Payer: Self-pay | Admitting: Orthopaedic Surgery

## 2021-09-11 ENCOUNTER — Encounter: Payer: Self-pay | Admitting: Physical Therapy

## 2021-09-11 ENCOUNTER — Ambulatory Visit: Payer: Medicare PPO | Admitting: Physical Therapy

## 2021-09-11 DIAGNOSIS — M25661 Stiffness of right knee, not elsewhere classified: Secondary | ICD-10-CM | POA: Diagnosis not present

## 2021-09-11 DIAGNOSIS — R262 Difficulty in walking, not elsewhere classified: Secondary | ICD-10-CM | POA: Diagnosis not present

## 2021-09-11 DIAGNOSIS — G8929 Other chronic pain: Secondary | ICD-10-CM

## 2021-09-11 DIAGNOSIS — M25561 Pain in right knee: Secondary | ICD-10-CM | POA: Diagnosis not present

## 2021-09-11 DIAGNOSIS — Z96651 Presence of right artificial knee joint: Secondary | ICD-10-CM | POA: Diagnosis not present

## 2021-09-11 NOTE — Therapy (Signed)
OUTPATIENT PHYSICAL THERAPY TREATMENT NOTE   Patient Name: Shannon [name] MRN: [id number] DOB:Nov 09, 1950, 71 y.o., female Today's Date: 09/11/2021  PCP: Abner Greenspan, MD REFERRING PROVIDER: Marylynn Pearson. Erlinda Hong, MD  END OF SESSION:   PT End of Session - 09/11/21 1002     Visit Number 3    Number of Visits 24    Date for PT Re-Evaluation 11/21/21    Authorization Type [employer]IC[employer] reporting period from 08/29/2021    Authorization Time Period auth 24 visits   8/9 - 11/30    Authorization - Visit Number 3    Authorization - Number of Visits 24    Progress Note Due on Visit 10    PT Start Time 0952    PT Stop Time 1032    PT Time Calculation (min) 40 min    Activity Tolerance Patient tolerated treatment well    Behavior During Therapy WFL for tasks assessed/performed              Past Medical History:  Diagnosis Date   Arthritis    left foot   Blood transfusion without reported diagnosis    Chronic neck and back pain    received physical therapy   Dysrhythmia    Pt told she had "benign arrythmia" after wearing monitor   GERD (gastroesophageal reflux disease)    with esophagitis   Motion sickness    boats, planes   Numbness    Right outer thigh, constant, notices more with standing for a long period of time   Plantar fasciitis    left   PONV (postoperative nausea and vomiting)    Past Surgical History:  Procedure Laterality Date   ARTHRODESIS METATARSAL Left 12/30/2018   Procedure: ARTHRODESIS,LISFRANC;MULTIPLE LEFT;  Surgeon: Samara Deist, DPM;  Location: [facility];  Service: Podiatry;  Laterality: Left;  general with local   BLADDER SUSPENSION N/A 07/04/2015   Procedure: TRANSVAGINAL TAPE (TVT) SLING                   ;  Surgeon: Emily Filbert, MD;  Location: [facility];  Service: Gynecology;  Laterality: N/A;   COLONOSCOPY     COLONOSCOPY WITH PROPOFOL N/A 09/08/2019   Procedure: COLONOSCOPY WITH PROPOFOL;  Surgeon: Virgel Manifold, MD;  Location:  [facility];  Service: Endoscopy;  Laterality: N/A;   CYSTOCELE REPAIR N/A 07/04/2015   Procedure: ANTERIOR REPAIR (CYSTOCELE);  Surgeon: Emily Filbert, MD;  Location: [facility];  Service: Gynecology;  Laterality: N/A;   CYSTOSCOPY N/A 07/04/2015   Procedure: CYSTOSCOPY;  Surgeon: Emily Filbert, MD;  Location: [facility];  Service: Gynecology;  Laterality: N/A;   DILATION AND CURETTAGE OF UTERUS     EXCISION MORTON'S NEUROMA Left 08/12/2018   Procedure: EXCISION MORTON'S NEUROMA;  Surgeon: Samara Deist, DPM;  Location: [facility];  Service: Podiatry;  Laterality: Left;  lma local   POLYPECTOMY     TOTAL KNEE ARTHROPLASTY Right 08/13/2021   Procedure: RIGHT TOTAL KNEE ARTHROPLASTY;  Surgeon: Leandrew Koyanagi, MD;  Location: [facility] [place];  Service: Orthopedics;  Laterality: Right;   TUBAL LIGATION  1987   UPPER GI ENDOSCOPY     VAGINAL HYSTERECTOMY N/A 07/04/2015   Procedure: TOTAL VAGINAL HYSTERECTOMY  ;  Surgeon: Emily Filbert, MD;  Location: [facility];  Service: Gynecology;  Laterality: N/A;   Patient Active Problem List   Diagnosis Date Noted   Status post total right knee replacement 08/13/2021   Pre-operative cardiovascular examination  06/10/2021   Pre-op examination 05/31/2021   Primary osteoarthritis of right knee 03/28/2021   Polyp of colon    History of PSVT (paroxysmal supraventricular tachycardia) 06/17/2017   Recurrent UTI 08/23/2016   Estrogen deficiency 12/06/2015   Elevated blood sugar 12/06/2015   Incontinence of urine in female 04/12/2015   Medicare annual wellness visit, subsequent 11/30/2014   Routine general medical examination at a health care facility 11/05/2012   PLANTAR FASCIITIS, LEFT 07/28/2009   PERSONAL HX COLONIC POLYPS 11/02/2007   GERD 10/20/2006    REFERRING DIAG: status post total right knee replacement  THERAPY DIAG:  Chronic pain of right knee  Stiffness of right knee, not elsewhere classified  Difficulty in walking, not elsewhere classified  Rationale for  Evaluation and Treatment: Rehabilitation  PERTINENT HISTORY: Patient is a 71 y.o. female who presents to outpatient physical therapy with a referral for medical diagnosis status post total right knee replacement. This patient's chief complaints consist of right knee pain, stiffness, and weakness and difficulty with walking leading to the following functional deficits: difficulty with all household and community ambulation, kneeling, squatting, going outside, hiking, housework, stairs, etc. Relevant past medical history and comorbidities include arthritis, chronic neck and back pain, GERD, motion sickness, numbness at right outer thigh, plantar fasciitis left foot, PONY, left metatarsal arthrodesis for lisfranc fracture (2020), bladder suspension, cystocele repair, excision of morton's neuroma 2020, polypectomy.  Patient denies hx of cancer, stroke, seizures, lung problems, heart problems, diabetes, unexplained weight loss, unexplained changes in bowel or bladder problems, unexplained stumbling or dropping things, osteoporosis, and spinal surgery    PRECAUTIONS: Fall, don't put pillow behind knee while resting.   SUBJECTIVE: Patient arrives with no AD. She states she has not gotten much sleep the last two nights because she has been up going to the bathroom due to a UTI that was diagnosed day before yesterday (Sunday). She started taking antibiotics Sunday afternoon. She does feel like UTI is starting to get better. Her fatigue was getting better prior to not being able to sleep. She stopped taking iron because she found out that an iron rich environment can promote UTIs. She stats her knee is doing well and she can feel the ROM improving. She states the hamstring stretch eliminated her R hip pain. She has been doing her HEP with no problems. She has a 5# ankle weight that she has been using to stretch her knee into extension and can tell that is making a difference. She is stretching with the weight about 2  min at a time for a total of 6 min per day. She states she "did too much" on Friday and Saturday and made it sore. She went outside and walked around her pond (about 500 steps).   PAIN:  Are you having pain? 3/10 around right knee.     OBJECTIVE:   R knee PROM Flexion (closed chain): 120 after stretching Extension -4 after stretching  TODAY'S TREATMENT: Therapeutic exercise: to centralize symptoms and improve ROM, strength, muscular endurance, and activity tolerance required for successful completion of functional activities.  - NuStep level 1-4 using bilateral upper and lower extremities. Seat/handle setting 11/11. For improved extremity mobility, muscular endurance, and activity tolerance; and to induce the analgesic effect of aerobic exercise, stimulate improved joint nutrition, and prepare body structures and systems for following interventions. x 7:30  minutes. Average SPM = 57.  - Step standing R knee flexion stretch at stairs, 1x20 with 5 second holds.  - standing squat  with B UE support focusing on shifting weight towards R LE and squatting deeper than mini squat, 1x20 - seated single leg hamstring curl at OMEGA machine, 3x10 at 10# at left LE, 3x10 at 5/5/7# R LE.  - seated R knee extension stretch with R ankle elevated at 5# AW over knee, 4 min hold.  - standing R TKE with BlackTB, 1x20 with 5 second hold.  - seated single leg exension at [facility] machine, 1x10 R LE at 10#. Unable to complete with R LE on  - seated LAQ 2x10 L with 10# AW, 3x10 R with 5# AW.  - supine R hamstring stretch with strap, 3x30 seconds, concentrating on knee extension.  - Education on HEP including handout   Pt required multimodal cuing for proper technique and to facilitate improved neuromuscular control, strength, range of motion, and functional ability resulting in improved performance and form.    PATIENT EDUCATION:  Education details: Exercise purpose/form. Self management techniques. HEP including  handout   Person educated: Patient Education method: Explanation, demonstration, tactile and verbal cuing Education comprehension: verbalized and demonstrated understanding and needs further education     HOME EXERCISE PROGRAM: Access Code: [email or web address] URL: [email or web address] Date: 09/05/2021 Prepared by: Rosita Kea  Exercises - Standing Knee Flexion Stretch on Step  - 2 x daily - 1 sets - 20 reps - 5 seconds hold - Squat with Counter Support  - 2 x daily - 2 sets - 10 reps - Standing Terminal Knee Extension with Resistance  - 2 x daily - 1 sets - 20 reps - 5 seconds hold - Hamstring stretch (with strap)  - 2 x daily - 3 sets - 30 seconds hold - Supine Knee Extension Mobilization with Weight  - 2 x daily - 3 reps - 1-5 min hold - Sitting Lateral Patellar Glide  - 1 x daily - 20 reps   ASSESSMENT:   CLINICAL IMPRESSION: Patient arrives with no AD and continued antalgic gait favoring R LE with lack of normal R knee flexion/extension throughout gait cycle. Patient continues to have good knee flexion and is working towards improved knee extension. She appears to be participating in HEP well and making progress as expected with visit fequency of 1x per week. Therefore, plan to continue at 1x per week with progressive strengthening and stretching as appropriate. Patient would benefit from continued management of limiting condition by skilled physical therapist to address remaining impairments and functional limitations to work towards stated goals and return to PLOF or maximal functional independence.   Patient is a 71 y.o. female referred to outpatient physical therapy with a medical diagnosis of status post total right knee replacement who presents with signs and symptoms consistent with right knee pain, stiffness, and dysfunction s/p R TKA on 08/13/2021. Patient presents with significant pain, joint stiffness, ROM, balance, gait, muscle performance (strength/power/endurance),  edema, and activity tolerance impairments that are limiting ability to complete all household and community ambulation, kneeling, squatting, going outside, hiking, housework, stairs, etc, without difficulty. Patient will benefit from skilled physical therapy intervention to address current body structure impairments and activity limitations to improve function and work towards goals set in current POC in order to return to prior level of function or maximal functional improvement.    OBJECTIVE IMPAIRMENTS Abnormal gait, decreased activity tolerance, decreased balance, decreased endurance, decreased knowledge of condition, decreased mobility, difficulty walking, decreased ROM, decreased strength, hypomobility, increased edema, impaired perceived functional ability, impaired flexibility, impaired sensation, improper body mechanics, and pain.  ACTIVITY LIMITATIONS carrying, lifting, bending, standing, squatting, stairs, transfers, bed mobility, bathing, dressing, and locomotion level   PARTICIPATION LIMITATIONS: meal prep, cleaning, laundry, interpersonal relationship, driving, shopping, community activity, yard work, and   all household and community ambulation, kneeling, squatting, going outside, hiking, housework, stairs, etc   PERSONAL FACTORS Age, Past/current experiences, Time since onset of injury/illness/exacerbation, and 3+ comorbidities:   arthritis, chronic neck and back pain, GERD, motion sickness, numbness at right outer thigh, plantar fasciitis left foot, PONY, left metatarsal arthrodesis for lisfranc fracture (2020), bladder suspension, cystocele repair, excision of morton's neuroma 2020, polypectomy are also affecting patient's functional outcome.    REHAB POTENTIAL: Good   CLINICAL DECISION MAKING: Stable/uncomplicated   EVALUATION COMPLEXITY: Low     GOALS: Goals reviewed with patient? No   SHORT TERM GOALS: Target date: 09/12/2021   Patient will be independent with initial home  exercise program for self-management of symptoms. Baseline: Initial HEP to be provided at visit 2 as appropriate (08/29/21); initial HEP provided at visit 2 (09/05/2021);  Goal status: In-progress     LONG TERM GOALS: Target date: 11/21/2021   Patient will be independent with a long-term home exercise program for self-management of symptoms.  Baseline: Initial HEP to be provided at visit 2 as appropriate (08/29/21);  initial HEP provided at visit 2 (09/05/2021);  Goal status: In-progress   2.  Patient will demonstrate improved FOTO to equal or greater than 64 by visit #15 to demonstrate improvement in overall condition and self-reported functional ability.  Baseline: 52 (08/29/21); Goal status: In-progress   3.  Patient will ambulate equal or greater than 1500 feet with no AD and with normal gait pattern on 6 Minute Walk Test.  Baseline: 1133 feet with no AD, antalgic gait favoring R LE, B toe in.   (08/29/21); Goal status: In-progress   4.  Patient will complete 5 Times Sit To Stand in equal or less than 11 seconds in surface 18.5 inches or lower with no UE support and equal weight bearing to demonstrate improved ability to complete transfers and demonstrate decreased fall risk.  Baseline: 15 seconds from 18.5 inch plinth, with B UE support on plinth, weight shifted to left side. (08/29/21); Goal status: In-progress   5.  Patient will complete community, work and/or recreational activities without limitation due to current condition.  Baseline: difficulty with all household and community ambulation, kneeling, squatting, going outside, hiking, housework, stairs, etc (08/29/21); Goal status: In-progress   PLAN: PT FREQUENCY: 1-2x/week   PT DURATION: 12 weeks   PLANNED INTERVENTIONS: Therapeutic exercises, Therapeutic activity, Neuromuscular re-education, Balance training, Gait training, Patient/Family education, Self Care, Joint mobilization, Stair training, DME instructions, Dry  Needling, Electrical stimulation, Spinal manipulation, Cryotherapy, Moist heat, scar mobilization, Manual therapy, and Re-evaluation   PLAN FOR NEXT SESSION: update HEP as appropriate, progressive LE and functional strengthening, R knee ROM, and balance exercises. Manual as appropriate.    Nancy Nordmann, PT, DPT 09/11/2021, 10:51 AM  [facility] [address] [place], [place] [address] P: [phone] I F: [phone]

## 2021-09-13 ENCOUNTER — Other Ambulatory Visit: Payer: Self-pay | Admitting: Physician Assistant

## 2021-09-14 ENCOUNTER — Encounter: Payer: Self-pay | Admitting: Urology

## 2021-09-14 ENCOUNTER — Ambulatory Visit: Payer: Medicare PPO | Admitting: Urology

## 2021-09-14 VITALS — BP 132/75 | HR 75 | Ht 68.0 in | Wt 156.0 lb

## 2021-09-14 DIAGNOSIS — R3915 Urgency of urination: Secondary | ICD-10-CM

## 2021-09-14 DIAGNOSIS — R300 Dysuria: Secondary | ICD-10-CM | POA: Diagnosis not present

## 2021-09-14 DIAGNOSIS — R3989 Other symptoms and signs involving the genitourinary system: Secondary | ICD-10-CM

## 2021-09-14 DIAGNOSIS — R350 Frequency of micturition: Secondary | ICD-10-CM

## 2021-09-14 LAB — MICROSCOPIC EXAMINATION

## 2021-09-14 LAB — URINALYSIS, COMPLETE
Bilirubin, UA: NEGATIVE
Glucose, UA: NEGATIVE
Leukocytes,UA: NEGATIVE
Nitrite, UA: NEGATIVE
RBC, UA: NEGATIVE
Specific Gravity, UA: 1.02 (ref 1.005–1.030)
Urobilinogen, Ur: 0.2 mg/dL (ref 0.2–1.0)
pH, UA: 6.5 (ref 5.0–7.5)

## 2021-09-14 LAB — BLADDER SCAN AMB NON-IMAGING

## 2021-09-14 NOTE — Progress Notes (Signed)
09/14/2021 11:11 AM   Shannon [name] Aug 02, 1950 [id number]  Referring provider: Abner Greenspan, MD [address] [place],  [place] [address]  Urological history: 1. rUTI's -contributing factors of age, incontinence, constipation and vaginal atrophy -Positive documented urine cultures over the last year  E.coli 03/31/2020 -managed with increased fluid intake, good perineal hygiene, avoiding tub baths and vaginal moisturizer   2. OAB/Urgency -managed with Myrbetriq 50 mg daily and pumpkin seed oil  -cysto 10/2018 NED   3. Incontinence -TVT Sling by gynecologist in 06/2015 during hysterectomy with anterior repair  -managed with pessary    Chief Complaint  Patient presents with   Dysuria     HPI: Shannon [name] is a 71 y.o. female who presents today for frequency and urgency not resolving after being prescribed Keflex after an e-Visit.    She had a Foley placed during her right knee replacement on August 13, 2021.  She contacted the folks at the visit on August 20 for symptoms of difficulty passing urine and exaggerated sensation of the need to pass urine and urinary frequency that started that day.   She was prescribed Keflex 500 mg twice daily for 7 days.  She is getting up 12 times nightly that started at the same time.  She noticed that last night she was only getting up 4 times nightly.    UA few bacteria.   PMH: Past Medical History:  Diagnosis Date   Arthritis    left foot   Blood transfusion without reported diagnosis    Chronic neck and back pain    received physical therapy   Dysrhythmia    Pt told she had "benign arrythmia" after wearing monitor   GERD (gastroesophageal reflux disease)    with esophagitis   Motion sickness    boats, planes   Numbness    Right outer thigh, constant, notices more with standing for a long period of time   Plantar fasciitis    left   PONV (postoperative nausea and vomiting)     Surgical History: Past  Surgical History:  Procedure Laterality Date   ARTHRODESIS METATARSAL Left 12/30/2018   Procedure: ARTHRODESIS,LISFRANC;MULTIPLE LEFT;  Surgeon: Samara Deist, DPM;  Location: [facility];  Service: Podiatry;  Laterality: Left;  general with local   BLADDER SUSPENSION N/A 07/04/2015   Procedure: TRANSVAGINAL TAPE (TVT) SLING                   ;  Surgeon: Emily Filbert, MD;  Location: [facility];  Service: Gynecology;  Laterality: N/A;   COLONOSCOPY     COLONOSCOPY WITH PROPOFOL N/A 09/08/2019   Procedure: COLONOSCOPY WITH PROPOFOL;  Surgeon: Virgel Manifold, MD;  Location: [facility];  Service: Endoscopy;  Laterality: N/A;   CYSTOCELE REPAIR N/A 07/04/2015   Procedure: ANTERIOR REPAIR (CYSTOCELE);  Surgeon: Emily Filbert, MD;  Location: [facility];  Service: Gynecology;  Laterality: N/A;   CYSTOSCOPY N/A 07/04/2015   Procedure: CYSTOSCOPY;  Surgeon: Emily Filbert, MD;  Location: [facility];  Service: Gynecology;  Laterality: N/A;   DILATION AND CURETTAGE OF UTERUS     EXCISION MORTON'S NEUROMA Left 08/12/2018   Procedure: EXCISION MORTON'S NEUROMA;  Surgeon: Samara Deist, DPM;  Location: [facility];  Service: Podiatry;  Laterality: Left;  lma local   POLYPECTOMY     TOTAL KNEE ARTHROPLASTY Right 08/13/2021   Procedure: RIGHT TOTAL KNEE ARTHROPLASTY;  Surgeon: Leandrew Koyanagi, MD;  Location: [facility];  Service: Orthopedics;  Laterality: Right;   TUBAL LIGATION  1987   UPPER GI ENDOSCOPY     VAGINAL HYSTERECTOMY N/A 07/04/2015   Procedure: TOTAL VAGINAL HYSTERECTOMY  ;  Surgeon: Emily Filbert, MD;  Location: [facility];  Service: Gynecology;  Laterality: N/A;    Home Medications:  Allergies as of 09/14/2021       Reactions   Adhesive [tape] Rash   (not sure if rash was from ointment or tape)   Alpha-gal Rash   (red meat S/P tick bite)   Neomycin-bacitracin Zn-polymyx Rash   Ointment   Nickel Rash        Medication List        Accurate as of September 14, 2021 11:11 AM. If you have any  questions, ask your nurse or doctor.          aspirin EC 81 MG tablet Take 1 tablet (81 mg total) by mouth 2 (two) times daily. To be taken after surgery to prevent blood clots.  Swallow whole.   cephALEXin 500 MG capsule Commonly known as: KEFLEX Take 1 capsule (500 mg total) by mouth 2 (two) times daily.   cholecalciferol 25 MCG (1000 UNIT) tablet Commonly known as: VITAMIN D3 Take 1,000 Units by mouth daily.   diclofenac Sodium 1 % Gel Commonly known as: VOLTAREN Apply 1 Application topically 4 (four) times daily as needed (pain).   docusate sodium 100 MG capsule Commonly known as: Colace Take 1 capsule (100 mg total) by mouth daily as needed.   famotidine 20 MG tablet Commonly known as: PEPCID Take 20 mg by mouth daily as needed for heartburn or indigestion.   GAMMA AMINOBUTYRIC ACID PO Take 500 mg by mouth daily.   HYDROcodone-acetaminophen 5-325 MG tablet Commonly known as: Norco Take 1 tablet by mouth 3 (three) times daily as needed.   MAGNESIUM GLYCINATE PO Take 400 mg by mouth daily.   melatonin 5 MG Tabs Take 5 mg by mouth at bedtime.   methocarbamol 750 MG tablet Commonly known as: Robaxin-750 Take 1 tablet (750 mg total) by mouth 2 (two) times daily as needed for muscle spasms.   mirabegron ER 50 MG Tb24 tablet Commonly known as: Myrbetriq Take 1 tablet (50 mg total) by mouth daily.   multivitamin tablet Take 1 tablet by mouth daily.   ondansetron 4 MG tablet Commonly known as: Zofran Take 1 tablet (4 mg total) by mouth every 8 (eight) hours as needed for nausea or vomiting.   oxyCODONE-acetaminophen 5-325 MG tablet Commonly known as: Percocet Take 1-2 tablets by mouth every 6 (six) hours as needed.   PHAZYME GAS & ACID MAX ST PO Take 1 tablet by mouth as needed (bloating).   promethazine 12.5 MG tablet Commonly known as: PHENERGAN Take 1 tablet (12.5 mg total) by mouth 2 (two) times daily as needed for nausea or vomiting.   PUMPKIN SEED  OIL PO Take 200 mg by mouth in the morning and at bedtime.   St Johns Wort 300 MG Tabs Take 600 mg by mouth in the morning and at bedtime.   traMADol 50 MG tablet Commonly known as: ULTRAM Take 1-2 tablets (50-100 mg total) by mouth 3 (three) times daily as needed.        Allergies:  Allergies  Allergen Reactions   Adhesive [Tape] Rash    (not sure if rash was from ointment or tape)   Alpha-Gal Rash    (red meat S/P tick bite)   Neomycin-Bacitracin Zn-Polymyx  Rash    Ointment   Nickel Rash    Family History: Family History  Problem Relation Age of Onset   Stroke Mother        x 2   Heart disease Mother        congenital arrhythmia and CHF   Depression Mother    Atrial fibrillation Mother    Stomach cancer Paternal Uncle    Prostate cancer Brother    Atrial fibrillation Brother    Colon cancer Neg Hx    Esophageal cancer Neg Hx    Kidney cancer Neg Hx    Bladder Cancer Neg Hx    Breast cancer Neg Hx     Social History:  reports that she has never smoked. She has never used smokeless tobacco. She reports that she does not drink alcohol and does not use drugs.  ROS: Pertinent ROS in HPI  Physical Exam: BP 132/75   Pulse 75   Ht '5\' 8"'$  (1.727 m)   Wt 156 lb (70.8 kg)   BMI 23.72 kg/m   Constitutional:  Well nourished. Alert and oriented, No acute distress. HEENT: [place] AT, moist mucus membranes.  Trachea midline Cardiovascular: No clubbing, cyanosis, or edema. Respiratory: Normal respiratory effort, no increased work of breathing. Neurologic: Grossly intact, no focal deficits, moving all 4 extremities. Psychiatric: Normal mood and affect.    Laboratory Data: Lab Results  Component Value Date   WBC 6.3 08/28/2021   HGB 11.3 (L) 08/28/2021   HCT 34.7 (L) 08/28/2021   MCV 94.6 08/28/2021   PLT 448 (H) 08/28/2021    Lab Results  Component Value Date   CREATININE 0.77 08/07/2021    Lab Results  Component Value Date   HGBA1C 6.2 06/01/2021    Lab  Results  Component Value Date   TSH 0.95 06/01/2021       Component Value Date/Time   CHOL 169 06/01/2021 0848   HDL 79.90 06/01/2021 0848   CHOLHDL 2 06/01/2021 0848   VLDL 9.4 06/01/2021 0848   LDLCALC 79 06/01/2021 0848    Lab Results  Component Value Date   AST 31 08/07/2021   Lab Results  Component Value Date   ALT 28 08/07/2021  I have reviewed the labs.   Pertinent Imaging:  09/14/21 10:32  Scan Result 58m    Assessment & Plan:    1. Suspected UTI -she is currently on Keflex, so at this time it is difficult to know if her symptoms were caused by an UTI or some other urological issue -we will need to wait on the urine culture and see how her symptoms progress or abate the longer she is on the antibiotic -urine culture is pending  Return for pending urine culture results .  These notes generated with voice recognition software. I apologize for typographical errors.  SRoyden Purl [facility] [address] [place] [phone]

## 2021-09-18 ENCOUNTER — Encounter: Payer: Medicare PPO | Admitting: Physical Therapy

## 2021-09-19 ENCOUNTER — Encounter: Payer: Self-pay | Admitting: Physical Therapy

## 2021-09-19 ENCOUNTER — Ambulatory Visit: Payer: Medicare PPO | Admitting: Physical Therapy

## 2021-09-19 DIAGNOSIS — Z96651 Presence of right artificial knee joint: Secondary | ICD-10-CM | POA: Diagnosis not present

## 2021-09-19 DIAGNOSIS — G8929 Other chronic pain: Secondary | ICD-10-CM | POA: Diagnosis not present

## 2021-09-19 DIAGNOSIS — R262 Difficulty in walking, not elsewhere classified: Secondary | ICD-10-CM

## 2021-09-19 DIAGNOSIS — M25661 Stiffness of right knee, not elsewhere classified: Secondary | ICD-10-CM | POA: Diagnosis not present

## 2021-09-19 DIAGNOSIS — M25561 Pain in right knee: Secondary | ICD-10-CM | POA: Diagnosis not present

## 2021-09-19 LAB — CULTURE, URINE COMPREHENSIVE

## 2021-09-19 NOTE — Therapy (Signed)
OUTPATIENT PHYSICAL THERAPY TREATMENT NOTE   Patient Name: Shannon [name] MRN: [id number] DOB:1950-09-12, 71 y.o., female Today's Date: 09/19/2021  PCP: Abner Greenspan, MD REFERRING PROVIDER: Marylynn Pearson. Erlinda Hong, MD  END OF SESSION:   PT End of Session - 09/19/21 2028     Visit Number 4    Number of Visits 24    Date for PT Re-Evaluation 11/21/21    Authorization Type [employer]IC[employer] reporting period from 08/29/2021    Authorization Time Period auth 24 visits   8/9 - 11/30    Authorization - Visit Number 4    Authorization - Number of Visits 24    Progress Note Due on Visit 10    PT Start Time 4174    PT Stop Time 1515    PT Time Calculation (min) 38 min    Activity Tolerance Patient tolerated treatment well    Behavior During Therapy WFL for tasks assessed/performed               Past Medical History:  Diagnosis Date   Arthritis    left foot   Blood transfusion without reported diagnosis    Chronic neck and back pain    received physical therapy   Dysrhythmia    Pt told she had "benign arrythmia" after wearing monitor   GERD (gastroesophageal reflux disease)    with esophagitis   Motion sickness    boats, planes   Numbness    Right outer thigh, constant, notices more with standing for a long period of time   Plantar fasciitis    left   PONV (postoperative nausea and vomiting)    Past Surgical History:  Procedure Laterality Date   ARTHRODESIS METATARSAL Left 12/30/2018   Procedure: ARTHRODESIS,LISFRANC;MULTIPLE LEFT;  Surgeon: Samara Deist, DPM;  Location: [facility];  Service: Podiatry;  Laterality: Left;  general with local   BLADDER SUSPENSION N/A 07/04/2015   Procedure: TRANSVAGINAL TAPE (TVT) SLING                   ;  Surgeon: Emily Filbert, MD;  Location: [facility];  Service: Gynecology;  Laterality: N/A;   COLONOSCOPY     COLONOSCOPY WITH PROPOFOL N/A 09/08/2019   Procedure: COLONOSCOPY WITH PROPOFOL;  Surgeon: Virgel Manifold, MD;  Location:  [facility];  Service: Endoscopy;  Laterality: N/A;   CYSTOCELE REPAIR N/A 07/04/2015   Procedure: ANTERIOR REPAIR (CYSTOCELE);  Surgeon: Emily Filbert, MD;  Location: [facility];  Service: Gynecology;  Laterality: N/A;   CYSTOSCOPY N/A 07/04/2015   Procedure: CYSTOSCOPY;  Surgeon: Emily Filbert, MD;  Location: [facility];  Service: Gynecology;  Laterality: N/A;   DILATION AND CURETTAGE OF UTERUS     EXCISION MORTON'S NEUROMA Left 08/12/2018   Procedure: EXCISION MORTON'S NEUROMA;  Surgeon: Samara Deist, DPM;  Location: [facility];  Service: Podiatry;  Laterality: Left;  lma local   POLYPECTOMY     TOTAL KNEE ARTHROPLASTY Right 08/13/2021   Procedure: RIGHT TOTAL KNEE ARTHROPLASTY;  Surgeon: Leandrew Koyanagi, MD;  Location: [facility];  Service: Orthopedics;  Laterality: Right;   TUBAL LIGATION  1987   UPPER GI ENDOSCOPY     VAGINAL HYSTERECTOMY N/A 07/04/2015   Procedure: TOTAL VAGINAL HYSTERECTOMY  ;  Surgeon: Emily Filbert, MD;  Location: [facility];  Service: Gynecology;  Laterality: N/A;   Patient Active Problem List   Diagnosis Date Noted   Status post total right knee replacement 08/13/2021   Pre-operative cardiovascular  examination 06/10/2021   Pre-op examination 05/31/2021   Primary osteoarthritis of right knee 03/28/2021   Polyp of colon    History of PSVT (paroxysmal supraventricular tachycardia) 06/17/2017   Recurrent UTI 08/23/2016   Estrogen deficiency 12/06/2015   Elevated blood sugar 12/06/2015   Incontinence of urine in female 04/12/2015   Medicare annual wellness visit, subsequent 11/30/2014   Routine general medical examination at a health care facility 11/05/2012   PLANTAR FASCIITIS, LEFT 07/28/2009   PERSONAL HX COLONIC POLYPS 11/02/2007   GERD 10/20/2006    REFERRING DIAG: status post total right knee replacement  THERAPY DIAG:  Chronic pain of right knee  Stiffness of right knee, not elsewhere classified  Difficulty in walking, not elsewhere classified  Rationale for  Evaluation and Treatment: Rehabilitation  PERTINENT HISTORY: Patient is a 71 y.o. female who presents to outpatient physical therapy with a referral for medical diagnosis status post total right knee replacement. This patient's chief complaints consist of right knee pain, stiffness, and weakness and difficulty with walking leading to the following functional deficits: difficulty with all household and community ambulation, kneeling, squatting, going outside, hiking, housework, stairs, etc. Relevant past medical history and comorbidities include arthritis, chronic neck and back pain, GERD, motion sickness, numbness at right outer thigh, plantar fasciitis left foot, PONY, left metatarsal arthrodesis for lisfranc fracture (2020), bladder suspension, cystocele repair, excision of morton's neuroma 2020, polypectomy.  Patient denies hx of cancer, stroke, seizures, lung problems, heart problems, diabetes, unexplained weight loss, unexplained changes in bowel or bladder problems, unexplained stumbling or dropping things, osteoporosis, and spinal surgery    PRECAUTIONS: Fall, don't put pillow behind knee while resting.   SUBJECTIVE: Patient arrives with no AD. She states she  has been walking about 0.5 - 0.75 of a mile each day down her driveway each day and that is helping with the swelling which improves her comfort. She is going up the stairs reciprocally with minimal to no UE support and down reciprocally sometimes with light UE support unless her knee is too stiff. Her knee doesn't hurt but it feels stiff. Her HEP is getting easier. She felt okay after last PT session. Her steri-strips have come off and she has a stitch poking through midway down the incision and has a small open spot at the distal scar.   PAIN:  Are you having pain? no   OBJECTIVE:   R knee PROM Flexion (closed chain): 125 after stretching Extension -4 after stretching   TODAY'S TREATMENT: Therapeutic exercise: to centralize  symptoms and improve ROM, strength, muscular endurance, and activity tolerance required for successful completion of functional activities.  - NuStep level 5 using bilateral upper and lower extremities. Seat/handle setting 11/11. For improved extremity mobility, muscular endurance, and activity tolerance; and to induce the analgesic effect of aerobic exercise, stimulate improved joint nutrition, and prepare body structures and systems for following interventions. x 7:30  minutes. Average SPM = 61.  - Step standing R knee flexion stretch at stairs, 1x20 with 5 second holds.  - R step up, with touchdown UE support for balance, 3x10.  - standing squat with B UE support focusing on deeper squat, 1x10 - squat with buttocks tap/short sit on 18 inch chair plus airex pad, 2x10. Cuing for hips down.  - seated L LAQ 3x10 with 10# AW - seated single leg hamstring curl at [facility] machine, 3x10 at 10# each side.  - seated L knee extension static stretch with heel propped up and 10#AW draped  over knee, 2 min -Education on HEP including handout   Manual therapy: to reduce pain and tissue tension, improve range of motion, neuromodulation, in order to promote improved ability to complete functional activities. SUPINE - gentle PROM L knee extension with overpressure with heel propped on bolster, 1x10 with 5 second holds (feels at posterior knee).   Pt required multimodal cuing for proper technique and to facilitate improved neuromuscular control, strength, range of motion, and functional ability resulting in improved performance and form.    PATIENT EDUCATION:  Education details: Exercise purpose/form. Self management techniques. HEP including handout   Person educated: Patient Education method: Explanation, demonstration, tactile and verbal cuing Education comprehension: verbalized and demonstrated understanding and needs further education     HOME EXERCISE PROGRAM: Access Code: [email or web address] URL:  [email or web address] Date: 09/19/2021 Prepared by: Rosita Kea  Exercises - Standing Knee Flexion Stretch on Step  - 2 x daily - 1 sets - 20 reps - 5 seconds hold - Forward Step Up  - 2 x daily - 2 sets - 10 reps - Squat with Chair Touch  - 2 x daily - 2 sets - 10 reps - Hamstring stretch (with strap)  - 2 x daily - 3 sets - 30 seconds hold - Supine Knee Extension Mobilization with Weight  - 2 x daily - 3 reps - 1-5 min hold - Sitting Lateral Patellar Glide  - 1 x daily - 20 reps - Seated Long Arc Quad with Ankle Weight  - 1-2 x daily - 3 sets - 10 reps   ASSESSMENT:   CLINICAL IMPRESSION: Patient tolerated treatment well overall with mild increase in discomfort by end of session. Exercises for quad strength were progressed this session. Patient has been able to maintain goal of 125 degrees flexion ROM and has not improved from lacking 4 degrees knee extension. Pain seems well controlled and incision appears to be healing well. Small non-healed portion at base of incision will be monitored. Plan to progress exercises as tolerated next session to support return to prior level of function.  Patient would benefit from continued management of limiting condition by skilled physical therapist to address remaining impairments and functional limitations to work towards stated goals and return to PLOF or maximal functional independence.   Patient is a 71 y.o. female referred to outpatient physical therapy with a medical diagnosis of status post total right knee replacement who presents with signs and symptoms consistent with right knee pain, stiffness, and dysfunction s/p R TKA on 08/13/2021. Patient presents with significant pain, joint stiffness, ROM, balance, gait, muscle performance (strength/power/endurance), edema, and activity tolerance impairments that are limiting ability to complete all household and community ambulation, kneeling, squatting, going outside, hiking, housework, stairs,  etc, without difficulty. Patient will benefit from skilled physical therapy intervention to address current body structure impairments and activity limitations to improve function and work towards goals set in current POC in order to return to prior level of function or maximal functional improvement.    OBJECTIVE IMPAIRMENTS Abnormal gait, decreased activity tolerance, decreased balance, decreased endurance, decreased knowledge of condition, decreased mobility, difficulty walking, decreased ROM, decreased strength, hypomobility, increased edema, impaired perceived functional ability, impaired flexibility, impaired sensation, improper body mechanics, and pain.    ACTIVITY LIMITATIONS carrying, lifting, bending, standing, squatting, stairs, transfers, bed mobility, bathing, dressing, and locomotion level   PARTICIPATION LIMITATIONS: meal prep, cleaning, laundry, interpersonal relationship, driving, shopping, community activity, yard work, and   all household and community ambulation, kneeling, squatting, going  outside, hiking, housework, stairs, etc   PERSONAL FACTORS Age, Past/current experiences, Time since onset of injury/illness/exacerbation, and 3+ comorbidities:   arthritis, chronic neck and back pain, GERD, motion sickness, numbness at right outer thigh, plantar fasciitis left foot, PONY, left metatarsal arthrodesis for lisfranc fracture (2020), bladder suspension, cystocele repair, excision of morton's neuroma 2020, polypectomy are also affecting patient's functional outcome.    REHAB POTENTIAL: Good   CLINICAL DECISION MAKING: Stable/uncomplicated   EVALUATION COMPLEXITY: Low     GOALS: Goals reviewed with patient? No   SHORT TERM GOALS: Target date: 09/12/2021   Patient will be independent with initial home exercise program for self-management of symptoms. Baseline: Initial HEP to be provided at visit 2 as appropriate (08/29/21); initial HEP provided at visit 2 (09/05/2021);  Goal  status: In-progress     LONG TERM GOALS: Target date: 11/21/2021   Patient will be independent with a long-term home exercise program for self-management of symptoms.  Baseline: Initial HEP to be provided at visit 2 as appropriate (08/29/21);  initial HEP provided at visit 2 (09/05/2021);  Goal status: In-progress   2.  Patient will demonstrate improved FOTO to equal or greater than 64 by visit #15 to demonstrate improvement in overall condition and self-reported functional ability.  Baseline: 52 (08/29/21); Goal status: In-progress   3.  Patient will ambulate equal or greater than 1500 feet with no AD and with normal gait pattern on 6 Minute Walk Test.  Baseline: 1133 feet with no AD, antalgic gait favoring R LE, B toe in.   (08/29/21); Goal status: In-progress   4.  Patient will complete 5 Times Sit To Stand in equal or less than 11 seconds in surface 18.5 inches or lower with no UE support and equal weight bearing to demonstrate improved ability to complete transfers and demonstrate decreased fall risk.  Baseline: 15 seconds from 18.5 inch plinth, with B UE support on plinth, weight shifted to left side. (08/29/21); Goal status: In-progress   5.  Patient will complete community, work and/or recreational activities without limitation due to current condition.  Baseline: difficulty with all household and community ambulation, kneeling, squatting, going outside, hiking, housework, stairs, etc (08/29/21); Goal status: In-progress   PLAN: PT FREQUENCY: 1-2x/week   PT DURATION: 12 weeks   PLANNED INTERVENTIONS: Therapeutic exercises, Therapeutic activity, Neuromuscular re-education, Balance training, Gait training, Patient/Family education, Self Care, Joint mobilization, Stair training, DME instructions, Dry Needling, Electrical stimulation, Spinal manipulation, Cryotherapy, Moist heat, scar mobilization, Manual therapy, and Re-evaluation   PLAN FOR NEXT SESSION: update HEP as appropriate,  progressive LE and functional strengthening, R knee ROM, and balance exercises. Manual as appropriate.    Nancy Nordmann, PT, DPT 09/19/2021, 8:34 PM  [facility] [address] [place], [place] [address] P: [phone] I F: [phone]

## 2021-09-26 ENCOUNTER — Ambulatory Visit
Admission: RE | Admit: 2021-09-26 | Discharge: 2021-09-26 | Disposition: A | Discharge status: Home / Self Care | Payer: Medicare PPO | Source: Home / Self Care | Attending: Urology | Admitting: Urology

## 2021-09-26 ENCOUNTER — Ambulatory Visit
Admission: RE | Admit: 2021-09-26 | Discharge: 2021-09-26 | Disposition: A | Discharge status: Home / Self Care | Payer: Medicare PPO | Source: Ambulatory Visit | Attending: Urology | Admitting: Urology

## 2021-09-26 ENCOUNTER — Encounter: Payer: Self-pay | Admitting: Urology

## 2021-09-26 ENCOUNTER — Encounter: Payer: Self-pay | Admitting: Physical Therapy

## 2021-09-26 ENCOUNTER — Ambulatory Visit: Payer: Medicare PPO | Admitting: Urology

## 2021-09-26 ENCOUNTER — Other Ambulatory Visit: Payer: Self-pay

## 2021-09-26 ENCOUNTER — Ambulatory Visit: Payer: Medicare PPO | Attending: Orthopaedic Surgery | Admitting: Physical Therapy

## 2021-09-26 VITALS — BP 112/74 | HR 75 | Ht 68.0 in | Wt 158.0 lb

## 2021-09-26 DIAGNOSIS — G8929 Other chronic pain: Secondary | ICD-10-CM | POA: Diagnosis not present

## 2021-09-26 DIAGNOSIS — R3915 Urgency of urination: Secondary | ICD-10-CM

## 2021-09-26 DIAGNOSIS — M25661 Stiffness of right knee, not elsewhere classified: Secondary | ICD-10-CM | POA: Insufficient documentation

## 2021-09-26 DIAGNOSIS — K5900 Constipation, unspecified: Secondary | ICD-10-CM

## 2021-09-26 DIAGNOSIS — N200 Calculus of kidney: Secondary | ICD-10-CM | POA: Diagnosis not present

## 2021-09-26 DIAGNOSIS — R3989 Other symptoms and signs involving the genitourinary system: Secondary | ICD-10-CM

## 2021-09-26 DIAGNOSIS — R300 Dysuria: Secondary | ICD-10-CM | POA: Diagnosis not present

## 2021-09-26 DIAGNOSIS — R262 Difficulty in walking, not elsewhere classified: Secondary | ICD-10-CM | POA: Diagnosis not present

## 2021-09-26 DIAGNOSIS — N3941 Urge incontinence: Secondary | ICD-10-CM

## 2021-09-26 DIAGNOSIS — R350 Frequency of micturition: Secondary | ICD-10-CM | POA: Diagnosis not present

## 2021-09-26 DIAGNOSIS — M25561 Pain in right knee: Secondary | ICD-10-CM | POA: Insufficient documentation

## 2021-09-26 MED ORDER — SULFAMETHOXAZOLE-TRIMETHOPRIM 800-160 MG PO TABS: 1.0000 | ORAL_TABLET | Freq: Two times a day (BID) | ORAL | 0 refills | Status: DC

## 2021-09-26 NOTE — Therapy (Addendum)
OUTPATIENT PHYSICAL THERAPY TREATMENT NOTE   Patient Name: Shannon [name] MRN: [id number] DOB:12-Jul-1950, 71 y.o., female Today's Date: 09/26/2021  PCP: Abner Greenspan, MD REFERRING PROVIDER: Marylynn Pearson. Erlinda Hong, MD  END OF SESSION:   PT End of Session - 09/26/21 1358     Visit Number 5    Number of Visits 24    Date for PT Re-Evaluation 11/21/21    Authorization Type [employer]IC[employer] reporting period from 08/29/2021    Authorization Time Period auth 24 visits   8/9 - 11/30    Authorization - Visit Number 5    Authorization - Number of Visits 24    Progress Note Due on Visit 10    PT Start Time 1352    PT Stop Time 1435    PT Time Calculation (min) 43 min    Activity Tolerance Patient tolerated treatment well    Behavior During Therapy WFL for tasks assessed/performed             Past Medical History:  Diagnosis Date   Arthritis    left foot   Blood transfusion without reported diagnosis    Chronic neck and back pain    received physical therapy   Dysrhythmia    Pt told she had "benign arrythmia" after wearing monitor   GERD (gastroesophageal reflux disease)    with esophagitis   Motion sickness    boats, planes   Numbness    Right outer thigh, constant, notices more with standing for a long period of time   Plantar fasciitis    left   PONV (postoperative nausea and vomiting)    Past Surgical History:  Procedure Laterality Date   ARTHRODESIS METATARSAL Left 12/30/2018   Procedure: ARTHRODESIS,LISFRANC;MULTIPLE LEFT;  Surgeon: Samara Deist, DPM;  Location: [facility];  Service: Podiatry;  Laterality: Left;  general with local   BLADDER SUSPENSION N/A 07/04/2015   Procedure: TRANSVAGINAL TAPE (TVT) SLING                   ;  Surgeon: Emily Filbert, MD;  Location: [facility];  Service: Gynecology;  Laterality: N/A;   COLONOSCOPY     COLONOSCOPY WITH PROPOFOL N/A 09/08/2019   Procedure: COLONOSCOPY WITH PROPOFOL;  Surgeon: Virgel Manifold, MD;  Location: [facility]  ENDOSCOPY;  Service: Endoscopy;  Laterality: N/A;   CYSTOCELE REPAIR N/A 07/04/2015   Procedure: ANTERIOR REPAIR (CYSTOCELE);  Surgeon: Emily Filbert, MD;  Location: [facility];  Service: Gynecology;  Laterality: N/A;   CYSTOSCOPY N/A 07/04/2015   Procedure: CYSTOSCOPY;  Surgeon: Emily Filbert, MD;  Location: [facility];  Service: Gynecology;  Laterality: N/A;   DILATION AND CURETTAGE OF UTERUS     EXCISION MORTON'S NEUROMA Left 08/12/2018   Procedure: EXCISION MORTON'S NEUROMA;  Surgeon: Samara Deist, DPM;  Location: [facility];  Service: Podiatry;  Laterality: Left;  lma local   POLYPECTOMY     TOTAL KNEE ARTHROPLASTY Right 08/13/2021   Procedure: RIGHT TOTAL KNEE ARTHROPLASTY;  Surgeon: Leandrew Koyanagi, MD;  Location: [facility];  Service: Orthopedics;  Laterality: Right;   TUBAL LIGATION  1987   UPPER GI ENDOSCOPY     VAGINAL HYSTERECTOMY N/A 07/04/2015   Procedure: TOTAL VAGINAL HYSTERECTOMY  ;  Surgeon: Emily Filbert, MD;  Location: [facility];  Service: Gynecology;  Laterality: N/A;   Patient Active Problem List   Diagnosis Date Noted   Status post total right knee replacement 08/13/2021   Pre-operative cardiovascular examination 06/10/2021  Pre-op examination 05/31/2021   Primary osteoarthritis of right knee 03/28/2021   Polyp of colon    History of PSVT (paroxysmal supraventricular tachycardia) 06/17/2017   Recurrent UTI 08/23/2016   Estrogen deficiency 12/06/2015   Elevated blood sugar 12/06/2015   Incontinence of urine in female 04/12/2015   Medicare annual wellness visit, subsequent 11/30/2014   Routine general medical examination at a health care facility 11/05/2012   PLANTAR FASCIITIS, LEFT 07/28/2009   PERSONAL HX COLONIC POLYPS 11/02/2007   GERD 10/20/2006    REFERRING DIAG: status post total right knee replacement  THERAPY DIAG:  Chronic pain of right knee  Stiffness of right knee, not elsewhere classified  Difficulty in walking, not elsewhere classified  Rationale for  Evaluation and Treatment: Rehabilitation  PERTINENT HISTORY: Patient is a 71 y.o. female who presents to outpatient physical therapy with a referral for medical diagnosis status post total right knee replacement. This patient's chief complaints consist of right knee pain, stiffness, and weakness and difficulty with walking leading to the following functional deficits: difficulty with all household and community ambulation, kneeling, squatting, going outside, hiking, housework, stairs, etc. Relevant past medical history and comorbidities include arthritis, chronic neck and back pain, GERD, motion sickness, numbness at right outer thigh, plantar fasciitis left foot, PONY, left metatarsal arthrodesis for lisfranc fracture (2020), bladder suspension, cystocele repair, excision of morton's neuroma 2020, polypectomy.  Patient denies hx of cancer, stroke, seizures, lung problems, heart problems, diabetes, unexplained weight loss, unexplained changes in bowel or bladder problems, unexplained stumbling or dropping things, osteoporosis, and spinal surgery    PRECAUTIONS: Fall, don't put pillow behind knee while resting.   SUBJECTIVE: Patient arrives with no AD. She states she has another UTI so she did not sleep much last night. She states she has an appointment today at 4pm to try to get some relief. Her right knee is doing well and she is tolerating her HEP well. She states she want for a 2 mile walk on Monday and that was too much because it made her knee really sore. She has previously walked up to 1.5 miles and it was a little better. She must get outside by 7am due to the heat. She has been able to decrease the height of the pillow she is using for sit <> stand/squat. She sees Dr. Erlinda Hong tomorrow.   PAIN:  Are you having pain? no  OBJECTIVE  SELF-REPORTED FUNCTION FOTO score: 58/100 (knee questionnaire)   R knee PROM Flexion (closed chain): 134 after stretching Extension -3 after stretching   TODAY'S  TREATMENT: Therapeutic exercise: to centralize symptoms and improve ROM, strength, muscular endurance, and activity tolerance required for successful completion of functional activities.  - NuStep level 5 using bilateral upper and lower extremities. Seat/handle setting 11/11. For improved extremity mobility, muscular endurance, and activity tolerance; and to induce the analgesic effect of aerobic exercise, stimulate improved joint nutrition, and prepare body structures and systems for following interventions. x 5  minutes. Average SPM = 60.  - Step standing R knee flexion stretch at stairs, 1x20 with 5 second holds.  - closed chain R knee extension stretch on chair with left foot up on chair and R proximal tibial braced against edge of chair seat, 1x20 with 5 second hold.  - lateral walking with slightly flexed knees, 1x20 feet each direction.  - seated L LAQ 3x10 with 15# AW - squat with buttocks tap/short sit on 18 inch chair plus pillow 2x10. - R step up, with while  holding 3#DB in left UE, 3x10.  - education on scar massage with demonstration and then patient completing it herself, avoiding visible stitch.  - standing SLS balance with trials of heel taps in star shape, 1x10 each side. Harder on R side. Touchdown UE support as needed.  -Education on HEP including handout  - trial of standing R knee extension stretch with self overpressure but awkward and did not feel effective to patient.   Pt required multimodal cuing for proper technique and to facilitate improved neuromuscular control, strength, range of motion, and functional ability resulting in improved performance and form.    PATIENT EDUCATION:  Education details: Exercise purpose/form. Self management techniques. HEP including handout   Person educated: Patient Education method: Explanation, demonstration, tactile and verbal cuing Education comprehension: verbalized and demonstrated understanding and needs further education      HOME EXERCISE PROGRAM: Access Code: [email or web address] URL: [email or web address] Date: 09/26/2021 Prepared by: Rosita Kea  Exercises - Standing Knee Flexion Stretch on Step  - 2 x daily - 1 sets - 10 reps - 5 seconds hold - Forward Step Up  - 2 x daily - 2 sets - 10 reps - Squat with Chair Touch  - 2 x daily - 2 sets - 10 reps - Hamstring stretch (with strap)  - 2 x daily - 3 sets - 30 seconds hold - Seated Long Arc Quad with Ankle Weight  - 1-2 x daily - 3 sets - 10 reps - Single Leg Stance  - 2 x daily - 2 reps - 30-60 secoonds hold  Patient Education - Scar Massage  Standing R knee extension stretch (picture on phone), 1x20 with 5 second holds 2x per day.    ASSESSMENT:   CLINICAL IMPRESSION: Patient tolerated treatment well for most exercises but stated her knee felt "weird" after knee extension stretch at the chair. She expected she had overdone it slightly with this exercise.  Patient educated to halve knee flexion stretch and back off of intensity due to nearing 135 degrees flexion, where further flexion may contribute to concerns of joint instability. Progressed HEP to include more aggressive knee extension stretch now that incision is further healed and progressive strengthening and balance. FOTO score demonstrates good improvement. Patient appears to be making excellent progress and is nearing full desired ROM. She would benefit from continued strengthening, balance, and motor control interventions to achieve goals and prior level of function. Patient would benefit from continued management of limiting condition by skilled physical therapist to address remaining impairments and functional limitations to work towards stated goals and return to PLOF or maximal functional independence.   Patient is a 71 y.o. female referred to outpatient physical therapy with a medical diagnosis of status post total right knee replacement who presents with signs and symptoms consistent with  right knee pain, stiffness, and dysfunction s/p R TKA on 08/13/2021. Patient presents with significant pain, joint stiffness, ROM, balance, gait, muscle performance (strength/power/endurance), edema, and activity tolerance impairments that are limiting ability to complete all household and community ambulation, kneeling, squatting, going outside, hiking, housework, stairs, etc, without difficulty. Patient will benefit from skilled physical therapy intervention to address current body structure impairments and activity limitations to improve function and work towards goals set in current POC in order to return to prior level of function or maximal functional improvement.    OBJECTIVE IMPAIRMENTS Abnormal gait, decreased activity tolerance, decreased balance, decreased endurance, decreased knowledge of condition, decreased mobility, difficulty walking, decreased ROM, decreased strength, hypomobility, increased  edema, impaired perceived functional ability, impaired flexibility, impaired sensation, improper body mechanics, and pain.    ACTIVITY LIMITATIONS carrying, lifting, bending, standing, squatting, stairs, transfers, bed mobility, bathing, dressing, and locomotion level   PARTICIPATION LIMITATIONS: meal prep, cleaning, laundry, interpersonal relationship, driving, shopping, community activity, yard work, and   all household and community ambulation, kneeling, squatting, going outside, hiking, housework, stairs, etc   PERSONAL FACTORS Age, Past/current experiences, Time since onset of injury/illness/exacerbation, and 3+ comorbidities:   arthritis, chronic neck and back pain, GERD, motion sickness, numbness at right outer thigh, plantar fasciitis left foot, PONY, left metatarsal arthrodesis for lisfranc fracture (2020), bladder suspension, cystocele repair, excision of morton's neuroma 2020, polypectomy are also affecting patient's functional outcome.    REHAB POTENTIAL: Good   CLINICAL DECISION MAKING:  Stable/uncomplicated   EVALUATION COMPLEXITY: Low     GOALS: Goals reviewed with patient? No   SHORT TERM GOALS: Target date: 09/12/2021   Patient will be independent with initial home exercise program for self-management of symptoms. Baseline: Initial HEP to be provided at visit 2 as appropriate (08/29/21); initial HEP provided at visit 2 (09/05/2021);  Goal status: In-progress     LONG TERM GOALS: Target date: 11/21/2021   Patient will be independent with a long-term home exercise program for self-management of symptoms.  Baseline: Initial HEP to be provided at visit 2 as appropriate (08/29/21);  initial HEP provided at visit 2 (09/05/2021);  Goal status: In-progress   2.  Patient will demonstrate improved FOTO to equal or greater than 64 by visit #15 to demonstrate improvement in overall condition and self-reported functional ability.  Baseline: 52 (08/29/21); Goal status: In-progress   3.  Patient will ambulate equal or greater than 1500 feet with no AD and with normal gait pattern on 6 Minute Walk Test.  Baseline: 1133 feet with no AD, antalgic gait favoring R LE, B toe in.   (08/29/21); Goal status: In-progress   4.  Patient will complete 5 Times Sit To Stand in equal or less than 11 seconds in surface 18.5 inches or lower with no UE support and equal weight bearing to demonstrate improved ability to complete transfers and demonstrate decreased fall risk.  Baseline: 15 seconds from 18.5 inch plinth, with B UE support on plinth, weight shifted to left side. (08/29/21); Goal status: In-progress   5.  Patient will complete community, work and/or recreational activities without limitation due to current condition.  Baseline: difficulty with all household and community ambulation, kneeling, squatting, going outside, hiking, housework, stairs, etc (08/29/21); Goal status: In-progress   PLAN: PT FREQUENCY: 1-2x/week   PT DURATION: 12 weeks   PLANNED INTERVENTIONS: Therapeutic  exercises, Therapeutic activity, Neuromuscular re-education, Balance training, Gait training, Patient/Family education, Self Care, Joint mobilization, Stair training, DME instructions, Dry Needling, Electrical stimulation, Spinal manipulation, Cryotherapy, Moist heat, scar mobilization, Manual therapy, and Re-evaluation   PLAN FOR NEXT SESSION: update HEP as appropriate, progressive LE and functional strengthening, R knee ROM, and balance exercises. Manual as appropriate.    Nancy Nordmann, PT, DPT 09/26/2021, 9:43 PM  [facility] [address] [place], [place] [address] P: [phone] I F: [phone]

## 2021-09-26 NOTE — Progress Notes (Signed)
09/26/2021 10:41 PM   [name] Nierenberg Sep 03, 1950 [id number]  Referring provider: Abner Greenspan, MD [address] [place],  [place] [address]  Urological history: 1. rUTI's -contributing factors of age, incontinence, constipation and vaginal atrophy -Positive documented urine cultures over the last year  E.coli 03/31/2020 -managed with increased fluid intake, good perineal hygiene, avoiding tub baths and vaginal moisturizer   2. OAB/Urgency -managed with Myrbetriq 50 mg daily and pumpkin seed oil  -cysto 10/2018 NED   3. Incontinence -TVT Sling by gynecologist in 06/2015 during hysterectomy with anterior repair  -managed with pessary    Chief Complaint  Patient presents with   Dysuria     HPI: Shannon [name] is a 71 y.o. female who presents today for possible UTI.  At her visit on September 14, 2021, she stated she had a Foley placed during her right knee replacement on August 13, 2021.  She contacted the folks at the e-visit on August 20 for symptoms of difficulty passing urine and exaggerated sensation of the need to pass urine and urinary frequency that started that day.  She was prescribed Keflex 500 mg twice daily for 7 days.    She was not getting better on the antibiotic as she was she is getting up 12 times nightly that started at the same time.  She noticed that last night she was only getting up 4 times nightly.  UA few bacteria.  Urine culture was negative.  She was contacted via MyChart over the weekend and stated her symptoms abated.  This morning she contacted the office requesting an urgent visit for UTI and pain.   She states that yesterday she felt that the UTI may have been starting.  This morning she woke up at 4:15 AM with frequency, urgency, dysuria and urge incontinence.  She states she noted her urine was cloudy.  She did have some home test strips for UTI and it was positive for leukocytes.  Patient denies any modifying or aggravating  factors.  Patient denies any gross hematuria, dysuria or suprapubic/flank pain.  Patient denies any fevers, chills, nausea or vomiting.    UA greater than 30 WBCs, 11-30 RBCs and a few bacteria  KUB (09/2021) -moderate stool burden, no obvious urinary calculi are seen  PMH: Past Medical History:  Diagnosis Date   Arthritis    left foot   Blood transfusion without reported diagnosis    Chronic neck and back pain    received physical therapy   Dysrhythmia    Pt told she had "benign arrythmia" after wearing monitor   GERD (gastroesophageal reflux disease)    with esophagitis   Motion sickness    boats, planes   Numbness    Right outer thigh, constant, notices more with standing for a long period of time   Plantar fasciitis    left   PONV (postoperative nausea and vomiting)     Surgical History: Past Surgical History:  Procedure Laterality Date   ARTHRODESIS METATARSAL Left 12/30/2018   Procedure: ARTHRODESIS,LISFRANC;MULTIPLE LEFT;  Surgeon: Samara Deist, DPM;  Location: [facility];  Service: Podiatry;  Laterality: Left;  general with local   BLADDER SUSPENSION N/A 07/04/2015   Procedure: TRANSVAGINAL TAPE (TVT) SLING                   ;  Surgeon: Emily Filbert, MD;  Location: [facility];  Service: Gynecology;  Laterality: N/A;   COLONOSCOPY     COLONOSCOPY  WITH PROPOFOL N/A 09/08/2019   Procedure: COLONOSCOPY WITH PROPOFOL;  Surgeon: Virgel Manifold, MD;  Location: [facility];  Service: Endoscopy;  Laterality: N/A;   CYSTOCELE REPAIR N/A 07/04/2015   Procedure: ANTERIOR REPAIR (CYSTOCELE);  Surgeon: Emily Filbert, MD;  Location: [facility];  Service: Gynecology;  Laterality: N/A;   CYSTOSCOPY N/A 07/04/2015   Procedure: CYSTOSCOPY;  Surgeon: Emily Filbert, MD;  Location: [facility];  Service: Gynecology;  Laterality: N/A;   DILATION AND CURETTAGE OF UTERUS     EXCISION MORTON'S NEUROMA Left 08/12/2018   Procedure: EXCISION MORTON'S NEUROMA;  Surgeon: Samara Deist, DPM;   Location: [facility];  Service: Podiatry;  Laterality: Left;  lma local   POLYPECTOMY     TOTAL KNEE ARTHROPLASTY Right 08/13/2021   Procedure: RIGHT TOTAL KNEE ARTHROPLASTY;  Surgeon: Leandrew Koyanagi, MD;  Location: [facility];  Service: Orthopedics;  Laterality: Right;   TUBAL LIGATION  1987   UPPER GI ENDOSCOPY     VAGINAL HYSTERECTOMY N/A 07/04/2015   Procedure: TOTAL VAGINAL HYSTERECTOMY  ;  Surgeon: Emily Filbert, MD;  Location: [facility];  Service: Gynecology;  Laterality: N/A;    Home Medications:  Allergies as of 09/26/2021       Reactions   Adhesive [tape] Rash   (not sure if rash was from ointment or tape)   Alpha-gal Rash   (red meat S/P tick bite)   Neomycin-bacitracin Zn-polymyx Rash   Ointment   Nickel Rash        Medication List        Accurate as of September 26, 2021 10:41 PM. If you have any questions, ask your nurse or doctor.          STOP taking these medications    cephALEXin 500 MG capsule Commonly known as: KEFLEX Stopped by: Zara Council, PA-C       TAKE these medications    aspirin EC 81 MG tablet Take 1 tablet (81 mg total) by mouth 2 (two) times daily. To be taken after surgery to prevent blood clots.  Swallow whole.   cholecalciferol 25 MCG (1000 UNIT) tablet Commonly known as: VITAMIN D3 Take 1,000 Units by mouth daily.   diclofenac Sodium 1 % Gel Commonly known as: VOLTAREN Apply 1 Application topically 4 (four) times daily as needed (pain).   docusate sodium 100 MG capsule Commonly known as: Colace Take 1 capsule (100 mg total) by mouth daily as needed.   famotidine 20 MG tablet Commonly known as: PEPCID Take 20 mg by mouth daily as needed for heartburn or indigestion.   GAMMA AMINOBUTYRIC ACID PO Take 500 mg by mouth daily.   HYDROcodone-acetaminophen 5-325 MG tablet Commonly known as: Norco Take 1 tablet by mouth 3 (three) times daily as needed.   MAGNESIUM GLYCINATE PO Take 400 mg by mouth daily.   melatonin 5  MG Tabs Take 5 mg by mouth at bedtime.   methocarbamol 750 MG tablet Commonly known as: Robaxin-750 Take 1 tablet (750 mg total) by mouth 2 (two) times daily as needed for muscle spasms.   mirabegron ER 50 MG Tb24 tablet Commonly known as: Myrbetriq Take 1 tablet (50 mg total) by mouth daily.   multivitamin tablet Take 1 tablet by mouth daily.   ondansetron 4 MG tablet Commonly known as: Zofran Take 1 tablet (4 mg total) by mouth every 8 (eight) hours as needed for nausea or vomiting.   oxyCODONE-acetaminophen 5-325 MG tablet Commonly known as: Percocet Take 1-2  tablets by mouth every 6 (six) hours as needed.   PHAZYME GAS & ACID MAX ST PO Take 1 tablet by mouth as needed (bloating).   promethazine 12.5 MG tablet Commonly known as: PHENERGAN Take 1 tablet (12.5 mg total) by mouth 2 (two) times daily as needed for nausea or vomiting.   PUMPKIN SEED OIL PO Take 200 mg by mouth in the morning and at bedtime.   St Johns Wort 300 MG Tabs Take 600 mg by mouth in the morning and at bedtime.   sulfamethoxazole-trimethoprim 800-160 MG tablet Commonly known as: BACTRIM DS Take 1 tablet by mouth every 12 (twelve) hours. Started by: Zara Council, PA-C   traMADol 50 MG tablet Commonly known as: ULTRAM Take 1-2 tablets (50-100 mg total) by mouth 3 (three) times daily as needed.        Allergies:  Allergies  Allergen Reactions   Adhesive [Tape] Rash    (not sure if rash was from ointment or tape)   Alpha-Gal Rash    (red meat S/P tick bite)   Neomycin-Bacitracin Zn-Polymyx Rash    Ointment   Nickel Rash    Family History: Family History  Problem Relation Age of Onset   Stroke Mother        x 2   Heart disease Mother        congenital arrhythmia and CHF   Depression Mother    Atrial fibrillation Mother    Stomach cancer Paternal Uncle    Prostate cancer Brother    Atrial fibrillation Brother    Colon cancer Neg Hx    Esophageal cancer Neg Hx    Kidney  cancer Neg Hx    Bladder Cancer Neg Hx    Breast cancer Neg Hx     Social History:  reports that she has never smoked. She has never used smokeless tobacco. She reports that she does not drink alcohol and does not use drugs.  ROS: Pertinent ROS in HPI  Physical Exam: BP 112/74   Pulse 75   Ht '5\' 8"'$  (1.727 m)   Wt 158 lb (71.7 kg)   BMI 24.02 kg/m   Constitutional:  Well nourished. Alert and oriented, No acute distress. HEENT: [place] AT, moist mucus membranes.  Trachea midline, no masses. Cardiovascular: No clubbing, cyanosis, or edema. Respiratory: Normal respiratory effort, no increased work of breathing. Neurologic: Grossly intact, no focal deficits, moving all 4 extremities. Psychiatric: Normal mood and affect.    Laboratory Data: Urinalysis See Epic and HPI I have reviewed the labs.   Pertinent Imaging: KUB (761950) - moderate stool burden I have independently reviewed the films.  See HPI.  Radiologist interpretation pending  Assessment & Plan:    1. Suspected UTI -As we do not have a urinalysis or urine culture when her symptoms first occurred, it is unknown if this is an inadequate lead treated urinary tract infection versus infection with a different bacteria -UA grossly infected -urine culture -Septra DS  2.  Constipation -Discussed how constipation contributes to recurrent UTIs -Recommended taking a laxative to clear the stool -Recommend to start a probiotic as well  Return for Pending urine culture results.  These notes generated with voice recognition software. I apologize for typographical errors.  Royden Purl  [facility] [address]  [place], [place] [address] [phone]

## 2021-09-26 NOTE — Patient Instructions (Addendum)
Probiotics to have the ingredients Lactobacillus acidophilus, Lactobacillus casei, lactobacillus bulgaricus and streptococcus thermophiles.     Take dulcolax or Senokot-S for constipation

## 2021-09-27 ENCOUNTER — Encounter: Payer: Self-pay | Admitting: Orthopaedic Surgery

## 2021-09-27 ENCOUNTER — Ambulatory Visit (INDEPENDENT_AMBULATORY_CARE_PROVIDER_SITE_OTHER): Payer: Medicare PPO | Admitting: Physician Assistant

## 2021-09-27 ENCOUNTER — Ambulatory Visit (INDEPENDENT_AMBULATORY_CARE_PROVIDER_SITE_OTHER): Payer: Medicare PPO

## 2021-09-27 DIAGNOSIS — Z96651 Presence of right artificial knee joint: Secondary | ICD-10-CM | POA: Diagnosis not present

## 2021-09-27 LAB — URINALYSIS, COMPLETE
Bilirubin, UA: NEGATIVE
Glucose, UA: NEGATIVE
Ketones, UA: NEGATIVE
Nitrite, UA: NEGATIVE
Specific Gravity, UA: 1.025 (ref 1.005–1.030)
Urobilinogen, Ur: 0.2 mg/dL (ref 0.2–1.0)
pH, UA: 5 (ref 5.0–7.5)

## 2021-09-27 LAB — MICROSCOPIC EXAMINATION: WBC, UA: >30 /hpf — AB (ref 0–5)

## 2021-09-27 NOTE — Progress Notes (Signed)
Post-Op Visit Note   Patient: Shannon [name]           Date of Birth: [birth year]           MRN: [id number] Visit Date: 09/27/2021 PCP: Abner Greenspan, MD   Assessment & Plan:  Chief Complaint:  Chief Complaint  Patient presents with   Right Knee - Follow-up    Right total knee arthroplasty 08/13/2021   Visit Diagnoses:  1. Status post total right knee replacement     Plan: Patient is a pleasant 71 year old female who comes in today 6 weeks status post right total knee replacement 08/13/2021.  She has been doing well.  She is in physical therapy making good progress.  She is in minimal pain.  She has been compliant taking her baby aspirin twice daily for DVT prophylaxis.  Examination of the right knee reveals a fully healed surgical scar without complication.  Range of motion 5 to 110 degrees.  She is stable to valgus varus stress.  She is neurovascular intact distally.  At this point, she may discontinue the baby aspirin for DVT prophylaxis.  She will continue with physical therapy as well as a home exercise program.  Dental prophylaxis reinforced.  Follow-up in 6 weeks for repeat evaluation.  Call with concerns or questions.  Follow-Up Instructions: Return in about 6 weeks (around 11/08/2021).   Orders:  Orders Placed This Encounter  Procedures   XR Knee 1-2 Views Right   No orders of the defined types were placed in this encounter.   Imaging: No results found.  PMFS History: Patient Active Problem List   Diagnosis Date Noted   Status post total right knee replacement 08/13/2021   Pre-operative cardiovascular examination 06/10/2021   Pre-op examination 05/31/2021   Primary osteoarthritis of right knee 03/28/2021   Polyp of colon    History of PSVT (paroxysmal supraventricular tachycardia) 06/17/2017   Recurrent UTI 08/23/2016   Estrogen deficiency 12/06/2015   Elevated blood sugar 12/06/2015   Incontinence of urine in female 04/12/2015   Medicare annual wellness  visit, subsequent 11/30/2014   Routine general medical examination at a health care facility 11/05/2012   PLANTAR FASCIITIS, LEFT 07/28/2009   PERSONAL HX COLONIC POLYPS 11/02/2007   GERD 10/20/2006   Past Medical History:  Diagnosis Date   Arthritis    left foot   Blood transfusion without reported diagnosis    Chronic neck and back pain    received physical therapy   Dysrhythmia    Pt told she had "benign arrythmia" after wearing monitor   GERD (gastroesophageal reflux disease)    with esophagitis   Motion sickness    boats, planes   Numbness    Right outer thigh, constant, notices more with standing for a long period of time   Plantar fasciitis    left   PONV (postoperative nausea and vomiting)     Family History  Problem Relation Age of Onset   Stroke Mother        x 2   Heart disease Mother        congenital arrhythmia and CHF   Depression Mother    Atrial fibrillation Mother    Stomach cancer Paternal Uncle    Prostate cancer Brother    Atrial fibrillation Brother    Colon cancer Neg Hx    Esophageal cancer Neg Hx    Kidney cancer Neg Hx    Bladder Cancer Neg Hx    Breast cancer Neg Hx  Past Surgical History:  Procedure Laterality Date   ARTHRODESIS METATARSAL Left 12/30/2018   Procedure: ARTHRODESIS,LISFRANC;MULTIPLE LEFT;  Surgeon: Samara Deist, DPM;  Location: [facility];  Service: Podiatry;  Laterality: Left;  general with local   BLADDER SUSPENSION N/A 07/04/2015   Procedure: TRANSVAGINAL TAPE (TVT) SLING                   ;  Surgeon: Emily Filbert, MD;  Location: [facility];  Service: Gynecology;  Laterality: N/A;   COLONOSCOPY     COLONOSCOPY WITH PROPOFOL N/A 09/08/2019   Procedure: COLONOSCOPY WITH PROPOFOL;  Surgeon: Virgel Manifold, MD;  Location: [facility];  Service: Endoscopy;  Laterality: N/A;   CYSTOCELE REPAIR N/A 07/04/2015   Procedure: ANTERIOR REPAIR (CYSTOCELE);  Surgeon: Emily Filbert, MD;  Location: [facility];  Service:  Gynecology;  Laterality: N/A;   CYSTOSCOPY N/A 07/04/2015   Procedure: CYSTOSCOPY;  Surgeon: Emily Filbert, MD;  Location: [facility];  Service: Gynecology;  Laterality: N/A;   DILATION AND CURETTAGE OF UTERUS     EXCISION MORTON'S NEUROMA Left 08/12/2018   Procedure: EXCISION MORTON'S NEUROMA;  Surgeon: Samara Deist, DPM;  Location: [facility];  Service: Podiatry;  Laterality: Left;  lma local   POLYPECTOMY     TOTAL KNEE ARTHROPLASTY Right 08/13/2021   Procedure: RIGHT TOTAL KNEE ARTHROPLASTY;  Surgeon: Leandrew Koyanagi, MD;  Location: [facility];  Service: Orthopedics;  Laterality: Right;   TUBAL LIGATION  1987   UPPER GI ENDOSCOPY     VAGINAL HYSTERECTOMY N/A 07/04/2015   Procedure: TOTAL VAGINAL HYSTERECTOMY  ;  Surgeon: Emily Filbert, MD;  Location: [facility];  Service: Gynecology;  Laterality: N/A;   Social History   Occupational History   Not on file  Tobacco Use   Smoking status: Never   Smokeless tobacco: Never  Vaping Use   Vaping Use: Never used  Substance and Sexual Activity   Alcohol use: No    Alcohol/week: 0.0 standard drinks of alcohol    Comment: rare   Drug use: No   Sexual activity: Not Currently    Birth control/protection: Post-menopausal, Surgical

## 2021-09-29 LAB — CULTURE, URINE COMPREHENSIVE

## 2021-09-30 ENCOUNTER — Other Ambulatory Visit: Payer: Self-pay | Admitting: Urology

## 2021-09-30 DIAGNOSIS — R3129 Other microscopic hematuria: Secondary | ICD-10-CM

## 2021-10-01 ENCOUNTER — Telehealth: Payer: Self-pay

## 2021-10-01 ENCOUNTER — Encounter: Payer: Medicare PPO | Admitting: Physical Therapy

## 2021-10-01 NOTE — Telephone Encounter (Signed)
-----   Message from Nori Riis, PA-C sent at 09/30/2021 10:32 AM EDT ----- I have sent a  MyChart message to Shannon [name] about getting a CT urogram because she continues to have micro heme w/ negative urine cultures.  We need to schedule her for a virtual or in office visit to go over the results.

## 2021-10-01 NOTE — Telephone Encounter (Signed)
Pt scheduled for results follow up.

## 2021-10-03 ENCOUNTER — Encounter: Payer: Self-pay | Admitting: Physical Therapy

## 2021-10-03 ENCOUNTER — Ambulatory Visit: Payer: Medicare PPO | Admitting: Physical Therapy

## 2021-10-03 DIAGNOSIS — N200 Calculus of kidney: Secondary | ICD-10-CM | POA: Diagnosis not present

## 2021-10-03 DIAGNOSIS — M25661 Stiffness of right knee, not elsewhere classified: Secondary | ICD-10-CM | POA: Diagnosis not present

## 2021-10-03 DIAGNOSIS — R262 Difficulty in walking, not elsewhere classified: Secondary | ICD-10-CM | POA: Diagnosis not present

## 2021-10-03 DIAGNOSIS — G8929 Other chronic pain: Secondary | ICD-10-CM | POA: Diagnosis not present

## 2021-10-03 DIAGNOSIS — M25561 Pain in right knee: Secondary | ICD-10-CM | POA: Diagnosis not present

## 2021-10-03 NOTE — Therapy (Signed)
OUTPATIENT PHYSICAL THERAPY TREATMENT NOTE   Patient Name: Shannon [name] MRN: [id number] DOB:1950-07-17, 71 y.o., female Today's Date: 10/03/2021  PCP: Abner Greenspan, MD REFERRING PROVIDER: Marylynn Pearson. Erlinda Hong, MD  END OF SESSION:   PT End of Session - 10/03/21 2059     Visit Number 6    Number of Visits 24    Date for PT Re-Evaluation 11/21/21    Authorization Type [employer]IC[employer] reporting period from 08/29/2021    Authorization Time Period auth 24 visits   8/9 - 11/30    Authorization - Visit Number 6    Authorization - Number of Visits 24    Progress Note Due on Visit 10    PT Start Time 1347    PT Stop Time 1428    PT Time Calculation (min) 41 min    Activity Tolerance Patient tolerated treatment well    Behavior During Therapy WFL for tasks assessed/performed              Past Medical History:  Diagnosis Date   Arthritis    left foot   Blood transfusion without reported diagnosis    Chronic neck and back pain    received physical therapy   Dysrhythmia    Pt told she had "benign arrythmia" after wearing monitor   GERD (gastroesophageal reflux disease)    with esophagitis   Motion sickness    boats, planes   Numbness    Right outer thigh, constant, notices more with standing for a long period of time   Plantar fasciitis    left   PONV (postoperative nausea and vomiting)    Past Surgical History:  Procedure Laterality Date   ARTHRODESIS METATARSAL Left 12/30/2018   Procedure: ARTHRODESIS,LISFRANC;MULTIPLE LEFT;  Surgeon: Samara Deist, DPM;  Location: [facility];  Service: Podiatry;  Laterality: Left;  general with local   BLADDER SUSPENSION N/A 07/04/2015   Procedure: TRANSVAGINAL TAPE (TVT) SLING                   ;  Surgeon: Emily Filbert, MD;  Location: [facility];  Service: Gynecology;  Laterality: N/A;   COLONOSCOPY     COLONOSCOPY WITH PROPOFOL N/A 09/08/2019   Procedure: COLONOSCOPY WITH PROPOFOL;  Surgeon: Virgel Manifold, MD;  Location:  [facility];  Service: Endoscopy;  Laterality: N/A;   CYSTOCELE REPAIR N/A 07/04/2015   Procedure: ANTERIOR REPAIR (CYSTOCELE);  Surgeon: Emily Filbert, MD;  Location: [facility];  Service: Gynecology;  Laterality: N/A;   CYSTOSCOPY N/A 07/04/2015   Procedure: CYSTOSCOPY;  Surgeon: Emily Filbert, MD;  Location: [facility];  Service: Gynecology;  Laterality: N/A;   DILATION AND CURETTAGE OF UTERUS     EXCISION MORTON'S NEUROMA Left 08/12/2018   Procedure: EXCISION MORTON'S NEUROMA;  Surgeon: Samara Deist, DPM;  Location: [facility];  Service: Podiatry;  Laterality: Left;  lma local   POLYPECTOMY     TOTAL KNEE ARTHROPLASTY Right 08/13/2021   Procedure: RIGHT TOTAL KNEE ARTHROPLASTY;  Surgeon: Leandrew Koyanagi, MD;  Location: [facility];  Service: Orthopedics;  Laterality: Right;   TUBAL LIGATION  1987   UPPER GI ENDOSCOPY     VAGINAL HYSTERECTOMY N/A 07/04/2015   Procedure: TOTAL VAGINAL HYSTERECTOMY  ;  Surgeon: Emily Filbert, MD;  Location: [facility];  Service: Gynecology;  Laterality: N/A;   Patient Active Problem List   Diagnosis Date Noted   Status post total right knee replacement 08/13/2021   Pre-operative cardiovascular examination  06/10/2021   Pre-op examination 05/31/2021   Primary osteoarthritis of right knee 03/28/2021   Polyp of colon    History of PSVT (paroxysmal supraventricular tachycardia) 06/17/2017   Recurrent UTI 08/23/2016   Estrogen deficiency 12/06/2015   Elevated blood sugar 12/06/2015   Incontinence of urine in female 04/12/2015   Medicare annual wellness visit, subsequent 11/30/2014   Routine general medical examination at a health care facility 11/05/2012   PLANTAR FASCIITIS, LEFT 07/28/2009   PERSONAL HX COLONIC POLYPS 11/02/2007   GERD 10/20/2006    REFERRING DIAG: status post total right knee replacement  THERAPY DIAG:  Chronic pain of right knee  Stiffness of right knee, not elsewhere classified  Difficulty in walking, not elsewhere classified  Rationale for  Evaluation and Treatment: Rehabilitation  PERTINENT HISTORY: Patient is a 71 y.o. female who presents to outpatient physical therapy with a referral for medical diagnosis status post total right knee replacement. This patient's chief complaints consist of right knee pain, stiffness, and weakness and difficulty with walking leading to the following functional deficits: difficulty with all household and community ambulation, kneeling, squatting, going outside, hiking, housework, stairs, etc. Relevant past medical history and comorbidities include arthritis, chronic neck and back pain, GERD, motion sickness, numbness at right outer thigh, plantar fasciitis left foot, PONY, left metatarsal arthrodesis for lisfranc fracture (2020), bladder suspension, cystocele repair, excision of morton's neuroma 2020, polypectomy.  Patient denies hx of cancer, stroke, seizures, lung problems, heart problems, diabetes, unexplained weight loss, unexplained changes in bowel or bladder problems, unexplained stumbling or dropping things, osteoporosis, and spinal surgery    PRECAUTIONS: Fall, don't put pillow behind knee while resting.   SUBJECTIVE: Patient arrives with no AD. Her UTI symptoms have improved but her doctor is doing further testing. Overall she is averaging about 8K steps per day and she has been walking 1.5-2 miles daily weather and schedule permitting. She had some trouble figuring out how to do the knee extension stretch at home and when she figured out a way to do it, it was hard to tolerate. She had to start with 10 reps at first and is now up to 20 reps. Her follow up with Dr. Erlinda Hong went well and xrays showed that the prosthesis was sitting correctly. She has a red spot on the proximal incision that is tender to touch and she feels developed since last Thursday. She has been on antibiotics for the last week. She feels that if she does extension stretch followed by flexion stretch her knee feels looser overall. She  felt okay after last PT session. Patient feels like she has normal energy again. She has been adding up to 10# of weight to her step up. She will see Dr. Erlinda Hong again in 6 weeks.   PAIN:  Are you having pain? No, just tightness.   OBJECTIVE  R knee PROM Flexion (closed chain): 130 after stretching Extension -3 after stretching    TODAY'S TREATMENT: Therapeutic exercise: to centralize symptoms and improve ROM, strength, muscular endurance, and activity tolerance required for successful completion of functional activities.  - NuStep level 1-5 using bilateral upper and lower extremities. Seat/handle setting 11/11. For improved extremity mobility, muscular endurance, and activity tolerance; and to induce the analgesic effect of aerobic exercise, stimulate improved joint nutrition, and prepare body structures and systems for following interventions. x 6  minutes. Average SPM = 70.  - closed chain R knee extension stretch on chair with left foot up on chair and R proximal tibial  braced against edge of chair seat, 1x20 with 5 second hold.  - Step standing R knee flexion stretch at stairs, 1x10 with 5 second holds.  - lateral heel tap off of 6 inch chair, 2x10 each side plus more reps to learn technique.  - squat with buttocks tap/short sit on 18 inch chair 2x10 attempting to progress to just tap but ending up with staggered stance biasing R LE.  - lateral walking with slightly flexed knees with YTB around ankles, 1x20 feet each direction. - Education on HEP including handout   Pt required multimodal cuing for proper technique and to facilitate improved neuromuscular control, strength, range of motion, and functional ability resulting in improved performance and form.    PATIENT EDUCATION:  Education details: Exercise purpose/form. Self management techniques. HEP including handout   Person educated: Patient Education method: Explanation, demonstration, tactile and verbal cuing Education  comprehension: verbalized and demonstrated understanding and needs further education     HOME EXERCISE PROGRAM: Access Code: [email or web address] URL: [email or web address] Date: 10/03/2021 Prepared by: Rosita Kea  Exercises - Standing Knee Flexion Stretch on Step  - 2 x daily - 1 sets - 10 reps - 5 seconds hold - Lateral Step Down  - 2 x daily - 2 sets - 10 reps - Staggered Sit-to-Stand  - 2 x daily - 2 sets - 10 reps - Hamstring stretch (with strap)  - 2 x daily - 3 sets - 30 seconds hold - Seated Long Arc Quad with Ankle Weight  - 1-2 x daily - 3 sets - 10 reps - Single Leg Stance  - 2 x daily - 2 reps - 30-60 seconds hold  Patient Education - Scar Massage  Standing R knee extension stretch (picture on phone), 1x20 with 5 second holds 2x per day.    ASSESSMENT:   CLINICAL IMPRESSION: Patient tolerated treatment well overall but found lateral heel tap very challenging. Continues to make good progress. HEP updated as appropriate with instructions for regression or progression as needed. Plan to continue with strengthening and ROM exercises as appropriate next session. Patient advised to monitor red spot near proximal incision and call MD if it gets bigger. Patient would benefit from continued management of limiting condition by skilled physical therapist to address remaining impairments and functional limitations to work towards stated goals and return to PLOF or maximal functional independence.   Patient is a 71 y.o. female referred to outpatient physical therapy with a medical diagnosis of status post total right knee replacement who presents with signs and symptoms consistent with right knee pain, stiffness, and dysfunction s/p R TKA on 08/13/2021. Patient presents with significant pain, joint stiffness, ROM, balance, gait, muscle performance (strength/power/endurance), edema, and activity tolerance impairments that are limiting ability to complete all household and community  ambulation, kneeling, squatting, going outside, hiking, housework, stairs, etc, without difficulty. Patient will benefit from skilled physical therapy intervention to address current body structure impairments and activity limitations to improve function and work towards goals set in current POC in order to return to prior level of function or maximal functional improvement.    OBJECTIVE IMPAIRMENTS Abnormal gait, decreased activity tolerance, decreased balance, decreased endurance, decreased knowledge of condition, decreased mobility, difficulty walking, decreased ROM, decreased strength, hypomobility, increased edema, impaired perceived functional ability, impaired flexibility, impaired sensation, improper body mechanics, and pain.    ACTIVITY LIMITATIONS carrying, lifting, bending, standing, squatting, stairs, transfers, bed mobility, bathing, dressing, and locomotion level   PARTICIPATION LIMITATIONS: meal prep, cleaning, laundry, interpersonal  relationship, driving, shopping, community activity, yard work, and   all household and community ambulation, kneeling, squatting, going outside, hiking, housework, stairs, etc   PERSONAL FACTORS Age, Past/current experiences, Time since onset of injury/illness/exacerbation, and 3+ comorbidities:   arthritis, chronic neck and back pain, GERD, motion sickness, numbness at right outer thigh, plantar fasciitis left foot, PONY, left metatarsal arthrodesis for lisfranc fracture (2020), bladder suspension, cystocele repair, excision of morton's neuroma 2020, polypectomy are also affecting patient's functional outcome.    REHAB POTENTIAL: Good   CLINICAL DECISION MAKING: Stable/uncomplicated   EVALUATION COMPLEXITY: Low     GOALS: Goals reviewed with patient? No   SHORT TERM GOALS: Target date: 09/12/2021   Patient will be independent with initial home exercise program for self-management of symptoms. Baseline: Initial HEP to be provided at visit 2 as  appropriate (08/29/21); initial HEP provided at visit 2 (09/05/2021);  Goal status: In-progress     LONG TERM GOALS: Target date: 11/21/2021   Patient will be independent with a long-term home exercise program for self-management of symptoms.  Baseline: Initial HEP to be provided at visit 2 as appropriate (08/29/21);  initial HEP provided at visit 2 (09/05/2021);  Goal status: In-progress   2.  Patient will demonstrate improved FOTO to equal or greater than 64 by visit #15 to demonstrate improvement in overall condition and self-reported functional ability.  Baseline: 52 (08/29/21); Goal status: In-progress   3.  Patient will ambulate equal or greater than 1500 feet with no AD and with normal gait pattern on 6 Minute Walk Test.  Baseline: 1133 feet with no AD, antalgic gait favoring R LE, B toe in.   (08/29/21); Goal status: In-progress   4.  Patient will complete 5 Times Sit To Stand in equal or less than 11 seconds in surface 18.5 inches or lower with no UE support and equal weight bearing to demonstrate improved ability to complete transfers and demonstrate decreased fall risk.  Baseline: 15 seconds from 18.5 inch plinth, with B UE support on plinth, weight shifted to left side. (08/29/21); Goal status: In-progress   5.  Patient will complete community, work and/or recreational activities without limitation due to current condition.  Baseline: difficulty with all household and community ambulation, kneeling, squatting, going outside, hiking, housework, stairs, etc (08/29/21); Goal status: In-progress   PLAN: PT FREQUENCY: 1-2x/week   PT DURATION: 12 weeks   PLANNED INTERVENTIONS: Therapeutic exercises, Therapeutic activity, Neuromuscular re-education, Balance training, Gait training, Patient/Family education, Self Care, Joint mobilization, Stair training, DME instructions, Dry Needling, Electrical stimulation, Spinal manipulation, Cryotherapy, Moist heat, scar mobilization, Manual  therapy, and Re-evaluation   PLAN FOR NEXT SESSION: update HEP as appropriate, progressive LE and functional strengthening, R knee ROM, and balance exercises. Manual as appropriate.    Nancy Nordmann, PT, DPT 10/03/2021, 9:01 PM  [facility] [address] [place],  [address] P: [phone] I F: [phone]

## 2021-10-08 ENCOUNTER — Encounter: Payer: Medicare PPO | Admitting: Physical Therapy

## 2021-10-09 ENCOUNTER — Encounter: Payer: Self-pay | Admitting: Physical Therapy

## 2021-10-09 ENCOUNTER — Ambulatory Visit: Payer: Medicare PPO | Admitting: Physical Therapy

## 2021-10-09 DIAGNOSIS — M25561 Pain in right knee: Secondary | ICD-10-CM | POA: Diagnosis not present

## 2021-10-09 DIAGNOSIS — N200 Calculus of kidney: Secondary | ICD-10-CM | POA: Diagnosis not present

## 2021-10-09 DIAGNOSIS — G8929 Other chronic pain: Secondary | ICD-10-CM | POA: Diagnosis not present

## 2021-10-09 DIAGNOSIS — M25661 Stiffness of right knee, not elsewhere classified: Secondary | ICD-10-CM | POA: Diagnosis not present

## 2021-10-09 DIAGNOSIS — R262 Difficulty in walking, not elsewhere classified: Secondary | ICD-10-CM

## 2021-10-09 NOTE — Therapy (Signed)
OUTPATIENT PHYSICAL THERAPY TREATMENT NOTE   Patient Name: Shannon [name] MRN: [id number] DOB:09-Jun-1950, 71 y.o., female Today's Date: 10/09/2021  PCP: Abner Greenspan, MD REFERRING PROVIDER: Marylynn Pearson. Erlinda Hong, MD  END OF SESSION:   PT End of Session - 10/09/21 1346     Visit Number 7    Number of Visits 24    Date for PT Re-Evaluation 11/21/21    Authorization Type [employer]IC[employer] reporting period from 08/29/2021    Authorization Time Period auth 24 visits   8/9 - 11/30    Authorization - Visit Number 7    Authorization - Number of Visits 24    Progress Note Due on Visit 10    PT Start Time 2671    PT Stop Time 1345    PT Time Calculation (min) 41 min    Activity Tolerance Patient tolerated treatment well    Behavior During Therapy WFL for tasks assessed/performed               Past Medical History:  Diagnosis Date   Arthritis    left foot   Blood transfusion without reported diagnosis    Chronic neck and back pain    received physical therapy   Dysrhythmia    Pt told she had "benign arrythmia" after wearing monitor   GERD (gastroesophageal reflux disease)    with esophagitis   Motion sickness    boats, planes   Numbness    Right outer thigh, constant, notices more with standing for a long period of time   Plantar fasciitis    left   PONV (postoperative nausea and vomiting)    Past Surgical History:  Procedure Laterality Date   ARTHRODESIS METATARSAL Left 12/30/2018   Procedure: ARTHRODESIS,LISFRANC;MULTIPLE LEFT;  Surgeon: Samara Deist, DPM;  Location: [facility];  Service: Podiatry;  Laterality: Left;  general with local   BLADDER SUSPENSION N/A 07/04/2015   Procedure: TRANSVAGINAL TAPE (TVT) SLING                   ;  Surgeon: Emily Filbert, MD;  Location: [facility];  Service: Gynecology;  Laterality: N/A;   COLONOSCOPY     COLONOSCOPY WITH PROPOFOL N/A 09/08/2019   Procedure: COLONOSCOPY WITH PROPOFOL;  Surgeon: Virgel Manifold, MD;  Location:  [facility];  Service: Endoscopy;  Laterality: N/A;   CYSTOCELE REPAIR N/A 07/04/2015   Procedure: ANTERIOR REPAIR (CYSTOCELE);  Surgeon: Emily Filbert, MD;  Location: [facility];  Service: Gynecology;  Laterality: N/A;   CYSTOSCOPY N/A 07/04/2015   Procedure: CYSTOSCOPY;  Surgeon: Emily Filbert, MD;  Location: [facility];  Service: Gynecology;  Laterality: N/A;   DILATION AND CURETTAGE OF UTERUS     EXCISION MORTON'S NEUROMA Left 08/12/2018   Procedure: EXCISION MORTON'S NEUROMA;  Surgeon: Samara Deist, DPM;  Location: [facility];  Service: Podiatry;  Laterality: Left;  lma local   POLYPECTOMY     TOTAL KNEE ARTHROPLASTY Right 08/13/2021   Procedure: RIGHT TOTAL KNEE ARTHROPLASTY;  Surgeon: Leandrew Koyanagi, MD;  Location: [facility];  Service: Orthopedics;  Laterality: Right;   TUBAL LIGATION  1987   UPPER GI ENDOSCOPY     VAGINAL HYSTERECTOMY N/A 07/04/2015   Procedure: TOTAL VAGINAL HYSTERECTOMY  ;  Surgeon: Emily Filbert, MD;  Location: [facility];  Service: Gynecology;  Laterality: N/A;   Patient Active Problem List   Diagnosis Date Noted   Status post total right knee replacement 08/13/2021   Pre-operative cardiovascular  examination 06/10/2021   Pre-op examination 05/31/2021   Primary osteoarthritis of right knee 03/28/2021   Polyp of colon    History of PSVT (paroxysmal supraventricular tachycardia) 06/17/2017   Recurrent UTI 08/23/2016   Estrogen deficiency 12/06/2015   Elevated blood sugar 12/06/2015   Incontinence of urine in female 04/12/2015   Medicare annual wellness visit, subsequent 11/30/2014   Routine general medical examination at a health care facility 11/05/2012   PLANTAR FASCIITIS, LEFT 07/28/2009   PERSONAL HX COLONIC POLYPS 11/02/2007   GERD 10/20/2006    REFERRING DIAG: status post total right knee replacement  THERAPY DIAG:  Chronic pain of right knee  Stiffness of right knee, not elsewhere classified  Difficulty in walking, not elsewhere classified  Rationale for  Evaluation and Treatment: Rehabilitation  PERTINENT HISTORY: Patient is a 71 y.o. female who presents to outpatient physical therapy with a referral for medical diagnosis status post total right knee replacement. This patient's chief complaints consist of right knee pain, stiffness, and weakness and difficulty with walking leading to the following functional deficits: difficulty with all household and community ambulation, kneeling, squatting, going outside, hiking, housework, stairs, etc. Relevant past medical history and comorbidities include arthritis, chronic neck and back pain, GERD, motion sickness, numbness at right outer thigh, plantar fasciitis left foot, PONY, left metatarsal arthrodesis for lisfranc fracture (2020), bladder suspension, cystocele repair, excision of morton's neuroma 2020, polypectomy.  Patient denies hx of cancer, stroke, seizures, lung problems, heart problems, diabetes, unexplained weight loss, unexplained changes in bowel or bladder problems, unexplained stumbling or dropping things, osteoporosis, and spinal surgery    PRECAUTIONS: Fall, don't put pillow behind knee while resting.   SUBJECTIVE: Patient states she awoke with her knee really stiff, sore, and swollen. She has been struggling with the sit <> stand exercises and feels that has been irritating it. She has been doing it even though it felt irritating. She was also struggling some with the single leg heel tap, but was able to modify it more than the sit <> stand. She is up to 4.5 miles of walking a day. She states it occasionally feels almost normal when she is walking and that seems to loosen it up the best. The red spot a the proximal incision is getting better. She has had to back off of the extension stretch. She can sometimes sit with her legs out in front of her and it takes longer to get uncomfortable there. She feels like the knee is straighter. States she is hesitant to try kneeling.   PAIN:  Are you having  pain? 2-3/10 right knee at lateral knee, stiff and sore.   OBJECTIVE  R knee PROM Flexion (closed chain): 130 after stretching    TODAY'S TREATMENT: Therapeutic exercise: to centralize symptoms and improve ROM, strength, muscular endurance, and activity tolerance required for successful completion of functional activities.  - NuStep level 5 using bilateral upper and lower extremities. Seat/handle setting 11/11. For improved extremity mobility, muscular endurance, and activity tolerance; and to induce the analgesic effect of aerobic exercise, stimulate improved joint nutrition, and prepare body structures and systems for following interventions. x 5:30  minutes. Average SPM = 67.  - closed chain R knee extension stretch on chair with left foot up on chair and R proximal tibial braced against edge of chair seat, 1x20 with 5 second hold.  - Step standing R knee flexion stretch at stairs, 1x10 with 5 second holds.  - reverse lunge with U UE support, 2x10 each  side.  - lateral walking with slightly flexed knees with YTB around ankles, 2x20 feet each direction. - SLS at rebounder ball toss with small pink theraball, 2x10 each side. Additional reps with DLS to get used to ball.  - SLS on airex pad, 2x30 seconds each side with occasional UE support.  - R SLS with head turns on firm surface, 1x20 each direction with touchdown UE support. - Education on HEP including handout  - education about kneeling and trial of kneeling each side with pad under knee.   Pt required multimodal cuing for proper technique and to facilitate improved neuromuscular control, strength, range of motion, and functional ability resulting in improved performance and form.    PATIENT EDUCATION:  Education details: Exercise purpose/form. Self management techniques. HEP including handout   Person educated: Patient Education method: Explanation, demonstration, tactile and verbal cuing Education comprehension: verbalized and  demonstrated understanding and needs further education     HOME EXERCISE PROGRAM: Access Code: [email or web address] URL: [email or web address] Date: 10/09/2021 Prepared by: Rosita Kea  Exercises - Standing Knee Flexion Stretch on Step  - 2 x daily - 1 sets - 10 reps - 5 seconds hold - Reverse Lunge  - 2 x daily - 2 sets - 10 reps - Hamstring stretch (with strap)  - 2 x daily - 3 sets - 30 seconds hold - Seated Long Arc Quad with Ankle Weight  - 1-2 x daily - 3 sets - 10 reps - Single Leg Stance (SLS) Firm Ground With Vertical and Horizontal Head Turns  - 2 x daily - 2 sets - 10-20 reps - Side Stepping with Resistance at Ankles  - 2 x daily - 2 sets - 25 feet  Patient Education - Scar Massage  Standing R knee extension stretch (picture on phone), 1x20 with 5 second holds 2x per day.    ASSESSMENT:   CLINICAL IMPRESSION: Patient arrives with increased pain and difficulty with HEP that was updated last PT session. HEP reviewed and modified to more manageable quad strengthening. Patient felt by better by end of session. Plan to continue with progressive strengthening and balance exercises as appropriate. Patient would benefit from continued management of limiting condition by skilled physical therapist to address remaining impairments and functional limitations to work towards stated goals and return to PLOF or maximal functional independence.   Patient is a 71 y.o. female referred to outpatient physical therapy with a medical diagnosis of status post total right knee replacement who presents with signs and symptoms consistent with right knee pain, stiffness, and dysfunction s/p R TKA on 08/13/2021. Patient presents with significant pain, joint stiffness, ROM, balance, gait, muscle performance (strength/power/endurance), edema, and activity tolerance impairments that are limiting ability to complete all household and community ambulation, kneeling, squatting, going outside, hiking,  housework, stairs, etc, without difficulty. Patient will benefit from skilled physical therapy intervention to address current body structure impairments and activity limitations to improve function and work towards goals set in current POC in order to return to prior level of function or maximal functional improvement.    OBJECTIVE IMPAIRMENTS Abnormal gait, decreased activity tolerance, decreased balance, decreased endurance, decreased knowledge of condition, decreased mobility, difficulty walking, decreased ROM, decreased strength, hypomobility, increased edema, impaired perceived functional ability, impaired flexibility, impaired sensation, improper body mechanics, and pain.    ACTIVITY LIMITATIONS carrying, lifting, bending, standing, squatting, stairs, transfers, bed mobility, bathing, dressing, and locomotion level   PARTICIPATION LIMITATIONS: meal prep, cleaning, laundry, interpersonal relationship, driving, shopping, community activity, yard  work, and   all household and community ambulation, kneeling, squatting, going outside, hiking, housework, stairs, etc   PERSONAL FACTORS Age, Past/current experiences, Time since onset of injury/illness/exacerbation, and 3+ comorbidities:   arthritis, chronic neck and back pain, GERD, motion sickness, numbness at right outer thigh, plantar fasciitis left foot, PONY, left metatarsal arthrodesis for lisfranc fracture (2020), bladder suspension, cystocele repair, excision of morton's neuroma 2020, polypectomy are also affecting patient's functional outcome.    REHAB POTENTIAL: Good   CLINICAL DECISION MAKING: Stable/uncomplicated   EVALUATION COMPLEXITY: Low     GOALS: Goals reviewed with patient? No   SHORT TERM GOALS: Target date: 09/12/2021   Patient will be independent with initial home exercise program for self-management of symptoms. Baseline: Initial HEP to be provided at visit 2 as appropriate (08/29/21); initial HEP provided at visit 2  (09/05/2021);  Goal status: In-progress     LONG TERM GOALS: Target date: 11/21/2021   Patient will be independent with a long-term home exercise program for self-management of symptoms.  Baseline: Initial HEP to be provided at visit 2 as appropriate (08/29/21);  initial HEP provided at visit 2 (09/05/2021);  Goal status: In-progress   2.  Patient will demonstrate improved FOTO to equal or greater than 64 by visit #15 to demonstrate improvement in overall condition and self-reported functional ability.  Baseline: 52 (08/29/21); Goal status: In-progress   3.  Patient will ambulate equal or greater than 1500 feet with no AD and with normal gait pattern on 6 Minute Walk Test.  Baseline: 1133 feet with no AD, antalgic gait favoring R LE, B toe in.   (08/29/21); Goal status: In-progress   4.  Patient will complete 5 Times Sit To Stand in equal or less than 11 seconds in surface 18.5 inches or lower with no UE support and equal weight bearing to demonstrate improved ability to complete transfers and demonstrate decreased fall risk.  Baseline: 15 seconds from 18.5 inch plinth, with B UE support on plinth, weight shifted to left side. (08/29/21); Goal status: In-progress   5.  Patient will complete community, work and/or recreational activities without limitation due to current condition.  Baseline: difficulty with all household and community ambulation, kneeling, squatting, going outside, hiking, housework, stairs, etc (08/29/21); Goal status: In-progress   PLAN: PT FREQUENCY: 1-2x/week   PT DURATION: 12 weeks   PLANNED INTERVENTIONS: Therapeutic exercises, Therapeutic activity, Neuromuscular re-education, Balance training, Gait training, Patient/Family education, Self Care, Joint mobilization, Stair training, DME instructions, Dry Needling, Electrical stimulation, Spinal manipulation, Cryotherapy, Moist heat, scar mobilization, Manual therapy, and Re-evaluation   PLAN FOR NEXT SESSION: update  HEP as appropriate, progressive LE and functional strengthening, R knee ROM, and balance exercises. Manual as appropriate.    Nancy Nordmann, PT, DPT 10/09/2021, 1:48 PM  [facility] [address] [place], [place] [address] P: [phone] I F: [phone]

## 2021-10-10 ENCOUNTER — Encounter: Payer: Medicare PPO | Admitting: Physical Therapy

## 2021-10-15 ENCOUNTER — Encounter: Payer: Medicare PPO | Admitting: Physical Therapy

## 2021-10-16 ENCOUNTER — Ambulatory Visit
Admission: RE | Admit: 2021-10-16 | Discharge: 2021-10-16 | Disposition: A | Discharge status: Home / Self Care | Payer: Medicare PPO | Source: Ambulatory Visit | Attending: Urology | Admitting: Urology

## 2021-10-16 DIAGNOSIS — N390 Urinary tract infection, site not specified: Secondary | ICD-10-CM | POA: Diagnosis not present

## 2021-10-16 DIAGNOSIS — R3129 Other microscopic hematuria: Secondary | ICD-10-CM | POA: Insufficient documentation

## 2021-10-16 DIAGNOSIS — N2889 Other specified disorders of kidney and ureter: Secondary | ICD-10-CM | POA: Diagnosis not present

## 2021-10-16 LAB — POCT I-STAT CREATININE: Creatinine, Ser: 0.8 mg/dL (ref 0.44–1.00)

## 2021-10-16 MED ORDER — IOHEXOL 300 MG/ML  SOLN
100.0000 mL | Freq: Once | INTRAMUSCULAR | Status: AC | PRN
  Administered 2021-10-16: 100 mL via INTRAVENOUS

## 2021-10-17 ENCOUNTER — Encounter: Payer: Self-pay | Admitting: Physical Therapy

## 2021-10-17 ENCOUNTER — Ambulatory Visit: Payer: Medicare PPO | Admitting: Physical Therapy

## 2021-10-17 DIAGNOSIS — G8929 Other chronic pain: Secondary | ICD-10-CM

## 2021-10-17 DIAGNOSIS — M25661 Stiffness of right knee, not elsewhere classified: Secondary | ICD-10-CM | POA: Diagnosis not present

## 2021-10-17 DIAGNOSIS — R262 Difficulty in walking, not elsewhere classified: Secondary | ICD-10-CM | POA: Diagnosis not present

## 2021-10-17 DIAGNOSIS — N200 Calculus of kidney: Secondary | ICD-10-CM | POA: Diagnosis not present

## 2021-10-17 DIAGNOSIS — M25561 Pain in right knee: Secondary | ICD-10-CM | POA: Diagnosis not present

## 2021-10-17 NOTE — Therapy (Signed)
OUTPATIENT PHYSICAL THERAPY TREATMENT NOTE   Patient Name: Lavren Lewan MRN: [id number] DOB:1950-07-02, 71 y.o., female Today's Date: 10/17/2021  PCP: Abner Greenspan, MD REFERRING PROVIDER: Marylynn Pearson. Erlinda Hong, MD  END OF SESSION:   PT End of Session - 10/17/21 1358     Visit Number 8    Number of Visits 24    Date for PT Re-Evaluation 11/21/21    Authorization Type [employer] reporting period from 08/29/2021    Authorization Time Period auth 24 visits   8/9 - 11/30    Authorization - Visit Number 8    Authorization - Number of Visits 24    Progress Note Due on Visit 10    PT Start Time 1352    PT Stop Time 1430    PT Time Calculation (min) 38 min    Activity Tolerance Patient tolerated treatment well    Behavior During Therapy WFL for tasks assessed/performed                Past Medical History:  Diagnosis Date   Arthritis    left foot   Blood transfusion without reported diagnosis    Chronic neck and back pain    received physical therapy   Dysrhythmia    Pt told she had "benign arrythmia" after wearing monitor   GERD (gastroesophageal reflux disease)    with esophagitis   Motion sickness    boats, planes   Numbness    Right outer thigh, constant, notices more with standing for a long period of time   Plantar fasciitis    left   PONV (postoperative nausea and vomiting)    Past Surgical History:  Procedure Laterality Date   ARTHRODESIS METATARSAL Left 12/30/2018   Procedure: ARTHRODESIS,LISFRANC;MULTIPLE LEFT;  Surgeon: Samara Deist, DPM;  Location: [facility];  Service: Podiatry;  Laterality: Left;  general with local   BLADDER SUSPENSION N/A 07/04/2015   Procedure: TRANSVAGINAL TAPE (TVT) SLING                   ;  Surgeon: Emily Filbert, MD;  Location: [facility];  Service: Gynecology;  Laterality: N/A;   COLONOSCOPY     COLONOSCOPY WITH PROPOFOL N/A 09/08/2019   Procedure: COLONOSCOPY WITH PROPOFOL;  Surgeon: Virgel Manifold, MD;   Location: [facility];  Service: Endoscopy;  Laterality: N/A;   CYSTOCELE REPAIR N/A 07/04/2015   Procedure: ANTERIOR REPAIR (CYSTOCELE);  Surgeon: Emily Filbert, MD;  Location: [facility];  Service: Gynecology;  Laterality: N/A;   CYSTOSCOPY N/A 07/04/2015   Procedure: CYSTOSCOPY;  Surgeon: Emily Filbert, MD;  Location: [facility];  Service: Gynecology;  Laterality: N/A;   DILATION AND CURETTAGE OF UTERUS     EXCISION MORTON'S NEUROMA Left 08/12/2018   Procedure: EXCISION MORTON'S NEUROMA;  Surgeon: Samara Deist, DPM;  Location: [facility];  Service: Podiatry;  Laterality: Left;  lma local   POLYPECTOMY     TOTAL KNEE ARTHROPLASTY Right 08/13/2021   Procedure: RIGHT TOTAL KNEE ARTHROPLASTY;  Surgeon: Leandrew Koyanagi, MD;  Location: [facility];  Service: Orthopedics;  Laterality: Right;   TUBAL LIGATION  1987   UPPER GI ENDOSCOPY     VAGINAL HYSTERECTOMY N/A 07/04/2015   Procedure: TOTAL VAGINAL HYSTERECTOMY  ;  Surgeon: Emily Filbert, MD;  Location: [facility];  Service: Gynecology;  Laterality: N/A;   Patient Active Problem List   Diagnosis Date Noted   Status post total right knee replacement 08/13/2021   Pre-operative  cardiovascular examination 06/10/2021   Pre-op examination 05/31/2021   Primary osteoarthritis of right knee 03/28/2021   Polyp of colon    History of PSVT (paroxysmal supraventricular tachycardia) 06/17/2017   Recurrent UTI 08/23/2016   Estrogen deficiency 12/06/2015   Elevated blood sugar 12/06/2015   Incontinence of urine in female 04/12/2015   Medicare annual wellness visit, subsequent 11/30/2014   Routine general medical examination at a health care facility 11/05/2012   PLANTAR FASCIITIS, LEFT 07/28/2009   PERSONAL HX COLONIC POLYPS 11/02/2007   GERD 10/20/2006    REFERRING DIAG: status post total right knee replacement  THERAPY DIAG:  Chronic pain of right knee  Stiffness of right knee, not elsewhere classified  Difficulty in walking, not elsewhere  classified  Rationale for Evaluation and Treatment: Rehabilitation  PERTINENT HISTORY: Patient is a 71 y.o. female who presents to outpatient physical therapy with a referral for medical diagnosis status post total right knee replacement. This patient's chief complaints consist of right knee pain, stiffness, and weakness and difficulty with walking leading to the following functional deficits: difficulty with all household and community ambulation, kneeling, squatting, going outside, hiking, housework, stairs, etc. Relevant past medical history and comorbidities include arthritis, chronic neck and back pain, GERD, motion sickness, numbness at right outer thigh, plantar fasciitis left foot, PONY, left metatarsal arthrodesis for lisfranc fracture (2020), bladder suspension, cystocele repair, excision of morton's neuroma 2020, polypectomy.  Patient denies hx of cancer, stroke, seizures, lung problems, heart problems, diabetes, unexplained weight loss, unexplained changes in bowel or bladder problems, unexplained stumbling or dropping things, osteoporosis, and spinal surgery    PRECAUTIONS: Fall, don't put pillow behind knee while resting.   SUBJECTIVE: Patient reports her knee is feeling a lot better than last week and has been able to modulate the intensity of her exercises better when duing the lunge vs the squat or heel tap. She state she is averaging 4.5 miles a day of walking and feels she is getting stronger. She states the balance exercises are most challenging. She states going down stairs is getting easier and she can do them pretty normally without even holding on.  She has not returned to hiking but feels she could do it, especially if she took her hiking sticks. She has practiced getting down to the left knee some and can do it with a piece of furniture. She would like to be able to kneel and get up more easily. She thinks the lunge exercise helps with that. She has a friend who is a person trainer    PAIN:  Are you having pain? denies  OBJECTIVE  R knee PROM Flexion (closed chain): 130 after stretching    TODAY'S TREATMENT: Therapeutic exercise: to centralize symptoms and improve ROM, strength, muscular endurance, and activity tolerance required for successful completion of functional activities.  - NuStep level 5 using bilateral upper and lower extremities. Seat/handle setting 11/11. For improved extremity mobility, muscular endurance, and activity tolerance; and to induce the analgesic effect of aerobic exercise, stimulate improved joint nutrition, and prepare body structures and systems for following interventions. x 6:50  minutes. Average SPM = 68.  - Step standing R knee flexion stretch at stairs, 1x10 with 5 second holds.  -  split squat with occasional U UE support. 2x8-10 each side. - step standing with R foot on 2nd step on stairs, lunge with UE support as needed.  - seated quad extension on OMEGA machine, 3x10 each side 5# - seated hamstring curls on OMEGA  machine, 2x10 each side with 15#.  - SLS with ball toss in front of body with tennis ball, 2x30 seconds each side.  - lateral walking with slightly flexed knees with RTB around ankles, 2x21 feet each direction. - forwards/backwards monster walks with RTB around ankles, 1x21 feet each direction.  - Education on HEP verbally: interchange lateral walking with monster walks and progress to RTB, use stair step to complete 2x20 lunges as tolerated (interchange with split squat if needed),    Pt required multimodal cuing for proper technique and to facilitate improved neuromuscular control, strength, range of motion, and functional ability resulting in improved performance and form.    PATIENT EDUCATION:  Education details: Exercise purpose/form. Self management techniques. HEP including handout   Person educated: Patient Education method: Explanation, demonstration, tactile and verbal cuing Education comprehension:  verbalized and demonstrated understanding and needs further education     HOME EXERCISE PROGRAM: Access Code: [email or web address] URL: [email or web address] Date: 10/09/2021 Prepared by: Rosita Kea  Exercises - Standing Knee Flexion Stretch on Step  - 2 x daily - 1 sets - 10 reps - 5 seconds hold - Reverse Lunge  - 2 x daily - 2 sets - 10 reps - Hamstring stretch (with strap)  - 2 x daily - 3 sets - 30 seconds hold - Seated Long Arc Quad with Ankle Weight  - 1-2 x daily - 3 sets - 10 reps - Single Leg Stance (SLS) Firm Ground With Vertical and Horizontal Head Turns  - 2 x daily - 2 sets - 10-20 reps - Side Stepping with Resistance at Ankles  - 2 x daily - 2 sets - 25 feet  Patient Education - Scar Massage  Standing R knee extension stretch (picture on phone), 1x20 with 5 second holds 2x per day.    ASSESSMENT:   CLINICAL IMPRESSION: Patient arrives with improved pain and strength. She tolerated treatment well and is now able to tolerate knee extension and hamstring curl on OMEGA machine. Patient appears to be approaching readiness for discharge but is not yet confident in her long term improvement and would benefit from further strengthening and balance. HEP updated as appropriate today. Plan to continue with frequency of 1x a week PT visits with focus on HEP. Patient would benefit from continued management of limiting condition by skilled physical therapist to address remaining impairments and functional limitations to work towards stated goals and return to PLOF or maximal functional independence.   Patient is a 71 y.o. female referred to outpatient physical therapy with a medical diagnosis of status post total right knee replacement who presents with signs and symptoms consistent with right knee pain, stiffness, and dysfunction s/p R TKA on 08/13/2021. Patient presents with significant pain, joint stiffness, ROM, balance, gait, muscle performance (strength/power/endurance), edema,  and activity tolerance impairments that are limiting ability to complete all household and community ambulation, kneeling, squatting, going outside, hiking, housework, stairs, etc, without difficulty. Patient will benefit from skilled physical therapy intervention to address current body structure impairments and activity limitations to improve function and work towards goals set in current POC in order to return to prior level of function or maximal functional improvement.    OBJECTIVE IMPAIRMENTS Abnormal gait, decreased activity tolerance, decreased balance, decreased endurance, decreased knowledge of condition, decreased mobility, difficulty walking, decreased ROM, decreased strength, hypomobility, increased edema, impaired perceived functional ability, impaired flexibility, impaired sensation, improper body mechanics, and pain.    ACTIVITY LIMITATIONS carrying, lifting, bending, standing, squatting, stairs, transfers, bed mobility,  bathing, dressing, and locomotion level   PARTICIPATION LIMITATIONS: meal prep, cleaning, laundry, interpersonal relationship, driving, shopping, community activity, yard work, and   all household and community ambulation, kneeling, squatting, going outside, hiking, housework, stairs, etc   PERSONAL FACTORS Age, Past/current experiences, Time since onset of injury/illness/exacerbation, and 3+ comorbidities:   arthritis, chronic neck and back pain, GERD, motion sickness, numbness at right outer thigh, plantar fasciitis left foot, PONY, left metatarsal arthrodesis for lisfranc fracture (2020), bladder suspension, cystocele repair, excision of morton's neuroma 2020, polypectomy are also affecting patient's functional outcome.    REHAB POTENTIAL: Good   CLINICAL DECISION MAKING: Stable/uncomplicated   EVALUATION COMPLEXITY: Low     GOALS: Goals reviewed with patient? No   SHORT TERM GOALS: Target date: 09/12/2021   Patient will be independent with initial home  exercise program for self-management of symptoms. Baseline: Initial HEP to be provided at visit 2 as appropriate (08/29/21); initial HEP provided at visit 2 (09/05/2021);  Goal status: In-progress     LONG TERM GOALS: Target date: 11/21/2021   Patient will be independent with a long-term home exercise program for self-management of symptoms.  Baseline: Initial HEP to be provided at visit 2 as appropriate (08/29/21);  initial HEP provided at visit 2 (09/05/2021);  Goal status: In-progress   2.  Patient will demonstrate improved FOTO to equal or greater than 64 by visit #15 to demonstrate improvement in overall condition and self-reported functional ability.  Baseline: 52 (08/29/21); Goal status: In-progress   3.  Patient will ambulate equal or greater than 1500 feet with no AD and with normal gait pattern on 6 Minute Walk Test.  Baseline: 1133 feet with no AD, antalgic gait favoring R LE, B toe in.   (08/29/21); Goal status: In-progress   4.  Patient will complete 5 Times Sit To Stand in equal or less than 11 seconds in surface 18.5 inches or lower with no UE support and equal weight bearing to demonstrate improved ability to complete transfers and demonstrate decreased fall risk.  Baseline: 15 seconds from 18.5 inch plinth, with B UE support on plinth, weight shifted to left side. (08/29/21); Goal status: In-progress   5.  Patient will complete community, work and/or recreational activities without limitation due to current condition.  Baseline: difficulty with all household and community ambulation, kneeling, squatting, going outside, hiking, housework, stairs, etc (08/29/21); Goal status: In-progress   PLAN: PT FREQUENCY: 1-2x/week   PT DURATION: 12 weeks   PLANNED INTERVENTIONS: Therapeutic exercises, Therapeutic activity, Neuromuscular re-education, Balance training, Gait training, Patient/Family education, Self Care, Joint mobilization, Stair training, DME instructions, Dry  Needling, Electrical stimulation, Spinal manipulation, Cryotherapy, Moist heat, scar mobilization, Manual therapy, and Re-evaluation   PLAN FOR NEXT SESSION: update HEP as appropriate, progressive LE and functional strengthening, R knee ROM, and balance exercises. Manual as appropriate.    Nancy Nordmann, PT, DPT 10/17/2021, 2:55 PM  [facility] [address] [place], [place] [address] P: [phone] I F: [phone]

## 2021-10-22 NOTE — Progress Notes (Signed)
10/23/2021 2:00 PM   Shannon [name] 12-Dec-1950 [id number]  Referring provider: Abner Greenspan, MD [address] [place],   [address]  Urological history: 1. rUTI's -contributing factors of age, incontinence, constipation and vaginal atrophy -documented urine cultures over the last year  09/26/2021 - MUF  09/14/2021 - No growth -managed with increased fluid intake, good perineal hygiene, avoiding tub baths and vaginal moisturizer   2. OAB/Urgency -managed with Myrbetriq 50 mg daily and pumpkin seed oil  -cysto 10/2018 NED   3. Incontinence -TVT Sling by gynecologist in 06/2015 during hysterectomy with anterior repair  -managed with pessary    Chief Complaint  Patient presents with   Results     HPI: Shannon [name] is a 71 y.o. female who presents today for possible UTI.  At her visit on September 14, 2021, she stated she had a Foley placed during her right knee replacement on August 13, 2021.  She contacted the folks at the e-visit on August 20 for symptoms of difficulty passing urine and exaggerated sensation of the need to pass urine and urinary frequency that started that day.  She was prescribed Keflex 500 mg twice daily for 7 days.    She was not getting better on the antibiotic as she was she is getting up 12 times nightly that started at the same time.  She noticed that last night she was only getting up 4 times nightly.  UA few bacteria.  Urine culture was negative.  She was contacted via MyChart over the weekend and stated her symptoms abated.  On the morning of 09/26/2021, she contacted the office requesting an urgent visit for UTI and pain.  She stated that the day before she felt that the UTI may have been starting.  That morning she woke up at 4:15 AM with frequency, urgency, dysuria and urge incontinence.  She states she noted her urine was cloudy.  She did have some home test strips for UTI and it was positive for leukocytes.  UA greater than  30 WBCs, 11-30 RBCs and a few bacteria.  KUB (09/2021) -moderate stool burden, no obvious urinary calculi are seen.  Urine culture grew out MUF.  She underwent a CT urogram on 10/16/2021 - which revealed no explanations for her symptoms or the micro heme, but her distal left ureter was poorly opacified.    Today, she states that the intense frequency, urgency and nocturia have abated.  She no longer feels like she has a urinary tract infection.  She states the symptoms went away quite quickly.  She is now experiencing her baseline frequency, urgency and incontinence.  She would like to see if Logan Bores will work better in controlling her urinary symptoms at this time.  PMH: Past Medical History:  Diagnosis Date   Arthritis    left foot   Blood transfusion without reported diagnosis    Chronic neck and back pain    received physical therapy   Dysrhythmia    Pt told she had "benign arrythmia" after wearing monitor   GERD (gastroesophageal reflux disease)    with esophagitis   Motion sickness    boats, planes   Numbness    Right outer thigh, constant, notices more with standing for a long period of time   Plantar fasciitis    left   PONV (postoperative nausea and vomiting)     Surgical History: Past Surgical History:  Procedure Laterality Date   ARTHRODESIS METATARSAL Left 12/30/2018  Procedure: ARTHRODESIS,LISFRANC;MULTIPLE LEFT;  Surgeon: Samara Deist, DPM;  Location: [facility];  Service: Podiatry;  Laterality: Left;  general with local   BLADDER SUSPENSION N/A 07/04/2015   Procedure: TRANSVAGINAL TAPE (TVT) SLING                   ;  Surgeon: Emily Filbert, MD;  Location: [facility];  Service: Gynecology;  Laterality: N/A;   COLONOSCOPY     COLONOSCOPY WITH PROPOFOL N/A 09/08/2019   Procedure: COLONOSCOPY WITH PROPOFOL;  Surgeon: Virgel Manifold, MD;  Location: [facility];  Service: Endoscopy;  Laterality: N/A;   CYSTOCELE REPAIR N/A 07/04/2015   Procedure: ANTERIOR  REPAIR (CYSTOCELE);  Surgeon: Emily Filbert, MD;  Location: [facility];  Service: Gynecology;  Laterality: N/A;   CYSTOSCOPY N/A 07/04/2015   Procedure: CYSTOSCOPY;  Surgeon: Emily Filbert, MD;  Location: [facility];  Service: Gynecology;  Laterality: N/A;   DILATION AND CURETTAGE OF UTERUS     EXCISION MORTON'S NEUROMA Left 08/12/2018   Procedure: EXCISION MORTON'S NEUROMA;  Surgeon: Samara Deist, DPM;  Location: [facility];  Service: Podiatry;  Laterality: Left;  lma local   POLYPECTOMY     TOTAL KNEE ARTHROPLASTY Right 08/13/2021   Procedure: RIGHT TOTAL KNEE ARTHROPLASTY;  Surgeon: Leandrew Koyanagi, MD;  Location: [facility];  Service: Orthopedics;  Laterality: Right;   TUBAL LIGATION  1987   UPPER GI ENDOSCOPY     VAGINAL HYSTERECTOMY N/A 07/04/2015   Procedure: TOTAL VAGINAL HYSTERECTOMY  ;  Surgeon: Emily Filbert, MD;  Location: [facility];  Service: Gynecology;  Laterality: N/A;    Home Medications:  Allergies as of 10/23/2021       Reactions   Adhesive [tape] Rash   (not sure if rash was from ointment or tape)   Alpha-gal Rash   (red meat S/P tick bite)   Neomycin-bacitracin Zn-polymyx Rash   Ointment   Nickel Rash        Medication List        Accurate as of October 23, 2021  2:00 PM. If you have any questions, ask your nurse or doctor.          STOP taking these medications    sulfamethoxazole-trimethoprim 800-160 MG tablet Commonly known as: BACTRIM DS       TAKE these medications    aspirin EC 81 MG tablet Take 1 tablet (81 mg total) by mouth 2 (two) times daily. To be taken after surgery to prevent blood clots.  Swallow whole.   cholecalciferol 25 MCG (1000 UNIT) tablet Commonly known as: VITAMIN D3 Take 1,000 Units by mouth daily.   diclofenac Sodium 1 % Gel Commonly known as: VOLTAREN Apply 1 Application topically 4 (four) times daily as needed (pain).   docusate sodium 100 MG capsule Commonly known as: Colace Take 1 capsule (100 mg total) by mouth daily as  needed.   famotidine 20 MG tablet Commonly known as: PEPCID Take 20 mg by mouth daily as needed for heartburn or indigestion.   GAMMA AMINOBUTYRIC ACID PO Take 500 mg by mouth daily.   HYDROcodone-acetaminophen 5-325 MG tablet Commonly known as: Norco Take 1 tablet by mouth 3 (three) times daily as needed.   MAGNESIUM GLYCINATE PO Take 400 mg by mouth daily.   melatonin 5 MG Tabs Take 5 mg by mouth at bedtime.   methocarbamol 750 MG tablet Commonly known as: Robaxin-750 Take 1 tablet (750 mg total) by mouth 2 (two) times daily  as needed for muscle spasms.   mirabegron ER 50 MG Tb24 tablet Commonly known as: Myrbetriq Take 1 tablet (50 mg total) by mouth daily.   multivitamin tablet Take 1 tablet by mouth daily.   ondansetron 4 MG tablet Commonly known as: Zofran Take 1 tablet (4 mg total) by mouth every 8 (eight) hours as needed for nausea or vomiting.   oxyCODONE-acetaminophen 5-325 MG tablet Commonly known as: Percocet Take 1-2 tablets by mouth every 6 (six) hours as needed.   PHAZYME GAS & ACID MAX ST PO Take 1 tablet by mouth as needed (bloating).   promethazine 12.5 MG tablet Commonly known as: PHENERGAN Take 1 tablet (12.5 mg total) by mouth 2 (two) times daily as needed for nausea or vomiting.   PUMPKIN SEED OIL PO Take 200 mg by mouth in the morning and at bedtime.   St Johns Wort 300 MG Tabs Take 600 mg by mouth in the morning and at bedtime.   traMADol 50 MG tablet Commonly known as: ULTRAM Take 1-2 tablets (50-100 mg total) by mouth 3 (three) times daily as needed.        Allergies:  Allergies  Allergen Reactions   Adhesive [Tape] Rash    (not sure if rash was from ointment or tape)   Alpha-Gal Rash    (red meat S/P tick bite)   Neomycin-Bacitracin Zn-Polymyx Rash    Ointment   Nickel Rash    Family History: Family History  Problem Relation Age of Onset   Stroke Mother        x 2   Heart disease Mother        congenital  arrhythmia and CHF   Depression Mother    Atrial fibrillation Mother    Stomach cancer Paternal Uncle    Prostate cancer Brother    Atrial fibrillation Brother    Colon cancer Neg Hx    Esophageal cancer Neg Hx    Kidney cancer Neg Hx    Bladder Cancer Neg Hx    Breast cancer Neg Hx     Social History:  reports that she has never smoked. She has never used smokeless tobacco. She reports that she does not drink alcohol and does not use drugs.  ROS: Pertinent ROS in HPI  Physical Exam: BP 113/74   Pulse 78   Ht '5\' 8"'$  (1.727 m)   Wt 158 lb (71.7 kg)   BMI 24.02 kg/m   Constitutional:  Well nourished. Alert and oriented, No acute distress. HEENT: [place] AT, moist mucus membranes.  Trachea midline Cardiovascular: No clubbing, cyanosis, or edema. Respiratory: Normal respiratory effort, no increased work of breathing. Neurologic: Grossly intact, no focal deficits, moving all 4 extremities. Psychiatric: Normal mood and affect.    Laboratory Data: N/A  Pertinent Imaging: CLINICAL DATA:  Microscopic hematuria.  Recurrent UTIs.   EXAM: CT ABDOMEN AND PELVIS WITHOUT AND WITH CONTRAST   TECHNIQUE: Multidetector CT imaging of the abdomen and pelvis was performed following the standard protocol before and following the bolus administration of intravenous contrast.   RADIATION DOSE REDUCTION: This exam was performed according to the departmental dose-optimization program which includes automated exposure control, adjustment of the mA and/or kV according to patient size and/or use of iterative reconstruction technique.   CONTRAST:  164m OMNIPAQUE IOHEXOL 300 MG/ML  SOLN   COMPARISON:  12/08/2016.   FINDINGS: Lower chest: Lung bases are clear. Heart size normal. No pericardial or pleural effusion. Distal esophagus is unremarkable.   Hepatobiliary: Subcentimeter  low-attenuation lesion in the dome of the liver, unchanged and likely a cyst. Liver and gallbladder are otherwise  unremarkable. No biliary ductal dilatation.   Pancreas: Negative.   Spleen: Negative.   Adrenals/Urinary Tract: Adrenal glands are unremarkable. No urinary stones. Renal sinus cysts bilaterally. Subcentimeter low-attenuation lesion in the upper pole left kidney. No specific follow-up of these lesions is necessary. Distal left ureter is poorly opacified, limiting evaluation. Otherwise, no filling defects in the opacified portions of the intrarenal collecting systems, ureters or bladder.   Stomach/Bowel: Stomach and small bowel are unremarkable. Appendix is not readily visualized. Fair amount of stool in the colon is indicative of constipation.   Vascular/Lymphatic: Vascular structures are unremarkable. No pathologically enlarged lymph nodes.   Reproductive: Vaginal pessary in place. Hysterectomy. No adnexal mass.   Other: No free fluid. Mesenteries and peritoneum are unremarkable. Small inguinal hernias contain fat.   Musculoskeletal: Degenerative changes in the spine. No worrisome lytic or sclerotic lesions.   IMPRESSION: Distal left ureter is poorly opacified, limiting evaluation. Otherwise, no findings to explain the patient's hematuria.     Electronically Signed   By: Lorin Picket M.D.   On: 10/17/2021 08:19 I have independently reviewed the films.  See HPI.   Assessment & Plan:    1. High risk hematuria -non-smoker -cysto (10/2018) - NED - CT urogram (09/2021) - poorly opacified ureter on the left  - no reports of gross heme - will recheck UA upon return in one month as the micro heme may have been the result of an UTI -if micro heme is still present on return - will pursue repeat cysto   2.  OAB -Symptoms back to baseline -would like a trial of Gemtesa 75 mg in place of the Myrbetriq  Return in about 1 month (around 11/23/2021) for OAB and PVR .  These notes generated with voice recognition software. I apologize for typographical errors.  Royden Purl  [facility] [address]  [place], [place] [address] [phone]

## 2021-10-23 ENCOUNTER — Ambulatory Visit: Payer: Medicare PPO | Admitting: Urology

## 2021-10-23 ENCOUNTER — Encounter: Payer: Self-pay | Admitting: Urology

## 2021-10-23 VITALS — BP 113/74 | HR 78 | Ht 68.0 in | Wt 158.0 lb

## 2021-10-23 DIAGNOSIS — R319 Hematuria, unspecified: Secondary | ICD-10-CM

## 2021-10-23 DIAGNOSIS — N3281 Overactive bladder: Secondary | ICD-10-CM | POA: Diagnosis not present

## 2021-10-23 DIAGNOSIS — R3129 Other microscopic hematuria: Secondary | ICD-10-CM

## 2021-10-25 ENCOUNTER — Ambulatory Visit: Payer: Medicare PPO | Attending: Orthopaedic Surgery

## 2021-10-25 DIAGNOSIS — M25561 Pain in right knee: Secondary | ICD-10-CM | POA: Insufficient documentation

## 2021-10-25 DIAGNOSIS — R262 Difficulty in walking, not elsewhere classified: Secondary | ICD-10-CM | POA: Insufficient documentation

## 2021-10-25 DIAGNOSIS — M25661 Stiffness of right knee, not elsewhere classified: Secondary | ICD-10-CM | POA: Insufficient documentation

## 2021-10-25 DIAGNOSIS — G8929 Other chronic pain: Secondary | ICD-10-CM | POA: Diagnosis not present

## 2021-10-25 NOTE — Therapy (Signed)
OUTPATIENT PHYSICAL THERAPY TREATMENT NOTE   Patient Name: Shannon [name] MRN: [id number] DOB:[birth year], 71 y.o., female Today's Date: 10/25/2021  PCP: Abner Greenspan, MD REFERRING PROVIDER: Marylynn Pearson. Erlinda Hong, MD  END OF SESSION:   PT End of Session - 10/25/21 1349     Visit Number 9    Number of Visits 24    Date for PT Re-Evaluation 11/21/21    Authorization Type [employer]IC[employer] reporting period from 08/29/2021    Authorization Time Period auth 24 visits   8/9 - 11/30    Authorization - Visit Number 8    Authorization - Number of Visits 24    Progress Note Due on Visit 10    PT Start Time 1347    PT Stop Time 1428    PT Time Calculation (min) 41 min    Activity Tolerance Patient tolerated treatment well    Behavior During Therapy WFL for tasks assessed/performed                Past Medical History:  Diagnosis Date   Arthritis    left foot   Blood transfusion without reported diagnosis    Chronic neck and back pain    received physical therapy   Dysrhythmia    Pt told she had "benign arrythmia" after wearing monitor   GERD (gastroesophageal reflux disease)    with esophagitis   Motion sickness    boats, planes   Numbness    Right outer thigh, constant, notices more with standing for a long period of time   Plantar fasciitis    left   PONV (postoperative nausea and vomiting)    Past Surgical History:  Procedure Laterality Date   ARTHRODESIS METATARSAL Left 12/30/2018   Procedure: ARTHRODESIS,LISFRANC;MULTIPLE LEFT;  Surgeon: Samara Deist, DPM;  Location: [facility];  Service: Podiatry;  Laterality: Left;  general with local   BLADDER SUSPENSION N/A 07/04/2015   Procedure: TRANSVAGINAL TAPE (TVT) SLING                   ;  Surgeon: Emily Filbert, MD;  Location: [facility];  Service: Gynecology;  Laterality: N/A;   COLONOSCOPY     COLONOSCOPY WITH PROPOFOL N/A 09/08/2019   Procedure: COLONOSCOPY WITH PROPOFOL;  Surgeon: Virgel Manifold, MD;   Location: [facility];  Service: Endoscopy;  Laterality: N/A;   CYSTOCELE REPAIR N/A 07/04/2015   Procedure: ANTERIOR REPAIR (CYSTOCELE);  Surgeon: Emily Filbert, MD;  Location: [facility];  Service: Gynecology;  Laterality: N/A;   CYSTOSCOPY N/A 07/04/2015   Procedure: CYSTOSCOPY;  Surgeon: Emily Filbert, MD;  Location: [facility];  Service: Gynecology;  Laterality: N/A;   DILATION AND CURETTAGE OF UTERUS     EXCISION MORTON'S NEUROMA Left 08/12/2018   Procedure: EXCISION MORTON'S NEUROMA;  Surgeon: Samara Deist, DPM;  Location: [facility];  Service: Podiatry;  Laterality: Left;  lma local   POLYPECTOMY     TOTAL KNEE ARTHROPLASTY Right 08/13/2021   Procedure: RIGHT TOTAL KNEE ARTHROPLASTY;  Surgeon: Leandrew Koyanagi, MD;  Location: [facility];  Service: Orthopedics;  Laterality: Right;   TUBAL LIGATION  1987   UPPER GI ENDOSCOPY     VAGINAL HYSTERECTOMY N/A 07/04/2015   Procedure: TOTAL VAGINAL HYSTERECTOMY  ;  Surgeon: Emily Filbert, MD;  Location: [facility];  Service: Gynecology;  Laterality: N/A;   Patient Active Problem List   Diagnosis Date Noted   Status post total right knee replacement 08/13/2021   Pre-operative  cardiovascular examination 06/10/2021   Pre-op examination 05/31/2021   Primary osteoarthritis of right knee 03/28/2021   Polyp of colon    History of PSVT (paroxysmal supraventricular tachycardia) 06/17/2017   Recurrent UTI 08/23/2016   Estrogen deficiency 12/06/2015   Elevated blood sugar 12/06/2015   Incontinence of urine in female 04/12/2015   Medicare annual wellness visit, subsequent 11/30/2014   Routine general medical examination at a health care facility 11/05/2012   PLANTAR FASCIITIS, LEFT 07/28/2009   PERSONAL HX COLONIC POLYPS 11/02/2007   GERD 10/20/2006    REFERRING DIAG: status post total right knee replacement  THERAPY DIAG:  Chronic pain of right knee  Stiffness of right knee, not elsewhere classified  Difficulty in walking, not elsewhere  classified  Rationale for Evaluation and Treatment: Rehabilitation  PERTINENT HISTORY: Patient is a 71 y.o. female who presents to outpatient physical therapy with a referral for medical diagnosis status post total right knee replacement. This patient's chief complaints consist of right knee pain, stiffness, and weakness and difficulty with walking leading to the following functional deficits: difficulty with all household and community ambulation, kneeling, squatting, going outside, hiking, housework, stairs, etc. Relevant past medical history and comorbidities include arthritis, chronic neck and back pain, GERD, motion sickness, numbness at right outer thigh, plantar fasciitis left foot, PONY, left metatarsal arthrodesis for lisfranc fracture (2020), bladder suspension, cystocele repair, excision of morton's neuroma 2020, polypectomy.  Patient denies hx of cancer, stroke, seizures, lung problems, heart problems, diabetes, unexplained weight loss, unexplained changes in bowel or bladder problems, unexplained stumbling or dropping things, osteoporosis, and spinal surgery    PRECAUTIONS: Fall, don't put pillow behind knee while resting.   SUBJECTIVE: Patient denies pain this date. Has been practicing floor to stand transfers and balance exercises with success. No balance issues or pain.   PAIN:  Are you having pain? denies  OBJECTIVE  R knee PROM Flexion (closed chain): 130 after stretching    TODAY'S TREATMENT: Therapeutic exercise: to centralize symptoms and improve ROM, strength, muscular endurance, and activity tolerance required for successful completion of functional activities.    - NuStep level 5 using bilateral upper and lower extremities. Seat/handle setting 11/11. 6 minutes for improving R knee flexion/extension AROM.  -  split squat with occasional U UE support. 2x10 each side.  - Wall squats with back on red physioball: 3x10  - seated quad extension on OMEGA machine, 3x10  each side 5#. SLE at a time.   - seated hamstring curls on OMEGA machine, 2x10 each side with 15#.   - standing hamstring curl with 4# AW's. Education on hamstring strengthening at home.   - SLS with ball toss in front of body with tennis ball, 2x30 seconds each side.   - Lateral walking with slightly flexed knees with RTB around ankles, 2x21 feet each direction.  - Forwards/backwards monster walks with RTB around ankles, 2x21 feet each direction.   - Updated HEP with standing hamstring curl with use of at home AW's.   Pt required multimodal cuing for proper technique and to facilitate improved neuromuscular control, strength, range of motion, and functional ability resulting in improved performance and form.    PATIENT EDUCATION:  Education details: Exercise purpose/form. Self management techniques. HEP including handout   Person educated: Patient Education method: Explanation, demonstration, tactile and verbal cuing Education comprehension: verbalized and demonstrated understanding and needs further education     HOME EXERCISE PROGRAM: Access Code: [email or web address] URL: [email or web address] Date: 10/09/2021 Prepared by: Rosita Kea  Exercises - Standing Knee Flexion Stretch on Step  - 2 x daily - 1 sets - 10 reps - 5 seconds hold - Reverse Lunge  - 2 x daily - 2 sets - 10 reps - Hamstring stretch (with strap)  - 2 x daily - 3 sets - 30 seconds hold - Seated Long Arc Quad with Ankle Weight  - 1-2 x daily - 3 sets - 10 reps - Single Leg Stance (SLS) Firm Ground With Vertical and Horizontal Head Turns  - 2 x daily - 2 sets - 10-20 reps - Side Stepping with Resistance at Ankles  - 2 x daily - 2 sets - 25 feet  Patient Education - Scar Massage  Standing R knee extension stretch (picture on phone), 1x20 with 5 second holds 2x per day.    ASSESSMENT:   CLINICAL IMPRESSION: Continuing PT POC. Pt has excellent demonstration and understanding of all exercises. Educated on  ways to strengthen hamstrings with home equipment with progressions for forwards/backwards monster walks with updated GTB. Pt still demonstrates with quad eccentric contraction weakness with limited depth with split squats and standing squats and will continue to benefit from skilled PT to further progress strengthening to PLOF.    Patient is a 71 y.o. female referred to outpatient physical therapy with a medical diagnosis of status post total right knee replacement who presents with signs and symptoms consistent with right knee pain, stiffness, and dysfunction s/p R TKA on 08/13/2021. Patient presents with significant pain, joint stiffness, ROM, balance, gait, muscle performance (strength/power/endurance), edema, and activity tolerance impairments that are limiting ability to complete all household and community ambulation, kneeling, squatting, going outside, hiking, housework, stairs, etc, without difficulty. Patient will benefit from skilled physical therapy intervention to address current body structure impairments and activity limitations to improve function and work towards goals set in current POC in order to return to prior level of function or maximal functional improvement.    OBJECTIVE IMPAIRMENTS Abnormal gait, decreased activity tolerance, decreased balance, decreased endurance, decreased knowledge of condition, decreased mobility, difficulty walking, decreased ROM, decreased strength, hypomobility, increased edema, impaired perceived functional ability, impaired flexibility, impaired sensation, improper body mechanics, and pain.    ACTIVITY LIMITATIONS carrying, lifting, bending, standing, squatting, stairs, transfers, bed mobility, bathing, dressing, and locomotion level   PARTICIPATION LIMITATIONS: meal prep, cleaning, laundry, interpersonal relationship, driving, shopping, community activity, yard work, and   all household and community ambulation, kneeling, squatting, going outside, hiking,  housework, stairs, etc   PERSONAL FACTORS Age, Past/current experiences, Time since onset of injury/illness/exacerbation, and 3+ comorbidities:   arthritis, chronic neck and back pain, GERD, motion sickness, numbness at right outer thigh, plantar fasciitis left foot, PONY, left metatarsal arthrodesis for lisfranc fracture (2020), bladder suspension, cystocele repair, excision of morton's neuroma 2020, polypectomy are also affecting patient's functional outcome.    REHAB POTENTIAL: Good   CLINICAL DECISION MAKING: Stable/uncomplicated   EVALUATION COMPLEXITY: Low     GOALS: Goals reviewed with patient? No   SHORT TERM GOALS: Target date: 09/12/2021   Patient will be independent with initial home exercise program for self-management of symptoms. Baseline: Initial HEP to be provided at visit 2 as appropriate (08/29/21); initial HEP provided at visit 2 (09/05/2021);  Goal status: In-progress     LONG TERM GOALS: Target date: 11/21/2021   Patient will be independent with a long-term home exercise program for self-management of symptoms.  Baseline: Initial HEP to be provided at visit 2 as appropriate (08/29/21);  initial HEP  provided at visit 2 (09/05/2021);  Goal status: In-progress   2.  Patient will demonstrate improved FOTO to equal or greater than 64 by visit #15 to demonstrate improvement in overall condition and self-reported functional ability.  Baseline: 52 (08/29/21); Goal status: In-progress   3.  Patient will ambulate equal or greater than 1500 feet with no AD and with normal gait pattern on 6 Minute Walk Test.  Baseline: 1133 feet with no AD, antalgic gait favoring R LE, B toe in.   (08/29/21); Goal status: In-progress   4.  Patient will complete 5 Times Sit To Stand in equal or less than 11 seconds in surface 18.5 inches or lower with no UE support and equal weight bearing to demonstrate improved ability to complete transfers and demonstrate decreased fall risk.  Baseline: 15  seconds from 18.5 inch plinth, with B UE support on plinth, weight shifted to left side. (08/29/21); Goal status: In-progress   5.  Patient will complete community, work and/or recreational activities without limitation due to current condition.  Baseline: difficulty with all household and community ambulation, kneeling, squatting, going outside, hiking, housework, stairs, etc (08/29/21); Goal status: In-progress   PLAN: PT FREQUENCY: 1-2x/week   PT DURATION: 12 weeks   PLANNED INTERVENTIONS: Therapeutic exercises, Therapeutic activity, Neuromuscular re-education, Balance training, Gait training, Patient/Family education, Self Care, Joint mobilization, Stair training, DME instructions, Dry Needling, Electrical stimulation, Spinal manipulation, Cryotherapy, Moist heat, scar mobilization, Manual therapy, and Re-evaluation   PLAN FOR NEXT SESSION: update HEP as appropriate, progressive LE and functional strengthening, R knee ROM, and balance exercises. Manual as appropriate.    Salem Caster. Fairly IV, PT, DPT Physical Therapist- [facility]  10/25/2021, 2:47 PM

## 2021-11-01 ENCOUNTER — Ambulatory Visit: Payer: Medicare PPO | Admitting: Physical Therapy

## 2021-11-01 ENCOUNTER — Encounter: Payer: Self-pay | Admitting: Physical Therapy

## 2021-11-01 DIAGNOSIS — M25661 Stiffness of right knee, not elsewhere classified: Secondary | ICD-10-CM

## 2021-11-01 DIAGNOSIS — R262 Difficulty in walking, not elsewhere classified: Secondary | ICD-10-CM

## 2021-11-01 DIAGNOSIS — M25561 Pain in right knee: Secondary | ICD-10-CM | POA: Diagnosis not present

## 2021-11-01 DIAGNOSIS — G8929 Other chronic pain: Secondary | ICD-10-CM | POA: Diagnosis not present

## 2021-11-01 NOTE — Therapy (Signed)
OUTPATIENT PHYSICAL THERAPY TREATMENT NOTE / PROGRESS NOTE Dates of reporting from 08/29/2021 to 11/01/2021   Patient Name: Shannon [name] MRN: [id number] DOB:1951-01-17, 71 y.o., female Today's Date: 11/01/2021  PCP: Abner Greenspan, MD REFERRING PROVIDER: Marylynn Pearson. Erlinda Hong, MD  END OF SESSION:   PT End of Session - 11/01/21 1354     Visit Number 10    Number of Visits 24    Date for PT Re-Evaluation 11/21/21    Authorization Type [employer]IC[employer] reporting period from 08/29/2021    Authorization Time Period auth 24 visits   8/9 - 11/30    Authorization - Visit Number 10    Authorization - Number of Visits 24    Progress Note Due on Visit 10    PT Start Time 1350    PT Stop Time 1428    PT Time Calculation (min) 38 min    Activity Tolerance Patient tolerated treatment well    Behavior During Therapy WFL for tasks assessed/performed                 Past Medical History:  Diagnosis Date   Arthritis    left foot   Blood transfusion without reported diagnosis    Chronic neck and back pain    received physical therapy   Dysrhythmia    Pt told she had "benign arrythmia" after wearing monitor   GERD (gastroesophageal reflux disease)    with esophagitis   Motion sickness    boats, planes   Numbness    Right outer thigh, constant, notices more with standing for a long period of time   Plantar fasciitis    left   PONV (postoperative nausea and vomiting)    Past Surgical History:  Procedure Laterality Date   ARTHRODESIS METATARSAL Left 12/30/2018   Procedure: ARTHRODESIS,LISFRANC;MULTIPLE LEFT;  Surgeon: Samara Deist, DPM;  Location: [place];  Service: Podiatry;  Laterality: Left;  general with local   BLADDER SUSPENSION N/A 07/04/2015   Procedure: TRANSVAGINAL TAPE (TVT) SLING                   ;  Surgeon: Emily Filbert, MD;  Location: [facility];  Service: Gynecology;  Laterality: N/A;   COLONOSCOPY     COLONOSCOPY WITH PROPOFOL N/A 09/08/2019   Procedure:  COLONOSCOPY WITH PROPOFOL;  Surgeon: Virgel Manifold, MD;  Location: [facility];  Service: Endoscopy;  Laterality: N/A;   CYSTOCELE REPAIR N/A 07/04/2015   Procedure: ANTERIOR REPAIR (CYSTOCELE);  Surgeon: Emily Filbert, MD;  Location: [facility];  Service: Gynecology;  Laterality: N/A;   CYSTOSCOPY N/A 07/04/2015   Procedure: CYSTOSCOPY;  Surgeon: Emily Filbert, MD;  Location: [facility];  Service: Gynecology;  Laterality: N/A;   DILATION AND CURETTAGE OF UTERUS     EXCISION MORTON'S NEUROMA Left 08/12/2018   Procedure: EXCISION MORTON'S NEUROMA;  Surgeon: Samara Deist, DPM;  Location: [place];  Service: Podiatry;  Laterality: Left;  lma local   POLYPECTOMY     TOTAL KNEE ARTHROPLASTY Right 08/13/2021   Procedure: RIGHT TOTAL KNEE ARTHROPLASTY;  Surgeon: Leandrew Koyanagi, MD;  Location: [place];  Service: Orthopedics;  Laterality: Right;   TUBAL LIGATION  1987   UPPER GI ENDOSCOPY     VAGINAL HYSTERECTOMY N/A 07/04/2015   Procedure: TOTAL VAGINAL HYSTERECTOMY  ;  Surgeon: Emily Filbert, MD;  Location: [facility];  Service: Gynecology;  Laterality: N/A;   Patient Active Problem List   Diagnosis Date Noted  Status post total right knee replacement 08/13/2021   Pre-operative cardiovascular examination 06/10/2021   Pre-op examination 05/31/2021   Primary osteoarthritis of right knee 03/28/2021   Polyp of colon    History of PSVT (paroxysmal supraventricular tachycardia) 06/17/2017   Recurrent UTI 08/23/2016   Estrogen deficiency 12/06/2015   Elevated blood sugar 12/06/2015   Incontinence of urine in female 04/12/2015   Medicare annual wellness visit, subsequent 11/30/2014   Routine general medical examination at a health care facility 11/05/2012   PLANTAR FASCIITIS, LEFT 07/28/2009   PERSONAL HX COLONIC POLYPS 11/02/2007   GERD 10/20/2006    REFERRING DIAG: status post total right knee replacement  THERAPY DIAG:  Chronic pain of right knee  Stiffness of right knee, not elsewhere  classified  Difficulty in walking, not elsewhere classified  Rationale for Evaluation and Treatment: Rehabilitation  PERTINENT HISTORY: Patient is a 71 y.o. female who presents to outpatient physical therapy with a referral for medical diagnosis status post total right knee replacement. This patient's chief complaints consist of right knee pain, stiffness, and weakness and difficulty with walking leading to the following functional deficits: difficulty with all household and community ambulation, kneeling, squatting, going outside, hiking, housework, stairs, etc. Relevant past medical history and comorbidities include arthritis, chronic neck and back pain, GERD, motion sickness, numbness at right outer thigh, plantar fasciitis left foot, PONY, left metatarsal arthrodesis for lisfranc fracture (2020), bladder suspension, cystocele repair, excision of morton's neuroma 2020, polypectomy.  Patient denies hx of cancer, stroke, seizures, lung problems, heart problems, diabetes, unexplained weight loss, unexplained changes in bowel or bladder problems, unexplained stumbling or dropping things, osteoporosis, and spinal surgery    PRECAUTIONS: Fall, don't put pillow behind knee while resting.   SUBJECTIVE: Patient reports her knee has been mixed over the past week. She states she felt it was too much to do hamstring curls on the machine and then standing last session and her knee was swollen and sore after the visit. She did not do hamstring curls for 2 days after last session and it started to feel better. She then added them at home with 4# AW and it continues to bother her some. She is having troulble again sleeping on right side due to pain at the right side of her knee that takes half the day to feel better. Her knee has felt swollen at the back of her knee and she can feel it when stretching into flexion. She states she understands why it is important to strengthen the hamstrings. She is continuing to walk  4-5 miles a day and once she did 2 miles in a forest with uneven ground with no problems.   PAIN:  Are you having pain? denies  OBJECTIVE  SELF-REPORTED FUNCTION FOTO score: 82/100 (knee questionnaire)  PASSIVE RANGE OF MOTION (PROM) *Indicates pain 08/29/21 11/01/21 Date  Joint/Motion R/L R/L R/L  Knee        Extension 0/11 -3/ /  Flexoin 105/150 130/ /  Comments:    MUSCLE PERFORMANCE (MMT):  *Indicates pain 08/29/21 11/01/21 Date  Joint/Motion R/L R/L R/L  Hip        Flexion (L1, L2) 4/4+ / /  Abduction 5/4+* / /  Knee        Extension (L3) 5*/5 5/5 /  Flexion (S2) 4*/5 4+/5 /  Ankle/Foot        Dorsiflexion (L4) 5*/5 / /  Great toe extension (L5) 4+/4+ / /  Eversion (S1) 5/5 / /  Plantarflexion (  S1) 4+/4+ / /  Comments:   FUNCTIONAL/BALANCE TESTS: Five Time Sit to Stand (5TSTS): 18 seconds from 18.5 inch plinth with no UE support.    Six Minute Walk Test (6MWT): 1538 feet with no AD  PALPATION Very TTP at right fibular head. TTP over right lateral femoral epicondyle.   ACCESSORY MOTION Tender and stiff to attempts to move R fibular head AP or PA direction.    TODAY'S TREATMENT: Therapeutic exercise: to centralize symptoms and improve ROM, strength, muscular endurance, and activity tolerance required for successful completion of functional activities.  - NuStep level 5 using bilateral upper and lower extremities. Seat/handle setting 11/11. For improved extremity mobility, muscular endurance, and activity tolerance; and to induce the analgesic effect of aerobic exercise, stimulate improved joint nutrition, and prepare body structures and systems for following interventions. x 6  minutes. Average SPM = 74 - ambulation around clinic for distance in 6 min (see 6MWT above).  - sit <> stand from 18.5 inch plinth, 2x5 for speed (see 5TSTS above).  - ROM and MMT measurements to assess progress (see above).   Manual therapy: to reduce pain and tissue tension, improve range  of motion, neuromodulation, in order to promote improved ability to complete functional activities. SEATED  - palpation and accessory motion assessment of R knee (See above).  PRONE - STM to right lateral head of gastroc (tender).   Pt required multimodal cuing for proper technique and to facilitate improved neuromuscular control, strength, range of motion, and functional ability resulting in improved performance and form.    PATIENT EDUCATION:  Education details: Exercise purpose/form. Self management techniques. POC, progress Person educated: Patient Education method: Explanation, demonstration, tactile and verbal cuing Education comprehension: verbalized and demonstrated understanding and needs further education     HOME EXERCISE PROGRAM: Access Code: [email or web address] URL: [email or web address] Date: 10/09/2021 Prepared by: Rosita Kea  Exercises - Standing Knee Flexion Stretch on Step  - 2 x daily - 1 sets - 10 reps - 5 seconds hold - Reverse Lunge  - 2 x daily - 2 sets - 10 reps - Hamstring stretch (with strap)  - 2 x daily - 3 sets - 30 seconds hold - Seated Long Arc Quad with Ankle Weight  - 1-2 x daily - 3 sets - 10 reps - Single Leg Stance (SLS) Firm Ground With Vertical and Horizontal Head Turns  - 2 x daily - 2 sets - 10-20 reps - Side Stepping with Resistance at Ankles  - 2 x daily - 2 sets - 25 feet  Patient Education - Scar Massage  Standing R knee extension stretch (picture on phone), 1x20 with 5 second holds 2x per day.    ASSESSMENT:   CLINICAL IMPRESSION: Patient has attended 10 physical therapy treatment sessions since starting current episode of care on 08/29/2021. She has made excellent progress and met her short term goal and 3 of her 5 long term goals. She has been attending PT regularly at a rate of 1x per week as agreed upon by patient and PT. She has gained all needed flexion ROM in the right knee but is still lacking the last few degrees of  extension. She also lacks strength/power/endurance in R LE compared to left as is expected at this point in recovery and is likely to improve with continued strengthening. Patient has returned to most previous activities. However, she is stil having some trouble with sleeping and her knee has been more sore and flared up since adding hamstring curls  to her exercise program last PT session. She continues to be quite sore at the lateral knee and especially over the fibular head, which could explain increased pain with hamstring strengthening since biceps femoris has an attachment there. Plan to discontinue hamstring strengthening for a week, then introduce it back in gradually. Patient is likely nearing discharge from formal PT to independent management with long term HEP. Patient would benefit from continued management of limiting condition by skilled physical therapist to address remaining impairments and functional limitations to work towards stated goals and return to PLOF or maximal functional independence.     From initial PT eval on 08/29/2021 Patient is a 71 y.o. female referred to outpatient physical therapy with a medical diagnosis of status post total right knee replacement who presents with signs and symptoms consistent with right knee pain, stiffness, and dysfunction s/p R TKA on 08/13/2021. Patient presents with significant pain, joint stiffness, ROM, balance, gait, muscle performance (strength/power/endurance), edema, and activity tolerance impairments that are limiting ability to complete all household and community ambulation, kneeling, squatting, going outside, hiking, housework, stairs, etc, without difficulty. Patient will benefit from skilled physical therapy intervention to address current body structure impairments and activity limitations to improve function and work towards goals set in current POC in order to return to prior level of function or maximal functional improvement.    OBJECTIVE  IMPAIRMENTS Abnormal gait, decreased activity tolerance, decreased balance, decreased endurance, decreased knowledge of condition, decreased mobility, difficulty walking, decreased ROM, decreased strength, hypomobility, increased edema, impaired perceived functional ability, impaired flexibility, impaired sensation, improper body mechanics, and pain.    ACTIVITY LIMITATIONS carrying, lifting, bending, standing, squatting, stairs, transfers, bed mobility, bathing, dressing, and locomotion level   PARTICIPATION LIMITATIONS: meal prep, cleaning, laundry, interpersonal relationship, driving, shopping, community activity, yard work, and   all household and community ambulation, kneeling, squatting, going outside, hiking, housework, stairs, etc   PERSONAL FACTORS Age, Past/current experiences, Time since onset of injury/illness/exacerbation, and 3+ comorbidities:   arthritis, chronic neck and back pain, GERD, motion sickness, numbness at right outer thigh, plantar fasciitis left foot, PONY, left metatarsal arthrodesis for lisfranc fracture (2020), bladder suspension, cystocele repair, excision of morton's neuroma 2020, polypectomy are also affecting patient's functional outcome.    REHAB POTENTIAL: Good   CLINICAL DECISION MAKING: Stable/uncomplicated   EVALUATION COMPLEXITY: Low     GOALS: Goals reviewed with patient? No   SHORT TERM GOALS: Target date: 09/12/2021   Patient will be independent with initial home exercise program for self-management of symptoms. Baseline: Initial HEP to be provided at visit 2 as appropriate (08/29/21); initial HEP provided at visit 2 (09/05/2021);  Goal status: MET     LONG TERM GOALS: Target date: 11/21/2021   Patient will be independent with a long-term home exercise program for self-management of symptoms.  Baseline: Initial HEP to be provided at visit 2 as appropriate (08/29/21);  initial HEP provided at visit 2 (09/05/2021); excellent participation  (11/01/2021);  Goal status: In-progress   2.  Patient will demonstrate improved FOTO to equal or greater than 64 by visit #15 to demonstrate improvement in overall condition and self-reported functional ability.  Baseline: 52 (08/29/21); 82 at visit #10 (11/01/2021);  Goal status: Met 11/01/2021   3.  Patient will ambulate equal or greater than 1500 feet with no AD and with normal gait pattern on 6 Minute Walk Test.  Baseline: 1133 feet with no AD, antalgic gait favoring R LE, B toe in (08/29/21); 1538 feet with no  AD (11/01/2021);  Goal status: MET 11/01/2021   4.  Patient will complete 5 Times Sit To Stand in equal or less than 11 seconds in surface 18.5 inches or lower with no UE support and equal weight bearing to demonstrate improved ability to complete transfers and demonstrate decreased fall risk.  Baseline: 15 seconds from 18.5 inch plinth, with B UE support on plinth, weight shifted to left side. (08/29/21); 18 seconds from 18.5 inch plinth with no UE support (11/01/2021);  Goal status: MET 11/01/2021   5.  Patient will complete community, work and/or recreational activities without limitation due to current condition.  Baseline: difficulty with all household and community ambulation, kneeling, squatting, going outside, hiking, housework, stairs, etc (08/29/21); bothered in sleeping, kneeling, squatting still (11/01/2021);  Goal status: In-progress   PLAN: PT FREQUENCY: 1-2x/week   PT DURATION: 12 weeks   PLANNED INTERVENTIONS: Therapeutic exercises, Therapeutic activity, Neuromuscular re-education, Balance training, Gait training, Patient/Family education, Self Care, Joint mobilization, Stair training, DME instructions, Dry Needling, Electrical stimulation, Spinal manipulation, Cryotherapy, Moist heat, scar mobilization, Manual therapy, and Re-evaluation   PLAN FOR NEXT SESSION: update HEP as appropriate, progressive LE and functional strengthening, R knee ROM, and balance  exercises. Manual as appropriate.    Everlean Alstrom. Graylon Good, PT, DPT 11/01/21, 8:46 PM  [facility] [address] [place], [place] [address] P: [phone] I F: [phone]

## 2021-11-06 ENCOUNTER — Ambulatory Visit (INDEPENDENT_AMBULATORY_CARE_PROVIDER_SITE_OTHER): Payer: Medicare PPO | Admitting: Orthopaedic Surgery

## 2021-11-06 ENCOUNTER — Telehealth: Payer: Self-pay | Admitting: *Deleted

## 2021-11-06 DIAGNOSIS — Z96651 Presence of right artificial knee joint: Secondary | ICD-10-CM

## 2021-11-06 NOTE — Progress Notes (Signed)
Post-Op Visit Note   Patient: Shannon [name]           Date of Birth: 1950-09-20           MRN: [id number] Visit Date: 11/06/2021 PCP: Abner Greenspan, MD   Assessment & Plan:  Chief Complaint:  Chief Complaint  Patient presents with   Right Knee - Pain   Visit Diagnoses:  1. Status post total right knee replacement     Plan: Shannon [name] is now 3 months status post right total knee replacement on 08/13/2021.  Only complaint is that there is still some soreness and stiffness at times but overall she is doing well and pleased with the outcome.  Examination of the right knee shows fully healed surgical scar.  She has good varus valgus stability.  Range of motion is 3 to 130 degrees.  Shannon [name] is now walking 3 to 5 miles a day for exercise.  She has no complaints regarding the knee replacement.  She is very pleased with the outcome.  Dental prophylaxis reinforced.  Recheck in 3 months with two-view x-rays of the right knee.  Follow-Up Instructions: Return in about 3 months (around 02/06/2022).   Orders:  No orders of the defined types were placed in this encounter.  No orders of the defined types were placed in this encounter.   Imaging: No results found.  PMFS History: Patient Active Problem List   Diagnosis Date Noted   Status post total right knee replacement 08/13/2021   Pre-operative cardiovascular examination 06/10/2021   Pre-op examination 05/31/2021   Primary osteoarthritis of right knee 03/28/2021   Polyp of colon    History of PSVT (paroxysmal supraventricular tachycardia) 06/17/2017   Recurrent UTI 08/23/2016   Estrogen deficiency 12/06/2015   Elevated blood sugar 12/06/2015   Incontinence of urine in female 04/12/2015   Medicare annual wellness visit, subsequent 11/30/2014   Routine general medical examination at a health care facility 11/05/2012   PLANTAR FASCIITIS, LEFT 07/28/2009   PERSONAL HX COLONIC POLYPS 11/02/2007   GERD 10/20/2006   Past  Medical History:  Diagnosis Date   Arthritis    left foot   Blood transfusion without reported diagnosis    Chronic neck and back pain    received physical therapy   Dysrhythmia    Pt told she had "benign arrythmia" after wearing monitor   GERD (gastroesophageal reflux disease)    with esophagitis   Motion sickness    boats, planes   Numbness    Right outer thigh, constant, notices more with standing for a long period of time   Plantar fasciitis    left   PONV (postoperative nausea and vomiting)     Family History  Problem Relation Age of Onset   Stroke Mother        x 2   Heart disease Mother        congenital arrhythmia and CHF   Depression Mother    Atrial fibrillation Mother    Stomach cancer Paternal Uncle    Prostate cancer Brother    Atrial fibrillation Brother    Colon cancer Neg Hx    Esophageal cancer Neg Hx    Kidney cancer Neg Hx    Bladder Cancer Neg Hx    Breast cancer Neg Hx     Past Surgical History:  Procedure Laterality Date   ARTHRODESIS METATARSAL Left 12/30/2018   Procedure: ARTHRODESIS,LISFRANC;MULTIPLE LEFT;  Surgeon: Samara Deist, DPM;  Location: [place];  Service: Podiatry;  Laterality: Left;  general with local   BLADDER SUSPENSION N/A 07/04/2015   Procedure: TRANSVAGINAL TAPE (TVT) SLING                   ;  Surgeon: Emily Filbert, MD;  Location: [facility];  Service: Gynecology;  Laterality: N/A;   COLONOSCOPY     COLONOSCOPY WITH PROPOFOL N/A 09/08/2019   Procedure: COLONOSCOPY WITH PROPOFOL;  Surgeon: Virgel Manifold, MD;  Location: [facility];  Service: Endoscopy;  Laterality: N/A;   CYSTOCELE REPAIR N/A 07/04/2015   Procedure: ANTERIOR REPAIR (CYSTOCELE);  Surgeon: Emily Filbert, MD;  Location: [facility];  Service: Gynecology;  Laterality: N/A;   CYSTOSCOPY N/A 07/04/2015   Procedure: CYSTOSCOPY;  Surgeon: Emily Filbert, MD;  Location: [facility];  Service: Gynecology;  Laterality: N/A;   DILATION AND CURETTAGE OF UTERUS     EXCISION  MORTON'S NEUROMA Left 08/12/2018   Procedure: EXCISION MORTON'S NEUROMA;  Surgeon: Samara Deist, DPM;  Location: [place];  Service: Podiatry;  Laterality: Left;  lma local   POLYPECTOMY     TOTAL KNEE ARTHROPLASTY Right 08/13/2021   Procedure: RIGHT TOTAL KNEE ARTHROPLASTY;  Surgeon: Leandrew Koyanagi, MD;  Location: [place];  Service: Orthopedics;  Laterality: Right;   TUBAL LIGATION  1987   UPPER GI ENDOSCOPY     VAGINAL HYSTERECTOMY N/A 07/04/2015   Procedure: TOTAL VAGINAL HYSTERECTOMY  ;  Surgeon: Emily Filbert, MD;  Location: [facility];  Service: Gynecology;  Laterality: N/A;   Social History   Occupational History   Not on file  Tobacco Use   Smoking status: Never   Smokeless tobacco: Never  Vaping Use   Vaping Use: Never used  Substance and Sexual Activity   Alcohol use: No    Alcohol/week: 0.0 standard drinks of alcohol    Comment: rare   Drug use: No   Sexual activity: Not Currently    Birth control/protection: Post-menopausal, Surgical

## 2021-11-06 NOTE — Telephone Encounter (Signed)
Ortho bundle 90 day in office visit completed. 

## 2021-11-08 ENCOUNTER — Ambulatory Visit: Payer: Medicare PPO | Admitting: Urology

## 2021-11-08 ENCOUNTER — Encounter: Payer: Self-pay | Admitting: Physical Therapy

## 2021-11-08 ENCOUNTER — Ambulatory Visit: Payer: Medicare PPO | Admitting: Physical Therapy

## 2021-11-08 DIAGNOSIS — M25561 Pain in right knee: Secondary | ICD-10-CM | POA: Diagnosis not present

## 2021-11-08 DIAGNOSIS — G8929 Other chronic pain: Secondary | ICD-10-CM | POA: Diagnosis not present

## 2021-11-08 DIAGNOSIS — R262 Difficulty in walking, not elsewhere classified: Secondary | ICD-10-CM

## 2021-11-08 DIAGNOSIS — M25661 Stiffness of right knee, not elsewhere classified: Secondary | ICD-10-CM | POA: Diagnosis not present

## 2021-11-08 NOTE — Therapy (Signed)
OUTPATIENT PHYSICAL THERAPY TREATMENT NOTE / DISCHARGE SUMMARY Dates of reporting 08/29/2021 to 11/08/2021   Patient Name: Shannon [name] MRN: [id number] DOB:September 21, 1950, 71 y.o., female Today's Date: 11/08/2021  PCP: Abner Greenspan, MD REFERRING PROVIDER: Marylynn Pearson. Erlinda Hong, MD  END OF SESSION:   PT End of Session - 11/08/21 1639     Visit Number 11    Number of Visits 24    Date for PT Re-Evaluation 11/21/21    Authorization Type [employer] reporting period from 08/29/2021    Authorization Time Period auth 24 visits   8/9 - 11/30    Authorization - Visit Number 10    Authorization - Number of Visits 24    Progress Note Due on Visit 20    PT Start Time 1345    PT Stop Time 1425    PT Time Calculation (min) 40 min    Activity Tolerance Patient tolerated treatment well    Behavior During Therapy WFL for tasks assessed/performed                  Past Medical History:  Diagnosis Date   Arthritis    left foot   Blood transfusion without reported diagnosis    Chronic neck and back pain    received physical therapy   Dysrhythmia    Pt told she had "benign arrythmia" after wearing monitor   GERD (gastroesophageal reflux disease)    with esophagitis   Motion sickness    boats, planes   Numbness    Right outer thigh, constant, notices more with standing for a long period of time   Plantar fasciitis    left   PONV (postoperative nausea and vomiting)    Past Surgical History:  Procedure Laterality Date   ARTHRODESIS METATARSAL Left 12/30/2018   Procedure: ARTHRODESIS,LISFRANC;MULTIPLE LEFT;  Surgeon: Samara Deist, DPM;  Location: [facility];  Service: Podiatry;  Laterality: Left;  general with local   BLADDER SUSPENSION N/A 07/04/2015   Procedure: TRANSVAGINAL TAPE (TVT) SLING                   ;  Surgeon: Emily Filbert, MD;  Location: [facility];  Service: Gynecology;  Laterality: N/A;   COLONOSCOPY     COLONOSCOPY WITH PROPOFOL N/A 09/08/2019   Procedure:  COLONOSCOPY WITH PROPOFOL;  Surgeon: Virgel Manifold, MD;  Location: [facility];  Service: Endoscopy;  Laterality: N/A;   CYSTOCELE REPAIR N/A 07/04/2015   Procedure: ANTERIOR REPAIR (CYSTOCELE);  Surgeon: Emily Filbert, MD;  Location: [facility];  Service: Gynecology;  Laterality: N/A;   CYSTOSCOPY N/A 07/04/2015   Procedure: CYSTOSCOPY;  Surgeon: Emily Filbert, MD;  Location: [facility];  Service: Gynecology;  Laterality: N/A;   DILATION AND CURETTAGE OF UTERUS     EXCISION MORTON'S NEUROMA Left 08/12/2018   Procedure: EXCISION MORTON'S NEUROMA;  Surgeon: Samara Deist, DPM;  Location: [facility];  Service: Podiatry;  Laterality: Left;  lma local   POLYPECTOMY     TOTAL KNEE ARTHROPLASTY Right 08/13/2021   Procedure: RIGHT TOTAL KNEE ARTHROPLASTY;  Surgeon: Leandrew Koyanagi, MD;  Location: [facility];  Service: Orthopedics;  Laterality: Right;   TUBAL LIGATION  1987   UPPER GI ENDOSCOPY     VAGINAL HYSTERECTOMY N/A 07/04/2015   Procedure: TOTAL VAGINAL HYSTERECTOMY  ;  Surgeon: Emily Filbert, MD;  Location: [facility];  Service: Gynecology;  Laterality: N/A;   Patient Active Problem List   Diagnosis Date Noted  Status post total right knee replacement 08/13/2021   Pre-operative cardiovascular examination 06/10/2021   Pre-op examination 05/31/2021   Primary osteoarthritis of right knee 03/28/2021   Polyp of colon    History of PSVT (paroxysmal supraventricular tachycardia) 06/17/2017   Recurrent UTI 08/23/2016   Estrogen deficiency 12/06/2015   Elevated blood sugar 12/06/2015   Incontinence of urine in female 04/12/2015   Medicare annual wellness visit, subsequent 11/30/2014   Routine general medical examination at a health care facility 11/05/2012   PLANTAR FASCIITIS, LEFT 07/28/2009   PERSONAL HX COLONIC POLYPS 11/02/2007   GERD 10/20/2006    REFERRING DIAG: status post total right knee replacement  THERAPY DIAG:  Chronic pain of right knee  Stiffness of right knee, not elsewhere  classified  Difficulty in walking, not elsewhere classified  Rationale for Evaluation and Treatment: Rehabilitation  PERTINENT HISTORY: Patient is a 71 y.o. female who presents to outpatient physical therapy with a referral for medical diagnosis status post total right knee replacement. This patient's chief complaints consist of right knee pain, stiffness, and weakness and difficulty with walking leading to the following functional deficits: difficulty with all household and community ambulation, kneeling, squatting, going outside, hiking, housework, stairs, etc. Relevant past medical history and comorbidities include arthritis, chronic neck and back pain, GERD, motion sickness, numbness at right outer thigh, plantar fasciitis left foot, PONY, left metatarsal arthrodesis for lisfranc fracture (2020), bladder suspension, cystocele repair, excision of morton's neuroma 2020, polypectomy.  Patient denies hx of cancer, stroke, seizures, lung problems, heart problems, diabetes, unexplained weight loss, unexplained changes in bowel or bladder problems, unexplained stumbling or dropping things, osteoporosis, and spinal surgery    PRECAUTIONS: Fall, don't put pillow behind knee while resting.   SUBJECTIVE: Patient reports her right knee has been feeling better over the last week than it ever has since surgery. She states her pain resolved after discontinuing hamstring curl, so she added it back into her HEP initially with AROM and now up to 2# AW. She states she feels ready to discharge from PT today as long as she receives a long term HEP. Dr. Erlinda Hong was pleased with her progress and reassured her the pain she was having at the lateral knee is normal for this stage of recovery.   PAIN:  Are you having pain? denies  OBJECTIVE  SELF-REPORTED FUNCTION FOTO score: 86/100 (knee questionnaire)  FUNCTIONAL/BALANCE TESTS: Five Time Sit to Stand (5TSTS): 9.5 seconds from 18.5 inch plinth with no UE support.     TODAY'S TREATMENT: Therapeutic exercise: to centralize symptoms and improve ROM, strength, muscular endurance, and activity tolerance required for successful completion of functional activities.  - NuStep level 5 using bilateral upper and lower extremities. Seat/handle setting 11/11. For improved extremity mobility, muscular endurance, and activity tolerance; and to induce the analgesic effect of aerobic exercise, stimulate improved joint nutrition, and prepare body structures and systems for following interventions. x 6  minutes. Average SPM = 74 - SLS with ball toss on R LE, 1x20 seconds.  - SLS with head turns, 1x10 each foot - reverse lunge with U UE support, 1x10 each side.  - standing lunges on 3rd step of stairs, 1x10 each side.  - staggered stance sit <> stand from 18 inch chair with left foot frong, 1x10 (more difficult but not too painful).  - sit <> stand from 18.5 inch plinth, 1x5 for speed (see 5TSTS above).  - lateral walking 1x20 feet each side with BlueTB around ankles - forwards monster  walking 2x20 feet with BlueTB around ankles.    Pt required minimal cuing for proper technique and to facilitate improved neuromuscular control, strength, range of motion, and functional ability resulting in improved performance and form.    PATIENT EDUCATION:  Education details: Exercise purpose/form. Self management techniques. POC, discharge reccomendations.  Person educated: Patient Education method: Explanation, demonstration, tactile and verbal cuing Education comprehension: verbalized and demonstrated understanding      HOME EXERCISE PROGRAM: Access Code: [email or web address] URL: [email or web address] Date: 11/08/2021 Prepared by: Rosita Kea  Exercises - Standing Knee Flexion Stretch on Step  - 1 x daily - 1 sets - 10 reps - 5 seconds hold - Hamstring stretch (with strap)  - 1 x daily - 3 sets - 30 seconds hold - Single Leg Stance (SLS) Firm Ground With Vertical and  Horizontal Head Turns  - 1 x daily - 3 sets - 20 reps - Staggered Sit-to-Stand  - 3 x weekly - 3 sets - 10 reps - Standing Alternating Knee Flexion with Ankle Weights  - 3 x weekly - 3 sets - 10 reps - Seated Long Arc Quad with Ankle Weight  - 3 x weekly - 3 sets - 10 reps - Side Stepping with Resistance at Ankles  - 3 x weekly - 3 sets - 30 feet - Walking    ASSESSMENT:   CLINICAL IMPRESSION: Patient has attended 11 physical therapy treatment sessions since starting current episode of care on 08/29/2021. She has made excellent progress and met her short term and longer term goals. She has been attending PT regularly at a rate of 1x per week as agreed upon by patient and PT. She has gained all needed flexion ROM in the right knee but is still lacking the last few degrees of extension. She also lacks strength/power/endurance in R LE compared to left as is expected at this point in recovery and is likely to improve with continued strengthening. Patient has returned to most previous activities and appears ready for discharge to long term HEP, which was provided today. Patient is now discharged from PT due to meeting her goals.    From initial PT eval on 08/29/2021 Patient is a 71 y.o. female referred to outpatient physical therapy with a medical diagnosis of status post total right knee replacement who presents with signs and symptoms consistent with right knee pain, stiffness, and dysfunction s/p R TKA on 08/13/2021. Patient presents with significant pain, joint stiffness, ROM, balance, gait, muscle performance (strength/power/endurance), edema, and activity tolerance impairments that are limiting ability to complete all household and community ambulation, kneeling, squatting, going outside, hiking, housework, stairs, etc, without difficulty. Patient will benefit from skilled physical therapy intervention to address current body structure impairments and activity limitations to improve function and work  towards goals set in current POC in order to return to prior level of function or maximal functional improvement.    OBJECTIVE IMPAIRMENTS Abnormal gait, decreased activity tolerance, decreased balance, decreased endurance, decreased knowledge of condition, decreased mobility, difficulty walking, decreased ROM, decreased strength, hypomobility, increased edema, impaired perceived functional ability, impaired flexibility, impaired sensation, improper body mechanics, and pain.    ACTIVITY LIMITATIONS carrying, lifting, bending, standing, squatting, stairs, transfers, bed mobility, bathing, dressing, and locomotion level   PARTICIPATION LIMITATIONS: meal prep, cleaning, laundry, interpersonal relationship, driving, shopping, community activity, yard work, and   all household and community ambulation, kneeling, squatting, going outside, hiking, housework, stairs, etc   PERSONAL FACTORS Age, Past/current experiences, Time since onset of injury/illness/exacerbation, and  3+ comorbidities:   arthritis, chronic neck and back pain, GERD, motion sickness, numbness at right outer thigh, plantar fasciitis left foot, PONY, left metatarsal arthrodesis for lisfranc fracture (2020), bladder suspension, cystocele repair, excision of morton's neuroma 2020, polypectomy are also affecting patient's functional outcome.    REHAB POTENTIAL: Good   CLINICAL DECISION MAKING: Stable/uncomplicated   EVALUATION COMPLEXITY: Low     GOALS: Goals reviewed with patient? No   SHORT TERM GOALS: Target date: 09/12/2021   Patient will be independent with initial home exercise program for self-management of symptoms. Baseline: Initial HEP to be provided at visit 2 as appropriate (08/29/21); initial HEP provided at visit 2 (09/05/2021);  Goal status: MET     LONG TERM GOALS: Target date: 11/21/2021   Patient will be independent with a long-term home exercise program for self-management of symptoms.  Baseline: Initial HEP to be  provided at visit 2 as appropriate (08/29/21);  initial HEP provided at visit 2 (09/05/2021); excellent participation (11/01/2021);  Goal status: MET   2.  Patient will demonstrate improved FOTO to equal or greater than 64 by visit #15 to demonstrate improvement in overall condition and self-reported functional ability.  Baseline: 52 (08/29/21); 82 at visit #10 (11/01/2021);  86 at visit #11 (11/08/2021);  Goal status: Met 11/01/2021   3.  Patient will ambulate equal or greater than 1500 feet with no AD and with normal gait pattern on 6 Minute Walk Test.  Baseline: 1133 feet with no AD, antalgic gait favoring R LE, B toe in (08/29/21); 1538 feet with no AD (11/01/2021);  Goal status: MET 11/01/2021   4.  Patient will complete 5 Times Sit To Stand in equal or less than 11 seconds in surface 18.5 inches or lower with no UE support and equal weight bearing to demonstrate improved ability to complete transfers and demonstrate decreased fall risk.  Baseline: 15 seconds from 18.5 inch plinth, with B UE support on plinth, weight shifted to left side. (08/29/21); 18 seconds from 18.5 inch plinth with no UE support (11/01/2021); 9.5 seconds from 18.5 inch plinth with no UE support (11/08/2021);  Goal status: MET 11/08/2021   5.  Patient will complete community, work and/or recreational activities without limitation due to current condition.  Baseline: difficulty with all household and community ambulation, kneeling, squatting, going outside, hiking, housework, stairs, etc (08/29/21); bothered in sleeping, kneeling, squatting still (11/01/2021); sleeping fine now, kneeling is fine now on a cushion, squatting is near baseline (11/08/2021);  Goal status: MET   PLAN: PT FREQUENCY: 1-2x/week   PT DURATION: 12 weeks   PLANNED INTERVENTIONS: Therapeutic exercises, Therapeutic activity, Neuromuscular re-education, Balance training, Gait training, Patient/Family education, Self Care, Joint mobilization, Stair  training, DME instructions, Dry Needling, Electrical stimulation, Spinal manipulation, Cryotherapy, Moist heat, scar mobilization, Manual therapy, and Re-evaluation   PLAN FOR NEXT SESSION: Patient is now discharged from [facility]. Graylon Good, PT, DPT 11/08/21, 7:30 PM  [facility] [address] [place], [place] [address] P: [phone] I F: [phone]

## 2021-11-15 ENCOUNTER — Encounter: Payer: Medicare PPO | Admitting: Physical Therapy

## 2021-11-19 NOTE — Progress Notes (Unsigned)
11/20/2021 1:50 PM   Shannon [name] 1950/10/19 [id number]  Referring provider: Abner Greenspan, MD [address] [place], [place],  [place] [address]  Urological history: 1. rUTI's -contributing factors of age, incontinence, constipation and vaginal atrophy -documented urine cultures over the last year  09/26/2021 - MUF  09/14/2021 - No growth -managed with increased fluid intake, good perineal hygiene, avoiding tub baths and vaginal moisturizer   2. OAB/Urgency -managed with Myrbetriq 50 mg daily and pumpkin seed oil  -cysto 10/2018 NED   3. Incontinence -TVT Sling by gynecologist in 06/2015 during hysterectomy with anterior repair  -managed with pessary    Chief Complaint  Patient presents with   Hematuria     HPI: Shannon [name] is a 71 y.o. female who presents today for possible UTI.  At her visit on September 14, 2021, she stated she had a Foley placed during her right knee replacement on August 13, 2021.  She contacted the folks at the e-visit on August 20 for symptoms of difficulty passing urine and exaggerated sensation of the need to pass urine and urinary frequency that started that day.  She was prescribed Keflex 500 mg twice daily for 7 days.    She was not getting better on the antibiotic as she was she is getting up 12 times nightly that started at the same time.  She noticed that last night she was only getting up 4 times nightly.  UA few bacteria.  Urine culture was negative.  She was contacted via MyChart over the weekend and stated her symptoms abated.  On the morning of 09/26/2021, she contacted the office requesting an urgent visit for UTI and pain.  She stated that the day before she felt that the UTI may have been starting.  That morning she woke up at 4:15 AM with frequency, urgency, dysuria and urge incontinence.  She states she noted her urine was cloudy.  She did have some home test strips for UTI and it was positive for leukocytes.  UA greater  than 30 WBCs, 11-30 RBCs and a few bacteria.  KUB (09/2021) -moderate stool burden, no obvious urinary calculi are seen.  Urine culture grew out MUF.  She underwent a CT urogram on 10/16/2021 - which revealed no explanations for her symptoms or the micro heme, but her distal left ureter was poorly opacified.    At her visit on 10/23/2021, she stated that the intense frequency, urgency and nocturia have abated.  She no longer felt like she had a urinary tract infection.  She stated the symptoms went away quite quickly.  Now, she was experiencing her baseline frequency, urgency and incontinence.  She wanted to see if Logan Bores will work better in controlling her urinary symptoms.    Today, she indicated on her OAB questionnaire, that she is urinating 8 or more times during the day, nocturia x3 or more with a strong urge to urinate.  She has leakage with urge incontinence.  She is leaking 3 or more times weekly.  She wears 1 absorbent pad daily.  She does engage in fluid restriction and toilet mapping.   I questioned her further because the way she answered the questions on the questionnaire it appeared to me that her symptoms were severe.  She states the strong urge to urinate is not very frequent and she is up 2-3 times nightly rather than the 3 or more she indicated on the questionnaire.  Patient denies any modifying or aggravating factors.  Patient denies any gross hematuria, dysuria or suprapubic/flank pain.  Patient denies any fevers, chills, nausea or vomiting.    UA yellow clear, specific gravity 1.020, pH 5.5, WBC 0-5, RBC 0-2, 0-10 epithelial cells and a few bacteria.   PVR 12 mL  She feels that the Logan Bores is helping and would like to continue the medication.  She has noticed a decrease in the urgency with the Gemtesa.  PMH: Past Medical History:  Diagnosis Date   Arthritis    left foot   Blood transfusion without reported diagnosis    Chronic neck and back pain    received physical  therapy   Dysrhythmia    Pt told she had "benign arrythmia" after wearing monitor   GERD (gastroesophageal reflux disease)    with esophagitis   Motion sickness    boats, planes   Numbness    Right outer thigh, constant, notices more with standing for a long period of time   Plantar fasciitis    left   PONV (postoperative nausea and vomiting)     Surgical History: Past Surgical History:  Procedure Laterality Date   ARTHRODESIS METATARSAL Left 12/30/2018   Procedure: ARTHRODESIS,LISFRANC;MULTIPLE LEFT;  Surgeon: Samara Deist, DPM;  Location: [facility];  Service: Podiatry;  Laterality: Left;  general with local   BLADDER SUSPENSION N/A 07/04/2015   Procedure: TRANSVAGINAL TAPE (TVT) SLING                   ;  Surgeon: Emily Filbert, MD;  Location: [facility];  Service: Gynecology;  Laterality: N/A;   COLONOSCOPY     COLONOSCOPY WITH PROPOFOL N/A 09/08/2019   Procedure: COLONOSCOPY WITH PROPOFOL;  Surgeon: Virgel Manifold, MD;  Location: [facility];  Service: Endoscopy;  Laterality: N/A;   CYSTOCELE REPAIR N/A 07/04/2015   Procedure: ANTERIOR REPAIR (CYSTOCELE);  Surgeon: Emily Filbert, MD;  Location: [facility];  Service: Gynecology;  Laterality: N/A;   CYSTOSCOPY N/A 07/04/2015   Procedure: CYSTOSCOPY;  Surgeon: Emily Filbert, MD;  Location: [facility];  Service: Gynecology;  Laterality: N/A;   DILATION AND CURETTAGE OF UTERUS     EXCISION MORTON'S NEUROMA Left 08/12/2018   Procedure: EXCISION MORTON'S NEUROMA;  Surgeon: Samara Deist, DPM;  Location: [facility];  Service: Podiatry;  Laterality: Left;  lma local   POLYPECTOMY     TOTAL KNEE ARTHROPLASTY Right 08/13/2021   Procedure: RIGHT TOTAL KNEE ARTHROPLASTY;  Surgeon: Leandrew Koyanagi, MD;  Location: [facility];  Service: Orthopedics;  Laterality: Right;   TUBAL LIGATION  1987   UPPER GI ENDOSCOPY     VAGINAL HYSTERECTOMY N/A 07/04/2015   Procedure: TOTAL VAGINAL HYSTERECTOMY  ;  Surgeon: Emily Filbert, MD;  Location: [facility];   Service: Gynecology;  Laterality: N/A;    Home Medications:  Allergies as of 11/20/2021       Reactions   Adhesive [tape] Rash   (not sure if rash was from ointment or tape)   Alpha-gal Rash   (red meat S/P tick bite)   Neomycin-bacitracin Zn-polymyx Rash   Ointment   Nickel Rash        Medication List        Accurate as of November 20, 2021  1:50 PM. If you have any questions, ask your nurse or doctor.          STOP taking these medications    HYDROcodone-acetaminophen 5-325 MG tablet Commonly known as: Norco Stopped by: Zara Council, PA-C  methocarbamol 750 MG tablet Commonly known as: Robaxin-750 Stopped by: Zara Council, PA-C   mirabegron ER 50 MG Tb24 tablet Commonly known as: Myrbetriq Stopped by: Zara Council, PA-C   oxyCODONE-acetaminophen 5-325 MG tablet Commonly known as: Percocet Stopped by: Chisa Kushner, PA-C   promethazine 12.5 MG tablet Commonly known as: PHENERGAN Stopped by: Anika Shore, PA-C   traMADol 50 MG tablet Commonly known as: ULTRAM Stopped by: Zara Council, PA-C       TAKE these medications    aspirin EC 81 MG tablet Take 1 tablet (81 mg total) by mouth 2 (two) times daily. To be taken after surgery to prevent blood clots.  Swallow whole.   cholecalciferol 25 MCG (1000 UNIT) tablet Commonly known as: VITAMIN D3 Take 1,000 Units by mouth daily.   diclofenac Sodium 1 % Gel Commonly known as: VOLTAREN Apply 1 Application topically 4 (four) times daily as needed (pain).   docusate sodium 100 MG capsule Commonly known as: Colace Take 1 capsule (100 mg total) by mouth daily as needed.   famotidine 20 MG tablet Commonly known as: PEPCID Take 20 mg by mouth daily as needed for heartburn or indigestion.   GAMMA AMINOBUTYRIC ACID PO Take 500 mg by mouth daily.   Gemtesa 75 MG Tabs Generic drug: Vibegron Take 75 mg by mouth daily. Started by: Zara Council, PA-C   MAGNESIUM GLYCINATE PO Take 400  mg by mouth daily.   melatonin 5 MG Tabs Take 5 mg by mouth at bedtime.   multivitamin tablet Take 1 tablet by mouth daily.   ondansetron 4 MG tablet Commonly known as: Zofran Take 1 tablet (4 mg total) by mouth every 8 (eight) hours as needed for nausea or vomiting.   PHAZYME GAS & ACID MAX ST PO Take 1 tablet by mouth as needed (bloating).   PUMPKIN SEED OIL PO Take 200 mg by mouth in the morning and at bedtime.   St Johns Wort 300 MG Tabs Take 600 mg by mouth in the morning and at bedtime.        Allergies:  Allergies  Allergen Reactions   Adhesive [Tape] Rash    (not sure if rash was from ointment or tape)   Alpha-Gal Rash    (red meat S/P tick bite)   Neomycin-Bacitracin Zn-Polymyx Rash    Ointment   Nickel Rash    Family History: Family History  Problem Relation Age of Onset   Stroke Mother        x 2   Heart disease Mother        congenital arrhythmia and CHF   Depression Mother    Atrial fibrillation Mother    Stomach cancer Paternal Uncle    Prostate cancer Brother    Atrial fibrillation Brother    Colon cancer Neg Hx    Esophageal cancer Neg Hx    Kidney cancer Neg Hx    Bladder Cancer Neg Hx    Breast cancer Neg Hx     Social History:  reports that she has never smoked. She has never used smokeless tobacco. She reports that she does not drink alcohol and does not use drugs.  ROS: Pertinent ROS in HPI  Physical Exam: BP 107/65   Pulse 68   Ht '5\' 8"'$  (1.727 m)   Wt 158 lb (71.7 kg)   BMI 24.02 kg/m   Constitutional:  Well nourished. Alert and oriented, No acute distress. HEENT: [place] AT, moist mucus membranes.  Trachea midline Cardiovascular: No clubbing,  cyanosis, or edema. Respiratory: Normal respiratory effort, no increased work of breathing. Neurologic: Grossly intact, no focal deficits, moving all 4 extremities. Psychiatric: Normal mood and affect.    Laboratory Data: N/A  Pertinent Imaging:  11/20/21 13:30  Scan Result 12      Assessment & Plan:    1. High risk hematuria -non-smoker -cysto (10/2018) - NED - CT urogram (09/2021) - poorly opacified ureter on the left  - no reports of gross heme - UA negative for micro heme  2.  OAB -felt that the [place] improved her symptoms, so she would like to continue that medication -A prescription is sent to the pharmacy and I have given her samples so that if the prescription is not covered or is cost prohibitive it will give her time with the medication while we sort things out  Return in about 6 months (around 05/21/2022) for PVR and OAB questionnaire.  These notes generated with voice recognition software. I apologize for typographical errors.  [place], [place] [address]  [place], [place] [address] [phone]

## 2021-11-20 ENCOUNTER — Ambulatory Visit: Payer: Medicare PPO | Admitting: Urology

## 2021-11-20 ENCOUNTER — Encounter: Payer: Self-pay | Admitting: Urology

## 2021-11-20 VITALS — BP 107/65 | HR 68 | Ht 68.0 in | Wt 158.0 lb

## 2021-11-20 DIAGNOSIS — R319 Hematuria, unspecified: Secondary | ICD-10-CM | POA: Diagnosis not present

## 2021-11-20 DIAGNOSIS — N3281 Overactive bladder: Secondary | ICD-10-CM | POA: Diagnosis not present

## 2021-11-20 LAB — URINALYSIS, COMPLETE
Bilirubin, UA: NEGATIVE
Glucose, UA: NEGATIVE
Ketones, UA: NEGATIVE
Leukocytes,UA: NEGATIVE
Nitrite, UA: NEGATIVE
Protein,UA: NEGATIVE
RBC, UA: NEGATIVE
Specific Gravity, UA: 1.02 (ref 1.005–1.030)
Urobilinogen, Ur: 0.2 mg/dL (ref 0.2–1.0)
pH, UA: 5.5 (ref 5.0–7.5)

## 2021-11-20 LAB — MICROSCOPIC EXAMINATION

## 2021-11-20 LAB — BLADDER SCAN AMB NON-IMAGING: Scan Result: 12

## 2021-11-20 MED ORDER — GEMTESA 75 MG PO TABS: 75.0000 mg | ORAL_TABLET | Freq: Every day | ORAL | 3 refills | Status: DC

## 2021-12-21 DIAGNOSIS — H2513 Age-related nuclear cataract, bilateral: Secondary | ICD-10-CM | POA: Diagnosis not present

## 2022-01-17 ENCOUNTER — Telehealth: Payer: Self-pay | Admitting: *Deleted

## 2022-01-17 ENCOUNTER — Encounter: Payer: Self-pay | Admitting: *Deleted

## 2022-01-17 NOTE — Patient Instructions (Signed)
Visit Information  Thank you for taking time to visit with me today. Please don't hesitate to contact me if I can be of assistance to you.  Following are the goals we discussed today:  Continue exercises/walking daily.   Please call the Suicide and Crisis Lifeline: 988 call the [place] National Suicide Prevention Lifeline: (226) 046-3240 or TTY: 434-240-3896 TTY (234)008-8803) to talk to a trained counselor call 1-800-273-TALK (toll free, 24 hour hotline) call 911 if you are experiencing a Mental Health or [place] or need someone to talk to.  Patient verbalizes understanding of instructions and care plan provided today and agrees to view in [place]. Active MyChart status and patient understanding of how to access instructions and care plan via MyChart confirmed with patient.     The patient has been provided with contact information for the care management team and has been advised to call with any health related questions or concerns.   Valente David, RN, MSN, [facility] Care Management Coordinator [phone]

## 2022-01-17 NOTE — Patient Outreach (Signed)
  Care Coordination   Initial Visit Note   01/17/2022 Name: Shannon [name] MRN: [id number] DOB: May 27, 1950  Shannon [name] is a 71 y.o. year old female who sees Tower, Wynelle Fanny, MD for primary care. I spoke with  Shannon [name] by phone today.  What matters to the patients health and wellness today?  State she is doing well, continues to recover from knee surgery.  Does not feel she need follow up calls, but will call with questions.     Goals Addressed             This Visit's Progress    COMPLETED: Care Coordination Activities - No follow up needed       Care Coordination Interventions: Evaluation of current treatment plan related to Total knee replacement 5 months ago and patient's adherence to plan as established by provider Advised patient to continue PT sessions and increasing exercise as tolerated.  Walks several miles a day Reviewed medications with patient and discussed adherence and affordability.  Report [name] is the most expensive, costing $100 for a 3 month supply Reviewed scheduled/upcoming provider appointments including Ortho follow up 1/23, Urology on 5/1, and AWV on 5/20 Screening for signs and symptoms of depression related to chronic disease state  Assessed social determinant of health barriers         SDOH assessments and interventions completed:  Yes  SDOH Interventions Today    Flowsheet Row Most Recent Value  SDOH Interventions   Food Insecurity Interventions Intervention Not Indicated  Housing Interventions Intervention Not Indicated  Transportation Interventions Intervention Not Indicated        Care Coordination Interventions:  Yes, provided   Follow up plan: No further intervention required.   Encounter Outcome:  Pt. Visit Completed   Valente David, RN, MSN, [facility] Care Management Coordinator [phone]

## 2022-02-04 ENCOUNTER — Telehealth: Payer: Self-pay | Admitting: Orthopaedic Surgery

## 2022-02-04 ENCOUNTER — Other Ambulatory Visit: Payer: Self-pay

## 2022-02-04 MED ORDER — AMOXICILLIN 500 MG PO TABS: ORAL_TABLET | ORAL | 2 refills | Status: DC

## 2022-02-04 NOTE — Telephone Encounter (Signed)
Called and notified patient

## 2022-02-04 NOTE — Telephone Encounter (Signed)
Pt called requesting antibiotic for upcoming dental appt. Pt states her appt is next week. Please send to pharmacy on file. Pt phone number is [phone].

## 2022-02-12 ENCOUNTER — Ambulatory Visit: Payer: Medicare PPO | Admitting: Orthopaedic Surgery

## 2022-02-12 ENCOUNTER — Ambulatory Visit (INDEPENDENT_AMBULATORY_CARE_PROVIDER_SITE_OTHER): Payer: Medicare PPO

## 2022-02-12 ENCOUNTER — Encounter: Payer: Self-pay | Admitting: Orthopaedic Surgery

## 2022-02-12 DIAGNOSIS — Z96651 Presence of right artificial knee joint: Secondary | ICD-10-CM

## 2022-02-12 NOTE — Progress Notes (Signed)
Office Visit Note   Patient: Shannon [name]           Date of Birth: 1950/08/12           MRN: [id number] Visit Date: 02/12/2022              Requested by: Tower, Wynelle Fanny, MD [place],   [id number] PCP: Abner Greenspan, MD   Assessment & Plan: Visit Diagnoses:  1. Status post total right knee replacement     Plan: Rickeya is doing very well for the 80-monthmark.  She has resumed her daily activities and she feels that overall she is better than she was before surgery.  Dental prophylaxis reinforced.  Recheck in another 6 months with two-view x-rays of the right knee.  Follow-Up Instructions: Return in about 6 months (around 08/13/2022).   Orders:  Orders Placed This Encounter  Procedures   XR Knee 1-2 Views Right   No orders of the defined types were placed in this encounter.     Procedures: No procedures performed   Clinical Data: No additional findings.   Subjective: Chief Complaint  Patient presents with   Right Knee - Follow-up    Right total knee arthroplasty 08/13/2021    HPI Patient is 6 months status post right total knee replacement on 08/13/2021.  She is overall doing well and only complaint is some stiffness after activity or immobility.  She feels she is about 95% recovered.  Overall pleased.  Review of Systems   Objective: Vital Signs: There were no vitals taken for this visit.  Physical Exam  Ortho Exam Examination of the right knee shows a fully healed surgical scar.  She has excellent range of motion.  No joint effusion. Specialty Comments:  No specialty comments available.  Imaging: No results found.   PMFS History: Patient Active Problem List   Diagnosis Date Noted   Status post total right knee replacement 08/13/2021   Pre-operative cardiovascular examination 06/10/2021   Pre-op examination 05/31/2021   Primary osteoarthritis of right knee 03/28/2021   Polyp of colon    History of PSVT (paroxysmal  supraventricular tachycardia) 06/17/2017   Recurrent UTI 08/23/2016   Estrogen deficiency 12/06/2015   Elevated blood sugar 12/06/2015   Incontinence of urine in female 04/12/2015   Medicare annual wellness visit, subsequent 11/30/2014   Routine general medical examination at a health care facility 11/05/2012   PLANTAR FASCIITIS, LEFT 07/28/2009   PERSONAL HX COLONIC POLYPS 11/02/2007   GERD 10/20/2006   Past Medical History:  Diagnosis Date   Arthritis    left foot   Blood transfusion without reported diagnosis    Chronic neck and back pain    received physical therapy   Dysrhythmia    Pt told she had "benign arrythmia" after wearing monitor   GERD (gastroesophageal reflux disease)    with esophagitis   Motion sickness    boats, planes   Numbness    Right outer thigh, constant, notices more with standing for a long period of time   Plantar fasciitis    left   PONV (postoperative nausea and vomiting)     Family History  Problem Relation Age of Onset   Stroke Mother        x 2   Heart disease Mother        congenital arrhythmia and CHF   Depression Mother    Atrial fibrillation Mother    Stomach cancer Paternal Uncle  Prostate cancer Brother    Atrial fibrillation Brother    Colon cancer Neg Hx    Esophageal cancer Neg Hx    Kidney cancer Neg Hx    Bladder Cancer Neg Hx    Breast cancer Neg Hx     Past Surgical History:  Procedure Laterality Date   ARTHRODESIS METATARSAL Left 12/30/2018   Procedure: ARTHRODESIS,LISFRANC;MULTIPLE LEFT;  Surgeon: Samara Deist, DPM;  Location: [facility];  Service: Podiatry;  Laterality: Left;  general with local   BLADDER SUSPENSION N/A 07/04/2015   Procedure: TRANSVAGINAL TAPE (TVT) SLING                   ;  Surgeon: Emily Filbert, MD;  Location: [facility];  Service: Gynecology;  Laterality: N/A;   COLONOSCOPY     COLONOSCOPY WITH PROPOFOL N/A 09/08/2019   Procedure: COLONOSCOPY WITH PROPOFOL;  Surgeon: Virgel Manifold,  MD;  Location: [facility];  Service: Endoscopy;  Laterality: N/A;   CYSTOCELE REPAIR N/A 07/04/2015   Procedure: ANTERIOR REPAIR (CYSTOCELE);  Surgeon: Emily Filbert, MD;  Location: [facility];  Service: Gynecology;  Laterality: N/A;   CYSTOSCOPY N/A 07/04/2015   Procedure: CYSTOSCOPY;  Surgeon: Emily Filbert, MD;  Location: [facility];  Service: Gynecology;  Laterality: N/A;   DILATION AND CURETTAGE OF UTERUS     EXCISION MORTON'S NEUROMA Left 08/12/2018   Procedure: EXCISION MORTON'S NEUROMA;  Surgeon: Samara Deist, DPM;  Location: [facility];  Service: Podiatry;  Laterality: Left;  lma local   POLYPECTOMY     TOTAL KNEE ARTHROPLASTY Right 08/13/2021   Procedure: RIGHT TOTAL KNEE ARTHROPLASTY;  Surgeon: Leandrew Koyanagi, MD;  Location: [facility];  Service: Orthopedics;  Laterality: Right;   TUBAL LIGATION  1987   UPPER GI ENDOSCOPY     VAGINAL HYSTERECTOMY N/A 07/04/2015   Procedure: TOTAL VAGINAL HYSTERECTOMY  ;  Surgeon: Emily Filbert, MD;  Location: [facility];  Service: Gynecology;  Laterality: N/A;   Social History   Occupational History   Not on file  Tobacco Use   Smoking status: Never   Smokeless tobacco: Never  Vaping Use   Vaping Use: Never used  Substance and Sexual Activity   Alcohol use: No    Alcohol/week: 0.0 standard drinks of alcohol    Comment: rare   Drug use: No   Sexual activity: Not Currently    Birth control/protection: Post-menopausal, Surgical

## 2022-02-19 ENCOUNTER — Ambulatory Visit: Payer: Medicare PPO | Admitting: Urology

## 2022-02-19 ENCOUNTER — Encounter: Payer: Self-pay | Admitting: Urology

## 2022-02-19 VITALS — BP 125/75 | HR 74 | Ht 68.0 in | Wt 160.0 lb

## 2022-02-19 DIAGNOSIS — R300 Dysuria: Secondary | ICD-10-CM

## 2022-02-19 LAB — URINALYSIS, COMPLETE
Bilirubin, UA: NEGATIVE
Glucose, UA: NEGATIVE
Ketones, UA: NEGATIVE
Nitrite, UA: NEGATIVE
Specific Gravity, UA: 1.015 (ref 1.005–1.030)
Urobilinogen, Ur: 0.2 mg/dL (ref 0.2–1.0)
pH, UA: 5.5 (ref 5.0–7.5)

## 2022-02-19 LAB — MICROSCOPIC EXAMINATION: WBC, UA: >30 /hpf — AB (ref 0–5)

## 2022-02-19 MED ORDER — CEFUROXIME AXETIL 500 MG PO TABS: 500.0000 mg | ORAL_TABLET | Freq: Two times a day (BID) | ORAL | 0 refills | Status: DC

## 2022-02-19 NOTE — Progress Notes (Signed)
02/19/2022 2:20 PM   Shannon [name] 12-16-1950 [id number]  Referring provider: Abner Greenspan, MD [address] [place],  [place] [address]  Urological history: 1. rUTI's -contributing factors of age, incontinence, constipation and vaginal atrophy -documented urine cultures over the last year  09/26/2021 - MUF  09/14/2021 - No growth -managed with increased fluid intake, good perineal hygiene, avoiding tub baths and vaginal moisturizer   2. OAB/Urgency -managed with Myrbetriq 50 mg daily and pumpkin seed oil  -cysto 10/2018 NED   3. Incontinence -TVT Sling by gynecologist in 06/2015 during hysterectomy with anterior repair  -managed with pessary    Chief Complaint  Patient presents with   Dysuria     HPI: Shannon [name] is a 72 y.o. female who presents today for possible UTI.  She states that yesterday she had a sudden onset of frequency, urgency and dysuria.  She increased her fluids and it abated throughout the day, but then this morning her symptoms returned.  Patient denies any modifying or aggravating factors.  Patient denies any gross hematuria or suprapubic/flank pain.  Patient denies any fevers, chills, nausea or vomiting.    UA is yellow slightly cloudy, specific gravity 1.015, 3+ blood, pH 5.5, trace protein, leukocyte +1, greater than 30 WBCs, 11-30 RBCs, 0-10 epithelial cells and moderate bacteria.   PMH: Past Medical History:  Diagnosis Date   Arthritis    left foot   Blood transfusion without reported diagnosis    Chronic neck and back pain    received physical therapy   Dysrhythmia    Pt told she had "benign arrythmia" after wearing monitor   GERD (gastroesophageal reflux disease)    with esophagitis   Motion sickness    boats, planes   Numbness    Right outer thigh, constant, notices more with standing for a long period of time   Plantar fasciitis    left   PONV (postoperative nausea and vomiting)     Surgical  History: Past Surgical History:  Procedure Laterality Date   ARTHRODESIS METATARSAL Left 12/30/2018   Procedure: ARTHRODESIS,LISFRANC;MULTIPLE LEFT;  Surgeon: Samara Deist, DPM;  Location: [facility];  Service: Podiatry;  Laterality: Left;  general with local   BLADDER SUSPENSION N/A 07/04/2015   Procedure: TRANSVAGINAL TAPE (TVT) SLING                   ;  Surgeon: Emily Filbert, MD;  Location: [facility];  Service: Gynecology;  Laterality: N/A;   COLONOSCOPY     COLONOSCOPY WITH PROPOFOL N/A 09/08/2019   Procedure: COLONOSCOPY WITH PROPOFOL;  Surgeon: Virgel Manifold, MD;  Location: [facility];  Service: Endoscopy;  Laterality: N/A;   CYSTOCELE REPAIR N/A 07/04/2015   Procedure: ANTERIOR REPAIR (CYSTOCELE);  Surgeon: Emily Filbert, MD;  Location: [facility];  Service: Gynecology;  Laterality: N/A;   CYSTOSCOPY N/A 07/04/2015   Procedure: CYSTOSCOPY;  Surgeon: Emily Filbert, MD;  Location: [facility];  Service: Gynecology;  Laterality: N/A;   DILATION AND CURETTAGE OF UTERUS     EXCISION MORTON'S NEUROMA Left 08/12/2018   Procedure: EXCISION MORTON'S NEUROMA;  Surgeon: Samara Deist, DPM;  Location: [facility];  Service: Podiatry;  Laterality: Left;  lma local   POLYPECTOMY     TOTAL KNEE ARTHROPLASTY Right 08/13/2021   Procedure: RIGHT TOTAL KNEE ARTHROPLASTY;  Surgeon: Leandrew Koyanagi, MD;  Location: [facility];  Service: Orthopedics;  Laterality: Right;   [place]  UPPER GI ENDOSCOPY     VAGINAL HYSTERECTOMY N/A 07/04/2015   Procedure: TOTAL VAGINAL HYSTERECTOMY  ;  Surgeon: Emily Filbert, MD;  Location: [facility];  Service: Gynecology;  Laterality: N/A;    Home Medications:  Allergies as of 02/19/2022       Reactions   Adhesive [tape] Rash   (not sure if rash was from ointment or tape)   Alpha-gal Rash   (red meat S/P tick bite)   Neomycin-bacitracin Zn-polymyx Rash   Ointment   Nickel Rash        Medication List        Accurate as of February 19, 2022  2:20 PM.  If you have any questions, ask your nurse or doctor.          STOP taking these medications    amoxicillin 500 MG tablet Commonly known as: AMOXIL Stopped by: Grete Bosko, PA-C       TAKE these medications    cefUROXime 500 MG tablet Commonly known as: CEFTIN Take 1 tablet (500 mg total) by mouth 2 (two) times daily with a meal. Started by: Zara Council, PA-C   cholecalciferol 25 MCG (1000 UNIT) tablet Commonly known as: VITAMIN D3 Take 1,000 Units by mouth daily.   diclofenac Sodium 1 % Gel Commonly known as: VOLTAREN Apply 1 Application topically 4 (four) times daily as needed (pain).   docusate sodium 100 MG capsule Commonly known as: Colace Take 1 capsule (100 mg total) by mouth daily as needed.   famotidine 20 MG tablet Commonly known as: PEPCID Take 20 mg by mouth daily as needed for heartburn or indigestion.   Gemtesa 75 MG Tabs Generic drug: Vibegron Take 75 mg by mouth daily.   MAGNESIUM GLYCINATE PO Take 400 mg by mouth daily.   melatonin 5 MG Tabs Take 5 mg by mouth at bedtime.   multivitamin tablet Take 1 tablet by mouth daily.   PHAZYME GAS & ACID MAX ST PO Take 1 tablet by mouth as needed (bloating).   PUMPKIN SEED OIL PO Take 200 mg by mouth in the morning and at bedtime.   St Johns Wort 300 MG Tabs Take 600 mg by mouth in the morning and at bedtime.        Allergies:  Allergies  Allergen Reactions   Adhesive [Tape] Rash    (not sure if rash was from ointment or tape)   Alpha-Gal Rash    (red meat S/P tick bite)   Neomycin-Bacitracin Zn-Polymyx Rash    Ointment   Nickel Rash    Family History: Family History  Problem Relation Age of Onset   Stroke Mother        x 2   Heart disease Mother        congenital arrhythmia and CHF   Depression Mother    Atrial fibrillation Mother    Stomach cancer Paternal Uncle    Prostate cancer Brother    Atrial fibrillation Brother    Colon cancer Neg Hx    Esophageal cancer  Neg Hx    Kidney cancer Neg Hx    Bladder Cancer Neg Hx    Breast cancer Neg Hx     Social History:  reports that she has never smoked. She has never used smokeless tobacco. She reports that she does not drink alcohol and does not use drugs.  ROS: Pertinent ROS in HPI  Physical Exam: BP 125/75   Pulse 74   Ht '5\' 8"'$  (1.727 m)  Wt 160 lb (72.6 kg)   BMI 24.33 kg/m   Constitutional:  Well nourished. Alert and oriented, No acute distress. HEENT: [place] AT, moist mucus membranes.  Trachea midline Cardiovascular: No clubbing, cyanosis, or edema. Respiratory: Normal respiratory effort, no increased work of breathing. Neurologic: Grossly intact, no focal deficits, moving all 4 extremities. Psychiatric: Normal mood and affect.    Laboratory Data: Urinalysis See EPIC and HPI I have reviewed the labs.   Pertinent Imaging: N/A   Assessment & Plan:    1. Suspected UTI -UA grossly infected -Urine culture pending -Start Ceftin 500 mg twice daily  2.  Microscopic hematuria -Return in 2 months to recheck urinalysis to ensure microscopic hematuria has resolved  Return in about 2 months (around 04/20/2022) for Symptom recheck and recheck on UA to ensure micro heme resolves.  These notes generated with voice recognition software. I apologize for typographical errors.  [place], [place] [address]  [place], [place] [address] [phone]

## 2022-02-24 LAB — CULTURE, URINE COMPREHENSIVE

## 2022-02-25 ENCOUNTER — Encounter: Payer: Self-pay | Admitting: Family Medicine

## 2022-03-08 ENCOUNTER — Ambulatory Visit: Payer: Medicare PPO | Admitting: Physician Assistant

## 2022-03-08 VITALS — BP 116/68 | HR 67 | Ht 68.5 in | Wt 167.2 lb

## 2022-03-08 DIAGNOSIS — N390 Urinary tract infection, site not specified: Secondary | ICD-10-CM | POA: Diagnosis not present

## 2022-03-08 DIAGNOSIS — R3915 Urgency of urination: Secondary | ICD-10-CM | POA: Diagnosis not present

## 2022-03-08 LAB — URINALYSIS, COMPLETE
Bilirubin, UA: NEGATIVE
Glucose, UA: NEGATIVE
Ketones, UA: NEGATIVE
Nitrite, UA: POSITIVE — AB
Specific Gravity, UA: 1.025 (ref 1.005–1.030)
Urobilinogen, Ur: 0.2 mg/dL (ref 0.2–1.0)
pH, UA: 5 (ref 5.0–7.5)

## 2022-03-08 LAB — MICROSCOPIC EXAMINATION: WBC, UA: >30 /hpf — AB (ref 0–5)

## 2022-03-08 MED ORDER — SULFAMETHOXAZOLE-TRIMETHOPRIM 800-160 MG PO TABS: 1.0000 | ORAL_TABLET | Freq: Two times a day (BID) | ORAL | 0 refills | Status: AC

## 2022-03-08 MED ORDER — PREMARIN 0.625 MG/GM VA CREA: TOPICAL_CREAM | VAGINAL | 4 refills | Status: DC

## 2022-03-08 NOTE — Progress Notes (Unsigned)
03/08/2022 11:59 AM   [name] Bolen [birth year] [id number]  CC: Chief Complaint  Patient presents with   Urinary Frequency   HPI: Shannon [name] is a 72 y.o. female with PMH recurrent UTI, OAB on Myrbetriq 50 mg, and incontinence managed with pessary who presents today for evaluation of recurrent UTI.   She was seen in clinic most recently by Larene Beach 17 days ago for the same.  She was treated with Ceftin x 7 days and her urine culture finalized with pansensitive E. Coli.  Today she reports she actually received 10 days of Ceftin and completed the prescription.  Her symptoms completely resolved.  2 days ago, she had return of dysuria, frequency, and urgency.  She was previously on vaginal estrogen cream per GYN and stopped it about 1 year ago.  She has read the hormone is sourced from pregnant mare urine and is concerned about animal cruelty associated with this.  In-office UA today positive for 1+ blood, 1+ protein, nitrites, and 1+ leukocytes; urine microscopy with >30 WBCs/HPF, 11-30 RBCs/HPF, and moderate bacteria.  PMH: Past Medical History:  Diagnosis Date   Arthritis    left foot   Blood transfusion without reported diagnosis    Chronic neck and back pain    received physical therapy   Dysrhythmia    Pt told she had "benign arrythmia" after wearing monitor   GERD (gastroesophageal reflux disease)    with esophagitis   Motion sickness    boats, planes   Numbness    Right outer thigh, constant, notices more with standing for a long period of time   Plantar fasciitis    left   PONV (postoperative nausea and vomiting)     Surgical History: Past Surgical History:  Procedure Laterality Date   ARTHRODESIS METATARSAL Left 12/30/2018   Procedure: ARTHRODESIS,LISFRANC;MULTIPLE LEFT;  Surgeon: Samara Deist, DPM;  Location: [facility];  Service: Podiatry;  Laterality: Left;  general with local   BLADDER SUSPENSION N/A 07/04/2015   Procedure:  TRANSVAGINAL TAPE (TVT) SLING                   ;  Surgeon: Emily Filbert, MD;  Location: [facility];  Service: Gynecology;  Laterality: N/A;   COLONOSCOPY     COLONOSCOPY WITH PROPOFOL N/A 09/08/2019   Procedure: COLONOSCOPY WITH PROPOFOL;  Surgeon: Virgel Manifold, MD;  Location: [facility];  Service: Endoscopy;  Laterality: N/A;   CYSTOCELE REPAIR N/A 07/04/2015   Procedure: ANTERIOR REPAIR (CYSTOCELE);  Surgeon: Emily Filbert, MD;  Location: [facility];  Service: Gynecology;  Laterality: N/A;   CYSTOSCOPY N/A 07/04/2015   Procedure: CYSTOSCOPY;  Surgeon: Emily Filbert, MD;  Location: [facility];  Service: Gynecology;  Laterality: N/A;   DILATION AND CURETTAGE OF UTERUS     EXCISION MORTON'S NEUROMA Left 08/12/2018   Procedure: EXCISION MORTON'S NEUROMA;  Surgeon: Samara Deist, DPM;  Location: [facility];  Service: Podiatry;  Laterality: Left;  lma local   POLYPECTOMY     TOTAL KNEE ARTHROPLASTY Right 08/13/2021   Procedure: RIGHT TOTAL KNEE ARTHROPLASTY;  Surgeon: Leandrew Koyanagi, MD;  Location: [facility];  Service: Orthopedics;  Laterality: Right;   TUBAL LIGATION  1987   UPPER GI ENDOSCOPY     VAGINAL HYSTERECTOMY N/A 07/04/2015   Procedure: TOTAL VAGINAL HYSTERECTOMY  ;  Surgeon: Emily Filbert, MD;  Location: [facility];  Service: Gynecology;  Laterality: N/A;    Home Medications:  Allergies as of  03/08/2022       Reactions   Adhesive [tape] Rash   (not sure if rash was from ointment or tape)   Alpha-gal Rash   (red meat S/P tick bite)   Neomycin-bacitracin Zn-polymyx Rash   Ointment   Nickel Rash        Medication List        Accurate as of March 08, 2022 11:59 AM. If you have any questions, ask your nurse or doctor.          STOP taking these medications    cefUROXime 500 MG tablet Commonly known as: CEFTIN       TAKE these medications    cholecalciferol 25 MCG (1000 UNIT) tablet Commonly known as: VITAMIN D3 Take 1,000 Units by mouth daily.   diclofenac Sodium 1  % Gel Commonly known as: VOLTAREN Apply 1 Application topically 4 (four) times daily as needed (pain).   docusate sodium 100 MG capsule Commonly known as: Colace Take 1 capsule (100 mg total) by mouth daily as needed.   famotidine 20 MG tablet Commonly known as: PEPCID Take 20 mg by mouth daily as needed for heartburn or indigestion.   Gemtesa 75 MG Tabs Generic drug: Vibegron Take 75 mg by mouth daily.   MAGNESIUM GLYCINATE PO Take 400 mg by mouth daily.   melatonin 5 MG Tabs Take 5 mg by mouth at bedtime.   multivitamin tablet Take 1 tablet by mouth daily.   PHAZYME GAS & ACID MAX ST PO Take 1 tablet by mouth as needed (bloating).   PUMPKIN SEED OIL PO Take 200 mg by mouth in the morning and at bedtime.   St Johns Wort 300 MG Tabs Take 600 mg by mouth in the morning and at bedtime.        Allergies:  Allergies  Allergen Reactions   Adhesive [Tape] Rash    (not sure if rash was from ointment or tape)   Alpha-Gal Rash    (red meat S/P tick bite)   Neomycin-Bacitracin Zn-Polymyx Rash    Ointment   Nickel Rash    Family History: Family History  Problem Relation Age of Onset   Stroke Mother        x 2   Heart disease Mother        congenital arrhythmia and CHF   Depression Mother    Atrial fibrillation Mother    Stomach cancer Paternal Uncle    Prostate cancer Brother    Atrial fibrillation Brother    Colon cancer Neg Hx    Esophageal cancer Neg Hx    Kidney cancer Neg Hx    Bladder Cancer Neg Hx    Breast cancer Neg Hx     Social History:   reports that she has never smoked. She has never used smokeless tobacco. She reports that she does not drink alcohol and does not use drugs.  Physical Exam: BP 116/68   Pulse 67   Ht 5' 8.5" (1.74 m)   Wt 167 lb 4 oz (75.9 kg)   BMI 25.06 kg/m   Constitutional:  Alert and oriented, no acute distress, nontoxic appearing HEENT: [place], AT Cardiovascular: No clubbing, cyanosis, or edema Respiratory: Normal  respiratory effort, no increased work of breathing Skin: No rashes, bruises or suspicious lesions Neurologic: Grossly intact, no focal deficits, moving all 4 extremities Psychiatric: Normal mood and affect  Laboratory Data: Results for orders placed or performed in visit on 03/08/22  CULTURE, URINE COMPREHENSIVE   Specimen:  Urine   UR  Result Value Ref Range   Urine Culture, Comprehensive Preliminary report (A)    Organism ID, Bacteria Escherichia coli (A)    Organism ID, Bacteria Comment   Microscopic Examination   Urine  Result Value Ref Range   WBC, UA >30 (A) 0 - 5 /hpf   RBC, Urine 11-30 (A) 0 - 2 /hpf   Epithelial Cells (non renal) 0-10 0 - 10 /hpf   Bacteria, UA Moderate (A) None seen/Few  Urinalysis, Complete  Result Value Ref Range   Specific Gravity, UA 1.025 1.005 - 1.030   pH, UA 5.0 5.0 - 7.5   Color, UA Orange Yellow   Appearance Ur Hazy (A) Clear   Leukocytes,UA 1+ (A) Negative   Protein,UA 1+ (A) Negative/Trace   Glucose, UA Negative Negative   Ketones, UA Negative Negative   RBC, UA 1+ (A) Negative   Bilirubin, UA Negative Negative   Urobilinogen, Ur 0.2 0.2 - 1.0 mg/dL   Nitrite, UA Positive (A) Negative   Microscopic Examination See below:    Assessment & Plan:   1. Recurrent UTI UA again grossly infected, consistent with recurrent versus persistent UTI.  Will start empiric Bactrim and send for culture for further evaluation.  Recommend resuming topical vaginal estrogen cream.  I will also do some research on alternative sources of hormone and reach out to her via MyChart with my findings.  We also discussed repeating cystoscopy given frequency of recurrent infections and she declined this. - Urinalysis, Complete - CULTURE, URINE COMPREHENSIVE - sulfamethoxazole-trimethoprim (BACTRIM DS) 800-160 MG tablet; Take 1 tablet by mouth 2 (two) times daily for 7 days.  Dispense: 14 tablet; Refill: 0 - conjugated estrogens (PREMARIN) vaginal cream; Apply one  pea-sized amount around the opening of the urethra daily for 2 weeks, then 3 times weekly moving forward.  Dispense: 30 g; Refill: 4   Return if symptoms worsen or fail to improve.  Debroah Loop, PA-C  [facility] [address], [place], [place] [address] [phone]

## 2022-03-13 LAB — CULTURE, URINE COMPREHENSIVE

## 2022-03-14 ENCOUNTER — Ambulatory Visit (INDEPENDENT_AMBULATORY_CARE_PROVIDER_SITE_OTHER): Payer: Medicare PPO

## 2022-03-14 ENCOUNTER — Ambulatory Visit: Payer: Medicare PPO | Admitting: Orthopaedic Surgery

## 2022-03-14 DIAGNOSIS — Z96651 Presence of right artificial knee joint: Secondary | ICD-10-CM

## 2022-03-14 NOTE — Progress Notes (Signed)
Office Visit Note   Patient: Shannon [name]           Date of Birth: January 19, 1951           MRN: [id number] Visit Date: 03/14/2022              Requested by: Tower, Wynelle Fanny, MD [place],  [place] [address] PCP: Abner Greenspan, MD   Assessment & Plan: Visit Diagnoses:  1. Status post total right knee replacement     Plan: In terms of the acute pain I believe that she may have had some scar tissue impingement that I explained is not uncommon to experience after her knee replacement.  I would expect this to fully resolve.  Recommend symptomatic treatment with ice, heat, relative rest and activity modification.  In regards to the posterior lateral knee pain this is consistent with biceps femoris tendinitis.  Treatments explained as well.  Will see her back as scheduled for 1 year visit with two-view x-rays of the right knee.  Follow-Up Instructions: No follow-ups on file.   Orders:  Orders Placed This Encounter  Procedures   XR KNEE 3 VIEW RIGHT   No orders of the defined types were placed in this encounter.     Procedures: No procedures performed   Clinical Data: No additional findings.   Subjective: Chief Complaint  Patient presents with   Right Knee - Pain    HPI  Patient is a 72 year old female who is 7 months status post right total knee replacement.  She comes in for evaluation of acute right knee pain that occurred 2 days ago when she was getting out of the shower and felt a very sharp pain around the anterior medial side of the knee.  She denies any swelling or bruising.  The pain has improved.  She is also describing posterior lateral knee pain that is chronic.  Review of Systems   Objective: Vital Signs: There were no vitals taken for this visit.  Physical Exam  Ortho Exam  Examination of right knee shows fully healed surgical scar without any signs of infection.  She has excellent range of motion without pain.  She is fairly  tender to the anterior medial joint line.  I do not feel an effusion.  Good varus valgus stability.  She reports discomfort along the biceps femoris tendon but I cannot reproduce the pain.  Specialty Comments:  No specialty comments available.  Imaging: XR KNEE 3 VIEW RIGHT  Result Date: 03/14/2022 Stable right total knee replacement without complication.    PMFS History: Patient Active Problem List   Diagnosis Date Noted   Status post total right knee replacement 08/13/2021   Pre-operative cardiovascular examination 06/10/2021   Pre-op examination 05/31/2021   Primary osteoarthritis of right knee 03/28/2021   Polyp of colon    History of PSVT (paroxysmal supraventricular tachycardia) 06/17/2017   Recurrent UTI 08/23/2016   Estrogen deficiency 12/06/2015   Elevated blood sugar 12/06/2015   Incontinence of urine in female 04/12/2015   Medicare annual wellness visit, subsequent 11/30/2014   Routine general medical examination at a health care facility 11/05/2012   PLANTAR FASCIITIS, LEFT 07/28/2009   PERSONAL HX COLONIC POLYPS 11/02/2007   GERD 10/20/2006   Past Medical History:  Diagnosis Date   Arthritis    left foot   Blood transfusion without reported diagnosis    Chronic neck and back pain    received physical therapy   Dysrhythmia  Pt told she had "benign arrythmia" after wearing monitor   GERD (gastroesophageal reflux disease)    with esophagitis   Motion sickness    boats, planes   Numbness    Right outer thigh, constant, notices more with standing for a long period of time   Plantar fasciitis    left   PONV (postoperative nausea and vomiting)     Family History  Problem Relation Age of Onset   Stroke Mother        x 2   Heart disease Mother        congenital arrhythmia and CHF   Depression Mother    Atrial fibrillation Mother    Stomach cancer Paternal Uncle    Prostate cancer Brother    Atrial fibrillation Brother    Colon cancer Neg Hx     Esophageal cancer Neg Hx    Kidney cancer Neg Hx    Bladder Cancer Neg Hx    Breast cancer Neg Hx     Past Surgical History:  Procedure Laterality Date   ARTHRODESIS METATARSAL Left 12/30/2018   Procedure: ARTHRODESIS,LISFRANC;MULTIPLE LEFT;  Surgeon: Samara Deist, DPM;  Location: [facility];  Service: Podiatry;  Laterality: Left;  general with local   BLADDER SUSPENSION N/A 07/04/2015   Procedure: TRANSVAGINAL TAPE (TVT) SLING                   ;  Surgeon: Emily Filbert, MD;  Location: [facility];  Service: Gynecology;  Laterality: N/A;   COLONOSCOPY     COLONOSCOPY WITH PROPOFOL N/A 09/08/2019   Procedure: COLONOSCOPY WITH PROPOFOL;  Surgeon: Virgel Manifold, MD;  Location: [facility];  Service: Endoscopy;  Laterality: N/A;   CYSTOCELE REPAIR N/A 07/04/2015   Procedure: ANTERIOR REPAIR (CYSTOCELE);  Surgeon: Emily Filbert, MD;  Location: [facility];  Service: Gynecology;  Laterality: N/A;   CYSTOSCOPY N/A 07/04/2015   Procedure: CYSTOSCOPY;  Surgeon: Emily Filbert, MD;  Location: [facility];  Service: Gynecology;  Laterality: N/A;   DILATION AND CURETTAGE OF UTERUS     EXCISION MORTON'S NEUROMA Left 08/12/2018   Procedure: EXCISION MORTON'S NEUROMA;  Surgeon: Samara Deist, DPM;  Location: [facility];  Service: Podiatry;  Laterality: Left;  lma local   POLYPECTOMY     TOTAL KNEE ARTHROPLASTY Right 08/13/2021   Procedure: RIGHT TOTAL KNEE ARTHROPLASTY;  Surgeon: Leandrew Koyanagi, MD;  Location: [facility];  Service: Orthopedics;  Laterality: Right;   TUBAL LIGATION  1987   UPPER GI ENDOSCOPY     VAGINAL HYSTERECTOMY N/A 07/04/2015   Procedure: TOTAL VAGINAL HYSTERECTOMY  ;  Surgeon: Emily Filbert, MD;  Location: [facility];  Service: Gynecology;  Laterality: N/A;   Social History   Occupational History   Not on file  Tobacco Use   Smoking status: Never   Smokeless tobacco: Never  Vaping Use   Vaping Use: Never used  Substance and Sexual Activity   Alcohol use: No    Alcohol/week: 0.0  standard drinks of alcohol    Comment: rare   Drug use: No   Sexual activity: Not Currently    Birth control/protection: Post-menopausal, Surgical

## 2022-03-15 ENCOUNTER — Other Ambulatory Visit: Payer: Self-pay | Admitting: Physician Assistant

## 2022-03-15 DIAGNOSIS — N390 Urinary tract infection, site not specified: Secondary | ICD-10-CM

## 2022-03-15 MED ORDER — ESTRADIOL 0.1 MG/GM VA CREA: TOPICAL_CREAM | VAGINAL | 12 refills | Status: DC

## 2022-04-09 ENCOUNTER — Other Ambulatory Visit: Payer: Self-pay | Admitting: Family Medicine

## 2022-04-09 DIAGNOSIS — Z1231 Encounter for screening mammogram for malignant neoplasm of breast: Secondary | ICD-10-CM

## 2022-04-17 NOTE — Progress Notes (Signed)
04/18/2022 2:54 PM   Shannon [name] 19-Nov-1950 [id number]  Referring provider: Abner Greenspan, MD [address] [place],  [place] [address]  Urological history: 1. rUTI's -contributing factors of age, incontinence, constipation and vaginal atrophy -documented urine cultures over the last year  03/08/2022 -E. Coli  02/19/2022 - E. Coli   09/26/2021 - MUF  09/14/2021 - No growth -managed with increased fluid intake, good perineal hygiene, avoiding tub baths and vaginal moisturizer   2. OAB/Urgency -cysto (2020) NED -Myrbetriq 50 mg daily and pumpkin seed oil   3. Incontinence -TVT Sling by gynecologist in 06/2015 during hysterectomy with anterior repair  -managed with pessary    Chief Complaint  Patient presents with   Follow-up     HPI: Shannon [name] is a 72 y.o. female who presents today for two month follow up.   I had seen her on February 19, 2022 and diagnosed her with an E. coli UTI and then she returned on March 08, 2022 with another E.coli UTI.  She was hesitant to start on vaginal estrogen cream at that visit due to her concerns for animal cruelty.  And also she was hesitant to repeat cystoscopy.  She feels that she is back to baseline.  She is taking the [place] and she is also using the vaginal estrogen cream.  Patient denies any modifying or aggravating factors.  Patient denies any gross hematuria, dysuria or suprapubic/flank pain.  Patient denies any fevers, chills, nausea or vomiting.    UA yellow slightly cloudy, specific gravity 1.020, pH 7.0, 6-10 WBCs, 0-2 RBCs, 0-10 epithelial cells, hyaline casts are present, amorphous sediment present, mucus threads are present and many bacteria.  PMH: Past Medical History:  Diagnosis Date   Arthritis    left foot   Blood transfusion without reported diagnosis    Chronic neck and back pain    received physical therapy   Dysrhythmia    Pt told she had "benign arrythmia" after wearing  monitor   GERD (gastroesophageal reflux disease)    with esophagitis   Motion sickness    boats, planes   Numbness    Right outer thigh, constant, notices more with standing for a long period of time   Plantar fasciitis    left   PONV (postoperative nausea and vomiting)     Surgical History: Past Surgical History:  Procedure Laterality Date   ARTHRODESIS METATARSAL Left 12/30/2018   Procedure: ARTHRODESIS,LISFRANC;MULTIPLE LEFT;  Surgeon: Samara Deist, DPM;  Location: [facility];  Service: Podiatry;  Laterality: Left;  general with local   BLADDER SUSPENSION N/A 07/04/2015   Procedure: TRANSVAGINAL TAPE (TVT) SLING                   ;  Surgeon: Emily Filbert, MD;  Location: [facility];  Service: Gynecology;  Laterality: N/A;   COLONOSCOPY     COLONOSCOPY WITH PROPOFOL N/A 09/08/2019   Procedure: COLONOSCOPY WITH PROPOFOL;  Surgeon: Virgel Manifold, MD;  Location: [facility];  Service: Endoscopy;  Laterality: N/A;   CYSTOCELE REPAIR N/A 07/04/2015   Procedure: ANTERIOR REPAIR (CYSTOCELE);  Surgeon: Emily Filbert, MD;  Location: [facility];  Service: Gynecology;  Laterality: N/A;   CYSTOSCOPY N/A 07/04/2015   Procedure: CYSTOSCOPY;  Surgeon: Emily Filbert, MD;  Location: [facility];  Service: Gynecology;  Laterality: N/A;   DILATION AND CURETTAGE OF UTERUS     EXCISION MORTON'S NEUROMA Left 08/12/2018   Procedure: EXCISION MORTON'S  NEUROMA;  Surgeon: Samara Deist, DPM;  Location: [facility];  Service: Podiatry;  Laterality: Left;  lma local   POLYPECTOMY     TOTAL KNEE ARTHROPLASTY Right 08/13/2021   Procedure: RIGHT TOTAL KNEE ARTHROPLASTY;  Surgeon: Leandrew Koyanagi, MD;  Location: [facility];  Service: Orthopedics;  Laterality: Right;   TUBAL LIGATION  1987   UPPER GI ENDOSCOPY     VAGINAL HYSTERECTOMY N/A 07/04/2015   Procedure: TOTAL VAGINAL HYSTERECTOMY  ;  Surgeon: Emily Filbert, MD;  Location: [facility];  Service: Gynecology;  Laterality: N/A;    Home Medications:  Allergies as  of 04/18/2022       Reactions   Adhesive [tape] Rash   (not sure if rash was from ointment or tape)   Alpha-gal Rash   (red meat S/P tick bite)   Neomycin-bacitracin Zn-polymyx Rash   Ointment   Nickel Rash        Medication List        Accurate as of April 18, 2022  2:54 PM. If you have any questions, ask your nurse or doctor.          cholecalciferol 25 MCG (1000 UNIT) tablet Commonly known as: VITAMIN D3 Take 1,000 Units by mouth daily.   diclofenac Sodium 1 % Gel Commonly known as: VOLTAREN Apply 1 Application topically 4 (four) times daily as needed (pain).   docusate sodium 100 MG capsule Commonly known as: Colace Take 1 capsule (100 mg total) by mouth daily as needed.   estradiol 0.1 MG/GM vaginal cream Commonly known as: ESTRACE Apply one pea-sized amount around the opening of the urethra daily for 2 weeks, then 3 times weekly moving forward.   famotidine 20 MG tablet Commonly known as: PEPCID Take 20 mg by mouth daily as needed for heartburn or indigestion.   Gemtesa 75 MG Tabs Generic drug: Vibegron Take 75 mg by mouth daily.   MAGNESIUM GLYCINATE PO Take 400 mg by mouth daily.   melatonin 5 MG Tabs Take 5 mg by mouth at bedtime.   multivitamin tablet Take 1 tablet by mouth daily.   PHAZYME GAS & ACID MAX ST PO Take 1 tablet by mouth as needed (bloating).   PUMPKIN SEED OIL PO Take 200 mg by mouth in the morning and at bedtime.   St Johns Wort 300 MG Tabs Take 600 mg by mouth in the morning and at bedtime.        Allergies:  Allergies  Allergen Reactions   Adhesive [Tape] Rash    (not sure if rash was from ointment or tape)   Alpha-Gal Rash    (red meat S/P tick bite)   Neomycin-Bacitracin Zn-Polymyx Rash    Ointment   Nickel Rash    Family History: Family History  Problem Relation Age of Onset   Stroke Mother        x 2   Heart disease Mother        congenital arrhythmia and CHF   Depression Mother    Atrial  fibrillation Mother    Stomach cancer Paternal Uncle    Prostate cancer Brother    Atrial fibrillation Brother    Colon cancer Neg Hx    Esophageal cancer Neg Hx    Kidney cancer Neg Hx    Bladder Cancer Neg Hx    Breast cancer Neg Hx     Social History:  reports that she has never smoked. She has never used smokeless tobacco. She reports that she  does not drink alcohol and does not use drugs.  ROS: Pertinent ROS in HPI  Physical Exam: BP 112/67   Pulse 71   Ht 5\' 8"  (1.727 m)   Wt 167 lb (75.8 kg)   BMI 25.39 kg/m   Constitutional:  Well nourished. Alert and oriented, No acute distress. HEENT: [place] AT, moist mucus membranes.  Trachea midline Cardiovascular: No clubbing, cyanosis, or edema. Respiratory: Normal respiratory effort, no increased work of breathing. Neurologic: Grossly intact, no focal deficits, moving all 4 extremities. Psychiatric: Normal mood and affect.    Laboratory Data: Urinalysis See EPIC and HPI I have reviewed the labs.   Pertinent Imaging: N/A   Assessment & Plan:    1.  Microscopic hematuria -resolved  2. Vaginal atrophy -continue to apply the cream three nights weekly  3. rUTI's -Continue the vaginal estrogen cream  Return in about 1 year (around 04/18/2023) for PVR and OAB questionnaire.  These notes generated with voice recognition software. I apologize for typographical errors.  Burns, Hurricane [address]  [place], [place] [address] [phone]

## 2022-04-18 ENCOUNTER — Ambulatory Visit: Payer: Medicare PPO | Admitting: Urology

## 2022-04-18 ENCOUNTER — Encounter: Payer: Self-pay | Admitting: Urology

## 2022-04-18 VITALS — BP 112/67 | HR 71 | Ht 68.0 in | Wt 167.0 lb

## 2022-04-18 DIAGNOSIS — R3129 Other microscopic hematuria: Secondary | ICD-10-CM | POA: Diagnosis not present

## 2022-04-18 DIAGNOSIS — Z87440 Personal history of urinary (tract) infections: Secondary | ICD-10-CM

## 2022-04-18 DIAGNOSIS — N952 Postmenopausal atrophic vaginitis: Secondary | ICD-10-CM | POA: Diagnosis not present

## 2022-04-18 DIAGNOSIS — N390 Urinary tract infection, site not specified: Secondary | ICD-10-CM

## 2022-04-19 LAB — URINALYSIS, COMPLETE
Bilirubin, UA: NEGATIVE
Glucose, UA: NEGATIVE
Ketones, UA: NEGATIVE
Leukocytes,UA: NEGATIVE
Nitrite, UA: NEGATIVE
Protein,UA: NEGATIVE
RBC, UA: NEGATIVE
Specific Gravity, UA: 1.02 (ref 1.005–1.030)
Urobilinogen, Ur: 0.2 mg/dL (ref 0.2–1.0)
pH, UA: 7 (ref 5.0–7.5)

## 2022-04-19 LAB — MICROSCOPIC EXAMINATION

## 2022-05-14 ENCOUNTER — Telehealth: Payer: Self-pay | Admitting: Family Medicine

## 2022-05-14 NOTE — Telephone Encounter (Signed)
Contacted Mackinsey Ward Greenbaum to schedule their annual wellness visit. Appointment made for 06/24/2022.  [facility] Care Guide [facility] TEAM Direct Dial: [phone]

## 2022-05-22 ENCOUNTER — Ambulatory Visit: Payer: Medicare PPO | Admitting: Urology

## 2022-05-24 ENCOUNTER — Ambulatory Visit: Payer: Medicare PPO

## 2022-05-31 ENCOUNTER — Ambulatory Visit
Admission: RE | Admit: 2022-05-31 | Discharge: 2022-05-31 | Disposition: A | Discharge status: Home / Self Care | Payer: Medicare PPO | Source: Ambulatory Visit | Attending: Family Medicine | Admitting: Family Medicine

## 2022-05-31 DIAGNOSIS — Z1231 Encounter for screening mammogram for malignant neoplasm of breast: Secondary | ICD-10-CM

## 2022-06-11 ENCOUNTER — Other Ambulatory Visit: Payer: Medicare PPO

## 2022-06-13 ENCOUNTER — Telehealth: Payer: Self-pay | Admitting: Family Medicine

## 2022-06-13 DIAGNOSIS — Z13220 Encounter for screening for lipoid disorders: Secondary | ICD-10-CM

## 2022-06-13 DIAGNOSIS — Z0000 Encounter for general adult medical examination without abnormal findings: Secondary | ICD-10-CM

## 2022-06-13 DIAGNOSIS — R739 Hyperglycemia, unspecified: Secondary | ICD-10-CM

## 2022-06-13 DIAGNOSIS — Z8679 Personal history of other diseases of the circulatory system: Secondary | ICD-10-CM

## 2022-06-13 NOTE — Telephone Encounter (Signed)
-----   Message from Alvina Chou sent at 06/03/2022 12:46 PM EDT ----- Regarding: Lab orders for Friday, 5.24.24 Patient is scheduled for CPX labs, please order future labs, Thanks , Camelia Eng

## 2022-06-14 ENCOUNTER — Other Ambulatory Visit (INDEPENDENT_AMBULATORY_CARE_PROVIDER_SITE_OTHER): Payer: Medicare PPO

## 2022-06-14 DIAGNOSIS — Z13220 Encounter for screening for lipoid disorders: Secondary | ICD-10-CM

## 2022-06-14 DIAGNOSIS — Z8679 Personal history of other diseases of the circulatory system: Secondary | ICD-10-CM | POA: Diagnosis not present

## 2022-06-14 DIAGNOSIS — R739 Hyperglycemia, unspecified: Secondary | ICD-10-CM | POA: Diagnosis not present

## 2022-06-14 LAB — LIPID PANEL
Cholesterol: 163 mg/dL (ref 0–200)
HDL: 76.5 mg/dL (ref >39.00)
LDL Cholesterol: 77 mg/dL (ref 0–99)
NonHDL: 86.16
Total CHOL/HDL Ratio: 2
Triglycerides: 47 mg/dL (ref 0.0–149.0)
VLDL: 9.4 mg/dL (ref 0.0–40.0)

## 2022-06-14 LAB — CBC WITH DIFFERENTIAL/PLATELET
Basophils Absolute: 0 10*3/uL (ref 0.0–0.1)
Basophils Relative: 1 % (ref 0.0–3.0)
Eosinophils Absolute: 0.1 10*3/uL (ref 0.0–0.7)
Eosinophils Relative: 1.7 % (ref 0.0–5.0)
HCT: 41.4 % (ref 36.0–46.0)
Hemoglobin: 13.4 g/dL (ref 12.0–15.0)
Lymphocytes Relative: 25.6 % (ref 12.0–46.0)
Lymphs Abs: 1 10*3/uL (ref 0.7–4.0)
MCHC: 32.3 g/dL (ref 30.0–36.0)
MCV: 90.8 fl (ref 78.0–100.0)
Monocytes Absolute: 0.4 10*3/uL (ref 0.1–1.0)
Monocytes Relative: 11.1 % (ref 3.0–12.0)
Neutro Abs: 2.4 10*3/uL (ref 1.4–7.7)
Neutrophils Relative %: 60.6 % (ref 43.0–77.0)
Platelets: 238 10*3/uL (ref 150.0–400.0)
RBC: 4.56 Mil/uL (ref 3.87–5.11)
RDW: 13.9 % (ref 11.5–15.5)
WBC: 4 10*3/uL (ref 4.0–10.5)

## 2022-06-14 LAB — COMPREHENSIVE METABOLIC PANEL
ALT: 13 U/L (ref 0–35)
AST: 19 U/L (ref 0–37)
Albumin: 3.9 g/dL (ref 3.5–5.2)
Alkaline Phosphatase: 79 U/L (ref 39–117)
BUN: 22 mg/dL (ref 6–23)
CO2: 32 mEq/L (ref 19–32)
Calcium: 9.5 mg/dL (ref 8.4–10.5)
Chloride: 103 mEq/L (ref 96–112)
Creatinine, Ser: 0.76 mg/dL (ref 0.40–1.20)
GFR: 77.48 mL/min (ref >60.00)
Glucose, Bld: 110 mg/dL — ABNORMAL HIGH (ref 70–99)
Potassium: 4.7 mEq/L (ref 3.5–5.1)
Sodium: 141 mEq/L (ref 135–145)
Total Bilirubin: 0.7 mg/dL (ref 0.2–1.2)
Total Protein: 6.4 g/dL (ref 6.0–8.3)

## 2022-06-14 LAB — HEMOGLOBIN A1C: Hgb A1c MFr Bld: 6.3 % (ref 4.6–6.5)

## 2022-06-14 LAB — TSH: TSH: 0.88 u[IU]/mL (ref 0.35–5.50)

## 2022-06-18 ENCOUNTER — Encounter: Payer: Medicare PPO | Admitting: Family Medicine

## 2022-06-20 ENCOUNTER — Ambulatory Visit: Payer: Medicare PPO

## 2022-06-24 ENCOUNTER — Encounter: Payer: Self-pay | Admitting: Family Medicine

## 2022-06-24 ENCOUNTER — Telehealth: Payer: Self-pay

## 2022-06-24 ENCOUNTER — Ambulatory Visit (INDEPENDENT_AMBULATORY_CARE_PROVIDER_SITE_OTHER): Payer: Medicare PPO | Admitting: Family Medicine

## 2022-06-24 ENCOUNTER — Ambulatory Visit (INDEPENDENT_AMBULATORY_CARE_PROVIDER_SITE_OTHER): Payer: Medicare PPO

## 2022-06-24 ENCOUNTER — Other Ambulatory Visit: Payer: Self-pay

## 2022-06-24 VITALS — Ht 68.5 in | Wt 165.0 lb

## 2022-06-24 VITALS — BP 110/62 | HR 75 | Temp 97.8°F | Ht 67.75 in | Wt 166.0 lb

## 2022-06-24 DIAGNOSIS — Z86010 Personal history of colonic polyps: Secondary | ICD-10-CM

## 2022-06-24 DIAGNOSIS — R7303 Prediabetes: Secondary | ICD-10-CM

## 2022-06-24 DIAGNOSIS — K219 Gastro-esophageal reflux disease without esophagitis: Secondary | ICD-10-CM

## 2022-06-24 DIAGNOSIS — Z780 Asymptomatic menopausal state: Secondary | ICD-10-CM | POA: Diagnosis not present

## 2022-06-24 DIAGNOSIS — Z13220 Encounter for screening for lipoid disorders: Secondary | ICD-10-CM

## 2022-06-24 DIAGNOSIS — Z0000 Encounter for general adult medical examination without abnormal findings: Secondary | ICD-10-CM

## 2022-06-24 DIAGNOSIS — Z1211 Encounter for screening for malignant neoplasm of colon: Secondary | ICD-10-CM

## 2022-06-24 DIAGNOSIS — R32 Unspecified urinary incontinence: Secondary | ICD-10-CM

## 2022-06-24 MED ORDER — NA SULFATE-K SULFATE-MG SULF 17.5-3.13-1.6 GM/177ML PO SOLN: 1.0000 | Freq: Once | ORAL | 0 refills | Status: AC

## 2022-06-24 NOTE — Assessment & Plan Note (Signed)
3 y colonoscopy scheduled for 09/2022

## 2022-06-24 NOTE — Assessment & Plan Note (Signed)
Continues use of pessary with estrace vag cream Also gemtesa Sees gyn and urology

## 2022-06-24 NOTE — Telephone Encounter (Signed)
Gastroenterology Pre-Procedure Review  Request Date: 10/18/22 Requesting Physician: Dr. Allegra Lai  PATIENT REVIEW QUESTIONS: The patient responded to the following health history questions as indicated:    1. Are you having any GI issues? no 2. Do you have a personal history of Polyps? yes (last colonoscopy was with Dr. Maximino Greenland on 09/08/19 polyps were noted) 3. Do you have a family history of Colon Cancer or Polyps? no 4. Diabetes Mellitus? no 5. Joint replacements in the past 12 months?right knee replacement 07/2021 6. Major health problems in the past 3 months?no 7. Any artificial heart valves, MVP, or defibrillator?no    MEDICATIONS & ALLERGIES:    Patient reports the following regarding taking any anticoagulation/antiplatelet therapy:   Plavix, Coumadin, Eliquis, Xarelto, Lovenox, Pradaxa, Brilinta, or Effient? no Aspirin? no  Patient confirms/reports the following medications:  Current Outpatient Medications  Medication Sig Dispense Refill   Calcium Carbonate-Simethicone (PHAZYME GAS & ACID MAX ST PO) Take 1 tablet by mouth as needed (bloating).     cholecalciferol (VITAMIN D3) 25 MCG (1000 UNIT) tablet Take 1,000 Units by mouth daily.     diclofenac Sodium (VOLTAREN) 1 % GEL Apply 1 Application topically 4 (four) times daily as needed (pain).     docusate sodium (COLACE) 100 MG capsule Take 1 capsule (100 mg total) by mouth daily as needed. 30 capsule 2   estradiol (ESTRACE) 0.1 MG/GM vaginal cream Apply one pea-sized amount around the opening of the urethra daily for 2 weeks, then 3 times weekly moving forward. 42.5 g 12   famotidine (PEPCID) 20 MG tablet Take 20 mg by mouth daily as needed for heartburn or indigestion.     MAGNESIUM GLYCINATE PO Take 400 mg by mouth daily.     melatonin 5 MG TABS Take 5 mg by mouth at bedtime.     Misc Natural Products (PUMPKIN SEED OIL PO) Take 200 mg by mouth in the morning and at bedtime.     Multiple Vitamin (MULTIVITAMIN) tablet Take 1  tablet by mouth daily.     St Johns Wort 300 MG TABS Take 600 mg by mouth in the morning and at bedtime.     Vibegron (GEMTESA) 75 MG TABS Take 75 mg by mouth daily. 90 tablet 3   No current facility-administered medications for this visit.    Patient confirms/reports the following allergies:  Allergies  Allergen Reactions   Adhesive [Tape] Rash    (not sure if rash was from ointment or tape)   Alpha-Gal Rash    (red meat S/P tick bite)   Neomycin-Bacitracin Zn-Polymyx Rash    Ointment   Nickel Rash    No orders of the defined types were placed in this encounter.   AUTHORIZATION INFORMATION Primary Insurance: 1D#: Group #:  Secondary Insurance: 1D#: Group #:  SCHEDULE INFORMATION: Date: 10/18/22 Time: Location: [facility]

## 2022-06-24 NOTE — Progress Notes (Signed)
Subjective:    Patient ID: Shannon [name], female    DOB: 04/22/1950, 73 y.o.   MRN: [id number]  HPI Here for health maintenance exam and to review chronic medical problems    Wt Readings from Last 3 Encounters:  06/24/22 166 lb (75.3 kg)  06/24/22 165 lb (74.8 kg)  04/18/22 167 lb (75.8 kg)   25.43 kg/m  Vitals:   06/24/22 1423  BP: 110/62  Pulse: 75  Temp: 97.8 F (36.6 C)  SpO2: 98%   Doing well  Knee repl went well  Keeping busy   Husband has health problems  Limits their travels    Immunization History  Administered Date(s) Administered   Influenza Whole 01/21/2005, 11/10/2006   Influenza, High Dose Seasonal PF 10/25/2016, 09/30/2017, 09/10/2018   Influenza-Unspecified 10/31/2012, 11/21/2014, 10/05/2015   PFIZER(Purple Top)SARS-COV-2 Vaccination 02/27/2019, 03/24/2019, 10/21/2019   Pneumococcal Conjugate-13 11/30/2014   Pneumococcal Polysaccharide-23 11/17/2013, 09/10/2018   Td 10/15/2001, 02/09/2010   Zoster Recombinat (Shingrix) 05/03/2016   Zoster, Live 09/04/2010   Health Maintenance Due  Topic Date Due   DTaP/Tdap/Td (3 - Tdap) 02/10/2020   Colonoscopy  09/08/2022   Tetanus shot 2012 Wants to update at the pharmacy    Colonoscopy 08/2019 with 3 y recall  This is planned for 09/1722   Mammogram 05/2022 Self breast exam: no lumps   She sees gyn routinely  Has a pessary    Dexa 11/2015 -normal at the breast center  One was ordered -she will schedule  Falls- none  Fractures-none Supplements -vitamin D  Exercise -walking     Mood     06/24/2022    8:08 AM 06/07/2021    1:02 PM 06/06/2020    2:35 PM 04/28/2019    9:47 AM 12/24/2017    9:22 AM  Depression screen PHQ 2/9  Decreased Interest 0 0 0 0 0  Down, Depressed, Hopeless 0 0 0 0 0  PHQ - 2 Score 0 0 0 0 0  Altered sleeping   1 0 0  Tired, decreased energy   1 0 0  Change in appetite   0 0 0  Feeling bad or failure about yourself    0 0 0  Trouble concentrating   0 0 0   Moving slowly or fidgety/restless   0 0 0  Suicidal thoughts   0 0 0  PHQ-9 Score   2 0 0  Difficult doing work/chores    Not difficult at all Not difficult at all    Blood sugar Lab Results  Component Value Date   HGBA1C 6.3 06/14/2022  Stable  Eats well overall     Cholesterol screen Lab Results  Component Value Date   CHOL 163 06/14/2022   CHOL 169 06/01/2021   CHOL 176 06/06/2020   Lab Results  Component Value Date   HDL 76.50 06/14/2022   HDL 79.90 06/01/2021   HDL 73.40 06/06/2020   Lab Results  Component Value Date   LDLCALC 77 06/14/2022   LDLCALC 79 06/01/2021   LDLCALC 76 06/06/2020   Lab Results  Component Value Date   TRIG 47.0 06/14/2022   TRIG 47.0 06/01/2021   TRIG 132.0 06/06/2020   Lab Results  Component Value Date   CHOLHDL 2 06/14/2022   CHOLHDL 2 06/01/2021   CHOLHDL 2 06/06/2020   No results found for: "LDLDIRECT" Good genes  Good diet   Sees urology for freq uti and overactive bladder Taking gemtasa  Has pessary  Patient Active Problem List   Diagnosis Date Noted   Lipid screening 06/13/2022   Status post total right knee replacement 08/13/2021   Pre-operative cardiovascular examination 06/10/2021   Pre-op examination 05/31/2021   Primary osteoarthritis of right knee 03/28/2021   Polyp of colon    History of PSVT (paroxysmal supraventricular tachycardia) 06/17/2017   Recurrent UTI 08/23/2016   Estrogen deficiency 12/06/2015   Prediabetes 12/06/2015   Incontinence of urine in female 04/12/2015   Medicare annual wellness visit, subsequent 11/30/2014   Routine general medical examination at a health care facility 11/05/2012   PLANTAR FASCIITIS, LEFT 07/28/2009   PERSONAL HX COLONIC POLYPS 11/02/2007   GERD 10/20/2006   Past Medical History:  Diagnosis Date   Arthritis    left foot   Blood transfusion without reported diagnosis    Chronic neck and back pain    received physical therapy   Dysrhythmia    Pt told  she had "benign arrythmia" after wearing monitor   GERD (gastroesophageal reflux disease)    with esophagitis   Motion sickness    boats, planes   Numbness    Right outer thigh, constant, notices more with standing for a long period of time   Plantar fasciitis    left   PONV (postoperative nausea and vomiting)    Past Surgical History:  Procedure Laterality Date   ARTHRODESIS METATARSAL Left 12/30/2018   Procedure: ARTHRODESIS,LISFRANC;MULTIPLE LEFT;  Surgeon: Gwyneth Revels, DPM;  Location: [facility];  Service: Podiatry;  Laterality: Left;  general with local   BLADDER SUSPENSION N/A 07/04/2015   Procedure: TRANSVAGINAL TAPE (TVT) SLING                   ;  Surgeon: Allie Bossier, MD;  Location: [facility];  Service: Gynecology;  Laterality: N/A;   COLONOSCOPY     COLONOSCOPY WITH PROPOFOL N/A 09/08/2019   Procedure: COLONOSCOPY WITH PROPOFOL;  Surgeon: Pasty Spillers, MD;  Location: [facility];  Service: Endoscopy;  Laterality: N/A;   CYSTOCELE REPAIR N/A 07/04/2015   Procedure: ANTERIOR REPAIR (CYSTOCELE);  Surgeon: Allie Bossier, MD;  Location: [facility];  Service: Gynecology;  Laterality: N/A;   CYSTOSCOPY N/A 07/04/2015   Procedure: CYSTOSCOPY;  Surgeon: Allie Bossier, MD;  Location: [facility];  Service: Gynecology;  Laterality: N/A;   DILATION AND CURETTAGE OF UTERUS     EXCISION MORTON'S NEUROMA Left 08/12/2018   Procedure: EXCISION MORTON'S NEUROMA;  Surgeon: Gwyneth Revels, DPM;  Location: [facility];  Service: Podiatry;  Laterality: Left;  lma local   POLYPECTOMY     TOTAL KNEE ARTHROPLASTY Right 08/13/2021   Procedure: RIGHT TOTAL KNEE ARTHROPLASTY;  Surgeon: Tarry Kos, MD;  Location: [facility];  Service: Orthopedics;  Laterality: Right;   TUBAL LIGATION  1987   UPPER GI ENDOSCOPY     VAGINAL HYSTERECTOMY N/A 07/04/2015   Procedure: TOTAL VAGINAL HYSTERECTOMY  ;  Surgeon: Allie Bossier, MD;  Location: [facility];  Service: Gynecology;  Laterality: N/A;   Social History    Tobacco Use   Smoking status: Never   Smokeless tobacco: Never  Vaping Use   Vaping Use: Never used  Substance Use Topics   Alcohol use: No    Alcohol/week: 0.0 standard drinks of alcohol    Comment: rare   Drug use: No   Family History  Problem Relation Age of Onset   Stroke Mother        x 2  Heart disease Mother        congenital arrhythmia and CHF   Depression Mother    Atrial fibrillation Mother    Stomach cancer Paternal Uncle    Prostate cancer Brother    Atrial fibrillation Brother    Colon cancer Neg Hx    Esophageal cancer Neg Hx    Kidney cancer Neg Hx    Bladder Cancer Neg Hx    Breast cancer Neg Hx    Allergies  Allergen Reactions   Adhesive [Tape] Rash    (not sure if rash was from ointment or tape)   Alpha-Gal Rash    (red meat S/P tick bite)   Neomycin-Bacitracin Zn-Polymyx Rash    Ointment   Nickel Rash   Current Outpatient Medications on File Prior to Visit  Medication Sig Dispense Refill   Calcium Carbonate-Simethicone (PHAZYME GAS & ACID MAX ST PO) Take 1 tablet by mouth as needed (bloating).     cholecalciferol (VITAMIN D3) 25 MCG (1000 UNIT) tablet Take 1,000 Units by mouth daily.     diclofenac Sodium (VOLTAREN) 1 % GEL Apply 1 Application topically 4 (four) times daily as needed (pain).     docusate sodium (COLACE) 100 MG capsule Take 1 capsule (100 mg total) by mouth daily as needed. 30 capsule 2   estradiol (ESTRACE) 0.1 MG/GM vaginal cream Apply one pea-sized amount around the opening of the urethra daily for 2 weeks, then 3 times weekly moving forward. 42.5 g 12   famotidine (PEPCID) 20 MG tablet Take 20 mg by mouth daily as needed for heartburn or indigestion.     MAGNESIUM GLYCINATE PO Take 400 mg by mouth daily.     melatonin 5 MG TABS Take 5 mg by mouth at bedtime.     Misc Natural Products (PUMPKIN SEED OIL PO) Take 200 mg by mouth in the morning and at bedtime.     Multiple Vitamin (MULTIVITAMIN) tablet Take 1 tablet by mouth  daily.     St Johns Wort 300 MG TABS Take 600 mg by mouth in the morning and at bedtime.     Vibegron (GEMTESA) 75 MG TABS Take 75 mg by mouth daily. 90 tablet 3   No current facility-administered medications on file prior to visit.     Review of Systems  Constitutional:  Negative for activity change, appetite change, fatigue, fever and unexpected weight change.  HENT:  Negative for congestion, ear pain, rhinorrhea, sinus pressure and sore throat.   Eyes:  Negative for pain, redness and visual disturbance.  Respiratory:  Negative for cough, shortness of breath and wheezing.   Cardiovascular:  Negative for chest pain and palpitations.  Gastrointestinal:  Negative for abdominal pain, blood in stool, constipation and diarrhea.  Endocrine: Negative for polydipsia and polyuria.  Genitourinary:  Negative for dysuria, frequency and urgency.  Musculoskeletal:  Positive for arthralgias. Negative for back pain and myalgias.       L thumb - arthritis pain   Skin:  Negative for pallor and rash.  Allergic/Immunologic: Negative for environmental allergies.  Neurological:  Negative for dizziness, syncope and headaches.  Hematological:  Negative for adenopathy. Does not bruise/bleed easily.  Psychiatric/Behavioral:  Negative for decreased concentration and dysphoric mood. The patient is not nervous/anxious.        Objective:   Physical Exam Constitutional:      General: She is not in acute distress.    Appearance: Normal appearance. She is well-developed and normal weight. She is not ill-appearing or  diaphoretic.  HENT:     Head: Normocephalic and atraumatic.     Right Ear: Tympanic membrane, ear canal and external ear normal.     Left Ear: Tympanic membrane, ear canal and external ear normal.     Nose: Nose normal. No congestion.     Mouth/Throat:     Mouth: Mucous membranes are moist.     Pharynx: Oropharynx is clear. No posterior oropharyngeal erythema.  Eyes:     General: No scleral  icterus.    Extraocular Movements: Extraocular movements intact.     Conjunctiva/sclera: Conjunctivae normal.     Pupils: Pupils are equal, round, and reactive to light.  Neck:     Thyroid: No thyromegaly.     Vascular: No carotid bruit or JVD.  Cardiovascular:     Rate and Rhythm: Normal rate and regular rhythm.     Pulses: Normal pulses.     Heart sounds: Normal heart sounds.     No gallop.  Pulmonary:     Effort: Pulmonary effort is normal. No respiratory distress.     Breath sounds: Normal breath sounds. No wheezing.     Comments: Good air exch Chest:     Chest wall: No tenderness.  Abdominal:     General: Bowel sounds are normal. There is no distension or abdominal bruit.     Palpations: Abdomen is soft. There is no mass.     Tenderness: There is no abdominal tenderness.     Hernia: No hernia is present.  Genitourinary:    Comments: Breast and pelvic exam done by gyn Musculoskeletal:        General: No tenderness. Normal range of motion.     Cervical back: Normal range of motion and neck supple. No rigidity. No muscular tenderness.     Right lower leg: No edema.     Left lower leg: No edema.     Comments: No kyphosis   Lymphadenopathy:     Cervical: No cervical adenopathy.  Skin:    General: Skin is warm and dry.     Coloration: Skin is not pale.     Findings: No erythema or rash.     Comments: Solar lentigines diffusely Few scattered sks   Neurological:     Mental Status: She is alert. Mental status is at baseline.     Cranial Nerves: No cranial nerve deficit.     Motor: No abnormal muscle tone.     Coordination: Coordination normal.     Gait: Gait normal.     Deep Tendon Reflexes: Reflexes are normal and symmetric. Reflexes normal.  Psychiatric:        Mood and Affect: Mood normal.        Cognition and Memory: Cognition and memory normal.           Assessment & Plan:   Problem List Items Addressed This Visit       Digestive   GERD    Pepcid  prn         Other   Routine general medical examination at a health care facility - Primary    Reviewed health habits including diet and exercise and skin cancer prevention Reviewed appropriate screening tests for age  Also reviewed health mt list, fam hx and immunization status , as well as social and family history   See HPI Labs reviewed and ordered Pt plans to get Td at the pharmacy Colonoscopy 3 y recall planned for sept Mammogram utd 05/2022 Sees gyn for  pessary  Dexa ordered-given # to call and schedule/ no falls or fracture/ encouraged strength building exercise and vit D supplementation  PHQ score of 0       Prediabetes    Lab Results  Component Value Date   HGBA1C 6.3 06/14/2022  disc imp of low glycemic diet and wt loss to prevent DM2       PERSONAL HX COLONIC POLYPS    3 y colonoscopy scheduled for 09/2022       Lipid screening    Very good lipi profile with LDL 77 Disc goals for lipids and reasons to control them Rev last labs with pt Rev low sat fat diet in detail       Incontinence of urine in female    Continues use of pessary with estrace vag cream Also gemtesa Sees gyn and urology

## 2022-06-24 NOTE — Patient Instructions (Addendum)
If you want to up date your Td (tetanus) - talk to the pharmacist    Continue walking  Add some strength training to your routine, this is important for bone and brain health and can reduce your risk of falls and help your body use insulin properly and regulate weight  Light weights, exercise bands , and internet videos are a good way to start  Yoga (chair or regular), machines , floor exercises or a gym with machines are also good options    To prevent diabetes  Try to get most of your carbohydrates from produce (with the exception of white potatoes)  Eat less bread/pasta/rice/snack foods/cereals/sweets and other items from the middle of the grocery store (processed carbs)    You have an order for:  []   2D Mammogram  []   3D Mammogram  [x]   Bone Density     Please call for appointment:   []   [facility]  [address] [place] [address]  [phone]  []   [facility])   [address]  [place], [place] [address]  [phone]  [x]   [facility]      [address] [place], [place]        [phone]         []   [facility]  [address] [place], [place]  [phone]  []  [facility]   [address]   [place], [place] [address]  [phone]  []  [facility]  [address] [place], [place] [address] [phone]    Make sure to wear two piece clothing  No lotions powders or deodorants the day of the appointment Make sure to bring picture ID and insurance card.  Bring list of medications you are currently taking including any supplements.   Schedule your screening mammogram through MyChart!   Select [place] imaging sites can now be scheduled through MyChart.  Log into your MyChart account.  Go to  'Visit' (or 'Appointments' if  on mobile App) --> Schedule an  Appointment  Under 'Select a Reason for Visit' choose the Mammogram  Screening option.  Complete the pre-visit questions  and select the time and place that  best fits your schedule

## 2022-06-24 NOTE — Telephone Encounter (Signed)
Patient has requested to reschedule 10/18/22 colonoscopy with Dr. Allegra Lai to 10/08/22 she requested a Tues or Friday.  Vikki in Endo notified of date change.  Referral updated.  Thanks, Ronald, [place]

## 2022-06-24 NOTE — Assessment & Plan Note (Signed)
Pepcid prn.

## 2022-06-24 NOTE — Assessment & Plan Note (Signed)
Lab Results  Component Value Date   HGBA1C 6.3 06/14/2022   disc imp of low glycemic diet and wt loss to prevent DM2

## 2022-06-24 NOTE — Progress Notes (Signed)
I connected with  Leonard Downing Zane on 06/24/22 by a audio enabled telemedicine application and verified that I am speaking with the correct person using two identifiers.  Patient Location: Home  Provider Location: Home Office  I discussed the limitations of evaluation and management by telemedicine. The patient expressed understanding and agreed to proceed.  Subjective:   Shannon [name] is a 72 y.o. female who presents for Medicare Annual (Subsequent) preventive examination.  Review of Systems      Cardiac Risk Factors include: advanced age (>25men, >53 women)     Objective:    Today's Vitals   06/24/22 0756  Weight: 165 lb (74.8 kg)  Height: 5' 8.5" (1.74 m)   Body mass index is 24.72 kg/m.     06/24/2022    8:09 AM 08/13/2021    6:19 AM 08/07/2021    9:11 AM 06/07/2021    1:07 PM 12/25/2020    9:34 AM 09/08/2019    9:49 AM 04/28/2019    9:45 AM  Advanced Directives  Does Patient Have a Medical Advance Directive? Yes Yes Yes Yes Yes Yes Yes  Type of Estate agent of [place];Living will Healthcare Power of [place];Living will Healthcare Power of [place];Living will Healthcare Power of [place];Living will Living will Living will Healthcare Power of [place];Living will  Does patient want to make changes to medical advance directive? No - Patient declined No - Patient declined No - Patient declined No - Patient declined No - Patient declined    Copy of Healthcare Power of Attorney in Chart? Yes - validated most recent copy scanned in chart (See row information)  No - copy requested Yes - validated most recent copy scanned in chart (See row information)   Yes - validated most recent copy scanned in chart (See row information)    Current Medications (verified) Outpatient Encounter Medications as of 06/24/2022  Medication Sig   Calcium Carbonate-Simethicone (PHAZYME GAS & ACID MAX ST PO) Take 1 tablet by mouth as needed (bloating).   cholecalciferol  (VITAMIN D3) 25 MCG (1000 UNIT) tablet Take 1,000 Units by mouth daily.   diclofenac Sodium (VOLTAREN) 1 % GEL Apply 1 Application topically 4 (four) times daily as needed (pain).   docusate sodium (COLACE) 100 MG capsule Take 1 capsule (100 mg total) by mouth daily as needed.   estradiol (ESTRACE) 0.1 MG/GM vaginal cream Apply one pea-sized amount around the opening of the urethra daily for 2 weeks, then 3 times weekly moving forward.   famotidine (PEPCID) 20 MG tablet Take 20 mg by mouth daily as needed for heartburn or indigestion.   MAGNESIUM GLYCINATE PO Take 400 mg by mouth daily.   melatonin 5 MG TABS Take 5 mg by mouth at bedtime.   Misc Natural Products (PUMPKIN SEED OIL PO) Take 200 mg by mouth in the morning and at bedtime.   Multiple Vitamin (MULTIVITAMIN) tablet Take 1 tablet by mouth daily.   St Johns Wort 300 MG TABS Take 600 mg by mouth in the morning and at bedtime.   Vibegron (GEMTESA) 75 MG TABS Take 75 mg by mouth daily.   No facility-administered encounter medications on file as of 06/24/2022.    Allergies (verified) Adhesive [tape], Alpha-gal, Neomycin-bacitracin zn-polymyx, and Nickel   History: Past Medical History:  Diagnosis Date   Arthritis    left foot   Blood transfusion without reported diagnosis    Chronic neck and back pain    received physical therapy   Dysrhythmia  Pt told she had "benign arrythmia" after wearing monitor   GERD (gastroesophageal reflux disease)    with esophagitis   Motion sickness    boats, planes   Numbness    Right outer thigh, constant, notices more with standing for a long period of time   Plantar fasciitis    left   PONV (postoperative nausea and vomiting)    Past Surgical History:  Procedure Laterality Date   ARTHRODESIS METATARSAL Left 12/30/2018   Procedure: ARTHRODESIS,LISFRANC;MULTIPLE LEFT;  Surgeon: Gwyneth Revels, DPM;  Location: [facility];  Service: Podiatry;  Laterality: Left;  general with local    BLADDER SUSPENSION N/A 07/04/2015   Procedure: TRANSVAGINAL TAPE (TVT) SLING                   ;  Surgeon: Allie Bossier, MD;  Location: [facility];  Service: Gynecology;  Laterality: N/A;   COLONOSCOPY     COLONOSCOPY WITH PROPOFOL N/A 09/08/2019   Procedure: COLONOSCOPY WITH PROPOFOL;  Surgeon: Pasty Spillers, MD;  Location: [facility];  Service: Endoscopy;  Laterality: N/A;   CYSTOCELE REPAIR N/A 07/04/2015   Procedure: ANTERIOR REPAIR (CYSTOCELE);  Surgeon: Allie Bossier, MD;  Location: [facility];  Service: Gynecology;  Laterality: N/A;   CYSTOSCOPY N/A 07/04/2015   Procedure: CYSTOSCOPY;  Surgeon: Allie Bossier, MD;  Location: [facility];  Service: Gynecology;  Laterality: N/A;   DILATION AND CURETTAGE OF UTERUS     EXCISION MORTON'S NEUROMA Left 08/12/2018   Procedure: EXCISION MORTON'S NEUROMA;  Surgeon: Gwyneth Revels, DPM;  Location: [facility];  Service: Podiatry;  Laterality: Left;  lma local   POLYPECTOMY     TOTAL KNEE ARTHROPLASTY Right 08/13/2021   Procedure: RIGHT TOTAL KNEE ARTHROPLASTY;  Surgeon: Tarry Kos, MD;  Location: [facility] OR;  Service: Orthopedics;  Laterality: Right;   TUBAL LIGATION  1987   UPPER GI ENDOSCOPY     VAGINAL HYSTERECTOMY N/A 07/04/2015   Procedure: TOTAL VAGINAL HYSTERECTOMY  ;  Surgeon: Allie Bossier, MD;  Location: [facility];  Service: Gynecology;  Laterality: N/A;   Family History  Problem Relation Age of Onset   Stroke Mother        x 2   Heart disease Mother        congenital arrhythmia and CHF   Depression Mother    Atrial fibrillation Mother    Stomach cancer Paternal Uncle    Prostate cancer Brother    Atrial fibrillation Brother    Colon cancer Neg Hx    Esophageal cancer Neg Hx    Kidney cancer Neg Hx    Bladder Cancer Neg Hx    Breast cancer Neg Hx    Social History   Socioeconomic History   Marital status: Married    Spouse name: Not on file   Number of children: Not on file   Years of education: Not on file   Highest education  level: Not on file  Occupational History   Not on file  Tobacco Use   Smoking status: Never   Smokeless tobacco: Never  Vaping Use   Vaping Use: Never used  Substance and Sexual Activity   Alcohol use: No    Alcohol/week: 0.0 standard drinks of alcohol    Comment: rare   Drug use: No   Sexual activity: Not Currently    Birth control/protection: Post-menopausal, Surgical  Other Topics Concern   Not on file  Social History Narrative   Not on file  Social Determinants of Health   Financial Resource Strain: Low Risk  (06/24/2022)   Overall Financial Resource Strain (CARDIA)    Difficulty of Paying Living Expenses: Not hard at all  Food Insecurity: No Food Insecurity (06/24/2022)   Hunger Vital Sign    Worried About Running Out of Food in the Last Year: Never true    Ran Out of Food in the Last Year: Never true  Transportation Needs: No Transportation Needs (06/24/2022)   PRAPARE - Administrator, Civil Service (Medical): No    Lack of Transportation (Non-Medical): No  Physical Activity: Sufficiently Active (06/24/2022)   Exercise Vital Sign    Days of Exercise per Week: 7 days    Minutes of Exercise per Session: 40 min  Stress: No Stress Concern Present (06/24/2022)   [employer] of Occupational Health - Occupational Stress Questionnaire    Feeling of Stress : Not at all  Social Connections: Moderately Integrated (06/24/2022)   Social Connection and Isolation Panel [NHANES]    Frequency of Communication with Friends and Family: More than three times a week    Frequency of Social Gatherings with Friends and Family: Never    Attends Religious Services: Never    Database administrator or Organizations: Yes    Attends Engineer, structural: More than 4 times per year    Marital Status: Married    Tobacco Counseling Counseling given: Not Answered   Clinical Intake:  Pre-visit preparation completed: Yes  Pain : No/denies pain     Nutritional Risks:  None Diabetes: No  How often do you need to have someone help you when you read instructions, pamphlets, or other written materials from your doctor or pharmacy?: 1 - Never  Diabetic? no  Interpreter Needed?: No  Information entered by :: C.Seniah Lawrence LPN   Activities of Daily Living    06/24/2022    8:09 AM 08/07/2021    9:14 AM  In your present state of health, do you have any difficulty performing the following activities:  Hearing? 0   Vision? 0   Difficulty concentrating or making decisions? 0   Walking or climbing stairs? 0   Dressing or bathing? 0   Doing errands, shopping? 0 0  Preparing Food and eating ? N   Using the Toilet? N   In the past six months, have you accidently leaked urine? Y   Comment has overactive bladder   Do you have problems with loss of bowel control? N   Managing your Medications? N   Managing your Finances? N   Housekeeping or managing your Housekeeping? N     Patient Care Team: [place], Audrie Gallus, MD as PCP - General Allie Bossier, MD as Consulting Physician (Obstetrics and Gynecology)  Indicate any recent Medical Services you may have received from other than [facility] providers in the past year (date may be approximate).     Assessment:   This is a routine wellness examination for [place].  Hearing/Vision screen Hearing Screening - Comments:: No hearing issues Vision Screening - Comments:: Glasses - [facility]  Dietary issues and exercise activities discussed: Current Exercise Habits: Home exercise routine, Type of exercise: walking, Time (Minutes): 40, Frequency (Times/Week): 7, Weekly Exercise (Minutes/Week): 280, Exercise limited by: None identified   Goals Addressed             This Visit's Progress    Patient Stated       Walk more.  Depression Screen    06/24/2022    8:08 AM 06/07/2021    1:02 PM 06/06/2020    2:35 PM 04/28/2019    9:47 AM 12/24/2017    9:22 AM 12/25/2016   11:57 AM 11/24/2015    9:25 AM  PHQ 2/9 Scores   PHQ - 2 Score 0 0 0 0 0 0 0  PHQ- 9 Score   2 0 0      Fall Risk    06/24/2022    8:09 AM 06/07/2021    1:06 PM 06/06/2020    2:24 PM 04/28/2019    9:46 AM 12/24/2017    9:22 AM  Fall Risk   Falls in the past year? 0 0 0 0 0  Number falls in past yr: 0 0 0 0   Injury with Fall? 0 0 0 0   Risk for fall due to : No Fall Risks No Fall Risks  No Fall Risks   Follow up Falls prevention discussed;Falls evaluation completed   Falls prevention discussed;Falls evaluation completed     FALL RISK PREVENTION PERTAINING TO THE HOME:  Any stairs in or around the home? Yes  If so, are there any without handrails? No  Home free of loose throw rugs in walkways, pet beds, electrical cords, etc? Yes  Adequate lighting in your home to reduce risk of falls? Yes   ASSISTIVE DEVICES UTILIZED TO PREVENT FALLS:  Life alert? No  Use of a cane, walker or w/c? No  Grab bars in the bathroom? Yes  Shower chair or bench in shower? Yes  Elevated toilet seat or a handicapped toilet? Yes    Cognitive Function:    04/28/2019    9:48 AM 12/24/2017    9:23 AM 12/16/2016    1:36 PM 11/24/2015    9:26 AM  MMSE - Mini Mental State Exam  Orientation to time 5 5 5 5   Orientation to Place 5 5 5 5   Registration 3 3 3 3   Attention/ Calculation 5 0 0 0  Recall 3 3 3 3   Language- name 2 objects  0 0 0  Language- repeat 1 1 1 1   Language- follow 3 step command  3 3 3   Language- read & follow direction  0 0 0  Write a sentence  0 0 0  Copy design  0 0 0  Total score  20 20 20         06/24/2022    8:10 AM 06/07/2021    1:07 PM  6CIT Screen  What Year? 0 points 0 points  What month? 0 points 0 points  What time? 0 points 0 points  Count back from 20 0 points 0 points  Months in reverse 0 points 0 points  Repeat phrase 0 points 0 points  Total Score 0 points 0 points    Immunizations Immunization History  Administered Date(s) Administered   Influenza Whole 01/21/2005, 11/10/2006   Influenza, High Dose  Seasonal PF 10/25/2016, 09/30/2017, 09/10/2018   Influenza-Unspecified 10/31/2012, 11/21/2014, 10/05/2015   PFIZER(Purple Top)SARS-COV-2 Vaccination 02/27/2019, 03/24/2019, 10/21/2019   Pneumococcal Conjugate-13 11/30/2014   Pneumococcal Polysaccharide-23 11/17/2013, 09/10/2018   Td 10/15/2001, 02/09/2010   Zoster Recombinat (Shingrix) 05/03/2016   Zoster, Live 09/04/2010    TDAP status: Due, Education has been provided regarding the importance of this vaccine. Advised may receive this vaccine at local pharmacy or Health Dept. Aware to provide a copy of the vaccination record if obtained from local pharmacy or Health Dept. Verbalized  acceptance and understanding.  Flu Vaccine status: Up to date  Pneumococcal vaccine status: Up to date  Covid-19 vaccine status: Information provided on how to obtain vaccines.   Qualifies for Shingles Vaccine? Yes   Zostavax completed No   Shingrix Completed?: No.    Education has been provided regarding the importance of this vaccine. Patient has been advised to call insurance company to determine out of pocket expense if they have not yet received this vaccine. Advised may also receive vaccine at local pharmacy or Health Dept. Verbalized acceptance and understanding.  Screening Tests Health Maintenance  Topic Date Due   DTaP/Tdap/Td (3 - Tdap) 02/10/2020   Colonoscopy  09/08/2022   INFLUENZA VACCINE  08/22/2022   MAMMOGRAM  05/31/2023   Medicare Annual Wellness (AWV)  06/24/2023   Pneumonia Vaccine 22+ Years old  Completed   DEXA SCAN  Completed   Hepatitis C Screening  Completed   HPV VACCINES  Aged Out   COVID-19 Vaccine  Discontinued   Zoster Vaccines- Shingrix  Discontinued    Health Maintenance  Health Maintenance Due  Topic Date Due   DTaP/Tdap/Td (3 - Tdap) 02/10/2020   Colonoscopy  09/08/2022    Colorectal cancer screening: Type of screening: Colonoscopy. Completed 09/08/2019. Repeat every 3 years  Mammogram status: Completed  05/31/2022. Repeat every year  Bone Density status: Completed 12/18/15. Results reflect: Bone density results: NORMAL. Repeat every 5 years.  Lung Cancer Screening: (Low Dose CT Chest recommended if Age 52-80 years, 30 pack-year currently smoking OR have quit w/in 15years.) does not qualify.   Lung Cancer Screening Referral: no  Additional Screening:  Hepatitis C Screening: does qualify; Completed 11/24/15  Vision Screening: Recommended annual ophthalmology exams for early detection of glaucoma and other disorders of the eye. Is the patient up to date with their annual eye exam?  Yes  Who is the provider or what is the name of the office in which the patient attends annual eye exams? [facility] If pt is not established with a provider, would they like to be referred to a provider to establish care? Yes .   Dental Screening: Recommended annual dental exams for proper oral hygiene  Community Resource Referral / Chronic Care Management: CRR required this visit?  No   CCM required this visit?  No      Plan:     I have personally reviewed and noted the following in the patient's chart:   Medical and social history Use of alcohol, tobacco or illicit drugs  Current medications and supplements including opioid prescriptions. Patient is not currently taking opioid prescriptions. Functional ability and status Nutritional status Physical activity Advanced directives List of other physicians Hospitalizations, surgeries, and ER visits in previous 12 months Vitals Screenings to include cognitive, depression, and falls Referrals and appointments  In addition, I have reviewed and discussed with patient certain preventive protocols, quality metrics, and best practice recommendations. A written personalized care plan for preventive services as well as general preventive health recommendations were provided to patient.     Maryan Puls, LPN   09/21/4780   Nurse Notes:  order placed for dexa scan and colonoscopy.

## 2022-06-24 NOTE — Assessment & Plan Note (Signed)
Reviewed health habits including diet and exercise and skin cancer prevention Reviewed appropriate screening tests for age  Also reviewed health mt list, fam hx and immunization status , as well as social and family history   See HPI Labs reviewed and ordered Pt plans to get Td at the pharmacy Colonoscopy 3 y recall planned for sept Mammogram utd 05/2022 Sees gyn for pessary  Dexa ordered-given # to call and schedule/ no falls or fracture/ encouraged strength building exercise and vit D supplementation  PHQ score of 0

## 2022-06-24 NOTE — Patient Instructions (Signed)
Shannon [name] , Thank you for taking time to come for your Medicare Wellness Visit. I appreciate your ongoing commitment to your health goals. Please review the following plan we discussed and let me know if I can assist you in the future.   These are the goals we discussed:  Goals       Increase physical activity      Starting 12/24/2017, I will continue to exercise with personal trainer for 30 minutes twice weekly and to walk for 1/2-1 mile twice weekly.       Increase physical activity (pt-stated)      After knee surgery.      Patient Stated      04/28/2019, I will continue to walk 2 miles everyday.       Patient Stated      Walk more.        This is a list of the screening recommended for you and due dates:  Health Maintenance  Topic Date Due   DTaP/Tdap/Td vaccine (3 - Tdap) 02/10/2020   Medicare Annual Wellness Visit  06/08/2022   Colon Cancer Screening  09/08/2022   Flu Shot  08/22/2022   Mammogram  05/31/2023   Pneumonia Vaccine  Completed   DEXA scan (bone density measurement)  Completed   Hepatitis C Screening  Completed   HPV Vaccine  Aged Out   COVID-19 Vaccine  Discontinued   Zoster (Shingles) Vaccine  Discontinued    Advanced directives: copy on file in chart.  Conditions/risks identified: none  Next appointment: Follow up in one year for your annual wellness visit 06/25/23 @ 8:15 televisit   Preventive Care 65 Years and Older, Female Preventive care refers to lifestyle choices and visits with your health care provider that can promote health and wellness. What does preventive care include? A yearly physical exam. This is also called an annual well check. Dental exams once or twice a year. Routine eye exams. Ask your health care provider how often you should have your eyes checked. Personal lifestyle choices, including: Daily care of your teeth and gums. Regular physical activity. Eating a healthy diet. Avoiding tobacco and drug use. Limiting alcohol  use. Practicing safe sex. Taking low-dose aspirin every day. Taking vitamin and mineral supplements as recommended by your health care provider. What happens during an annual well check? The services and screenings done by your health care provider during your annual well check will depend on your age, overall health, lifestyle risk factors, and family history of disease. Counseling  Your health care provider may ask you questions about your: Alcohol use. Tobacco use. Drug use. Emotional well-being. Home and relationship well-being. Sexual activity. Eating habits. History of falls. Memory and ability to understand (cognition). Work and work Astronomer. Reproductive health. Screening  You may have the following tests or measurements: Height, weight, and BMI. Blood pressure. Lipid and cholesterol levels. These may be checked every 5 years, or more frequently if you are over 23 years old. Skin check. Lung cancer screening. You may have this screening every year starting at age 4 if you have a 30-pack-year history of smoking and currently smoke or have quit within the past 15 years. Fecal occult blood test (FOBT) of the stool. You may have this test every year starting at age 58. Flexible sigmoidoscopy or colonoscopy. You may have a sigmoidoscopy every 5 years or a colonoscopy every 10 years starting at age 48. Hepatitis C blood test. Hepatitis B blood test. Sexually transmitted disease (STD) testing. Diabetes  screening. This is done by checking your blood sugar (glucose) after you have not eaten for a while (fasting). You may have this done every 1-3 years. Bone density scan. This is done to screen for osteoporosis. You may have this done starting at age 74. Mammogram. This may be done every 1-2 years. Talk to your health care provider about how often you should have regular mammograms. Talk with your health care provider about your test results, treatment options, and if necessary,  the need for more tests. Vaccines  Your health care provider may recommend certain vaccines, such as: Influenza vaccine. This is recommended every year. Tetanus, diphtheria, and acellular pertussis (Tdap, Td) vaccine. You may need a Td booster every 10 years. Zoster vaccine. You may need this after age 19. Pneumococcal 13-valent conjugate (PCV13) vaccine. One dose is recommended after age 17. Pneumococcal polysaccharide (PPSV23) vaccine. One dose is recommended after age 72. Talk to your health care provider about which screenings and vaccines you need and how often you need them. This information is not intended to replace advice given to you by your health care provider. Make sure you discuss any questions you have with your health care provider. Document Released: 02/03/2015 Document Revised: 09/27/2015 Document Reviewed: 11/08/2014 Elsevier Interactive Patient Education  2017 [employer].  Fall Prevention in the Home Falls can cause injuries. They can happen to people of all ages. There are many things you can do to make your home safe and to help prevent falls. What can I do on the outside of my home? Regularly fix the edges of walkways and driveways and fix any cracks. Remove anything that might make you trip as you walk through a door, such as a raised step or threshold. Trim any bushes or trees on the path to your home. Use bright outdoor lighting. Clear any walking paths of anything that might make someone trip, such as rocks or tools. Regularly check to see if handrails are loose or broken. Make sure that both sides of any steps have handrails. Any raised decks and porches should have guardrails on the edges. Have any leaves, snow, or ice cleared regularly. Use sand or salt on walking paths during winter. Clean up any spills in your garage right away. This includes oil or grease spills. What can I do in the bathroom? Use night lights. Install grab bars by the toilet and in the  tub and shower. Do not use towel bars as grab bars. Use non-skid mats or decals in the tub or shower. If you need to sit down in the shower, use a plastic, non-slip stool. Keep the floor dry. Clean up any water that spills on the floor as soon as it happens. Remove soap buildup in the tub or shower regularly. Attach bath mats securely with double-sided non-slip rug tape. Do not have throw rugs and other things on the floor that can make you trip. What can I do in the bedroom? Use night lights. Make sure that you have a light by your bed that is easy to reach. Do not use any sheets or blankets that are too big for your bed. They should not hang down onto the floor. Have a firm chair that has side arms. You can use this for support while you get dressed. Do not have throw rugs and other things on the floor that can make you trip. What can I do in the kitchen? Clean up any spills right away. Avoid walking on wet floors. Keep  items that you use a lot in easy-to-reach places. If you need to reach something above you, use a strong step stool that has a grab bar. Keep electrical cords out of the way. Do not use floor polish or wax that makes floors slippery. If you must use wax, use non-skid floor wax. Do not have throw rugs and other things on the floor that can make you trip. What can I do with my stairs? Do not leave any items on the stairs. Make sure that there are handrails on both sides of the stairs and use them. Fix handrails that are broken or loose. Make sure that handrails are as long as the stairways. Check any carpeting to make sure that it is firmly attached to the stairs. Fix any carpet that is loose or worn. Avoid having throw rugs at the top or bottom of the stairs. If you do have throw rugs, attach them to the floor with carpet tape. Make sure that you have a light switch at the top of the stairs and the bottom of the stairs. If you do not have them, ask someone to add them for  you. What else can I do to help prevent falls? Wear shoes that: Do not have high heels. Have rubber bottoms. Are comfortable and fit you well. Are closed at the toe. Do not wear sandals. If you use a stepladder: Make sure that it is fully opened. Do not climb a closed stepladder. Make sure that both sides of the stepladder are locked into place. Ask someone to hold it for you, if possible. Clearly mark and make sure that you can see: Any grab bars or handrails. First and last steps. Where the edge of each step is. Use tools that help you move around (mobility aids) if they are needed. These include: Canes. Walkers. Scooters. Crutches. Turn on the lights when you go into a dark area. Replace any light bulbs as soon as they burn out. Set up your furniture so you have a clear path. Avoid moving your furniture around. If any of your floors are uneven, fix them. If there are any pets around you, be aware of where they are. Review your medicines with your doctor. Some medicines can make you feel dizzy. This can increase your chance of falling. Ask your doctor what other things that you can do to help prevent falls. This information is not intended to replace advice given to you by your health care provider. Make sure you discuss any questions you have with your health care provider. Document Released: 11/03/2008 Document Revised: 06/15/2015 Document Reviewed: 02/11/2014 Elsevier Interactive Patient Education  2017 [employer].

## 2022-06-24 NOTE — Assessment & Plan Note (Signed)
Very good lipi profile with LDL 77 Disc goals for lipids and reasons to control them Rev last labs with pt Rev low sat fat diet in detail

## 2022-07-31 ENCOUNTER — Encounter: Payer: Self-pay | Admitting: Family Medicine

## 2022-07-31 ENCOUNTER — Ambulatory Visit (INDEPENDENT_AMBULATORY_CARE_PROVIDER_SITE_OTHER): Payer: Medicare PPO | Admitting: Family Medicine

## 2022-07-31 VITALS — BP 123/69 | HR 60 | Ht 68.0 in | Wt 171.0 lb

## 2022-07-31 DIAGNOSIS — Z1339 Encounter for screening examination for other mental health and behavioral disorders: Secondary | ICD-10-CM | POA: Diagnosis not present

## 2022-07-31 DIAGNOSIS — N390 Urinary tract infection, site not specified: Secondary | ICD-10-CM

## 2022-07-31 DIAGNOSIS — Z01419 Encounter for gynecological examination (general) (routine) without abnormal findings: Secondary | ICD-10-CM

## 2022-07-31 MED ORDER — ESTRADIOL 0.1 MG/GM VA CREA
TOPICAL_CREAM | VAGINAL | 12 refills | Status: AC
Start: 2022-07-31 — End: ?

## 2022-07-31 NOTE — Progress Notes (Signed)
Patient presents for Annual.  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

## 2022-07-31 NOTE — Progress Notes (Signed)
Subjective:     Shannon [name] is a 72 y.o. female and is here for a comprehensive physical exam. The patient reports no problems.      The following portions of the patient's history were reviewed and updated as appropriate: allergies, current medications, past family history, past medical history, past social history, past surgical history, and problem list.  Review of Systems Pertinent items noted in HPI and remainder of comprehensive ROS otherwise negative.   Objective:    BP 123/69   Pulse 60   Ht 5\' 8"  (1.727 m)   Wt 171 lb (77.6 kg)   BMI 26.00 kg/m  General appearance: alert, cooperative, and appears stated age Head: Normocephalic, without obvious abnormality, atraumatic Neck: supple, symmetrical, trachea midline and thyroid not enlarged, symmetric, no tenderness/mass/nodules Lungs: clear to auscultation bilaterally Breasts: normal appearance, no masses or tenderness Heart: regular rate and rhythm, S1, S2 normal, no murmur, click, rub or gallop Abdomen: soft, non-tender; bowel sounds normal; no masses,  no organomegaly Extremities: extremities normal, atraumatic, no cyanosis or edema Pulses: 2+ and symmetric Skin: Skin color, texture, turgor normal. No rashes or lesions    Assessment:    Healthy female exam.      Plan:  Encounter for gynecological examination without abnormal finding  Recurrent UTI - Plan: estradiol (ESTRACE) 0.1 MG/GM vaginal cream     See After Visit Summary for Counseling Recommendations

## 2022-08-08 DIAGNOSIS — L03032 Cellulitis of left toe: Secondary | ICD-10-CM | POA: Diagnosis not present

## 2022-08-08 DIAGNOSIS — M79671 Pain in right foot: Secondary | ICD-10-CM | POA: Diagnosis not present

## 2022-08-08 DIAGNOSIS — M7671 Peroneal tendinitis, right leg: Secondary | ICD-10-CM | POA: Diagnosis not present

## 2022-08-08 NOTE — Progress Notes (Unsigned)
Office Visit Note   Patient: Shannon [name]           Date of Birth: 02-20-1950           MRN: [id number] Visit Date: 08/09/2022              Requested by: Tower, Audrie Gallus, MD [address] [place],  [place] [address] PCP: Judy Pimple, MD   Assessment & Plan: Visit Diagnoses:  1. Status post total right knee replacement     Plan: Sojourner is now 1 year status post right total knee replacement.  She is doing well and overall very happy with the outcome.  Dental prophylaxis reinforced.  Recheck in another year with two-view x-rays of the right knee.  Follow-Up Instructions: Return in about 1 year (around 08/09/2023).   Orders:  Orders Placed This Encounter  Procedures   XR Knee 1-2 Views Right   No orders of the defined types were placed in this encounter.     Procedures: No procedures performed   Clinical Data: No additional findings.   Subjective: Chief Complaint  Patient presents with   Right Knee - Follow-up    Hx right TKA    HPI Patient is here for 1 year postop visit.  She has a little bit of stiffness at times.  No real complaints otherwise. Review of Systems   Objective: Vital Signs: There were no vitals taken for this visit.  Physical Exam  Ortho Exam Examination right knee shows fully healed surgical scar.  Excellent painless range of motion Specialty Comments:  No specialty comments available.  Imaging: No results found.   PMFS History: Patient Active Problem List   Diagnosis Date Noted   Lipid screening 06/13/2022   Status post total right knee replacement 08/13/2021   Pre-operative cardiovascular examination 06/10/2021   Pre-op examination 05/31/2021   Primary osteoarthritis of right knee 03/28/2021   Polyp of colon    History of PSVT (paroxysmal supraventricular tachycardia) 06/17/2017   Recurrent UTI 08/23/2016   Estrogen deficiency 12/06/2015   Prediabetes 12/06/2015   Incontinence of urine in female 04/12/2015    Medicare annual wellness visit, subsequent 11/30/2014   Routine general medical examination at a health care facility 11/05/2012   PLANTAR FASCIITIS, LEFT 07/28/2009   PERSONAL HX COLONIC POLYPS 11/02/2007   GERD 10/20/2006   Past Medical History:  Diagnosis Date   Arthritis    left foot   Blood transfusion without reported diagnosis    Chronic neck and back pain    received physical therapy   Dysrhythmia    Pt told she had "benign arrythmia" after wearing monitor   GERD (gastroesophageal reflux disease)    with esophagitis   Motion sickness    boats, planes   Numbness    Right outer thigh, constant, notices more with standing for a long period of time   Plantar fasciitis    left   PONV (postoperative nausea and vomiting)     Family History  Problem Relation Age of Onset   Stroke Mother        x 2   Heart disease Mother        congenital arrhythmia and CHF   Depression Mother    Atrial fibrillation Mother    Stomach cancer Paternal Uncle    Prostate cancer Brother    Atrial fibrillation Brother    Colon cancer Neg Hx    Esophageal cancer Neg Hx    Kidney cancer Neg  Hx    Bladder Cancer Neg Hx    Breast cancer Neg Hx     Past Surgical History:  Procedure Laterality Date   ARTHRODESIS METATARSAL Left 12/30/2018   Procedure: ARTHRODESIS,LISFRANC;MULTIPLE LEFT;  Surgeon: Gwyneth Revels, DPM;  Location: [facility];  Service: Podiatry;  Laterality: Left;  general with local   BLADDER SUSPENSION N/A 07/04/2015   Procedure: TRANSVAGINAL TAPE (TVT) SLING                   ;  Surgeon: Allie Bossier, MD;  Location: [facility];  Service: Gynecology;  Laterality: N/A;   COLONOSCOPY     COLONOSCOPY WITH PROPOFOL N/A 09/08/2019   Procedure: COLONOSCOPY WITH PROPOFOL;  Surgeon: Pasty Spillers, MD;  Location: [facility];  Service: Endoscopy;  Laterality: N/A;   CYSTOCELE REPAIR N/A 07/04/2015   Procedure: ANTERIOR REPAIR (CYSTOCELE);  Surgeon: Allie Bossier, MD;   Location: [facility];  Service: Gynecology;  Laterality: N/A;   CYSTOSCOPY N/A 07/04/2015   Procedure: CYSTOSCOPY;  Surgeon: Allie Bossier, MD;  Location: [facility];  Service: Gynecology;  Laterality: N/A;   DILATION AND CURETTAGE OF UTERUS     EXCISION MORTON'S NEUROMA Left 08/12/2018   Procedure: EXCISION MORTON'S NEUROMA;  Surgeon: Gwyneth Revels, DPM;  Location: [facility];  Service: Podiatry;  Laterality: Left;  lma local   POLYPECTOMY     TOTAL KNEE ARTHROPLASTY Right 08/13/2021   Procedure: RIGHT TOTAL KNEE ARTHROPLASTY;  Surgeon: Tarry Kos, MD;  Location: [facility];  Service: Orthopedics;  Laterality: Right;   TUBAL LIGATION  1987   UPPER GI ENDOSCOPY     VAGINAL HYSTERECTOMY N/A 07/04/2015   Procedure: TOTAL VAGINAL HYSTERECTOMY  ;  Surgeon: Allie Bossier, MD;  Location: [facility];  Service: Gynecology;  Laterality: N/A;   Social History   Occupational History   Not on file  Tobacco Use   Smoking status: Never   Smokeless tobacco: Never  Vaping Use   Vaping status: Never Used  Substance and Sexual Activity   Alcohol use: No    Alcohol/week: 0.0 standard drinks of alcohol    Comment: rare   Drug use: No   Sexual activity: Not Currently    Birth control/protection: Post-menopausal, Surgical

## 2022-08-09 ENCOUNTER — Ambulatory Visit (INDEPENDENT_AMBULATORY_CARE_PROVIDER_SITE_OTHER): Payer: Medicare PPO | Admitting: Orthopaedic Surgery

## 2022-08-09 ENCOUNTER — Other Ambulatory Visit (INDEPENDENT_AMBULATORY_CARE_PROVIDER_SITE_OTHER): Payer: Medicare PPO

## 2022-08-09 DIAGNOSIS — L821 Other seborrheic keratosis: Secondary | ICD-10-CM | POA: Diagnosis not present

## 2022-08-09 DIAGNOSIS — L820 Inflamed seborrheic keratosis: Secondary | ICD-10-CM | POA: Diagnosis not present

## 2022-08-09 DIAGNOSIS — R208 Other disturbances of skin sensation: Secondary | ICD-10-CM | POA: Diagnosis not present

## 2022-08-09 DIAGNOSIS — Z96651 Presence of right artificial knee joint: Secondary | ICD-10-CM

## 2022-08-09 DIAGNOSIS — D225 Melanocytic nevi of trunk: Secondary | ICD-10-CM | POA: Diagnosis not present

## 2022-08-12 ENCOUNTER — Encounter: Payer: Self-pay | Admitting: Internal Medicine

## 2022-08-12 ENCOUNTER — Ambulatory Visit: Payer: Medicare PPO | Admitting: Internal Medicine

## 2022-08-12 VITALS — BP 120/64 | HR 67 | Temp 97.9°F | Ht 68.0 in | Wt 172.0 lb

## 2022-08-12 DIAGNOSIS — H6122 Impacted cerumen, left ear: Secondary | ICD-10-CM | POA: Diagnosis not present

## 2022-08-12 NOTE — Progress Notes (Signed)
Subjective:    Patient ID: Shannon [name], female    DOB: 19-Aug-1950, 72 y.o.   MRN: [id number]  HPI Here due to left ear fullness  Left ear is stopped up Hearing is off Tried OTC drops and flushed --but not effective  No URI symptoms  Current Outpatient Medications on File Prior to Visit  Medication Sig Dispense Refill   Calcium Carbonate-Simethicone (PHAZYME GAS & ACID MAX ST PO) Take 1 tablet by mouth as needed (bloating).     cholecalciferol (VITAMIN D3) 25 MCG (1000 UNIT) tablet Take 1,000 Units by mouth daily.     diclofenac Sodium (VOLTAREN) 1 % GEL Apply 1 Application topically 4 (four) times daily as needed (pain).     estradiol (ESTRACE) 0.1 MG/GM vaginal cream Apply one pea-sized amount around the opening of the urethra daily for 2 weeks, then 3 times weekly moving forward. 42.5 g 12   famotidine (PEPCID) 20 MG tablet Take 20 mg by mouth daily as needed for heartburn or indigestion.     MAGNESIUM GLYCINATE PO Take 400 mg by mouth daily.     melatonin 5 MG TABS Take 5 mg by mouth at bedtime.     Misc Natural Products (PUMPKIN SEED OIL PO) Take 200 mg by mouth in the morning and at bedtime.     Multiple Vitamin (MULTIVITAMIN) tablet Take 1 tablet by mouth daily.     St Johns Wort 300 MG TABS Take 600 mg by mouth in the morning and at bedtime.     Vibegron (GEMTESA) 75 MG TABS Take 75 mg by mouth daily. 90 tablet 3   No current facility-administered medications on file prior to visit.    Allergies  Allergen Reactions   Adhesive [Tape] Rash    (not sure if rash was from ointment or tape)   Alpha-Gal Rash    (red meat S/P tick bite)   Neomycin-Bacitracin Zn-Polymyx Rash    Ointment   Nickel Rash    Past Medical History:  Diagnosis Date   Arthritis    left foot   Blood transfusion without reported diagnosis    Chronic neck and back pain    received physical therapy   Dysrhythmia    Pt told she had "benign arrythmia" after wearing monitor   GERD  (gastroesophageal reflux disease)    with esophagitis   Motion sickness    boats, planes   Numbness    Right outer thigh, constant, notices more with standing for a long period of time   Plantar fasciitis    left   PONV (postoperative nausea and vomiting)     Past Surgical History:  Procedure Laterality Date   ARTHRODESIS METATARSAL Left 12/30/2018   Procedure: ARTHRODESIS,LISFRANC;MULTIPLE LEFT;  Surgeon: Gwyneth Revels, DPM;  Location: [facility];  Service: Podiatry;  Laterality: Left;  general with local   BLADDER SUSPENSION N/A 07/04/2015   Procedure: TRANSVAGINAL TAPE (TVT) SLING                   ;  Surgeon: Allie Bossier, MD;  Location: [facility];  Service: Gynecology;  Laterality: N/A;   COLONOSCOPY     COLONOSCOPY WITH PROPOFOL N/A 09/08/2019   Procedure: COLONOSCOPY WITH PROPOFOL;  Surgeon: Pasty Spillers, MD;  Location: [facility];  Service: Endoscopy;  Laterality: N/A;   CYSTOCELE REPAIR N/A 07/04/2015   Procedure: ANTERIOR REPAIR (CYSTOCELE);  Surgeon: Allie Bossier, MD;  Location: [facility];  Service: Gynecology;  Laterality: N/A;  CYSTOSCOPY N/A 07/04/2015   Procedure: CYSTOSCOPY;  Surgeon: Allie Bossier, MD;  Location: [facility];  Service: Gynecology;  Laterality: N/A;   DILATION AND CURETTAGE OF UTERUS     EXCISION MORTON'S NEUROMA Left 08/12/2018   Procedure: EXCISION MORTON'S NEUROMA;  Surgeon: Gwyneth Revels, DPM;  Location: [facility];  Service: Podiatry;  Laterality: Left;  lma local   POLYPECTOMY     TOTAL KNEE ARTHROPLASTY Right 08/13/2021   Procedure: RIGHT TOTAL KNEE ARTHROPLASTY;  Surgeon: Tarry Kos, MD;  Location: [facility];  Service: Orthopedics;  Laterality: Right;   TUBAL LIGATION  1987   UPPER GI ENDOSCOPY     VAGINAL HYSTERECTOMY N/A 07/04/2015   Procedure: TOTAL VAGINAL HYSTERECTOMY  ;  Surgeon: Allie Bossier, MD;  Location: [facility];  Service: Gynecology;  Laterality: N/A;    Family History  Problem Relation Age of Onset   Stroke Mother         x 2   Heart disease Mother        congenital arrhythmia and CHF   Depression Mother    Atrial fibrillation Mother    Stomach cancer Paternal Uncle    Prostate cancer Brother    Atrial fibrillation Brother    Colon cancer Neg Hx    Esophageal cancer Neg Hx    Kidney cancer Neg Hx    Bladder Cancer Neg Hx    Breast cancer Neg Hx     Social History   Socioeconomic History   Marital status: Married    Spouse name: Not on file   Number of children: Not on file   Years of education: Not on file   Highest education level: Master's degree (e.g., MA, MS, MEng, MEd, MSW, MBA)  Occupational History   Not on file  Tobacco Use   Smoking status: Never   Smokeless tobacco: Never  Vaping Use   Vaping status: Never Used  Substance and Sexual Activity   Alcohol use: No    Alcohol/week: 0.0 standard drinks of alcohol    Comment: rare   Drug use: No   Sexual activity: Not Currently    Birth control/protection: Post-menopausal, Surgical  Other Topics Concern   Not on file  Social History Narrative   Not on file   Social Determinants of Health   Financial Resource Strain: Low Risk  (08/11/2022)   Overall Financial Resource Strain (CARDIA)    Difficulty of Paying Living Expenses: Not hard at all  Food Insecurity: No Food Insecurity (08/11/2022)   Hunger Vital Sign    Worried About Running Out of Food in the Last Year: Never true    Ran Out of Food in the Last Year: Never true  Transportation Needs: No Transportation Needs (08/11/2022)   PRAPARE - Administrator, Civil Service (Medical): No    Lack of Transportation (Non-Medical): No  Physical Activity: Insufficiently Active (08/11/2022)   Exercise Vital Sign    Days of Exercise per Week: 4 days    Minutes of Exercise per Session: 30 min  Stress: No Stress Concern Present (08/11/2022)   [employer] of Occupational Health - Occupational Stress Questionnaire    Feeling of Stress : Only a little  Social Connections:  Socially Integrated (08/11/2022)   Social Connection and Isolation Panel [NHANES]    Frequency of Communication with Friends and Family: More than three times a week    Frequency of Social Gatherings with Friends and Family: Once a week    Attends  Religious Services: 1 to 4 times per year    Active Member of Clubs or Organizations: Yes    Attends Banker Meetings: More than 4 times per year    Marital Status: Married  Catering manager Violence: Not At Risk (06/24/2022)   Humiliation, Afraid, Rape, and Kick questionnaire    Fear of Current or Ex-Partner: No    Emotionally Abused: No    Physically Abused: No    Sexually Abused: No   Review of Systems No tinniuts No recent vertigo     Objective:   Physical Exam Constitutional:      Appearance: Normal appearance.  HENT:     Ears:     Comments: Cerumen bilaterally----slight opening on right  On left--seems to have view of TM --but looks dull and may actually be cerumen there  Neurological:     Mental Status: She is alert.            Assessment & Plan:

## 2022-08-12 NOTE — Assessment & Plan Note (Addendum)
No pain but unclear if total cerumen impaction on left  Will flush so I can reassess TM and canal (unless symptoms resolved)  Significant clear out with lavage Symptoms completely resolved She will use drops regularly to keep cerumen from reaccumulating

## 2022-08-13 ENCOUNTER — Ambulatory Visit: Payer: Medicare PPO | Admitting: Orthopaedic Surgery

## 2022-08-13 ENCOUNTER — Other Ambulatory Visit: Payer: Medicare PPO

## 2022-09-25 ENCOUNTER — Telehealth: Payer: Medicare PPO | Admitting: Family Medicine

## 2022-09-25 DIAGNOSIS — R399 Unspecified symptoms and signs involving the genitourinary system: Secondary | ICD-10-CM

## 2022-09-25 NOTE — Progress Notes (Signed)
Because you have a history of recurrent UTI's your condition warrants further evaluation and I recommend that you be seen for a face to face visit.  Please contact your primary care physician practice to be seen and or Urologist so a sample can be collected.   NOTE: You will NOT be charged for this eVisit.     If you are having a true medical emergency please call 911.   Your e-visit answers were reviewed by a board certified advanced clinical practitioner to complete your personal care plan.  Thank you for using e-Visits.

## 2022-09-26 ENCOUNTER — Ambulatory Visit: Payer: Medicare PPO | Admitting: Internal Medicine

## 2022-09-26 ENCOUNTER — Encounter: Payer: Self-pay | Admitting: Internal Medicine

## 2022-09-26 VITALS — BP 92/68 | HR 63 | Temp 97.5°F | Ht 68.0 in | Wt 168.0 lb

## 2022-09-26 DIAGNOSIS — R300 Dysuria: Secondary | ICD-10-CM | POA: Diagnosis not present

## 2022-09-26 DIAGNOSIS — N390 Urinary tract infection, site not specified: Secondary | ICD-10-CM | POA: Diagnosis not present

## 2022-09-26 LAB — POC URINALSYSI DIPSTICK (AUTOMATED)
Bilirubin, UA: NEGATIVE
Glucose, UA: NEGATIVE
Ketones, UA: NEGATIVE
Nitrite, UA: NEGATIVE
Protein, UA: POSITIVE — AB
Spec Grav, UA: 1.02 (ref 1.010–1.025)
Urobilinogen, UA: 0.2 U/dL
pH, UA: 6 (ref 5.0–8.0)

## 2022-09-26 MED ORDER — CEPHALEXIN 500 MG PO CAPS
500.0000 mg | ORAL_CAPSULE | Freq: Three times a day (TID) | ORAL | 0 refills | Status: DC
Start: 2022-09-26 — End: 2022-10-08

## 2022-09-26 NOTE — Addendum Note (Signed)
Addended by: Eual Fines on: 09/26/2022 08:33 AM   Modules accepted: Orders

## 2022-09-26 NOTE — Progress Notes (Signed)
Subjective:    Patient ID: Shannon [name], female    DOB: 1950/06/26, 72 y.o.   MRN: [id number]  HPI Here due to urinary symptoms  Started with increased urgency and frequency 2 nights ago Mild dysuria Tried increased fluids--helped slightly No fever  Current Outpatient Medications on File Prior to Visit  Medication Sig Dispense Refill   Calcium Carbonate-Simethicone (PHAZYME GAS & ACID MAX ST PO) Take 1 tablet by mouth as needed (bloating).     cholecalciferol (VITAMIN D3) 25 MCG (1000 UNIT) tablet Take 1,000 Units by mouth daily.     diclofenac Sodium (VOLTAREN) 1 % GEL Apply 1 Application topically 4 (four) times daily as needed (pain).     estradiol (ESTRACE) 0.1 MG/GM vaginal cream Apply one pea-sized amount around the opening of the urethra daily for 2 weeks, then 3 times weekly moving forward. 42.5 g 12   famotidine (PEPCID) 20 MG tablet Take 20 mg by mouth daily as needed for heartburn or indigestion.     MAGNESIUM GLYCINATE PO Take 400 mg by mouth daily.     melatonin 5 MG TABS Take 5 mg by mouth at bedtime.     Misc Natural Products (PUMPKIN SEED OIL PO) Take 200 mg by mouth in the morning and at bedtime.     Multiple Vitamin (MULTIVITAMIN) tablet Take 1 tablet by mouth daily.     St Johns Wort 300 MG TABS Take 600 mg by mouth in the morning and at bedtime.     Vibegron (GEMTESA) 75 MG TABS Take 75 mg by mouth daily. 90 tablet 3   No current facility-administered medications on file prior to visit.    Allergies  Allergen Reactions   Adhesive [Tape] Rash    (not sure if rash was from ointment or tape)   Alpha-Gal Rash    (red meat S/P tick bite)   Neomycin-Bacitracin Zn-Polymyx Rash    Ointment   Nickel Rash    Past Medical History:  Diagnosis Date   Arthritis    left foot   Blood transfusion without reported diagnosis    Chronic neck and back pain    received physical therapy   Dysrhythmia    Pt told she had "benign arrythmia" after wearing monitor    GERD (gastroesophageal reflux disease)    with esophagitis   Motion sickness    boats, planes   Numbness    Right outer thigh, constant, notices more with standing for a long period of time   Plantar fasciitis    left   PONV (postoperative nausea and vomiting)     Past Surgical History:  Procedure Laterality Date   ARTHRODESIS METATARSAL Left 12/30/2018   Procedure: ARTHRODESIS,LISFRANC;MULTIPLE LEFT;  Surgeon: Gwyneth Revels, DPM;  Location: [facility];  Service: Podiatry;  Laterality: Left;  general with local   BLADDER SUSPENSION N/A 07/04/2015   Procedure: TRANSVAGINAL TAPE (TVT) SLING                   ;  Surgeon: Allie Bossier, MD;  Location: [facility];  Service: Gynecology;  Laterality: N/A;   COLONOSCOPY     COLONOSCOPY WITH PROPOFOL N/A 09/08/2019   Procedure: COLONOSCOPY WITH PROPOFOL;  Surgeon: Pasty Spillers, MD;  Location: [facility];  Service: Endoscopy;  Laterality: N/A;   CYSTOCELE REPAIR N/A 07/04/2015   Procedure: ANTERIOR REPAIR (CYSTOCELE);  Surgeon: Allie Bossier, MD;  Location: [facility];  Service: Gynecology;  Laterality: N/A;   CYSTOSCOPY N/A 07/04/2015  Procedure: CYSTOSCOPY;  Surgeon: Allie Bossier, MD;  Location: [facility];  Service: Gynecology;  Laterality: N/A;   DILATION AND CURETTAGE OF UTERUS     EXCISION MORTON'S NEUROMA Left 08/12/2018   Procedure: EXCISION MORTON'S NEUROMA;  Surgeon: Gwyneth Revels, DPM;  Location: [facility];  Service: Podiatry;  Laterality: Left;  lma local   POLYPECTOMY     TOTAL KNEE ARTHROPLASTY Right 08/13/2021   Procedure: RIGHT TOTAL KNEE ARTHROPLASTY;  Surgeon: Tarry Kos, MD;  Location: [facility];  Service: Orthopedics;  Laterality: Right;   TUBAL LIGATION  1987   UPPER GI ENDOSCOPY     VAGINAL HYSTERECTOMY N/A 07/04/2015   Procedure: TOTAL VAGINAL HYSTERECTOMY  ;  Surgeon: Allie Bossier, MD;  Location: [facility];  Service: Gynecology;  Laterality: N/A;    Family History  Problem Relation Age of Onset   Stroke Mother         x 2   Heart disease Mother        congenital arrhythmia and CHF   Depression Mother    Atrial fibrillation Mother    Stomach cancer Paternal Uncle    Prostate cancer Brother    Atrial fibrillation Brother    Colon cancer Neg Hx    Esophageal cancer Neg Hx    Kidney cancer Neg Hx    Bladder Cancer Neg Hx    Breast cancer Neg Hx     Social History   Socioeconomic History   Marital status: Married    Spouse name: Not on file   Number of children: Not on file   Years of education: Not on file   Highest education level: Master's degree (e.g., MA, MS, MEng, MEd, MSW, MBA)  Occupational History   Not on file  Tobacco Use   Smoking status: Never   Smokeless tobacco: Never  Vaping Use   Vaping status: Never Used  Substance and Sexual Activity   Alcohol use: No    Alcohol/week: 0.0 standard drinks of alcohol    Comment: rare   Drug use: No   Sexual activity: Not Currently    Birth control/protection: Post-menopausal, Surgical  Other Topics Concern   Not on file  Social History Narrative   Not on file   Social Determinants of Health   Financial Resource Strain: Low Risk  (08/11/2022)   Overall Financial Resource Strain (CARDIA)    Difficulty of Paying Living Expenses: Not hard at all  Food Insecurity: No Food Insecurity (08/11/2022)   Hunger Vital Sign    Worried About Running Out of Food in the Last Year: Never true    Ran Out of Food in the Last Year: Never true  Transportation Needs: No Transportation Needs (08/11/2022)   PRAPARE - Administrator, Civil Service (Medical): No    Lack of Transportation (Non-Medical): No  Physical Activity: Insufficiently Active (08/11/2022)   Exercise Vital Sign    Days of Exercise per Week: 4 days    Minutes of Exercise per Session: 30 min  Stress: No Stress Concern Present (08/11/2022)   [employer] of Occupational Health - Occupational Stress Questionnaire    Feeling of Stress : Only a little  Social  Connections: Socially Integrated (08/11/2022)   Social Connection and Isolation Panel [NHANES]    Frequency of Communication with Friends and Family: More than three times a week    Frequency of Social Gatherings with Friends and Family: Once a week    Attends Religious Services: 1 to 4  times per year    Active Member of Clubs or Organizations: Yes    Attends Banker Meetings: More than 4 times per year    Marital Status: Married  Catering manager Violence: Not At Risk (06/24/2022)   Humiliation, Afraid, Rape, and Kick questionnaire    Fear of Current or Ex-Partner: No    Emotionally Abused: No    Physically Abused: No    Sexually Abused: No   Review of Systems No back pain No N/V Eating okay Might have gotten a little dry during the recent hot spell---usually tries to keep up with fluids    Objective:   Physical Exam Constitutional:      Appearance: Normal appearance.  Abdominal:     Palpations: Abdomen is soft.     Tenderness: There is no right CVA tenderness or left CVA tenderness.     Comments: Slight suprapubic tenderness  Neurological:     Mental Status: She is alert.            Assessment & Plan:

## 2022-09-26 NOTE — Assessment & Plan Note (Signed)
Recurrence has been delayed by the vaginal estrogen Discussed considering daily cranberry ---she will consider No history of resistance Will treat with cephalexin 500 tid---- 3 days if symptoms gone tomorrow, 7 days if longer for relief of symptoms

## 2022-09-29 LAB — URINE CULTURE
MICRO NUMBER:: 15426277
SPECIMEN QUALITY:: ADEQUATE

## 2022-10-01 ENCOUNTER — Encounter: Payer: Self-pay | Admitting: Gastroenterology

## 2022-10-03 ENCOUNTER — Encounter: Payer: Self-pay | Admitting: Internal Medicine

## 2022-10-07 ENCOUNTER — Encounter: Payer: Self-pay | Admitting: Gastroenterology

## 2022-10-08 ENCOUNTER — Encounter
Admission: RE | Disposition: A | Discharge status: Home / Self Care | Payer: Self-pay | Source: Home / Self Care | Attending: Gastroenterology

## 2022-10-08 ENCOUNTER — Ambulatory Visit: Payer: Medicare PPO | Admitting: Certified Registered"

## 2022-10-08 ENCOUNTER — Ambulatory Visit
Admission: RE | Admit: 2022-10-08 | Discharge: 2022-10-08 | Disposition: A | Discharge status: Home / Self Care | Payer: Medicare PPO | Attending: Gastroenterology | Admitting: Gastroenterology

## 2022-10-08 ENCOUNTER — Encounter: Payer: Self-pay | Admitting: Gastroenterology

## 2022-10-08 DIAGNOSIS — Z860100 Personal history of colon polyps, unspecified: Secondary | ICD-10-CM

## 2022-10-08 DIAGNOSIS — Z1211 Encounter for screening for malignant neoplasm of colon: Secondary | ICD-10-CM | POA: Insufficient documentation

## 2022-10-08 DIAGNOSIS — K219 Gastro-esophageal reflux disease without esophagitis: Secondary | ICD-10-CM | POA: Diagnosis not present

## 2022-10-08 DIAGNOSIS — K621 Rectal polyp: Secondary | ICD-10-CM | POA: Insufficient documentation

## 2022-10-08 DIAGNOSIS — K635 Polyp of colon: Secondary | ICD-10-CM | POA: Diagnosis not present

## 2022-10-08 HISTORY — PX: COLONOSCOPY WITH PROPOFOL: SHX5780

## 2022-10-08 HISTORY — PX: POLYPECTOMY: SHX5525

## 2022-10-08 SURGERY — COLONOSCOPY WITH PROPOFOL
Anesthesia: General

## 2022-10-08 MED ORDER — PROPOFOL 10 MG/ML IV BOLUS
INTRAVENOUS | Status: DC | PRN
  Administered 2022-10-08: 70 mg via INTRAVENOUS

## 2022-10-08 MED ORDER — LIDOCAINE HCL (CARDIAC) PF 100 MG/5ML IV SOSY
PREFILLED_SYRINGE | INTRAVENOUS | Status: DC | PRN
  Administered 2022-10-08: 50 mg via INTRAVENOUS

## 2022-10-08 MED ORDER — SODIUM CHLORIDE 0.9 % IV SOLN: INTRAVENOUS | Status: DC

## 2022-10-08 MED ORDER — PROPOFOL 500 MG/50ML IV EMUL
INTRAVENOUS | Status: DC | PRN
  Administered 2022-10-08: 130 ug/kg/min via INTRAVENOUS

## 2022-10-08 NOTE — H&P (Signed)
Arlyss Repress, MD [address]  [address]  [place], [place] [address]  Main: [phone]  Fax: [phone] Pager: [phone]  Primary Care Physician:  Tower, Audrie Gallus, MD Primary Gastroenterologist:  Dr. Arlyss Repress  Pre-Procedure History & Physical: HPI:  Shannon [name] is a 72 y.o. female is here for a colonoscopy.   Past Medical History:  Diagnosis Date   Arthritis    left foot   Blood transfusion without reported diagnosis    Chronic neck and back pain    received physical therapy   Dysrhythmia    Pt told she had "benign arrythmia" after wearing monitor   GERD (gastroesophageal reflux disease)    with esophagitis   Motion sickness    boats, planes   Numbness    Right outer thigh, constant, notices more with standing for a long period of time   Plantar fasciitis    left   PONV (postoperative nausea and vomiting)     Past Surgical History:  Procedure Laterality Date   ARTHRODESIS METATARSAL Left 12/30/2018   Procedure: ARTHRODESIS,LISFRANC;MULTIPLE LEFT;  Surgeon: Gwyneth Revels, DPM;  Location: [facility];  Service: Podiatry;  Laterality: Left;  general with local   BLADDER SUSPENSION N/A 07/04/2015   Procedure: TRANSVAGINAL TAPE (TVT) SLING                   ;  Surgeon: Allie Bossier, MD;  Location: [facility];  Service: Gynecology;  Laterality: N/A;   COLONOSCOPY     COLONOSCOPY WITH PROPOFOL N/A 09/08/2019   Procedure: COLONOSCOPY WITH PROPOFOL;  Surgeon: Pasty Spillers, MD;  Location: [facility];  Service: Endoscopy;  Laterality: N/A;   CYSTOCELE REPAIR N/A 07/04/2015   Procedure: ANTERIOR REPAIR (CYSTOCELE);  Surgeon: Allie Bossier, MD;  Location: [facility];  Service: Gynecology;  Laterality: N/A;   CYSTOSCOPY N/A 07/04/2015   Procedure: CYSTOSCOPY;  Surgeon: Allie Bossier, MD;  Location: [facility];  Service: Gynecology;  Laterality: N/A;   DILATION AND CURETTAGE OF UTERUS     EXCISION MORTON'S NEUROMA Left 08/12/2018   Procedure: EXCISION  MORTON'S NEUROMA;  Surgeon: Gwyneth Revels, DPM;  Location: [facility];  Service: Podiatry;  Laterality: Left;  lma local   POLYPECTOMY     TOTAL KNEE ARTHROPLASTY Right 08/13/2021   Procedure: RIGHT TOTAL KNEE ARTHROPLASTY;  Surgeon: Tarry Kos, MD;  Location: [facility];  Service: Orthopedics;  Laterality: Right;   TUBAL LIGATION  1987   UPPER GI ENDOSCOPY     VAGINAL HYSTERECTOMY N/A 07/04/2015   Procedure: TOTAL VAGINAL HYSTERECTOMY  ;  Surgeon: Allie Bossier, MD;  Location: [facility];  Service: Gynecology;  Laterality: N/A;    Prior to Admission medications   Medication Sig Start Date End Date Taking? Authorizing Provider  cholecalciferol (VITAMIN D3) 25 MCG (1000 UNIT) tablet Take 1,000 Units by mouth daily.   Yes [provider]  famotidine (PEPCID) 20 MG tablet Take 20 mg by mouth daily as needed for heartburn or indigestion.   Yes [provider]  Vibegron (GEMTESA) 75 MG TABS Take 75 mg by mouth daily. 11/20/21  Yes McGowan, Carollee Herter A, PA-C  Calcium Carbonate-Simethicone (PHAZYME GAS & ACID MAX ST PO) Take 1 tablet by mouth as needed (bloating).    [provider]  cephALEXin (KEFLEX) 500 MG capsule Take 1 capsule (500 mg total) by mouth 3 (three) times daily. Patient not taking: Reported on 10/08/2022 09/26/22   Karie Schwalbe, MD  diclofenac Sodium (VOLTAREN) 1 % GEL Apply 1 Application topically 4 (four) times daily as needed (pain).    [provider]  estradiol (ESTRACE) 0.1 MG/GM vaginal cream Apply one pea-sized amount around the opening of the urethra daily for 2 weeks, then 3 times weekly moving forward. 07/31/22   Reva Bores, MD  MAGNESIUM GLYCINATE PO Take 400 mg by mouth daily.    [provider]  melatonin 5 MG TABS Take 5 mg by mouth at bedtime.    [provider]  Misc Natural Products (PUMPKIN SEED OIL PO) Take 200 mg by mouth in the morning and at bedtime.    [provider]  Multiple Vitamin  (MULTIVITAMIN) tablet Take 1 tablet by mouth daily.    [provider]  [facility] Wort 300 MG TABS Take 600 mg by mouth in the morning and at bedtime.    [provider]    Allergies as of 06/24/2022 - Review Complete 06/24/2022  Allergen Reaction Noted   Adhesive [tape] Rash 08/05/2018   Alpha-gal Rash 08/05/2018   Neomycin-bacitracin zn-polymyx Rash 10/20/2006   Nickel Rash 07/31/2021    Family History  Problem Relation Age of Onset   Stroke Mother        x 2   Heart disease Mother        congenital arrhythmia and CHF   Depression Mother    Atrial fibrillation Mother    Stomach cancer Paternal Uncle    Prostate cancer Brother    Atrial fibrillation Brother    Colon cancer Neg Hx    Esophageal cancer Neg Hx    Kidney cancer Neg Hx    Bladder Cancer Neg Hx    Breast cancer Neg Hx     Social History   Socioeconomic History   Marital status: Married    Spouse name: Not on file   Number of children: Not on file   Years of education: Not on file   Highest education level: Master's degree (e.g., MA, MS, MEng, MEd, MSW, MBA)  Occupational History   Not on file  Tobacco Use   Smoking status: Never   Smokeless tobacco: Never  Vaping Use   Vaping status: Never Used  Substance and Sexual Activity   Alcohol use: No    Alcohol/week: 0.0 standard drinks of alcohol    Comment: rare   Drug use: No   Sexual activity: Not Currently    Birth control/protection: Post-menopausal, Surgical  Other Topics Concern   Not on file  Social History Narrative   Not on file   Social Determinants of Health   Financial Resource Strain: Low Risk  (08/11/2022)   Overall Financial Resource Strain (CARDIA)    Difficulty of Paying Living Expenses: Not hard at all  Food Insecurity: No Food Insecurity (08/11/2022)   Hunger Vital Sign    Worried About Running Out of Food in the Last Year: Never true    Ran Out of Food in the Last Year: Never true  Transportation Needs: No  Transportation Needs (08/11/2022)   PRAPARE - Administrator, Civil Service (Medical): No    Lack of Transportation (Non-Medical): No  Physical Activity: Insufficiently Active (08/11/2022)   Exercise Vital Sign    Days of Exercise per Week: 4 days    Minutes of Exercise per Session: 30 min  Stress: No Stress Concern Present (08/11/2022)   [employer] of Occupational Health - Occupational Stress Questionnaire    Feeling of Stress :  Only a little  Social Connections: Socially Integrated (08/11/2022)   Social Connection and Isolation Panel [NHANES]    Frequency of Communication with Friends and Family: More than three times a week    Frequency of Social Gatherings with Friends and Family: Once a week    Attends Religious Services: 1 to 4 times per year    Active Member of [employer] or Organizations: Yes    Attends Engineer, structural: More than 4 times per year    Marital Status: Married  Catering manager Violence: Not At Risk (06/24/2022)   Humiliation, Afraid, Rape, and Kick questionnaire    Fear of Current or Ex-Partner: No    Emotionally Abused: No    Physically Abused: No    Sexually Abused: No    Review of Systems: See HPI, otherwise negative ROS  Physical Exam: BP 136/74   Pulse (!) 56   Temp (!) 96.9 F (36.1 C) (Temporal)   Resp 16   Wt 74.7 kg   SpO2 100%   BMI 25.03 kg/m  General:   Alert,  pleasant and cooperative in NAD Head:  Normocephalic and atraumatic. Neck:  Supple; no masses or thyromegaly. Lungs:  Clear throughout to auscultation.    Heart:  Regular rate and rhythm. Abdomen:  Soft, nontender and nondistended. Normal bowel sounds, without guarding, and without rebound.   Neurologic:  Alert and  oriented x4;  grossly normal neurologically.  Impression/Plan: Shannon [name] is here for an colonoscopy to be performed for h/o colon adenomas  Risks, benefits, limitations, and alternatives regarding  colonoscopy have been reviewed  with the patient.  Questions have been answered.  All parties agreeable.   Lannette Donath, MD  10/08/2022, 8:20 AM

## 2022-10-08 NOTE — Transfer of Care (Signed)
Immediate Anesthesia Transfer of Care Note  Patient: Shannon [name]  Procedure(s) Performed: COLONOSCOPY WITH PROPOFOL POLYPECTOMY  Patient Location: Endoscopy Unit  Anesthesia Type:General  Level of Consciousness: drowsy  Airway & Oxygen Therapy: Patient Spontanous Breathing  Post-op Assessment: Report given to RN  Post vital signs: stable  Last Vitals:  Vitals Value Taken Time  BP    Temp 35.9 C 10/08/22 0845  Pulse 57 10/08/22 0845  Resp 16 10/08/22 0845  SpO2 99 % 10/08/22 0845  Vitals shown include unfiled device data.  Last Pain:  Vitals:   10/08/22 0845  TempSrc: Temporal         Complications: No notable events documented.

## 2022-10-08 NOTE — Anesthesia Preprocedure Evaluation (Signed)
Anesthesia Evaluation  Patient identified by MRN, date of birth, ID band Patient awake    Reviewed: Allergy & Precautions, NPO status , Patient's Chart, lab work & pertinent test results  Airway Mallampati: III  TM Distance: >3 FB Neck ROM: full    Dental  (+) Chipped, Dental Advidsory Given   Pulmonary neg pulmonary ROS   Pulmonary exam normal        Cardiovascular negative cardio ROS Normal cardiovascular exam     Neuro/Psych negative neurological ROS  negative psych ROS   GI/Hepatic Neg liver ROS,GERD  Medicated,,  Endo/Other  negative endocrine ROS    Renal/GU negative Renal ROS  negative genitourinary   Musculoskeletal   Abdominal   Peds  Hematology negative hematology ROS (+)   Anesthesia Other Findings Past Medical History: No date: Arthritis     Comment:  left foot No date: Blood transfusion without reported diagnosis No date: Chronic neck and back pain     Comment:  received physical therapy No date: Dysrhythmia     Comment:  Pt told she had "benign arrythmia" after wearing monitor No date: GERD (gastroesophageal reflux disease)     Comment:  with esophagitis No date: Motion sickness     Comment:  boats, planes No date: Numbness     Comment:  Right outer thigh, constant, notices more with standing               for a long period of time No date: Plantar fasciitis     Comment:  left No date: PONV (postoperative nausea and vomiting)  Past Surgical History: 12/30/2018: ARTHRODESIS METATARSAL; Left     Comment:  Procedure: ARTHRODESIS,LISFRANC;MULTIPLE LEFT;  Surgeon:              Gwyneth Revels, DPM;  Location: [facility];                Service: Podiatry;  Laterality: Left;  general with local 07/04/2015: BLADDER SUSPENSION; N/A     Comment:  Procedure: TRANSVAGINAL TAPE (TVT) SLING                              ;  Surgeon: Allie Bossier, MD;  Location: [facility];  Service:               Gynecology;  Laterality: N/A; No date: COLONOSCOPY 09/08/2019: COLONOSCOPY WITH PROPOFOL; N/A     Comment:  Procedure: COLONOSCOPY WITH PROPOFOL;  Surgeon:               Pasty Spillers, MD;  Location: [facility];                Service: Endoscopy;  Laterality: N/A; 07/04/2015: CYSTOCELE REPAIR; N/A     Comment:  Procedure: ANTERIOR REPAIR (CYSTOCELE);  Surgeon: Allie Bossier, MD;  Location: [facility];  Service: Gynecology;                Laterality: N/A; 07/04/2015: CYSTOSCOPY; N/A     Comment:  Procedure: CYSTOSCOPY;  Surgeon: Allie Bossier, MD;                Location: [facility];  Service: Gynecology;  Laterality: N/A; No date: DILATION AND CURETTAGE OF UTERUS 08/12/2018: EXCISION MORTON'S NEUROMA; Left     Comment:  Procedure: EXCISION MORTON'S NEUROMA;  Surgeon: Ether Griffins,  Jill Alexanders, DPM;  Location: [facility];  Service:               Podiatry;  Laterality: Left;  lma local No date: POLYPECTOMY 08/13/2021: TOTAL KNEE ARTHROPLASTY; Right     Comment:  Procedure: RIGHT TOTAL KNEE ARTHROPLASTY;  Surgeon: Tarry Kos, MD;  Location: [facility];  Service: Orthopedics;                Laterality: Right; 1987: TUBAL LIGATION No date: UPPER GI ENDOSCOPY 07/04/2015: VAGINAL HYSTERECTOMY; N/A     Comment:  Procedure: TOTAL VAGINAL HYSTERECTOMY  ;  Surgeon: Allie Bossier, MD;  Location: [facility];  Service: Gynecology;                Laterality: N/A;     Reproductive/Obstetrics negative OB ROS                             Anesthesia Physical Anesthesia Plan  ASA: 2  Anesthesia Plan: General   Post-op Pain Management: Minimal or no pain anticipated   Induction: Intravenous  PONV Risk Score and Plan: 3 and Propofol infusion, TIVA and Ondansetron  Airway Management Planned: Nasal Cannula  Additional Equipment: None  Intra-op Plan:   Post-operative Plan:   Informed Consent: I have reviewed the patients  History and Physical, chart, labs and discussed the procedure including the risks, benefits and alternatives for the proposed anesthesia with the patient or authorized representative who has indicated his/her understanding and acceptance.     Dental advisory given  Plan Discussed with: CRNA and Surgeon  Anesthesia Plan Comments: (Discussed risks of anesthesia with patient, including possibility of difficulty with spontaneous ventilation under anesthesia necessitating airway intervention, PONV, and rare risks such as cardiac or respiratory or neurological events, and allergic reactions. Discussed the role of CRNA in patient's perioperative care. Patient understands.)       Anesthesia Quick Evaluation

## 2022-10-08 NOTE — Op Note (Signed)
[facility] Gastroenterology Patient Name: Shannon [name] Procedure Date: 10/08/2022 8:14 AM MRN: [id number] Account #: [id number] Date of Birth: 02-Nov-1950 Admit Type: Outpatient Age: 72 Room: [facility] ENDO ROOM 3 Gender: Female Note Status: Finalized Instrument Name: Peds Colonoscope [id number] Procedure:             Colonoscopy Indications:           Surveillance: Personal history of adenomatous polyps                         on last colonoscopy 3 years ago, Last colonoscopy:                         August 2021 Providers:             Toney Reil MD, MD Medicines:             General Anesthesia Complications:         No immediate complications. Estimated blood loss: None. Procedure:             Pre-Anesthesia Assessment:                        - Prior to the procedure, a History and Physical was                         performed, and patient medications and allergies were                         reviewed. The patient is competent. The risks and                         benefits of the procedure and the sedation options and                         risks were discussed with the patient. All questions                         were answered and informed consent was obtained.                         Patient identification and proposed procedure were                         verified by the physician, the nurse, the                         anesthesiologist, the anesthetist and the technician                         in the pre-procedure area in the procedure room in the                         endoscopy suite. Mental Status Examination: alert and                         oriented. Airway Examination: normal oropharyngeal                         airway and neck  mobility. Respiratory Examination:                         clear to auscultation. CV Examination: normal.                         Prophylactic Antibiotics: The patient does not require                          prophylactic antibiotics. Prior Anticoagulants: The                         patient has taken no anticoagulant or antiplatelet                         agents. ASA Grade Assessment: II - A patient with mild                         systemic disease. After reviewing the risks and                         benefits, the patient was deemed in satisfactory                         condition to undergo the procedure. The anesthesia                         plan was to use general anesthesia. Immediately prior                         to administration of medications, the patient was                         re-assessed for adequacy to receive sedatives. The                         heart rate, respiratory rate, oxygen saturations,                         blood pressure, adequacy of pulmonary ventilation, and                         response to care were monitored throughout the                         procedure. The physical status of the patient was                         re-assessed after the procedure.                        After obtaining informed consent, the colonoscope was                         passed under direct vision. Throughout the procedure,                         the patient's blood pressure, pulse, and oxygen  saturations were monitored continuously. The                         Colonoscope was introduced through the anus and                         advanced to the the cecum, identified by appendiceal                         orifice and ileocecal valve. The colonoscopy was                         performed without difficulty. The patient tolerated                         the procedure well. The quality of the bowel                         preparation was evaluated using the BBPS [facility] Bowel                         Preparation Scale) with scores of: Right Colon = 3,                         Transverse Colon = 3 and Left Colon = 3 (entire mucosa                          seen well with no residual staining, small fragments                         of stool or opaque liquid). The total BBPS score                         equals 9. The ileocecal valve, appendiceal orifice,                         and rectum were photographed. Findings:      The perianal and digital rectal examinations were normal. Pertinent       negatives include normal sphincter tone and no palpable rectal lesions.      A diminutive polyp was found in the distal rectum. The polyp was       sessile. The polyp was removed with a cold biopsy forceps. Resection and       retrieval were complete.      The retroflexed view of the distal rectum and anal verge was normal and       showed no anal or rectal abnormalities.      The exam was otherwise without abnormality. Impression:            - One diminutive polyp in the distal rectum, removed                         with a cold biopsy forceps. Resected and retrieved.                        - The distal rectum and anal verge are normal on  retroflexion view.                        - The examination was otherwise normal. Recommendation:        - Discharge patient to home (with escort).                        - Resume previous diet today.                        - Continue present medications.                        - Await pathology results.                        - Repeat colonoscopy in 7-10 years for surveillance                         based on pathology results. Procedure Code(s):     --- Professional ---                        [phone], Colonoscopy, flexible; with biopsy, single or                         multiple Diagnosis Code(s):     --- Professional ---                        Z86.010, Personal history of colonic polyps                        D12.8, Benign neoplasm of rectum CPT copyright 2022 American Medical Association. All rights reserved. The codes documented in this report are preliminary and upon coder review  may  be revised to meet current compliance requirements. Dr. Libby Maw Toney Reil MD, MD 10/08/2022 8:42:12 AM This report has been signed electronically. Number of Addenda: 0 Note Initiated On: 10/08/2022 8:14 AM Scope Withdrawal Time: 0 hours 7 minutes 24 seconds  Total Procedure Duration: 0 hours 12 minutes 8 seconds  Estimated Blood Loss:  Estimated blood loss: none.      [facility]

## 2022-10-08 NOTE — Anesthesia Postprocedure Evaluation (Signed)
Anesthesia Post Note  Patient: Shannon [name]  Procedure(s) Performed: COLONOSCOPY WITH PROPOFOL POLYPECTOMY  Patient location during evaluation: PACU Anesthesia Type: General Level of consciousness: awake and alert Pain management: pain level controlled Vital Signs Assessment: post-procedure vital signs reviewed and stable Respiratory status: spontaneous breathing, nonlabored ventilation, respiratory function stable and patient connected to nasal cannula oxygen Cardiovascular status: blood pressure returned to baseline and stable Postop Assessment: no apparent nausea or vomiting Anesthetic complications: no  No notable events documented.   Last Vitals:  Vitals:   10/08/22 0809 10/08/22 0845  BP: 136/74 (!) 90/46  Pulse: (!) 56 60  Resp: 16 17  Temp: (!) 36.1 C (!) 35.9 C  SpO2: 100% 99%    Last Pain:  Vitals:   10/08/22 0845  TempSrc: Temporal  PainSc: Asleep                 Stephanie Coup

## 2022-10-09 ENCOUNTER — Encounter: Payer: Self-pay | Admitting: Gastroenterology

## 2022-10-12 DIAGNOSIS — R300 Dysuria: Secondary | ICD-10-CM | POA: Diagnosis not present

## 2022-10-12 DIAGNOSIS — N390 Urinary tract infection, site not specified: Secondary | ICD-10-CM | POA: Diagnosis not present

## 2022-10-28 ENCOUNTER — Encounter: Payer: Self-pay | Admitting: Internal Medicine

## 2022-10-28 ENCOUNTER — Ambulatory Visit: Payer: Medicare PPO | Admitting: Internal Medicine

## 2022-10-28 VITALS — BP 102/80 | HR 67 | Temp 99.5°F | Ht 68.0 in | Wt 166.0 lb

## 2022-10-28 DIAGNOSIS — U071 COVID-19: Secondary | ICD-10-CM | POA: Diagnosis not present

## 2022-10-28 DIAGNOSIS — R509 Fever, unspecified: Secondary | ICD-10-CM

## 2022-10-28 LAB — POC COVID19 BINAXNOW: SARS Coronavirus 2 Ag: POSITIVE — AB

## 2022-10-28 NOTE — Progress Notes (Signed)
Subjective:    Patient ID: Shannon [name], female    DOB: Dec 09, 1950, 72 y.o.   MRN: [id number]  HPI Here due to respiratory illness  Started 2 days ago---but really lousy last night At first mild sore throat and swollen glands in neck Fever and chills, congestion and cough Still with fever--better with tylenol Some muscle aches Cough--dry.  Nasal congestion--affecting breathing but no real SOB No headaches  Tried nasal decongestant spray--unclear help  Did test yesterday--was positive but expired  Current Outpatient Medications on File Prior to Visit  Medication Sig Dispense Refill   Calcium Carbonate-Simethicone (PHAZYME GAS & ACID MAX ST PO) Take 1 tablet by mouth as needed (bloating).     cholecalciferol (VITAMIN D3) 25 MCG (1000 UNIT) tablet Take 1,000 Units by mouth daily.     diclofenac Sodium (VOLTAREN) 1 % GEL Apply 1 Application topically 4 (four) times daily as needed (pain).     estradiol (ESTRACE) 0.1 MG/GM vaginal cream Apply one pea-sized amount around the opening of the urethra daily for 2 weeks, then 3 times weekly moving forward. 42.5 g 12   famotidine (PEPCID) 20 MG tablet Take 20 mg by mouth daily as needed for heartburn or indigestion.     MAGNESIUM GLYCINATE PO Take 400 mg by mouth daily.     melatonin 5 MG TABS Take 5 mg by mouth at bedtime.     Misc Natural Products (PUMPKIN SEED OIL PO) Take 200 mg by mouth in the morning and at bedtime.     Multiple Vitamin (MULTIVITAMIN) tablet Take 1 tablet by mouth daily.     St Johns Wort 300 MG TABS Take 600 mg by mouth in the morning and at bedtime.     Vibegron (GEMTESA) 75 MG TABS Take 75 mg by mouth daily. 90 tablet 3   No current facility-administered medications on file prior to visit.    Allergies  Allergen Reactions   Adhesive [Tape] Rash    (not sure if rash was from ointment or tape)   Alpha-Gal Rash    (red meat S/P tick bite)   Neomycin-Bacitracin Zn-Polymyx Rash    Ointment   Nickel Rash     Past Medical History:  Diagnosis Date   Arthritis    left foot   Blood transfusion without reported diagnosis    Chronic neck and back pain    received physical therapy   Dysrhythmia    Pt told she had "benign arrythmia" after wearing monitor   GERD (gastroesophageal reflux disease)    with esophagitis   Motion sickness    boats, planes   Numbness    Right outer thigh, constant, notices more with standing for a long period of time   Plantar fasciitis    left   PONV (postoperative nausea and vomiting)     Past Surgical History:  Procedure Laterality Date   ARTHRODESIS METATARSAL Left 12/30/2018   Procedure: ARTHRODESIS,LISFRANC;MULTIPLE LEFT;  Surgeon: Gwyneth Revels, DPM;  Location: [facility];  Service: Podiatry;  Laterality: Left;  general with local   BLADDER SUSPENSION N/A 07/04/2015   Procedure: TRANSVAGINAL TAPE (TVT) SLING                   ;  Surgeon: Allie Bossier, MD;  Location: [facility];  Service: Gynecology;  Laterality: N/A;   COLONOSCOPY     COLONOSCOPY WITH PROPOFOL N/A 09/08/2019   Procedure: COLONOSCOPY WITH PROPOFOL;  Surgeon: Pasty Spillers, MD;  Location: [facility] ENDOSCOPY;  Service: Endoscopy;  Laterality: N/A;   COLONOSCOPY WITH PROPOFOL N/A 10/08/2022   Procedure: COLONOSCOPY WITH PROPOFOL;  Surgeon: Toney Reil, MD;  Location: [facility] END[facility]COPY;  Service: Gastroenterology;  Laterality: N/A;   CYSTOCELE REPAIR N/A 07/04/2015   Procedure: ANTERIOR REPAIR (CYSTOCELE);  Surgeon: Allie Bossier, MD;  Location: [facility];  Service: Gynecology;  Laterality: N/A;   CYSTOSCOPY N/A 07/04/2015   Procedure: CYSTOSCOPY;  Surgeon: Allie Bossier, MD;  Location: [facility];  Service: Gynecology;  Laterality: N/A;   DILATION AND CURETTAGE OF UTERUS     EXCISION MORTON'S NEUROMA Left 08/12/2018   Procedure: EXCISION MORTON'S NEUROMA;  Surgeon: Gwyneth Revels, DPM;  Location: [facility];  Service: Podiatry;  Laterality: Left;  lma local   POLYPECTOMY      POLYPECTOMY  10/08/2022   Procedure: POLYPECTOMY;  Surgeon: Toney Reil, MD;  Location: [facility] ENDOSCOPY;  Service: Gastroenterology;;   TOTAL KNEE ARTHROPLASTY Right 08/13/2021   Procedure: RIGHT TOTAL KNEE ARTHROPLASTY;  Surgeon: Tarry Kos, MD;  Location: [facility] OR;  Service: Orthopedics;  Laterality: Right;   TUBAL LIGATION  1987   UPPER GI ENDOSCOPY     VAGINAL HYSTERECTOMY N/A 07/04/2015   Procedure: TOTAL VAGINAL HYSTERECTOMY  ;  Surgeon: Allie Bossier, MD;  Location: [facility];  Service: Gynecology;  Laterality: N/A;    Family History  Problem Relation Age of Onset   Stroke Mother        x 2   Heart disease Mother        congenital arrhythmia and CHF   Depression Mother    Atrial fibrillation Mother    Stomach cancer Paternal Uncle    Prostate cancer Brother    Atrial fibrillation Brother    Colon cancer Neg Hx    Esophageal cancer Neg Hx    Kidney cancer Neg Hx    Bladder Cancer Neg Hx    Breast cancer Neg Hx     Social History   Socioeconomic History   Marital status: Married    Spouse name: Not on file   Number of children: Not on file   Years of education: Not on file   Highest education level: Master's degree (e.g., MA, MS, MEng, MEd, MSW, MBA)  Occupational History   Not on file  Tobacco Use   Smoking status: Never   Smokeless tobacco: Never  Vaping Use   Vaping status: Never Used  Substance and Sexual Activity   Alcohol use: No    Alcohol/week: 0.0 standard drinks of alcohol    Comment: rare   Drug use: No   Sexual activity: Not Currently    Birth control/protection: Post-menopausal, Surgical  Other Topics Concern   Not on file  Social History Narrative   Not on file   Social Determinants of Health   Financial Resource Strain: Low Risk  (08/11/2022)   Overall Financial Resource Strain (CARDIA)    Difficulty of Paying Living Expenses: Not hard at all  Food Insecurity: No Food Insecurity (08/11/2022)   Hunger Vital Sign    Worried About Running  Out of Food in the Last Year: Never true    Ran Out of Food in the Last Year: Never true  Transportation Needs: No Transportation Needs (08/11/2022)   PRAPARE - Administrator, Civil Service (Medical): No    Lack of Transportation (Non-Medical): No  Physical Activity: Insufficiently Active (08/11/2022)   Exercise Vital Sign    Days of Exercise per Week: 4 days  Minutes of Exercise per Session: 30 min  Stress: No Stress Concern Present (08/11/2022)   [employer] of Occupational Health - Occupational Stress Questionnaire    Feeling of Stress : Only a little  Social Connections: Socially Integrated (08/11/2022)   Social Connection and Isolation Panel [NHANES]    Frequency of Communication with Friends and Family: More than three times a week    Frequency of Social Gatherings with Friends and Family: Once a week    Attends Religious Services: 1 to 4 times per year    Active Member of [employer] or Organizations: Yes    Attends Engineer, structural: More than 4 times per year    Marital Status: Married  Catering manager Violence: Not At Risk (06/24/2022)   Humiliation, Afraid, Rape, and Kick questionnaire    Fear of Current or Ex-Partner: No    Emotionally Abused: No    Physically Abused: No    Sexually Abused: No   Review of Systems No clear loss of taste or smell No N/V Appetite is off--but able to eat    Objective:   Physical Exam Constitutional:      Appearance: Normal appearance.  HENT:     Head:     Comments: No sinus tenderness    Right Ear: Tympanic membrane and ear canal normal.     Left Ear: Tympanic membrane and ear canal normal.     Mouth/Throat:     Pharynx: No oropharyngeal exudate or posterior oropharyngeal erythema.  Pulmonary:     Effort: Pulmonary effort is normal.     Breath sounds: Normal breath sounds. No wheezing or rales.  Musculoskeletal:     Cervical back: Neck supple.  Lymphadenopathy:     Cervical: No cervical adenopathy.   Neurological:     Mental Status: She is alert.            Assessment & Plan:

## 2022-10-28 NOTE — Assessment & Plan Note (Signed)
Mild infection--did have all vaccines including the last one Discussed antivirals---will hold off unless worsens tomorrow Supportive care--mostly analgesics Isolate till 10/9, then mask (if feeling better) ER if develops SOB

## 2022-10-29 ENCOUNTER — Other Ambulatory Visit: Payer: Medicare PPO

## 2022-11-04 ENCOUNTER — Ambulatory Visit: Payer: Medicare PPO | Admitting: Family Medicine

## 2022-11-11 ENCOUNTER — Ambulatory Visit
Admission: RE | Admit: 2022-11-11 | Discharge: 2022-11-11 | Disposition: A | Discharge status: Home / Self Care | Payer: Medicare PPO | Source: Ambulatory Visit | Attending: Family Medicine | Admitting: Family Medicine

## 2022-11-11 ENCOUNTER — Encounter: Payer: Self-pay | Admitting: Family Medicine

## 2022-11-11 DIAGNOSIS — Z780 Asymptomatic menopausal state: Secondary | ICD-10-CM | POA: Diagnosis not present

## 2022-11-11 DIAGNOSIS — M85832 Other specified disorders of bone density and structure, left forearm: Secondary | ICD-10-CM | POA: Diagnosis not present

## 2022-11-11 DIAGNOSIS — M8580 Other specified disorders of bone density and structure, unspecified site: Secondary | ICD-10-CM | POA: Insufficient documentation

## 2022-11-14 ENCOUNTER — Other Ambulatory Visit: Payer: Self-pay | Admitting: Urology

## 2022-11-14 DIAGNOSIS — N3281 Overactive bladder: Secondary | ICD-10-CM

## 2022-12-03 ENCOUNTER — Ambulatory Visit (INDEPENDENT_AMBULATORY_CARE_PROVIDER_SITE_OTHER)
Admission: RE | Admit: 2022-12-03 | Discharge: 2022-12-03 | Disposition: A | Discharge status: Home / Self Care | Payer: Medicare PPO | Source: Ambulatory Visit | Attending: Family Medicine | Admitting: Family Medicine

## 2022-12-03 ENCOUNTER — Encounter: Payer: Self-pay | Admitting: Family Medicine

## 2022-12-03 ENCOUNTER — Ambulatory Visit: Payer: Medicare PPO | Admitting: Family Medicine

## 2022-12-03 VITALS — BP 120/72 | HR 79 | Temp 98.0°F | Ht 68.0 in | Wt 166.4 lb

## 2022-12-03 DIAGNOSIS — J40 Bronchitis, not specified as acute or chronic: Secondary | ICD-10-CM | POA: Insufficient documentation

## 2022-12-03 DIAGNOSIS — J984 Other disorders of lung: Secondary | ICD-10-CM | POA: Diagnosis not present

## 2022-12-03 MED ORDER — PROMETHAZINE-DM 6.25-15 MG/5ML PO SYRP: 5.0000 mL | ORAL_SOLUTION | Freq: Every evening | ORAL | 0 refills | Status: DC | PRN

## 2022-12-03 MED ORDER — ALBUTEROL SULFATE HFA 108 (90 BASE) MCG/ACT IN AERS: 2.0000 | INHALATION_SPRAY | RESPIRATORY_TRACT | 0 refills | Status: DC | PRN

## 2022-12-03 MED ORDER — BENZONATATE 200 MG PO CAPS: 200.0000 mg | ORAL_CAPSULE | Freq: Three times a day (TID) | ORAL | 1 refills | Status: DC | PRN

## 2022-12-03 MED ORDER — PREDNISONE 20 MG PO TABS: 20.0000 mg | ORAL_TABLET | Freq: Every day | ORAL | 0 refills | Status: DC

## 2022-12-03 NOTE — Assessment & Plan Note (Addendum)
5 weeks s/p mild covid/ still coughing (now worse) with chest tightness  Reassuring exam  Cxr ordered : noted hyperinflation but no infiltrate   Prednisone 20 mg for 5 d (pt does not tolerate high doses) Prometh dm for pm cough  Tessalon tid prn  Albuterol mdi prn (instructions on use printed)

## 2022-12-03 NOTE — Patient Instructions (Addendum)
Chest xray today  We will contact you with results   Take prednisone for 5 days (low dose)  It will make you hyper and hungry   Try tessalon pearles up to every 8 hours   Try the prometh DM at bedtime   Albuterol inhaler as needed for chest tightness    Update if not starting to improve in a week or if worsening

## 2022-12-03 NOTE — Progress Notes (Signed)
Subjective:    Patient ID: Shannon [name], female    DOB: 1950/08/30, 72 y.o.   MRN: [id number]  HPI  Wt Readings from Last 3 Encounters:  12/03/22 166 lb 6 oz (75.5 kg)  10/28/22 166 lb (75.3 kg)  10/08/22 164 lb 9.6 oz (74.7 kg)   25.30 kg/m  Vitals:   12/03/22 0831  BP: 120/72  Pulse: 79  Temp: 98 F (36.7 C)  SpO2: 96%    Pt presents for c/o cough  Has had persistent cough for 5 weeks since covid  Saw Dr Alphonsus Sias at that time - no anti virals  Over past 5-6 days getting worse    Congestion in chest  (not coughing up any phlegm)  Tight feeling  Not wheezing  Coughs when talking A little nasal congestion   No fever  No headache   Briefly had swollen LN in neck- better now  No ST   Little shortness of breath at times with exertion  No energy -is tired   Cxr today  Narrative & Impression  CLINICAL DATA:  Post COVID cough for 5 weeks   EXAM: CHEST - 2 VIEW   COMPARISON:  None Available.   FINDINGS: Normal mediastinum and cardiac silhouette. Lungs are hyperinflated. Normal pulmonary vasculature. No evidence of effusion, infiltrate, or pneumothorax. No acute bony abnormality.   IMPRESSION: Hyperinflated lungs.  No acute findings    Patient Active Problem List   Diagnosis Date Noted   Bronchitis 12/03/2022   Osteopenia 11/11/2022   COVID-19 virus infection 10/28/2022   Rectal polyp 10/08/2022   History of colonic polyps 10/08/2022   Hearing loss due to cerumen impaction, left 08/12/2022   Lipid screening 06/13/2022   Status post total right knee replacement 08/13/2021   Pre-operative cardiovascular examination 06/10/2021   Pre-op examination 05/31/2021   Primary osteoarthritis of right knee 03/28/2021   Polyp of colon    History of PSVT (paroxysmal supraventricular tachycardia) 06/17/2017   Recurrent UTI 08/23/2016   Estrogen deficiency 12/06/2015   Prediabetes 12/06/2015   Incontinence of urine in female 04/12/2015   Medicare annual  wellness visit, subsequent 11/30/2014   Routine general medical examination at a health care facility 11/05/2012   PLANTAR FASCIITIS, LEFT 07/28/2009   History of colonic polyps 11/02/2007   GERD 10/20/2006   Past Medical History:  Diagnosis Date   Arthritis    left foot   Blood transfusion without reported diagnosis    Chronic neck and back pain    received physical therapy   Dysrhythmia    Pt told she had "benign arrythmia" after wearing monitor   GERD (gastroesophageal reflux disease)    with esophagitis   Motion sickness    boats, planes   Numbness    Right outer thigh, constant, notices more with standing for a long period of time   Plantar fasciitis    left   PONV (postoperative nausea and vomiting)    Past Surgical History:  Procedure Laterality Date   ARTHRODESIS METATARSAL Left 12/30/2018   Procedure: ARTHRODESIS,LISFRANC;MULTIPLE LEFT;  Surgeon: Gwyneth Revels, DPM;  Location: [facility];  Service: Podiatry;  Laterality: Left;  general with local   BLADDER SUSPENSION N/A 07/04/2015   Procedure: TRANSVAGINAL TAPE (TVT) SLING                   ;  Surgeon: Allie Bossier, MD;  Location: [facility];  Service: Gynecology;  Laterality: N/A;   COLONOSCOPY     COLONOSCOPY  WITH PROPOFOL N/A 09/08/2019   Procedure: COLONOSCOPY WITH PROPOFOL;  Surgeon: Pasty Spillers, MD;  Location: [facility];  Service: Endoscopy;  Laterality: N/A;   COLONOSCOPY WITH PROPOFOL N/A 10/08/2022   Procedure: COLONOSCOPY WITH PROPOFOL;  Surgeon: Toney Reil, MD;  Location: [facility] END[facility];  Service: Gastroenterology;  Laterality: N/A;   CYSTOCELE REPAIR N/A 07/04/2015   Procedure: ANTERIOR REPAIR (CYSTOCELE);  Surgeon: Allie Bossier, MD;  Location: [facility];  Service: Gynecology;  Laterality: N/A;   CYSTOSCOPY N/A 07/04/2015   Procedure: CYSTOSCOPY;  Surgeon: Allie Bossier, MD;  Location: [facility];  Service: Gynecology;  Laterality: N/A;   DILATION AND CURETTAGE OF UTERUS     EXCISION MORTON'S  NEUROMA Left 08/12/2018   Procedure: EXCISION MORTON'S NEUROMA;  Surgeon: Gwyneth Revels, DPM;  Location: [facility];  Service: Podiatry;  Laterality: Left;  lma local   POLYPECTOMY     POLYPECTOMY  10/08/2022   Procedure: POLYPECTOMY;  Surgeon: Toney Reil, MD;  Location: [facility];  Service: Gastroenterology;;   TOTAL KNEE ARTHROPLASTY Right 08/13/2021   Procedure: RIGHT TOTAL KNEE ARTHROPLASTY;  Surgeon: Tarry Kos, MD;  Location: [facility];  Service: Orthopedics;  Laterality: Right;   TUBAL LIGATION  1987   UPPER GI ENDOSCOPY     VAGINAL HYSTERECTOMY N/A 07/04/2015   Procedure: TOTAL VAGINAL HYSTERECTOMY  ;  Surgeon: Allie Bossier, MD;  Location: [facility];  Service: Gynecology;  Laterality: N/A;   Social History   Tobacco Use   Smoking status: Never   Smokeless tobacco: Never  Vaping Use   Vaping status: Never Used  Substance Use Topics   Alcohol use: No    Alcohol/week: 0.0 standard drinks of alcohol    Comment: rare   Drug use: No   Family History  Problem Relation Age of Onset   Stroke Mother        x 2   Heart disease Mother        congenital arrhythmia and CHF   Depression Mother    Atrial fibrillation Mother    Stomach cancer Paternal Uncle    Prostate cancer Brother    Atrial fibrillation Brother    Colon cancer Neg Hx    Esophageal cancer Neg Hx    Kidney cancer Neg Hx    Bladder Cancer Neg Hx    Breast cancer Neg Hx    Allergies  Allergen Reactions   Adhesive [Tape] Rash    (not sure if rash was from ointment or tape)   Alpha-Gal Rash    (red meat S/P tick bite)   Neomycin-Bacitracin Zn-Polymyx Rash    Ointment   Nickel Rash   Current Outpatient Medications on File Prior to Visit  Medication Sig Dispense Refill   ASHWAGANDHA PO Take 2 capsules by mouth daily.     Calcium Carbonate-Simethicone (PHAZYME GAS & ACID MAX ST PO) Take 1 tablet by mouth as needed (bloating).     CALCIUM CITRATE PO Take 500 mg by mouth daily.      cholecalciferol (VITAMIN D3) 25 MCG (1000 UNIT) tablet Take 1,000 Units by mouth daily.     diclofenac Sodium (VOLTAREN) 1 % GEL Apply 1 Application topically 4 (four) times daily as needed (pain).     estradiol (ESTRACE) 0.1 MG/GM vaginal cream Apply one pea-sized amount around the opening of the urethra daily for 2 weeks, then 3 times weekly moving forward. 42.5 g 12   famotidine (PEPCID) 20 MG tablet Take 20 mg by  mouth daily as needed for heartburn or indigestion.     GEMTESA 75 MG TABS TAKE 1 TABLET BY MOUTH DAILY 90 tablet 3   L-THEANINE PO Take 2 capsules by mouth daily.     MAGNESIUM GLYCINATE PO Take 400 mg by mouth daily.     melatonin 5 MG TABS Take 5 mg by mouth at bedtime.     Misc Natural Products (PUMPKIN SEED OIL PO) Take 200 mg by mouth in the morning and at bedtime.     Multiple Vitamin (MULTIVITAMIN) tablet Take 1 tablet by mouth daily.     St Johns Wort 300 MG TABS Take 600 mg by mouth in the morning and at bedtime.     No current facility-administered medications on file prior to visit.    Review of Systems  Constitutional:  Negative for activity change, appetite change, fatigue, fever and unexpected weight change.  HENT:  Positive for congestion and postnasal drip. Negative for ear pain, rhinorrhea, sinus pressure and sore throat.        Congestion is mild   Eyes:  Negative for pain, redness and visual disturbance.  Respiratory:  Positive for cough, chest tightness and shortness of breath. Negative for wheezing and stridor.   Cardiovascular:  Negative for chest pain and palpitations.  Gastrointestinal:  Negative for abdominal pain, blood in stool, constipation and diarrhea.  Endocrine: Negative for polydipsia and polyuria.  Genitourinary:  Negative for dysuria, frequency and urgency.  Musculoskeletal:  Negative for arthralgias, back pain and myalgias.  Skin:  Negative for pallor and rash.  Allergic/Immunologic: Negative for environmental allergies.  Neurological:   Negative for dizziness, syncope and headaches.  Hematological:  Negative for adenopathy. Does not bruise/bleed easily.  Psychiatric/Behavioral:  Negative for decreased concentration and dysphoric mood. The patient is not nervous/anxious.        Objective:   Physical Exam Constitutional:      General: She is not in acute distress.    Appearance: Normal appearance. She is well-developed and normal weight. She is not ill-appearing or diaphoretic.  HENT:     Head: Normocephalic and atraumatic.     Right Ear: Tympanic membrane and ear canal normal.     Left Ear: Ear canal normal.     Nose:     Comments: Boggy nares     Mouth/Throat:     Mouth: Mucous membranes are moist.     Pharynx: No oropharyngeal exudate or posterior oropharyngeal erythema.  Eyes:     General:        Right eye: No discharge.        Left eye: No discharge.     Conjunctiva/sclera: Conjunctivae normal.     Pupils: Pupils are equal, round, and reactive to light.  Neck:     Thyroid: No thyromegaly.     Vascular: No carotid bruit or JVD.  Cardiovascular:     Rate and Rhythm: Normal rate and regular rhythm.     Heart sounds: Normal heart sounds.     No gallop.  Pulmonary:     Effort: Pulmonary effort is normal. No respiratory distress.     Breath sounds: No stridor. Rhonchi present. No wheezing or rales.     Comments: Scattered rhonchi No rales   Scant wheeze on forced exp  Abdominal:     General: There is no distension or abdominal bruit.     Palpations: Abdomen is soft.  Musculoskeletal:     Cervical back: Normal range of motion and neck supple.  Right lower leg: No edema.     Left lower leg: No edema.  Lymphadenopathy:     Cervical: No cervical adenopathy.  Skin:    General: Skin is warm and dry.     Coloration: Skin is not pale.     Findings: No rash.  Neurological:     Mental Status: She is alert.     Coordination: Coordination normal.     Deep Tendon Reflexes: Reflexes are normal and  symmetric. Reflexes normal.  Psychiatric:        Mood and Affect: Mood normal.           Assessment & Plan:   Problem List Items Addressed This Visit       Respiratory   Bronchitis - Primary    5 weeks s/p mild covid/ still coughing (now worse) with chest tightness  Reassuring exam  Cxr ordered : noted hyperinflation but no infiltrate   Prednisone 20 mg for 5 d (pt does not tolerate high doses) Prometh dm for pm cough  Tessalon tid prn  Albuterol mdi prn (instructions on use printed)         Relevant Orders   DG Chest 2 View (Completed)

## 2022-12-26 DIAGNOSIS — H43813 Vitreous degeneration, bilateral: Secondary | ICD-10-CM | POA: Diagnosis not present

## 2023-03-31 ENCOUNTER — Telehealth: Payer: Self-pay | Admitting: Orthopaedic Surgery

## 2023-03-31 ENCOUNTER — Other Ambulatory Visit: Payer: Self-pay

## 2023-03-31 MED ORDER — AMOXICILLIN 500 MG PO TABS: ORAL_TABLET | ORAL | 2 refills | Status: DC

## 2023-03-31 NOTE — Telephone Encounter (Signed)
 Antibiotics sent and patient notified.

## 2023-03-31 NOTE — Telephone Encounter (Signed)
 Patient called. She is having some dental work done and needs antibiotics.

## 2023-04-14 NOTE — Progress Notes (Unsigned)
 04/15/2023 1:42 PM   Shannon [name] 1950/07/12 [id number]  Referring provider: Judy Pimple, MD [address] [place],  [place] [address]  Urological history: 1. rUTI's -contributing factors of age, incontinence, constipation and vaginal atrophy -documented urine cultures over the last year  October 12, 2022, E. coli, vancomycin-resistant Enterococcus faecalis  September 26, 2022, E. Coli -managed with increased fluid intake, good perineal hygiene, avoiding tub baths and vaginal moisturizer   2. OAB/Urgency -cysto (2020) NED -Myrbetriq 50 mg daily and pumpkin seed oil   3. Incontinence -TVT Sling by gynecologist in 06/2015 during hysterectomy with anterior repair  -managed with pessary    No chief complaint on file.   HPI: Shannon [name] is a 73 y.o. female who presents today for yearly follow up.    Previous records reviewed.     She has had two UTI since last year.     PVR ***  PMH: Past Medical History:  Diagnosis Date   Arthritis    left foot   Blood transfusion without reported diagnosis    Chronic neck and back pain    received physical therapy   Dysrhythmia    Pt told she had "benign arrythmia" after wearing monitor   GERD (gastroesophageal reflux disease)    with esophagitis   Motion sickness    boats, planes   Numbness    Right outer thigh, constant, notices more with standing for a long period of time   Plantar fasciitis    left   PONV (postoperative nausea and vomiting)     Surgical History: Past Surgical History:  Procedure Laterality Date   ARTHRODESIS METATARSAL Left 12/30/2018   Procedure: ARTHRODESIS,LISFRANC;MULTIPLE LEFT;  Surgeon: Gwyneth Revels, DPM;  Location: [facility];  Service: Podiatry;  Laterality: Left;  general with local   BLADDER SUSPENSION N/A 07/04/2015   Procedure: TRANSVAGINAL TAPE (TVT) SLING                   ;  Surgeon: Allie Bossier, MD;  Location: [facility];  Service: Gynecology;   Laterality: N/A;   COLONOSCOPY     COLONOSCOPY WITH PROPOFOL N/A 09/08/2019   Procedure: COLONOSCOPY WITH PROPOFOL;  Surgeon: Pasty Spillers, MD;  Location: [facility] END[facility]COPY;  Service: Endoscopy;  Laterality: N/A;   COLONOSCOPY WITH PROPOFOL N/A 10/08/2022   Procedure: COLONOSCOPY WITH PROPOFOL;  Surgeon: Toney Reil, MD;  Location: [facility];  Service: Gastroenterology;  Laterality: N/A;   CYSTOCELE REPAIR N/A 07/04/2015   Procedure: ANTERIOR REPAIR (CYSTOCELE);  Surgeon: Allie Bossier, MD;  Location: [facility];  Service: Gynecology;  Laterality: N/A;   CYSTOSCOPY N/A 07/04/2015   Procedure: CYSTOSCOPY;  Surgeon: Allie Bossier, MD;  Location: [facility];  Service: Gynecology;  Laterality: N/A;   DILATION AND CURETTAGE OF UTERUS     EXCISION MORTON'S NEUROMA Left 08/12/2018   Procedure: EXCISION MORTON'S NEUROMA;  Surgeon: Gwyneth Revels, DPM;  Location: [facility];  Service: Podiatry;  Laterality: Left;  lma local   POLYPECTOMY     POLYPECTOMY  10/08/2022   Procedure: POLYPECTOMY;  Surgeon: Toney Reil, MD;  Location: [facility] END[facility]COPY;  Service: Gastroenterology;;   TOTAL KNEE ARTHROPLASTY Right 08/13/2021   Procedure: RIGHT TOTAL KNEE ARTHROPLASTY;  Surgeon: Tarry Kos, MD;  Location: [facility];  Service: Orthopedics;  Laterality: Right;   TUBAL LIGATION  1987   UPPER GI ENDOSCOPY     VAGINAL HYSTERECTOMY N/A 07/04/2015   Procedure: TOTAL VAGINAL  HYSTERECTOMY  ;  Surgeon: Allie Bossier, MD;  Location: [facility];  Service: Gynecology;  Laterality: N/A;    Home Medications:  Allergies as of 04/15/2023       Reactions   Adhesive [tape] Rash   (not sure if rash was from ointment or tape)   Alpha-gal Rash   (red meat S/P tick bite)   Neomycin-bacitracin Zn-polymyx Rash   Ointment   Nickel Rash        Medication List        Accurate as of April 14, 2023  1:42 PM. If you have any questions, ask your nurse or doctor.          albuterol 108 (90 Base) MCG/ACT  inhaler Commonly known as: VENTOLIN HFA Inhale 2 puffs into the lungs every 4 (four) hours as needed for wheezing or shortness of breath (tight chest).   amoxicillin 500 MG tablet Commonly known as: AMOXIL Take 4 tablets by mouth 1 hour prior to dental procedure.   ASHWAGANDHA PO Take 2 capsules by mouth daily.   benzonatate 200 MG capsule Commonly known as: TESSALON Take 1 capsule (200 mg total) by mouth 3 (three) times daily as needed.   CALCIUM CITRATE PO Take 500 mg by mouth daily.   cholecalciferol 25 MCG (1000 UNIT) tablet Commonly known as: VITAMIN D3 Take 1,000 Units by mouth daily.   diclofenac Sodium 1 % Gel Commonly known as: VOLTAREN Apply 1 Application topically 4 (four) times daily as needed (pain).   estradiol 0.1 MG/GM vaginal cream Commonly known as: ESTRACE Apply one pea-sized amount around the opening of the urethra daily for 2 weeks, then 3 times weekly moving forward.   famotidine 20 MG tablet Commonly known as: PEPCID Take 20 mg by mouth daily as needed for heartburn or indigestion.   Gemtesa 75 MG Tabs Generic drug: Vibegron TAKE 1 TABLET BY MOUTH DAILY   L-THEANINE PO Take 2 capsules by mouth daily.   MAGNESIUM GLYCINATE PO Take 400 mg by mouth daily.   melatonin 5 MG Tabs Take 5 mg by mouth at bedtime.   multivitamin tablet Take 1 tablet by mouth daily.   PHAZYME GAS & ACID MAX ST PO Take 1 tablet by mouth as needed (bloating).   predniSONE 20 MG tablet Commonly known as: DELTASONE Take 1 tablet (20 mg total) by mouth daily with breakfast.   promethazine-dextromethorphan 6.25-15 MG/5ML syrup Commonly known as: PROMETHAZINE-DM Take 5 mLs by mouth at bedtime as needed for cough.   PUMPKIN SEED OIL PO Take 200 mg by mouth in the morning and at bedtime.   St Johns Wort 300 MG Tabs Take 600 mg by mouth in the morning and at bedtime.        Allergies:  Allergies  Allergen Reactions   Adhesive [Tape] Rash    (not sure if  rash was from ointment or tape)   Alpha-Gal Rash    (red meat S/P tick bite)   Neomycin-Bacitracin Zn-Polymyx Rash    Ointment   Nickel Rash    Family History: Family History  Problem Relation Age of Onset   Stroke Mother        x 2   Heart disease Mother        congenital arrhythmia and CHF   Depression Mother    Atrial fibrillation Mother    Stomach cancer Paternal Uncle    Prostate cancer Brother    Atrial fibrillation Brother    Colon cancer Neg Hx  Esophageal cancer Neg Hx    Kidney cancer Neg Hx    Bladder Cancer Neg Hx    Breast cancer Neg Hx     Social History:  reports that she has never smoked. She has never used smokeless tobacco. She reports that she does not drink alcohol and does not use drugs.  ROS: Pertinent ROS in HPI  Physical Exam: There were no vitals taken for this visit.  Constitutional:  Well nourished. Alert and oriented, No acute distress. HEENT: [place] AT, moist mucus membranes.  Trachea midline, no masses. Cardiovascular: No clubbing, cyanosis, or edema. Respiratory: Normal respiratory effort, no increased work of breathing. GU: No CVA tenderness.  No bladder fullness or masses.  Recession of labia minora, dry, pale vulvar vaginal mucosa and loss of mucosal ridges and folds.  Normal urethral meatus, no lesions, no prolapse, no discharge.   No urethral masses, tenderness and/or tenderness. No bladder fullness, tenderness or masses. *** vagina mucosa, *** estrogen effect, no discharge, no lesions, *** pelvic support, *** cystocele and *** rectocele noted.  No cervical motion tenderness.  Uterus is freely mobile and non-fixed.  No adnexal/parametria masses or tenderness noted.  Anus and perineum are without rashes or lesions.   ***  Neurologic: Grossly intact, no focal deficits, moving all 4 extremities. Psychiatric: Normal mood and affect.    Laboratory Data: Urinalysis See EPIC and HPI I have reviewed the labs.   Pertinent  Imaging: N/A   Assessment & Plan:    1.  Microscopic hematuria -UA ***  2. Vaginal atrophy -continue to apply the cream three nights weekly  3. rUTI's -Continue the vaginal estrogen cream  No follow-ups on file.  These notes generated with voice recognition software. I apologize for typographical errors.  Cloretta Ned  [facility] [address]  [address] [place], [place] [address] [phone]

## 2023-04-15 ENCOUNTER — Ambulatory Visit: Payer: Self-pay | Admitting: Urology

## 2023-04-15 ENCOUNTER — Encounter: Payer: Self-pay | Admitting: Urology

## 2023-04-15 ENCOUNTER — Telehealth: Payer: Self-pay

## 2023-04-15 VITALS — BP 112/75 | HR 65 | Ht 68.5 in | Wt 162.0 lb

## 2023-04-15 DIAGNOSIS — N3281 Overactive bladder: Secondary | ICD-10-CM

## 2023-04-15 DIAGNOSIS — N390 Urinary tract infection, site not specified: Secondary | ICD-10-CM | POA: Diagnosis not present

## 2023-04-15 DIAGNOSIS — N952 Postmenopausal atrophic vaginitis: Secondary | ICD-10-CM | POA: Diagnosis not present

## 2023-04-15 DIAGNOSIS — R3129 Other microscopic hematuria: Secondary | ICD-10-CM

## 2023-04-15 LAB — URINALYSIS, COMPLETE
Bilirubin, UA: NEGATIVE
Glucose, UA: NEGATIVE
Ketones, UA: NEGATIVE
Leukocytes,UA: NEGATIVE
Nitrite, UA: NEGATIVE
Protein,UA: NEGATIVE
RBC, UA: NEGATIVE
Specific Gravity, UA: >1.03 — ABNORMAL HIGH (ref 1.005–1.030)
Urobilinogen, Ur: 0.2 mg/dL (ref 0.2–1.0)
pH, UA: 6 (ref 5.0–7.5)

## 2023-04-15 LAB — MICROSCOPIC EXAMINATION: Epithelial Cells (non renal): >10 /HPF — AB (ref 0–10)

## 2023-04-15 LAB — BLADDER SCAN AMB NON-IMAGING

## 2023-04-15 NOTE — Telephone Encounter (Signed)
Patient advised and appointments made

## 2023-04-15 NOTE — Telephone Encounter (Signed)
 Service Posterior Tibial Nerve Stimulation  Procedure code 95188  Submission date 04/15/2023 2:47 PM  Dates of service 05/12/2023 -- 08/21/2023  Approved Authorization #[id number]  Tracking #[id number]

## 2023-04-15 NOTE — Telephone Encounter (Signed)
 Co pay will be $35 each visit x 12 weeks, per [facility] rep this is an estimate and once claims are received patient's responsibility cost may change. Patient has no deductible to meet, her max OOP is #[phone] and patient has not used any of that amount towards her health bills yet.

## 2023-04-15 NOTE — Telephone Encounter (Signed)
-----   Message from [facility] sent at 04/15/2023 10:08 AM EDT ----- Regarding: PTNS PA Would you see if her insurance will cover PTNS?  She would also like to know what her cost would be.  Co-pay, deductible, etc.

## 2023-04-16 ENCOUNTER — Ambulatory Visit: Payer: Medicare PPO | Admitting: Urology

## 2023-04-17 ENCOUNTER — Ambulatory Visit: Payer: Medicare PPO | Admitting: Urology

## 2023-04-21 ENCOUNTER — Other Ambulatory Visit: Payer: Self-pay | Admitting: Family Medicine

## 2023-04-21 DIAGNOSIS — Z1231 Encounter for screening mammogram for malignant neoplasm of breast: Secondary | ICD-10-CM

## 2023-05-05 ENCOUNTER — Ambulatory Visit: Admitting: Internal Medicine

## 2023-05-05 ENCOUNTER — Encounter: Payer: Self-pay | Admitting: Internal Medicine

## 2023-05-05 VITALS — BP 112/70 | HR 68 | Temp 98.9°F | Ht 68.0 in | Wt 171.0 lb

## 2023-05-05 DIAGNOSIS — R3915 Urgency of urination: Secondary | ICD-10-CM | POA: Diagnosis not present

## 2023-05-05 DIAGNOSIS — N3090 Cystitis, unspecified without hematuria: Secondary | ICD-10-CM | POA: Diagnosis not present

## 2023-05-05 LAB — POC URINALSYSI DIPSTICK (AUTOMATED)
Bilirubin, UA: NEGATIVE
Glucose, UA: NEGATIVE
Ketones, UA: NEGATIVE
Nitrite, UA: NEGATIVE
Protein, UA: POSITIVE — AB
Spec Grav, UA: >=1.03 — AB (ref 1.010–1.025)
Urobilinogen, UA: 0.2 U/dL
pH, UA: 5.5 (ref 5.0–8.0)

## 2023-05-05 MED ORDER — SULFAMETHOXAZOLE-TRIMETHOPRIM 800-160 MG PO TABS: 1.0000 | ORAL_TABLET | Freq: Two times a day (BID) | ORAL | 1 refills | Status: DC

## 2023-05-05 NOTE — Progress Notes (Signed)
 Subjective:    Patient ID: Shannon [name], female    DOB: 04-27-1950, 73 y.o.   MRN: [id number]  HPI Here due to urinary symptoms  Last symptoms were last September Using cranberry, vaginal estrogen Stays well hydrated  Symptoms started yesterday Urinary frequency--with urgency (and sometimes nothing there) Some burning dysuria No blood  No other Rx--just increased fluids  Current Outpatient Medications on File Prior to Visit  Medication Sig Dispense Refill   ASHWAGANDHA PO Take 2 capsules by mouth daily.     Calcium Carbonate-Simethicone (PHAZYME GAS & ACID MAX ST PO) Take 1 tablet by mouth as needed (bloating).     CALCIUM CITRATE PO Take 500 mg by mouth daily.     cholecalciferol (VITAMIN D3) 25 MCG (1000 UNIT) tablet Take 1,000 Units by mouth daily.     diclofenac Sodium (VOLTAREN) 1 % GEL Apply 1 Application topically 4 (four) times daily as needed (pain).     estradiol (ESTRACE) 0.1 MG/GM vaginal cream Apply one pea-sized amount around the opening of the urethra daily for 2 weeks, then 3 times weekly moving forward. 42.5 g 12   famotidine (PEPCID) 20 MG tablet Take 20 mg by mouth daily as needed for heartburn or indigestion.     L-THEANINE PO Take 2 capsules by mouth daily.     MAGNESIUM GLYCINATE PO Take 400 mg by mouth daily.     melatonin 5 MG TABS Take 5 mg by mouth at bedtime.     Misc Natural Products (PUMPKIN SEED OIL PO) Take 200 mg by mouth in the morning and at bedtime.     Multiple Vitamin (MULTIVITAMIN) tablet Take 1 tablet by mouth daily.     St Johns Wort 300 MG TABS Take 600 mg by mouth in the morning and at bedtime.     No current facility-administered medications on file prior to visit.    Allergies  Allergen Reactions   Adhesive [Tape] Rash    (not sure if rash was from ointment or tape)   Alpha-Gal Rash    (red meat S/P tick bite)   Neomycin-Bacitracin Zn-Polymyx Rash    Ointment   Nickel Rash    Past Medical History:  Diagnosis Date    Arthritis    left foot   Blood transfusion without reported diagnosis    Chronic neck and back pain    received physical therapy   Dysrhythmia    Pt told she had "benign arrythmia" after wearing monitor   GERD (gastroesophageal reflux disease)    with esophagitis   Motion sickness    boats, planes   Numbness    Right outer thigh, constant, notices more with standing for a long period of time   Plantar fasciitis    left   PONV (postoperative nausea and vomiting)     Past Surgical History:  Procedure Laterality Date   ARTHRODESIS METATARSAL Left 12/30/2018   Procedure: ARTHRODESIS,LISFRANC;MULTIPLE LEFT;  Surgeon: Gwyneth Revels, DPM;  Location: [facility];  Service: Podiatry;  Laterality: Left;  general with local   BLADDER SUSPENSION N/A 07/04/2015   Procedure: TRANSVAGINAL TAPE (TVT) SLING                   ;  Surgeon: Allie Bossier, MD;  Location: [facility];  Service: Gynecology;  Laterality: N/A;   COLONOSCOPY     COLONOSCOPY WITH PROPOFOL N/A 09/08/2019   Procedure: COLONOSCOPY WITH PROPOFOL;  Surgeon: Pasty Spillers, MD;  Location: [facility];  Service: Endoscopy;  Laterality: N/A;   COLONOSCOPY WITH PROPOFOL N/A 10/08/2022   Procedure: COLONOSCOPY WITH PROPOFOL;  Surgeon: Selena Daily, MD;  Location: [facility];  Service: Gastroenterology;  Laterality: N/A;   CYSTOCELE REPAIR N/A 07/04/2015   Procedure: ANTERIOR REPAIR (CYSTOCELE);  Surgeon: Ana Balling, MD;  Location: [facility];  Service: Gynecology;  Laterality: N/A;   CYSTOSCOPY N/A 07/04/2015   Procedure: CYSTOSCOPY;  Surgeon: Ana Balling, MD;  Location: [facility];  Service: Gynecology;  Laterality: N/A;   DILATION AND CURETTAGE OF UTERUS     EXCISION MORTON'S NEUROMA Left 08/12/2018   Procedure: EXCISION MORTON'S NEUROMA;  Surgeon: Anell Baptist, DPM;  Location: [facility];  Service: Podiatry;  Laterality: Left;  lma local   POLYPECTOMY     POLYPECTOMY  10/08/2022   Procedure: POLYPECTOMY;   Surgeon: Selena Daily, MD;  Location: [facility];  Service: Gastroenterology;;   TOTAL KNEE ARTHROPLASTY Right 08/13/2021   Procedure: RIGHT TOTAL KNEE ARTHROPLASTY;  Surgeon: Wes Hamman, MD;  Location: [facility];  Service: Orthopedics;  Laterality: Right;   TUBAL LIGATION  1987   UPPER GI ENDOSCOPY     VAGINAL HYSTERECTOMY N/A 07/04/2015   Procedure: TOTAL VAGINAL HYSTERECTOMY  ;  Surgeon: Ana Balling, MD;  Location: [facility];  Service: Gynecology;  Laterality: N/A;    Family History  Problem Relation Age of Onset   Stroke Mother        x 2   Heart disease Mother        congenital arrhythmia and CHF   Depression Mother    Atrial fibrillation Mother    Stomach cancer Paternal Uncle    Prostate cancer Brother    Atrial fibrillation Brother    Colon cancer Neg Hx    Esophageal cancer Neg Hx    Kidney cancer Neg Hx    Bladder Cancer Neg Hx    Breast cancer Neg Hx     Social History   Socioeconomic History   Marital status: Married    Spouse name: Not on file   Number of children: Not on file   Years of education: Not on file   Highest education level: Master's degree (e.g., MA, MS, MEng, MEd, MSW, MBA)  Occupational History   Not on file  Tobacco Use   Smoking status: Never   Smokeless tobacco: Never  Vaping Use   Vaping status: Never Used  Substance and Sexual Activity   Alcohol use: No    Alcohol/week: 0.0 standard drinks of alcohol    Comment: rare   Drug use: No   Sexual activity: Not Currently    Birth control/protection: Post-menopausal, Surgical  Other Topics Concern   Not on file  Social History Narrative   Not on file   Social Drivers of Health   Financial Resource Strain: Low Risk  (08/11/2022)   Overall Financial Resource Strain (CARDIA)    Difficulty of Paying Living Expenses: Not hard at all  Food Insecurity: No Food Insecurity (08/11/2022)   Hunger Vital Sign    Worried About Running Out of Food in the Last Year: Never true    Ran Out of  Food in the Last Year: Never true  Transportation Needs: No Transportation Needs (08/11/2022)   PRAPARE - Administrator, Civil Service (Medical): No    Lack of Transportation (Non-Medical): No  Physical Activity: Insufficiently Active (08/11/2022)   Exercise Vital Sign    Days of Exercise per Week: 4 days  Minutes of Exercise per Session: 30 min  Stress: No Stress Concern Present (08/11/2022)   [employer] of Occupational Health - Occupational Stress Questionnaire    Feeling of Stress : Only a little  Social Connections: Socially Integrated (08/11/2022)   Social Connection and Isolation Panel [NHANES]    Frequency of Communication with Friends and Family: More than three times a week    Frequency of Social Gatherings with Friends and Family: Once a week    Attends Religious Services: 1 to 4 times per year    Active Member of [employer] or Organizations: Yes    Attends Engineer, structural: More than 4 times per year    Marital Status: Married  Catering manager Violence: Not At Risk (06/24/2022)   Humiliation, Afraid, Rape, and Kick questionnaire    Fear of Current or Ex-Partner: No    Emotionally Abused: No    Physically Abused: No    Sexually Abused: No   Review of Systems No abdominal pain No N/V    Objective:   Physical Exam         Assessment & Plan:

## 2023-05-05 NOTE — Assessment & Plan Note (Addendum)
 Past E coli and pan sensitive Urinalysis shows 1+ leuks and blood May not have done as well with cephalexin--especially just 3 days--the last time Will give septra DS bid x 7 days (with refill ) Will send culture

## 2023-05-07 ENCOUNTER — Encounter: Payer: Self-pay | Admitting: Internal Medicine

## 2023-05-07 LAB — URINE CULTURE
MICRO NUMBER:: 16325996
SPECIMEN QUALITY:: ADEQUATE

## 2023-05-21 ENCOUNTER — Ambulatory Visit: Admitting: Urology

## 2023-05-21 VITALS — Wt 171.0 lb

## 2023-05-21 DIAGNOSIS — N3281 Overactive bladder: Secondary | ICD-10-CM

## 2023-05-21 NOTE — Progress Notes (Signed)
 PTNS  Session # 1/12  Health & Social Factors: No changes. Caffeine: 0 Alcohol: 0 Daytime voids #per day: 10 Night-time voids #per night: 3 Urgency: strong Incontinence Episodes #per day: 2 Ankle used: Left Treatment Setting: 6 Feeling/ Response: Sensory  Comments: Patient tolerated procedure.  Performed By: Matilde Son, PA-C   Follow Up: 1 week for number 2 out of 12 PTNS

## 2023-05-21 NOTE — Patient Instructions (Signed)

## 2023-05-28 ENCOUNTER — Encounter: Payer: Self-pay | Admitting: Urology

## 2023-05-28 ENCOUNTER — Ambulatory Visit: Admitting: Urology

## 2023-05-28 VITALS — BP 127/74 | HR 73 | Ht 68.0 in | Wt 171.0 lb

## 2023-05-28 DIAGNOSIS — N3281 Overactive bladder: Secondary | ICD-10-CM | POA: Diagnosis not present

## 2023-05-28 NOTE — Progress Notes (Signed)
 PTNS  Session # 2/12  Health & Social Factors: No changes  Caffeine: 0 Alcohol: 0 Daytime voids #per day: 13 Night-time voids #per night: 4 Urgency: Strong Incontinence Episodes #per day: 2 Ankle used: Left  Treatment Setting: 7 Feeling/ Response: Sensory  Comments: Patient tolerated  Performed By: Matilde Son, PA-C   Follow Up: 1 week for number 3 out of 12 PTNS

## 2023-05-28 NOTE — Patient Instructions (Signed)

## 2023-06-02 ENCOUNTER — Ambulatory Visit
Admission: RE | Admit: 2023-06-02 | Discharge: 2023-06-02 | Disposition: A | Discharge status: Home / Self Care | Source: Ambulatory Visit | Attending: Family Medicine | Admitting: Family Medicine

## 2023-06-02 DIAGNOSIS — Z1231 Encounter for screening mammogram for malignant neoplasm of breast: Secondary | ICD-10-CM | POA: Diagnosis not present

## 2023-06-04 ENCOUNTER — Encounter: Payer: Self-pay | Admitting: Urology

## 2023-06-04 ENCOUNTER — Ambulatory Visit: Payer: Self-pay | Admitting: Family Medicine

## 2023-06-04 ENCOUNTER — Ambulatory Visit: Admitting: Urology

## 2023-06-04 VITALS — BP 120/69 | HR 81 | Ht 68.0 in | Wt 171.0 lb

## 2023-06-04 DIAGNOSIS — N3281 Overactive bladder: Secondary | ICD-10-CM

## 2023-06-04 NOTE — Progress Notes (Signed)
 PTNS  Session # 3/12  Health & Social Factors: No change Caffeine: 0 Alcohol: 0 Daytime voids #per day: 13 Night-time voids #per night: 4 Urgency: strong Incontinence Episodes #per day: 2 Ankle used: left Treatment Setting: 11 Feeling/ Response: Sensory  Comments: Patient tolerated  Performed By: Matilde Son, PA-C   Follow Up: 1 week for number 4 out of 12 PTNS

## 2023-06-11 ENCOUNTER — Ambulatory Visit: Admitting: Urology

## 2023-06-11 DIAGNOSIS — N3281 Overactive bladder: Secondary | ICD-10-CM | POA: Diagnosis not present

## 2023-06-11 NOTE — Progress Notes (Signed)
 PTNS  Session # 4  Health & Social Factors: no change Caffeine: None Alcohol: None Daytime voids #per day: 9-12 Night-time voids #per night: 2-3 Urgency: mild Incontinence Episodes #per day: 1-3 Ankle used: left Treatment Setting: 5 Feeling/ Response: Sensory  Comments: Patient tolerated.   Performed By: Matilde Son, PA-C   Follow Up: One week for # 5/12 PTNS

## 2023-06-18 ENCOUNTER — Ambulatory Visit: Admitting: Urology

## 2023-06-18 DIAGNOSIS — N3281 Overactive bladder: Secondary | ICD-10-CM | POA: Diagnosis not present

## 2023-06-18 NOTE — Progress Notes (Signed)
 PTNS  Session # 5/12  Health & Social Factors: No change Caffeine: 0 Alcohol: 0 Daytime voids #per day: 13 Night-time voids #per night: 4 Urgency: Strong Incontinence Episodes #per day: 2 Ankle used: Right Treatment Setting: 1 Feeling/ Response: Sensory  Comments: Patient tolerated   Performed By: Matilde Son, PA-C   Follow Up: 1 week for number 6 out of 12 PTNS

## 2023-06-25 ENCOUNTER — Ambulatory Visit (INDEPENDENT_AMBULATORY_CARE_PROVIDER_SITE_OTHER): Payer: Medicare PPO

## 2023-06-25 ENCOUNTER — Ambulatory Visit: Admitting: Urology

## 2023-06-25 VITALS — Ht 68.0 in | Wt 171.0 lb

## 2023-06-25 DIAGNOSIS — Z0000 Encounter for general adult medical examination without abnormal findings: Secondary | ICD-10-CM

## 2023-06-25 DIAGNOSIS — N3281 Overactive bladder: Secondary | ICD-10-CM

## 2023-06-25 NOTE — Progress Notes (Signed)
 Subjective:   Shannon [name] is a 73 y.o. who presents for a Medicare Wellness preventive visit.  As a reminder, Annual Wellness Visits don't include a physical exam, and some assessments may be limited, especially if this visit is performed virtually. We may recommend an in-person follow-up visit with your provider if needed.  Visit Complete: Virtual I connected with  Shannon [name] on 06/25/23 by a audio enabled telemedicine application and verified that I am speaking with the correct person using two identifiers.  Patient Location: Home  Provider Location: Office/Clinic  I discussed the limitations of evaluation and management by telemedicine. The patient expressed understanding and agreed to proceed.  Vital Signs: Because this visit was a virtual/telehealth visit, some criteria may be missing or patient reported. Any vitals not documented were not able to be obtained and vitals that have been documented are patient reported.  VideoDeclined- This patient declined Librarian, academic. Therefore the visit was completed with audio only.  Persons Participating in Visit: Patient.  AWV Questionnaire: Yes: Patient Medicare AWV questionnaire was completed by the patient on 06/24/23; I have confirmed that all information answered by patient is correct and no changes since this date.  Cardiac Risk Factors include: advanced age (>26men, >6 women)     Objective:     Today's Vitals   06/25/23 0814  Weight: 171 lb (77.6 kg)  Height: 5\' 8"  (1.727 m)   Body mass index is 26 kg/m.     06/25/2023    8:18 AM 06/24/2022    8:09 AM 08/13/2021    6:19 AM 08/07/2021    9:11 AM 06/07/2021    1:07 PM 12/25/2020    9:34 AM 09/08/2019    9:49 AM  Advanced Directives  Does Patient Have a Medical Advance Directive? Yes Yes Yes Yes Yes Yes Yes  Type of Estate agent of [place];Living will Healthcare Power of [place];Living will Healthcare  Power of [place];Living will Healthcare Power of [place];Living will Healthcare Power of [place];Living will Living will Living will  Does patient want to make changes to medical advance directive?  No - Patient declined No - Patient declined No - Patient declined No - Patient declined No - Patient declined   Copy of Healthcare Power of Attorney in Chart? Yes - validated most recent copy scanned in chart (See row information) Yes - validated most recent copy scanned in chart (See row information)  No - copy requested Yes - validated most recent copy scanned in chart (See row information)      Current Medications (verified) Outpatient Encounter Medications as of 06/25/2023  Medication Sig   Calcium Carbonate-Simethicone (PHAZYME GAS & ACID MAX ST PO) Take 1 tablet by mouth as needed (bloating).   CALCIUM CITRATE PO Take 500 mg by mouth daily.   cholecalciferol (VITAMIN D3) 25 MCG (1000 UNIT) tablet Take 1,000 Units by mouth daily.   diclofenac Sodium (VOLTAREN) 1 % GEL Apply 1 Application topically 4 (four) times daily as needed (pain).   estradiol  (ESTRACE ) 0.1 MG/GM vaginal cream Apply one pea-sized amount around the opening of the urethra daily for 2 weeks, then 3 times weekly moving forward.   famotidine (PEPCID) 20 MG tablet Take 20 mg by mouth daily as needed for heartburn or indigestion.   GEMTESA  75 MG TABS Take 1 tablet by mouth daily.   MAGNESIUM GLYCINATE PO Take 400 mg by mouth daily.   melatonin 5 MG TABS Take 5 mg by mouth at bedtime.  Misc Natural Products (PUMPKIN SEED OIL PO) Take 200 mg by mouth in the morning and at bedtime.   Multiple Vitamin (MULTIVITAMIN) tablet Take 1 tablet by mouth daily.   No facility-administered encounter medications on file as of 06/25/2023.    Allergies (verified) Adhesive [tape], Alpha-gal, Neomycin-bacitracin zn-polymyx, and Nickel   History: Past Medical History:  Diagnosis Date   Arthritis    left foot   Blood transfusion without  reported diagnosis    Chronic neck and back pain    received physical therapy   Dysrhythmia    Pt told she had "benign arrythmia" after wearing monitor   GERD (gastroesophageal reflux disease)    with esophagitis   Motion sickness    boats, planes   Numbness    Right outer thigh, constant, notices more with standing for a long period of time   Plantar fasciitis    left   PONV (postoperative nausea and vomiting)    Past Surgical History:  Procedure Laterality Date   ARTHRODESIS METATARSAL Left 12/30/2018   Procedure: ARTHRODESIS,LISFRANC;MULTIPLE LEFT;  Surgeon: Anell Baptist, DPM;  Location: [facility];  Service: Podiatry;  Laterality: Left;  general with local   BLADDER SUSPENSION N/A 07/04/2015   Procedure: TRANSVAGINAL TAPE (TVT) SLING                   ;  Surgeon: Ana Balling, MD;  Location: [facility];  Service: Gynecology;  Laterality: N/A;   COLONOSCOPY     COLONOSCOPY WITH PROPOFOL  N/A 09/08/2019   Procedure: COLONOSCOPY WITH PROPOFOL ;  Surgeon: Irby Mannan, MD;  Location: [facility] END[facility];  Service: Endoscopy;  Laterality: N/A;   COLONOSCOPY WITH PROPOFOL  N/A 10/08/2022   Procedure: COLONOSCOPY WITH PROPOFOL ;  Surgeon: Selena Daily, MD;  Location: [facility] END[facility];  Service: Gastroenterology;  Laterality: N/A;   CYSTOCELE REPAIR N/A 07/04/2015   Procedure: ANTERIOR REPAIR (CYSTOCELE);  Surgeon: Ana Balling, MD;  Location: [facility];  Service: Gynecology;  Laterality: N/A;   CYSTOSCOPY N/A 07/04/2015   Procedure: CYSTOSCOPY;  Surgeon: Ana Balling, MD;  Location: [facility];  Service: Gynecology;  Laterality: N/A;   DILATION AND CURETTAGE OF UTERUS     EXCISION MORTON'S NEUROMA Left 08/12/2018   Procedure: EXCISION MORTON'S NEUROMA;  Surgeon: Anell Baptist, DPM;  Location: [facility];  Service: Podiatry;  Laterality: Left;  lma local   POLYPECTOMY     POLYPECTOMY  10/08/2022   Procedure: POLYPECTOMY;  Surgeon: Selena Daily, MD;  Location: [facility] END[facility];   Service: Gastroenterology;;   TOTAL KNEE ARTHROPLASTY Right 08/13/2021   Procedure: RIGHT TOTAL KNEE ARTHROPLASTY;  Surgeon: Wes Hamman, MD;  Location: [facility];  Service: Orthopedics;  Laterality: Right;   TUBAL LIGATION  1987   UPPER GI ENDOSCOPY     VAGINAL HYSTERECTOMY N/A 07/04/2015   Procedure: TOTAL VAGINAL HYSTERECTOMY  ;  Surgeon: Ana Balling, MD;  Location: [facility];  Service: Gynecology;  Laterality: N/A;   Family History  Problem Relation Age of Onset   Stroke Mother        x 2   Heart disease Mother        congenital arrhythmia and CHF   Depression Mother    Atrial fibrillation Mother    Stomach cancer Paternal Uncle    Prostate cancer Brother    Atrial fibrillation Brother    Colon cancer Neg Hx    Esophageal cancer Neg Hx    Kidney cancer Neg Hx  Bladder Cancer Neg Hx    Breast cancer Neg Hx    Social History   Socioeconomic History   Marital status: Married    Spouse name: Not on file   Number of children: Not on file   Years of education: Not on file   Highest education level: Master's degree (e.g., MA, MS, MEng, MEd, MSW, MBA)  Occupational History   Not on file  Tobacco Use   Smoking status: Never   Smokeless tobacco: Never  Vaping Use   Vaping status: Never Used  Substance and Sexual Activity   Alcohol use: No    Alcohol/week: 0.0 standard drinks of alcohol    Comment: rare   Drug use: No   Sexual activity: Not Currently    Birth control/protection: Post-menopausal, Surgical  Other Topics Concern   Not on file  Social History Narrative   Not on file   Social Drivers of Health   Financial Resource Strain: Low Risk  (06/24/2023)   Overall Financial Resource Strain (CARDIA)    Difficulty of Paying Living Expenses: Not hard at all  Food Insecurity: No Food Insecurity (06/24/2023)   Hunger Vital Sign    Worried About Running Out of Food in the Last Year: Never true    Ran Out of Food in the Last Year: Never true  Transportation Needs: No  Transportation Needs (06/24/2023)   PRAPARE - Administrator, Civil Service (Medical): No    Lack of Transportation (Non-Medical): No  Physical Activity: Insufficiently Active (06/24/2023)   Exercise Vital Sign    Days of Exercise per Week: 3 days    Minutes of Exercise per Session: 30 min  Stress: No Stress Concern Present (06/24/2023)   [employer] of Occupational Health - Occupational Stress Questionnaire    Feeling of Stress : Only a little  Social Connections: Moderately Isolated (06/24/2023)   Social Connection and Isolation Panel [NHANES]    Frequency of Communication with Friends and Family: Once a week    Frequency of Social Gatherings with Friends and Family: Once a week    Attends Religious Services: Never    Database administrator or Organizations: No    Attends Engineer, structural: More than 4 times per year    Marital Status: Married    Tobacco Counseling Counseling given: Not Answered    Clinical Intake:  Pre-visit preparation completed: Yes  Pain : No/denies pain     BMI - recorded: 26 Nutritional Status: BMI 25 -29 Overweight Nutritional Risks: None Diabetes: No  Lab Results  Component Value Date   HGBA1C 6.3 06/14/2022   HGBA1C 6.2 06/01/2021   HGBA1C 6.3 06/06/2020     How often do you need to have someone help you when you read instructions, pamphlets, or other written materials from your doctor or pharmacy?: 1 - Never  Interpreter Needed?: No  Comments: lives with husband Information entered by :: B.Tyara Dassow,LPN   Activities of Daily Living     06/24/2023    6:07 PM  In your present state of health, do you have any difficulty performing the following activities:  Hearing? 0  Vision? 0  Difficulty concentrating or making decisions? 0  Walking or climbing stairs? 0  Dressing or bathing? 0  Doing errands, shopping? 0  Preparing Food and eating ? N  Using the Toilet? N  In the past six months, have you accidently  leaked urine? Y  Do you have problems with loss of bowel control? N  Managing your Medications? N  Managing your Finances? N  Housekeeping or managing your Housekeeping? N    Patient Care Team: Tower, Manley Seeds, MD as PCP - Linton Richter, MD as Consulting Physician (Obstetrics and Gynecology)  I have updated your Care Teams any recent Medical Services you may have received from other providers in the past year.     Assessment:    This is a routine wellness examination for [place].  Hearing/Vision screen Hearing Screening - Comments:: Pt says her hearing is good Vision Screening - Comments:: Pt says her vision is good w/glasses Dr Merrell Abate   Goals Addressed               This Visit's Progress     COMPLETED: Increase physical activity        Starting 12/24/2017, I will continue to exercise with personal trainer for 30 minutes twice weekly and to walk for 1/2-1 mile twice weekly.       COMPLETED: Increase physical activity (pt-stated)   On track     After knee surgery.      COMPLETED: Patient Stated        04/28/2019, I will continue to walk 2 miles everyday.       COMPLETED: Patient Stated   On track     Walk more.      Patient Stated        I would like to lose weight 10lb       Depression Screen     06/25/2023    8:16 AM 05/05/2023    4:08 PM 12/03/2022    8:36 AM 07/31/2022    3:41 PM 06/24/2022    8:08 AM 06/07/2021    1:02 PM 06/06/2020    2:35 PM  PHQ 2/9 Scores  PHQ - 2 Score 0 1 0 0 0 0 0  PHQ- 9 Score   2 2   2     Fall Risk     06/24/2023    6:07 PM 05/05/2023    4:08 PM 12/03/2022    8:36 AM 06/24/2022    8:09 AM 06/07/2021    1:06 PM  Fall Risk   Falls in the past year? 0 0 1 0 0  Number falls in past yr: 0 0 0 0 0  Injury with Fall? 0 0 0 0 0  Risk for fall due to : No Fall Risks No Fall Risks History of fall(s) No Fall Risks No Fall Risks  Follow up Education provided;Falls prevention discussed Falls evaluation completed Falls evaluation  completed Falls prevention discussed;Falls evaluation completed     MEDICARE RISK AT HOME:  Medicare Risk at Home Any stairs in or around the home?: (Patient-Rptd) Yes If so, are there any without handrails?: (Patient-Rptd) No Home free of loose throw rugs in walkways, pet beds, electrical cords, etc?: (Patient-Rptd) Yes Adequate lighting in your home to reduce risk of falls?: (Patient-Rptd) Yes Life alert?: (Patient-Rptd) No Use of a cane, walker or w/c?: (Patient-Rptd) No Grab bars in the bathroom?: (Patient-Rptd) Yes Shower chair or bench in shower?: (Patient-Rptd) Yes Elevated toilet seat or a handicapped toilet?: (Patient-Rptd) No  TIMED UP AND GO:  Was the test performed?  No  Cognitive Function: 6CIT completed    04/28/2019    9:48 AM 12/24/2017    9:23 AM 12/16/2016    1:36 PM 11/24/2015    9:26 AM  MMSE - Mini Mental State Exam  Orientation to time 5 5 5  5  Orientation to Place 5 5 5 5   Registration 3 3 3 3   Attention/ Calculation 5 0 0 0  Recall 3 3 3 3   Language- name 2 objects  0 0 0  Language- repeat 1 1 1 1   Language- follow 3 step command  3 3 3   Language- read & follow direction  0 0 0  Write a sentence  0 0 0  Copy design  0 0 0  Total score  20 20 20         06/25/2023    8:20 AM 06/24/2022    8:10 AM 06/07/2021    1:07 PM  6CIT Screen  What Year? 0 points 0 points 0 points  What month? 0 points 0 points 0 points  What time? 0 points 0 points 0 points  Count back from 20 0 points 0 points 0 points  Months in reverse 0 points 0 points 0 points  Repeat phrase 0 points 0 points 0 points  Total Score 0 points 0 points 0 points    Immunizations Immunization History  Administered Date(s) Administered   Influenza Whole 01/21/2005, 11/10/2006   Influenza, High Dose Seasonal PF 10/25/2016, 09/30/2017, 09/10/2018, 10/06/2022   Influenza-Unspecified 10/31/2012, 11/21/2014, 10/05/2015   PFIZER(Purple Top)SARS-COV-2 Vaccination 02/27/2019, 03/24/2019,  10/21/2019   Pneumococcal Conjugate-13 11/30/2014   Pneumococcal Polysaccharide-23 11/17/2013, 09/10/2018   Td 10/15/2001, 02/09/2010   Zoster Recombinant(Shingrix) 05/03/2016   Zoster, Live 09/04/2010    Screening Tests Health Maintenance  Topic Date Due   DTaP/Tdap/Td (3 - Tdap) 12/03/2023 (Originally 02/10/2020)   INFLUENZA VACCINE  08/22/2023   MAMMOGRAM  06/01/2024   Medicare Annual Wellness (AWV)  06/24/2024   Colonoscopy  10/07/2025   Pneumonia Vaccine 62+ Years old  Completed   DEXA SCAN  Completed   Hepatitis C Screening  Completed   HPV VACCINES  Aged Out   Meningococcal B Vaccine  Aged Out   COVID-19 Vaccine  Discontinued   Zoster Vaccines- Shingrix  Discontinued    Health Maintenance  There are no preventive care reminders to display for this patient.  Health Maintenance Items Addressed: None needed at this time  Additional Screening:  Vision Screening: Recommended annual ophthalmology exams for early detection of glaucoma and other disorders of the eye. Would you like a referral to an eye doctor? No    Dental Screening: Recommended annual dental exams for proper oral hygiene  Community Resource Referral / Chronic Care Management: CRR required this visit?  No   CCM required this visit?  No   Plan:    I have personally reviewed and noted the following in the patient's chart:   Medical and social history Use of alcohol, tobacco or illicit drugs  Current medications and supplements including opioid prescriptions. Patient is not currently taking opioid prescriptions. Functional ability and status Nutritional status Physical activity Advanced directives List of other physicians Hospitalizations, surgeries, and ER visits in previous 12 months Vitals Screenings to include cognitive, depression, and falls Referrals and appointments  In addition, I have reviewed and discussed with patient certain preventive protocols, quality metrics, and best practice  recommendations. A written personalized care plan for preventive services as well as general preventive health recommendations were provided to patient.   Nerissa Bannister, LPN   01/26/1094   After Visit Summary: (MyChart) Due to this being a telephonic visit, the after visit summary with patients personalized plan was offered to patient via MyChart   Notes: Nothing significant to report at this time.

## 2023-06-25 NOTE — Progress Notes (Signed)
 PTNS  Session # 6/12  Health & Social Factors: No change Caffeine: 0 Alcohol: 0 Daytime voids #per day: 13 Night-time voids #per night: 4 Urgency: Strong Incontinence Episodes #per day: 2 Ankle used: Left Treatment Setting: 10 Feeling/ Response: Sensory Comments: Patient tolerated the procedure  Performed By: Matilde Son, PA-C   Follow Up: 1 week for number 7 out of 12 PTNS

## 2023-06-25 NOTE — Patient Instructions (Addendum)
 Shannon [name] , Thank you for taking time out of your busy schedule to complete your Annual Wellness Visit with me. I enjoyed our conversation and look forward to speaking with you again next year. I, as well as your care team,  appreciate your ongoing commitment to your health goals. Please review the following plan we discussed and let me know if I can assist you in the future. Your Game plan/ To Do List     Follow up Visits: Next Medicare AWV with our clinical staff: 06/24/24 @ 8:50am televisit   Have you seen your provider in the last 6 months (3 months if uncontrolled diabetes)? No Next Office Visit with your provider: 08/12/23  Clinician Recommendations:  Aim for 30 minutes of exercise or brisk walking, 6-8 glasses of water , and 5 servings of fruits and vegetables each day.       This is a list of the screening recommended for you and due dates:  Health Maintenance  Topic Date Due   DTaP/Tdap/Td vaccine (3 - Tdap) 12/03/2023*   Flu Shot  08/22/2023   Mammogram  06/01/2024   Medicare Annual Wellness Visit  06/24/2024   Colon Cancer Screening  10/07/2025   Pneumonia Vaccine  Completed   DEXA scan (bone density measurement)  Completed   Hepatitis C Screening  Completed   HPV Vaccine  Aged Out   Meningitis B Vaccine  Aged Out   COVID-19 Vaccine  Discontinued   Zoster (Shingles) Vaccine  Discontinued  *Topic was postponed. The date shown is not the original due date.    Advanced directives: (In Chart) A copy of your advanced directives are scanned into your chart should your provider ever need it. Advance Care Planning is important because it:  [x]  Makes sure you receive the medical care that is consistent with your values, goals, and preferences  [x]  It provides guidance to your family and loved ones and reduces their decisional burden about whether or not they are making the right decisions based on your wishes.  Follow the link provided in your after visit summary or read over the  paperwork we have mailed to you to help you started getting your Advance Directives in place. If you need assistance in completing these, please reach out to us  so that we can help you!

## 2023-07-01 NOTE — Progress Notes (Unsigned)
 PTNS  Session # 7/12  Health & Social Factors: No change Caffeine: 4 Alcohol: 0 Daytime voids #per day: 8 Night-time voids #per night: 9 Urgency: Severe Incontinence Episodes #per day: 1 Ankle used: Left Treatment Setting: 3 Feeling/ Response: Sensory  Comments: Patient tolerated  Performed By: Michiel Cowboy, PA-C   Follow Up: In 1 week for number 8 out of 12 PTNS

## 2023-07-02 ENCOUNTER — Ambulatory Visit: Admitting: Urology

## 2023-07-02 DIAGNOSIS — N3281 Overactive bladder: Secondary | ICD-10-CM | POA: Diagnosis not present

## 2023-07-02 NOTE — Patient Instructions (Signed)

## 2023-07-09 ENCOUNTER — Ambulatory Visit: Admitting: Urology

## 2023-07-10 ENCOUNTER — Ambulatory Visit: Admitting: Physician Assistant

## 2023-07-10 DIAGNOSIS — N3281 Overactive bladder: Secondary | ICD-10-CM | POA: Diagnosis not present

## 2023-07-10 NOTE — Progress Notes (Signed)
 PTNS  Session # 8  Health & Social Factors: no change Caffeine: none Alcohol: none Daytime voids #per day: 9 Night-time voids #per night: 2-3 Urgency: strong to mild-slightly better this week Incontinence Episodes #per day: 2 on average Ankle used: left Treatment Setting: 6 Feeling/ Response: sensory Comments: none  Performed By: Solon Dura h RMA  Follow Up: 1 week

## 2023-07-10 NOTE — Patient Instructions (Signed)

## 2023-07-15 NOTE — Progress Notes (Signed)
 PTNS  Session # 9/12  Health & Social Factors: No change  Caffeine: 0 Alcohol: 0 Daytime voids #per day: 12 Night-time voids #per night: 5 Urgency: strong Incontinence Episodes #per day: 3 Ankle used: Left Treatment Setting: 8 Feeling/ Response: Sensory Comments: Patient tolerated PTNS.   She mentions that she has a worse week.  She has had a couple days of an upset stomach and some diarrhea.  She has also had an increase in her urinary frequency and nocturia.   Patient denies any modifying or aggravating factors.  Patient denies any recent UTI's, gross hematuria, dysuria or suprapubic/flank pain.  Patient denies any fevers, chills, nausea or vomiting.    UA was not suspicious for infection, but I will send it for culture to rule out infection.  We will reassess next week.   Performed By: CLOTILDA CORNWALL, PA-C   Follow Up: One week for 9/12 PTNS

## 2023-07-16 ENCOUNTER — Ambulatory Visit: Admitting: Urology

## 2023-07-16 ENCOUNTER — Ambulatory Visit: Payer: Self-pay | Admitting: Urology

## 2023-07-16 DIAGNOSIS — R351 Nocturia: Secondary | ICD-10-CM

## 2023-07-16 DIAGNOSIS — N3281 Overactive bladder: Secondary | ICD-10-CM | POA: Diagnosis not present

## 2023-07-16 LAB — URINALYSIS, COMPLETE
Bilirubin, UA: NEGATIVE
Glucose, UA: NEGATIVE
Ketones, UA: NEGATIVE
Leukocytes,UA: NEGATIVE
Nitrite, UA: NEGATIVE
Protein,UA: NEGATIVE
RBC, UA: NEGATIVE
Specific Gravity, UA: 1.02 (ref 1.005–1.030)
Urobilinogen, Ur: 0.2 mg/dL (ref 0.2–1.0)
pH, UA: 6 (ref 5.0–7.5)

## 2023-07-16 LAB — MICROSCOPIC EXAMINATION: Epithelial Cells (non renal): >10 /HPF — AB (ref 0–10)

## 2023-07-21 LAB — CULTURE, URINE COMPREHENSIVE

## 2023-07-24 ENCOUNTER — Ambulatory Visit: Admitting: Urology

## 2023-07-24 DIAGNOSIS — N3281 Overactive bladder: Secondary | ICD-10-CM | POA: Diagnosis not present

## 2023-07-24 NOTE — Progress Notes (Signed)
 PTNS  Session # 10  Health & Social Factors: no change Caffeine: 0 Alcohol: 0 Daytime voids #per day: 8-13 Night-time voids #per night: 2-5 Urgency: strong Incontinence Episodes #per day: 2-5 Ankle used: Right Treatment Setting: 1 Feeling/ Response: Sensory Comments: Patient tolerated  Performed By: CLOTILDA CORNWALL, PA-C   Follow Up: One week PTNS # 11  Her symptoms from last week have resolved.

## 2023-07-29 NOTE — Progress Notes (Unsigned)
 PTNS  Session # 11  Health & Social Factors: No change Caffeine: 0 Alcohol: 0 Daytime voids #per day: 11 Night-time voids #per night: 3 Urgency: mild Incontinence Episodes #per day: 2 Ankle used: Left Treatment Setting: 5 Feeling/ Response: Sensory  Comments: Patient tolerated procedure   Performed By: CLOTILDA CORNWALL, PA-C   Follow Up: 1 week PTNS #12

## 2023-07-30 ENCOUNTER — Ambulatory Visit: Admitting: Urology

## 2023-07-30 DIAGNOSIS — N3281 Overactive bladder: Secondary | ICD-10-CM

## 2023-08-05 NOTE — Progress Notes (Unsigned)
 PTNS  Session # 12  Health & Social Factors: no change Caffeine: 1 cup Alcohol: 0 Daytime voids #per day: 6-7 Night-time voids #per night: 2-3 Urgency: strong Incontinence Episodes #per day: 2-3 Ankle used: right Treatment Setting: 5 Feeling/ Response: sensory Comments: pt tolerated well  Performed By: Randa Lynn, RMA  Follow Up: 1 month

## 2023-08-06 ENCOUNTER — Ambulatory Visit: Admitting: Urology

## 2023-08-06 DIAGNOSIS — N3281 Overactive bladder: Secondary | ICD-10-CM | POA: Diagnosis not present

## 2023-08-06 NOTE — Patient Instructions (Signed)

## 2023-08-08 ENCOUNTER — Encounter: Payer: Self-pay | Admitting: Orthopaedic Surgery

## 2023-08-08 ENCOUNTER — Ambulatory Visit: Payer: Medicare PPO | Admitting: Orthopaedic Surgery

## 2023-08-08 ENCOUNTER — Other Ambulatory Visit (INDEPENDENT_AMBULATORY_CARE_PROVIDER_SITE_OTHER): Payer: Self-pay

## 2023-08-08 DIAGNOSIS — Z96651 Presence of right artificial knee joint: Secondary | ICD-10-CM | POA: Diagnosis not present

## 2023-08-08 NOTE — Progress Notes (Signed)
 Patient: Shannon [name]           Date of Birth: Dec 03, 1950           MRN: [id number] Visit Date: 08/08/2023 PCP: Randeen Laine LABOR, MD   Assessment & Plan:  Chief Complaint:  Chief Complaint  Patient presents with   Right Knee - Pain    Right total knee arthroplasty 08/13/2021   Visit Diagnoses:  1. Status post total right knee replacement     Plan: History of Present Illness Shannon [name] is a 73 year old female who presents for a two-year follow-up after knee replacement surgery.  Two years post knee replacement, her knee functions well, allowing her to walk without difficulty. Pain occurs when kneeling directly on the knee, but she can kneel briefly with a pillow for support. Discomfort arises if the knee is bent too far, such as when sitting on a very low seat. Despite these limitations, she is satisfied with the surgical outcome.  Results Knee X-ray: Femur and tibia with robust cement mantle around implants, no loosening or subsidence (08/07/2021)  Assessment and Plan Post knee replacement discomfort Two years post-surgery with satisfactory function. Kneeling discomfort common due to incision placement. X-rays show stable implants. - Discontinued prophylactic antibiotics before dental cleanings unless invasive procedures. - Provided contact information for future concerns.  Follow-Up Instructions: No follow-ups on file.   Orders:  Orders Placed This Encounter  Procedures   XR Knee 1-2 Views Right   No orders of the defined types were placed in this encounter.   Imaging: XR Knee 1-2 Views Right Result Date: 08/08/2023 X-rays of the right knee demonstrate a stable right total knee replacement in good alignment    PMFS History: Patient Active Problem List   Diagnosis Date Noted   Recurrent cystitis 05/05/2023   Bronchitis 12/03/2022   Osteopenia 11/11/2022   COVID-19 virus infection 10/28/2022   Rectal polyp 10/08/2022   History of colonic  polyps 10/08/2022   Hearing loss due to cerumen impaction, left 08/12/2022   Lipid screening 06/13/2022   Status post total right knee replacement 08/13/2021   Pre-operative cardiovascular examination 06/10/2021   Pre-op examination 05/31/2021   Primary osteoarthritis of right knee 03/28/2021   Polyp of colon    History of PSVT (paroxysmal supraventricular tachycardia) 06/17/2017   Recurrent UTI 08/23/2016   Estrogen deficiency 12/06/2015   Prediabetes 12/06/2015   Incontinence of urine in female 04/12/2015   Medicare annual wellness visit, subsequent 11/30/2014   Routine general medical examination at a health care facility 11/05/2012   PLANTAR FASCIITIS, LEFT 07/28/2009   History of colonic polyps 11/02/2007   GERD 10/20/2006   Past Medical History:  Diagnosis Date   Arthritis    left foot   Blood transfusion without reported diagnosis    Chronic neck and back pain    received physical therapy   Dysrhythmia    Pt told she had benign arrythmia after wearing monitor   GERD (gastroesophageal reflux disease)    with esophagitis   Motion sickness    boats, planes   Numbness    Right outer thigh, constant, notices more with standing for a long period of time   Plantar fasciitis    left   PONV (postoperative nausea and vomiting)     Family History  Problem Relation Age of Onset   Stroke Mother        x 2   Heart disease Mother  congenital arrhythmia and CHF   Depression Mother    Atrial fibrillation Mother    Stomach cancer Paternal Uncle    Prostate cancer Brother    Atrial fibrillation Brother    Colon cancer Neg Hx    Esophageal cancer Neg Hx    Kidney cancer Neg Hx    Bladder Cancer Neg Hx    Breast cancer Neg Hx     Past Surgical History:  Procedure Laterality Date   ARTHRODESIS METATARSAL Left 12/30/2018   Procedure: ARTHRODESIS,LISFRANC;MULTIPLE LEFT;  Surgeon: Ashley Soulier, DPM;  Location: [facility];  Service: Podiatry;  Laterality:  Left;  general with local   BLADDER SUSPENSION N/A 07/04/2015   Procedure: TRANSVAGINAL TAPE (TVT) SLING                   ;  Surgeon: Harland JAYSON Birkenhead, MD;  Location: [facility];  Service: Gynecology;  Laterality: N/A;   COLONOSCOPY     COLONOSCOPY WITH PROPOFOL  N/A 09/08/2019   Procedure: COLONOSCOPY WITH PROPOFOL ;  Surgeon: Janalyn Keene NOVAK, MD;  Location: [facility] END[facility]COPY;  Service: Endoscopy;  Laterality: N/A;   COLONOSCOPY WITH PROPOFOL  N/A 10/08/2022   Procedure: COLONOSCOPY WITH PROPOFOL ;  Surgeon: Unk Corinn Skiff, MD;  Location: [facility];  Service: Gastroenterology;  Laterality: N/A;   CYSTOCELE REPAIR N/A 07/04/2015   Procedure: ANTERIOR REPAIR (CYSTOCELE);  Surgeon: Harland JAYSON Birkenhead, MD;  Location: [facility];  Service: Gynecology;  Laterality: N/A;   CYSTOSCOPY N/A 07/04/2015   Procedure: CYSTOSCOPY;  Surgeon: Harland JAYSON Birkenhead, MD;  Location: [facility];  Service: Gynecology;  Laterality: N/A;   DILATION AND CURETTAGE OF UTERUS     EXCISION MORTON'S NEUROMA Left 08/12/2018   Procedure: EXCISION MORTON'S NEUROMA;  Surgeon: Ashley Soulier, DPM;  Location: [facility];  Service: Podiatry;  Laterality: Left;  lma local   POLYPECTOMY     POLYPECTOMY  10/08/2022   Procedure: POLYPECTOMY;  Surgeon: Unk Corinn Skiff, MD;  Location: [facility] END[facility]COPY;  Service: Gastroenterology;;   TOTAL KNEE ARTHROPLASTY Right 08/13/2021   Procedure: RIGHT TOTAL KNEE ARTHROPLASTY;  Surgeon: Jerri Kay HERO, MD;  Location: [facility];  Service: Orthopedics;  Laterality: Right;   TUBAL LIGATION  1987   UPPER GI ENDOSCOPY     VAGINAL HYSTERECTOMY N/A 07/04/2015   Procedure: TOTAL VAGINAL HYSTERECTOMY  ;  Surgeon: Harland JAYSON Birkenhead, MD;  Location: [facility];  Service: Gynecology;  Laterality: N/A;   Social History   Occupational History   Not on file  Tobacco Use   Smoking status: Never   Smokeless tobacco: Never  Vaping Use   Vaping status: Never Used  Substance and Sexual Activity   Alcohol use: No    Alcohol/week: 0.0  standard drinks of alcohol    Comment: rare   Drug use: No   Sexual activity: Not Currently    Birth control/protection: Post-menopausal, Surgical

## 2023-08-12 ENCOUNTER — Encounter: Payer: Self-pay | Admitting: Family Medicine

## 2023-08-12 ENCOUNTER — Ambulatory Visit: Payer: Self-pay | Admitting: Family Medicine

## 2023-08-12 ENCOUNTER — Ambulatory Visit (INDEPENDENT_AMBULATORY_CARE_PROVIDER_SITE_OTHER): Admitting: Family Medicine

## 2023-08-12 VITALS — BP 102/64 | HR 72 | Temp 98.3°F | Ht 68.0 in | Wt 169.0 lb

## 2023-08-12 DIAGNOSIS — M85839 Other specified disorders of bone density and structure, unspecified forearm: Secondary | ICD-10-CM | POA: Diagnosis not present

## 2023-08-12 DIAGNOSIS — Z13220 Encounter for screening for lipoid disorders: Secondary | ICD-10-CM | POA: Diagnosis not present

## 2023-08-12 DIAGNOSIS — Z0000 Encounter for general adult medical examination without abnormal findings: Secondary | ICD-10-CM

## 2023-08-12 DIAGNOSIS — R7303 Prediabetes: Secondary | ICD-10-CM

## 2023-08-12 DIAGNOSIS — K219 Gastro-esophageal reflux disease without esophagitis: Secondary | ICD-10-CM

## 2023-08-12 LAB — COMPREHENSIVE METABOLIC PANEL WITH GFR
ALT: 14 U/L (ref 0–35)
AST: 18 U/L (ref 0–37)
Albumin: 4.1 g/dL (ref 3.5–5.2)
Alkaline Phosphatase: 78 U/L (ref 39–117)
BUN: 25 mg/dL — ABNORMAL HIGH (ref 6–23)
CO2: 33 meq/L — ABNORMAL HIGH (ref 19–32)
Calcium: 9.2 mg/dL (ref 8.4–10.5)
Chloride: 103 meq/L (ref 96–112)
Creatinine, Ser: 0.76 mg/dL (ref 0.40–1.20)
GFR: 76.85 mL/min (ref >60.00)
Glucose, Bld: 100 mg/dL — ABNORMAL HIGH (ref 70–99)
Potassium: 5 meq/L (ref 3.5–5.1)
Sodium: 142 meq/L (ref 135–145)
Total Bilirubin: 0.5 mg/dL (ref 0.2–1.2)
Total Protein: 6.4 g/dL (ref 6.0–8.3)

## 2023-08-12 LAB — LIPID PANEL
Cholesterol: 198 mg/dL (ref 0–200)
HDL: 78.3 mg/dL (ref >39.00)
LDL Cholesterol: 107 mg/dL — ABNORMAL HIGH (ref 0–99)
NonHDL: 119.36
Total CHOL/HDL Ratio: 3
Triglycerides: 62 mg/dL (ref 0.0–149.0)
VLDL: 12.4 mg/dL (ref 0.0–40.0)

## 2023-08-12 LAB — TSH: TSH: 0.78 u[IU]/mL (ref 0.35–5.50)

## 2023-08-12 LAB — HEMOGLOBIN A1C: Hgb A1c MFr Bld: 6.7 % — ABNORMAL HIGH (ref 4.6–6.5)

## 2023-08-12 NOTE — Assessment & Plan Note (Signed)
 Pepcid prn Good diet

## 2023-08-12 NOTE — Progress Notes (Signed)
 Subjective:    Patient ID: Shannon [name], female    DOB: 1950-11-03, 73 y.o.   MRN: [id number]  HPI  Here for health maintenance exam and to review chronic medical problems   Wt Readings from Last 3 Encounters:  08/12/23 169 lb (76.7 kg)  06/25/23 171 lb (77.6 kg)  06/04/23 171 lb (77.6 kg)   25.70 kg/m  Vitals:   08/12/23 0901  BP: 102/64  Pulse: 72  Temp: 98.3 F (36.8 C)  SpO2: 97%    Immunization History  Administered Date(s) Administered   Influenza Whole 01/21/2005, 11/10/2006   Influenza, High Dose Seasonal PF 10/25/2016, 09/30/2017, 09/10/2018, 10/06/2022   Influenza-Unspecified 10/31/2012, 11/21/2014, 10/05/2015   PFIZER(Purple Top)SARS-COV-2 Vaccination 02/27/2019, 03/24/2019, 10/21/2019   Pneumococcal Conjugate-13 11/30/2014   Pneumococcal Polysaccharide-23 11/17/2013, 09/10/2018   Td 10/15/2001, 02/09/2010   Zoster Recombinant(Shingrix) 05/03/2016   Zoster, Live 09/04/2010    There are no preventive care reminders to display for this patient.  Feeling fine overall   Mammogram 05/2023  Self breast exam- no lumps   Gyn health [place] (sees gyn soon, prolapse is worse)  Vaginal estrace    Sees urology for overactive bladder  PTNS treatments are helping some  Still nocturia times 3    Colon cancer screening -colonoscopy 09/2022 -small polyp -not precancerous    Bone health  Dexa 10/2022 Osteopenia  Falls- none  Fractures-none  Supplements ca and D    Exercise :  Normally walks a lot - has been hard this summer  Not strength training / does have access      Mood    08/12/2023    9:09 AM 06/25/2023    8:16 AM 05/05/2023    4:08 PM 12/03/2022    8:36 AM 07/31/2022    3:41 PM  Depression screen PHQ 2/9  Decreased Interest 0 0 0 0 0  Down, Depressed, Hopeless 0 0 1 0 0  PHQ - 2 Score 0 0 1 0 0  Altered sleeping 1   1 1   Tired, decreased energy 1   1 1   Change in appetite 0   0 0  Feeling bad or failure about yourself  0   0 0   Trouble concentrating 0   0 0  Moving slowly or fidgety/restless 0   0 0  Suicidal thoughts 0   0 0  PHQ-9 Score 2   2 2   Difficult doing work/chores Not difficult at all   Not difficult at all Not difficult at all    Prediabetes Lab Results  Component Value Date   HGBA1C 6.7 (H) 08/12/2023   HGBA1C 6.3 06/14/2022   HGBA1C 6.2 06/01/2021  No changes in diet  Tries to avoid added sugar     GERD  Uses prn pepcid   Ate oatmeal and almond milk and egg today    Lipids good in the past  Lab Results  Component Value Date   CHOL 198 08/12/2023   HDL 78.30 08/12/2023   LDLCALC 107 (H) 08/12/2023   TRIG 62.0 08/12/2023   CHOLHDL 3 08/12/2023      Patient Active Problem List   Diagnosis Date Noted   Recurrent cystitis 05/05/2023   Osteopenia 11/11/2022   Rectal polyp 10/08/2022   History of colonic polyps 10/08/2022   Hearing loss due to cerumen impaction, left 08/12/2022   Lipid screening 06/13/2022   Status post total right knee replacement 08/13/2021   Primary osteoarthritis of right knee 03/28/2021   Polyp of colon  History of PSVT (paroxysmal supraventricular tachycardia) 06/17/2017   Recurrent UTI 08/23/2016   Estrogen deficiency 12/06/2015   Prediabetes 12/06/2015   Incontinence of urine in female 04/12/2015   Routine general medical examination at a health care facility 11/05/2012   PLANTAR FASCIITIS, LEFT 07/28/2009   History of colonic polyps 11/02/2007   GERD 10/20/2006   Past Medical History:  Diagnosis Date   Arthritis 2020   left foot   Blood transfusion without reported diagnosis    Chronic neck and back pain    received physical therapy   Dysrhythmia    Pt told she had benign arrythmia after wearing monitor   GERD (gastroesophageal reflux disease)    with esophagitis   Motion sickness    boats, planes   Numbness    Right outer thigh, constant, notices more with standing for a long period of time   Plantar fasciitis    left   PONV  (postoperative nausea and vomiting)    Past Surgical History:  Procedure Laterality Date   ABDOMINAL HYSTERECTOMY  2017   ARTHRODESIS METATARSAL Left 12/30/2018   Procedure: ARTHRODESIS,LISFRANC;MULTIPLE LEFT;  Surgeon: Ashley Soulier, DPM;  Location: [facility];  Service: Podiatry;  Laterality: Left;  general with local   BLADDER SUSPENSION N/A 07/04/2015   Procedure: TRANSVAGINAL TAPE (TVT) SLING                   ;  Surgeon: Harland JAYSON Birkenhead, MD;  Location: [facility];  Service: Gynecology;  Laterality: N/A;   COLONOSCOPY     COLONOSCOPY WITH PROPOFOL  N/A 09/08/2019   Procedure: COLONOSCOPY WITH PROPOFOL ;  Surgeon: Janalyn Keene NOVAK, MD;  Location: [facility] ENDOSCOPY;  Service: Endoscopy;  Laterality: N/A;   COLONOSCOPY WITH PROPOFOL  N/A 10/08/2022   Procedure: COLONOSCOPY WITH PROPOFOL ;  Surgeon: Unk Corinn Skiff, MD;  Location[facility];  Service: Gastroenterology;  Laterality: N/A;   CYSTOCELE REPAIR N/A 07/04/2015   Procedure: ANTERIOR REPAIR (CYSTOCELE);  Surgeon: Harland JAYSON Birkenhead, MD;  Location: [facility];  Service: Gynecology;  Laterality: N/A;   CYSTOSCOPY N/A 07/04/2015   Procedure: CYSTOSCOPY;  Surgeon: Harland JAYSON Birkenhead, MD;  Location: [facility];  Service: Gynecology;  Laterality: N/A;   DILATION AND CURETTAGE OF UTERUS     EXCISION MORTON'S NEUROMA Left 08/12/2018   Procedure: EXCISION MORTON'S NEUROMA;  Surgeon: Ashley Soulier, DPM;  Location[facility];  Service: Podiatry;  Laterality: Left;  lma local   JOINT REPLACEMENT  2023   POLYPECTOMY     POLYPECTOMY  10/08/2022   Procedure: POLYPECTOMY;  Surgeon: Unk Corinn Skiff, MD;  Location: [facility];  Service: Gastroenterology;;   TOTAL KNEE ARTHROPLASTY Right 08/13/2021   Procedure: RIGHT TOTAL KNEE ARTHROPLASTY;  Surgeon: Jerri Kay HERO, MD;  Location: [facility];  Service: Orthopedics;  Laterality: Right;   TUBAL LIGATION  1987   UPPER GI ENDOSCOPY     VAGINAL HYSTERECTOMY N/A 07/04/2015   Procedure: TOTAL VAGINAL  HYSTERECTOMY  ;  Surgeon: Harland JAYSON Birkenhead, MD;  Location: [facility];  Service: Gynecology;  Laterality: N/A;   Social History   Tobacco Use   Smoking status: Never   Smokeless tobacco: Never  Vaping Use   Vaping status: Never Used  Substance Use Topics   Alcohol use: No    Alcohol/week: 0.0 standard drinks of alcohol    Comment: rare   Drug use: No   Family History  Problem Relation Age of Onset   Stroke Mother  x 2   Heart disease Mother        congenital arrhythmia and CHF   Depression Mother    Atrial fibrillation Mother    Arthritis Mother    Vision loss Mother    Depression Father    Stomach cancer Paternal Uncle    Prostate cancer Brother    Atrial fibrillation Brother    Arthritis Maternal Aunt    ADD / ADHD Son    Birth defects Son    Intellectual disability Son    Cancer Paternal Uncle    Colon cancer Neg Hx    Esophageal cancer Neg Hx    Kidney cancer Neg Hx    Bladder Cancer Neg Hx    Breast cancer Neg Hx    Allergies  Allergen Reactions   Adhesive [Tape] Rash    (not sure if rash was from ointment or tape)   Alpha-Gal Rash    (red meat S/P tick bite)   Neomycin-Bacitracin Zn-Polymyx Rash    Ointment   Nickel Rash   Current Outpatient Medications on File Prior to Visit  Medication Sig Dispense Refill   Calcium Carbonate-Simethicone (PHAZYME GAS & ACID MAX ST PO) Take 1 tablet by mouth as needed (bloating).     CALCIUM CITRATE PO Take 500 mg by mouth daily.     cholecalciferol (VITAMIN D3) 25 MCG (1000 UNIT) tablet Take 1,000 Units by mouth daily.     Cranberry 250-30 MG TABS Take 2 tablets by mouth daily.     diclofenac Sodium (VOLTAREN) 1 % GEL Apply 1 Application topically 4 (four) times daily as needed (pain).     docusate sodium  (COLACE) 100 MG capsule Take 100 mg by mouth daily.     estradiol  (ESTRACE ) 0.1 MG/GM vaginal cream Apply one pea-sized amount around the opening of the urethra daily for 2 weeks, then 3 times weekly moving forward. 42.5  g 12   famotidine (PEPCID) 20 MG tablet Take 20 mg by mouth daily as needed for heartburn or indigestion.     GEMTESA  75 MG TABS Take 1 tablet by mouth daily.     Lactobacillus (REPHRESH PRO-B) CAPS Take 1 Capful by mouth daily.     MAGNESIUM GLYCINATE PO Take 400 mg by mouth daily.     melatonin 5 MG TABS Take 5 mg by mouth at bedtime.     Misc Natural Products (PUMPKIN SEED OIL PO) Take 200 mg by mouth in the morning and at bedtime.     Multiple Vitamin (MULTIVITAMIN) tablet Take 1 tablet by mouth daily.     PSYLLIUM HUSK PO Take 3 capsules by mouth in the morning and at bedtime.     ST JOHNS WORT PO Take 2 capsules by mouth in the morning and at bedtime.     No current facility-administered medications on file prior to visit.    Review of Systems  Constitutional:  Negative for activity change, appetite change, fatigue, fever and unexpected weight change.  HENT:  Negative for congestion, ear pain, rhinorrhea, sinus pressure and sore throat.   Eyes:  Negative for pain, redness and visual disturbance.  Respiratory:  Negative for cough, shortness of breath and wheezing.   Cardiovascular:  Negative for chest pain and palpitations.  Gastrointestinal:  Negative for abdominal pain, blood in stool, constipation and diarrhea.  Endocrine: Negative for polydipsia and polyuria.  Genitourinary:  Positive for frequency. Negative for dysuria and urgency.  Musculoskeletal:  Negative for arthralgias, back pain and myalgias.  Skin:  Negative for pallor and rash.  Allergic/Immunologic: Negative for environmental allergies.  Neurological:  Negative for dizziness, syncope and headaches.  Hematological:  Negative for adenopathy. Does not bruise/bleed easily.  Psychiatric/Behavioral:  Negative for decreased concentration and dysphoric mood. The patient is not nervous/anxious.        Objective:   Physical Exam Constitutional:      General: She is not in acute distress.    Appearance: Normal appearance.  She is well-developed and normal weight. She is not ill-appearing or diaphoretic.  HENT:     Head: Normocephalic and atraumatic.     Right Ear: Tympanic membrane, ear canal and external ear normal.     Left Ear: Tympanic membrane, ear canal and external ear normal.     Nose: Nose normal. No congestion.     Mouth/Throat:     Mouth: Mucous membranes are moist.     Pharynx: Oropharynx is clear. No posterior oropharyngeal erythema.  Eyes:     General: No scleral icterus.    Extraocular Movements: Extraocular movements intact.     Conjunctiva/sclera: Conjunctivae normal.     Pupils: Pupils are equal, round, and reactive to light.  Neck:     Thyroid : No thyromegaly.     Vascular: No carotid bruit or JVD.  Cardiovascular:     Rate and Rhythm: Normal rate and regular rhythm.     Pulses: Normal pulses.     Heart sounds: Normal heart sounds.     No gallop.  Pulmonary:     Effort: Pulmonary effort is normal. No respiratory distress.     Breath sounds: Normal breath sounds. No wheezing.     Comments: Good air exch Chest:     Chest wall: No tenderness.  Abdominal:     General: Bowel sounds are normal. There is no distension or abdominal bruit.     Palpations: Abdomen is soft. There is no mass.     Tenderness: There is no abdominal tenderness.     Hernia: No hernia is present.  Genitourinary:    Comments: Breast exam: No mass, nodules, thickening, tenderness, bulging, retraction, inflamation, nipple discharge or skin changes noted.  No axillary or clavicular LA.     Musculoskeletal:        General: No tenderness. Normal range of motion.     Cervical back: Normal range of motion and neck supple. No rigidity. No muscular tenderness.     Right lower leg: No edema.     Left lower leg: No edema.     Comments: No kyphosis   Lymphadenopathy:     Cervical: No cervical adenopathy.  Skin:    General: Skin is warm and dry.     Coloration: Skin is not pale.     Findings: No erythema or rash.      Comments: Solar lentigines diffusely   Neurological:     Mental Status: She is alert. Mental status is at baseline.     Cranial Nerves: No cranial nerve deficit.     Motor: No abnormal muscle tone.     Coordination: Coordination normal.     Gait: Gait normal.     Deep Tendon Reflexes: Reflexes are normal and symmetric. Reflexes normal.  Psychiatric:        Mood and Affect: Mood normal.        Cognition and Memory: Cognition and memory normal.           Assessment & Plan:   Problem List Items Addressed This Visit  Digestive   GERD   Pepcid prn Good diet       Relevant Medications   docusate sodium  (COLACE) 100 MG capsule   PSYLLIUM HUSK PO   Lactobacillus (REPHRESH PRO-B) CAPS     Musculoskeletal and Integument   Osteopenia   Dexa 10/2022 Discussed fall prevention, supplements and exercise for bone density  No falls or fracture         Other   Routine general medical examination at a health care facility - Primary   Reviewed health habits including diet and exercise and skin cancer prevention Reviewed appropriate screening tests for age  Also reviewed health mt list, fam hx and immunization status , as well as social and family history   See HPI Labs reviewed and ordered Health Maintenance  Topic Date Due   DTaP/Tdap/Td vaccine (3 - Tdap) 12/03/2023*   Flu Shot  08/22/2023   Mammogram  06/01/2024   Medicare Annual Wellness Visit  08/11/2024   Colon Cancer Screening  10/07/2025   Pneumococcal Vaccine for age over 38  Completed   DEXA scan (bone density measurement)  Completed   Hepatitis C Screening  Completed   Hepatitis B Vaccine  Aged Out   HPV Vaccine  Aged Out   Meningitis B Vaccine  Aged Out   COVID-19 Vaccine  Discontinued   Zoster (Shingles) Vaccine  Discontinued  *Topic was postponed. The date shown is not the original due date.    Continues urogyn care  Discussed fall prevention, supplements and exercise for bone density  PHQ 2 -due  to some fatigue Labs ordered       Prediabetes   A1c ordered   disc imp of low glycemic diet and wt loss to prevent DM2       Relevant Orders   Comprehensive metabolic panel with GFR (Completed)   Hemoglobin A1c (Completed)   Lipid screening   Lipid panel today Good diet overall  Disc goals for lipids and reasons to control them Rev last labs with pt Rev low sat fat diet in detail       Relevant Orders   TSH (Completed)   Lipid Panel (Completed)   Comprehensive metabolic panel with GFR (Completed)

## 2023-08-12 NOTE — Assessment & Plan Note (Signed)
 Reviewed health habits including diet and exercise and skin cancer prevention Reviewed appropriate screening tests for age  Also reviewed health mt list, fam hx and immunization status , as well as social and family history   See HPI Labs reviewed and ordered Health Maintenance  Topic Date Due   DTaP/Tdap/Td vaccine (3 - Tdap) 12/03/2023*   Flu Shot  08/22/2023   Mammogram  06/01/2024   Medicare Annual Wellness Visit  08/11/2024   Colon Cancer Screening  10/07/2025   Pneumococcal Vaccine for age over 43  Completed   DEXA scan (bone density measurement)  Completed   Hepatitis C Screening  Completed   Hepatitis B Vaccine  Aged Out   HPV Vaccine  Aged Out   Meningitis B Vaccine  Aged Out   COVID-19 Vaccine  Discontinued   Zoster (Shingles) Vaccine  Discontinued  *Topic was postponed. The date shown is not the original due date.    Continues urogyn care  Discussed fall prevention, supplements and exercise for bone density  PHQ 2 -due to some fatigue Labs ordered

## 2023-08-12 NOTE — Patient Instructions (Addendum)
 Stay active  Add some strength training to your routine, this is important for bone and brain health and can reduce your risk of falls and help your body use insulin properly and regulate weight  Light weights, exercise bands , and internet videos are a good way to start  Yoga (chair or regular), machines , floor exercises or a gym with machines are also good options    Keep working on a healthy diet   Labs today    Take care of yourself

## 2023-08-12 NOTE — Assessment & Plan Note (Signed)
 Lipid panel today Good diet overall  Disc goals for lipids and reasons to control them Rev last labs with pt Rev low sat fat diet in detail

## 2023-08-12 NOTE — Assessment & Plan Note (Signed)
 A1c ordered  disc imp of low glycemic diet and wt loss to prevent DM2

## 2023-08-12 NOTE — Assessment & Plan Note (Signed)
 Dexa 10/2022 Discussed fall prevention, supplements and exercise for bone density  No falls or fracture

## 2023-08-13 DIAGNOSIS — E854 Organ-limited amyloidosis: Secondary | ICD-10-CM | POA: Diagnosis not present

## 2023-08-13 DIAGNOSIS — D2261 Melanocytic nevi of right upper limb, including shoulder: Secondary | ICD-10-CM | POA: Diagnosis not present

## 2023-08-13 DIAGNOSIS — L565 Disseminated superficial actinic porokeratosis (DSAP): Secondary | ICD-10-CM | POA: Diagnosis not present

## 2023-08-13 DIAGNOSIS — D2262 Melanocytic nevi of left upper limb, including shoulder: Secondary | ICD-10-CM | POA: Diagnosis not present

## 2023-08-13 DIAGNOSIS — L821 Other seborrheic keratosis: Secondary | ICD-10-CM | POA: Diagnosis not present

## 2023-08-13 DIAGNOSIS — L814 Other melanin hyperpigmentation: Secondary | ICD-10-CM | POA: Diagnosis not present

## 2023-08-13 DIAGNOSIS — D225 Melanocytic nevi of trunk: Secondary | ICD-10-CM | POA: Diagnosis not present

## 2023-08-13 DIAGNOSIS — D2271 Melanocytic nevi of right lower limb, including hip: Secondary | ICD-10-CM | POA: Diagnosis not present

## 2023-08-13 DIAGNOSIS — D2272 Melanocytic nevi of left lower limb, including hip: Secondary | ICD-10-CM | POA: Diagnosis not present

## 2023-08-13 NOTE — Progress Notes (Unsigned)
 08/15/2023 2:55 PM   Shannon [name] 08-29-1950 [id number]  Referring provider: Randeen Laine LABOR, MD [address] [place],  [place] [address]  Urological history: 1. rUTI's - CTU (2023) NED  - Vaginal estrogen cream 3 nights weekly  2. OAB - cysto (2020)  - Failed Gemtesa  - Failed Myrbetriq   3. Mixed incontinence - T VT sling (2017)   No chief complaint on file.  HPI: Shannon [name] is a 73 y.o. woman who presents today for PTNS follow up.    Previous records reviewed.   She is a non-smoker.  She does not consume alcohol.  Lab Results  Component Value Date   CREATININE 0.76 08/12/2023   Lab Results  Component Value Date   HGBA1C 6.7 (H) 08/12/2023    PMH: Past Medical History:  Diagnosis Date   Arthritis 2020   left foot   Blood transfusion without reported diagnosis    Chronic neck and back pain    received physical therapy   Dysrhythmia    Pt told she had benign arrythmia after wearing monitor   GERD (gastroesophageal reflux disease)    with esophagitis   Motion sickness    boats, planes   Numbness    Right outer thigh, constant, notices more with standing for a long period of time   Plantar fasciitis    left   PONV (postoperative nausea and vomiting)     Surgical History: Past Surgical History:  Procedure Laterality Date   ABDOMINAL HYSTERECTOMY  2017   ARTHRODESIS METATARSAL Left 12/30/2018   Procedure: ARTHRODESIS,LISFRANC;MULTIPLE LEFT;  Surgeon: Ashley Soulier, DPM;  Location: [facility];  Service: Podiatry;  Laterality: Left;  general with local   BLADDER SUSPENSION N/A 07/04/2015   Procedure: TRANSVAGINAL TAPE (TVT) SLING                   ;  Surgeon: Harland JAYSON Birkenhead, MD;  Location: [facility];  Service: Gynecology;  Laterality: N/A;   COLONOSCOPY     COLONOSCOPY WITH PROPOFOL  N/A 09/08/2019   Procedure: COLONOSCOPY WITH PROPOFOL ;  Surgeon: Janalyn Keene NOVAK, MD;  Location: [facility] END[facility]COPY;  Service: Endoscopy;   Laterality: N/A;   COLONOSCOPY WITH PROPOFOL  N/A 10/08/2022   Procedure: COLONOSCOPY WITH PROPOFOL ;  Surgeon: Unk Corinn Skiff, MD;  Location: [facility] ENDOSCOPY;  Service: Gastroenterology;  Laterality: N/A;   CYSTOCELE REPAIR N/A 07/04/2015   Procedure: ANTERIOR REPAIR (CYSTOCELE);  Surgeon: Harland JAYSON Birkenhead, MD;  Location: [facility];  Service: Gynecology;  Laterality: N/A;   CYSTOSCOPY N/A 07/04/2015   Procedure: CYSTOSCOPY;  Surgeon: Harland JAYSON Birkenhead, MD;  Location: [facility];  Service: Gynecology;  Laterality: N/A;   DILATION AND CURETTAGE OF UTERUS     EXCISION MORTON'S NEUROMA Left 08/12/2018   Procedure: EXCISION MORTON'S NEUROMA;  Surgeon: Ashley Soulier, DPM;  Location: [facility];  Service: Podiatry;  Laterality: Left;  lma local   JOINT REPLACEMENT  2023   POLYPECTOMY     POLYPECTOMY  10/08/2022   Procedure: POLYPECTOMY;  Surgeon: Unk Corinn Skiff, MD;  Location: [facility] ENDOSCOPY;  Service: Gastroenterology;;   TOTAL KNEE ARTHROPLASTY Right 08/13/2021   Procedure: RIGHT TOTAL KNEE ARTHROPLASTY;  Surgeon: Jerri Kay HERO, MD;  Location: [facility];  Service: Orthopedics;  Laterality: Right;   TUBAL LIGATION  1987   UPPER GI ENDOSCOPY     VAGINAL HYSTERECTOMY N/A 07/04/2015   Procedure: TOTAL VAGINAL HYSTERECTOMY  ;  Surgeon: Harland JAYSON Birkenhead, MD;  Location: [facility];  Service: Gynecology;  Laterality: N/A;    Home Medications:  Allergies as of 08/15/2023       Reactions   Adhesive [tape] Rash   (not sure if rash was from ointment or tape)   Alpha-gal Rash   (red meat S/P tick bite)   Neomycin-bacitracin Zn-polymyx Rash   Ointment   Nickel Rash        Medication List        Accurate as of August 13, 2023  2:55 PM. If you have any questions, ask your nurse or doctor.          CALCIUM CITRATE PO Take 500 mg by mouth daily.   cholecalciferol 25 MCG (1000 UNIT) tablet Commonly known as: VITAMIN D3 Take 1,000 Units by mouth daily.   Colace 100 MG capsule Generic drug: docusate  sodium Take 100 mg by mouth daily.   Cranberry 250-30 MG Tabs Take 2 tablets by mouth daily.   diclofenac Sodium 1 % Gel Commonly known as: VOLTAREN Apply 1 Application topically 4 (four) times daily as needed (pain).   estradiol  0.1 MG/GM vaginal cream Commonly known as: ESTRACE  Apply one pea-sized amount around the opening of the urethra daily for 2 weeks, then 3 times weekly moving forward.   famotidine 20 MG tablet Commonly known as: PEPCID Take 20 mg by mouth daily as needed for heartburn or indigestion.   Gemtesa  75 MG Tabs Generic drug: Vibegron  Take 1 tablet by mouth daily.   MAGNESIUM GLYCINATE PO Take 400 mg by mouth daily.   melatonin 5 MG Tabs Take 5 mg by mouth at bedtime.   multivitamin tablet Take 1 tablet by mouth daily.   PHAZYME GAS & ACID MAX ST PO Take 1 tablet by mouth as needed (bloating).   PSYLLIUM HUSK PO Take 3 capsules by mouth in the morning and at bedtime.   PUMPKIN SEED OIL PO Take 200 mg by mouth in the morning and at bedtime.   RepHresh Pro-B Caps Take 1 Capful by mouth daily.   ST JOHNS WORT PO Take 2 capsules by mouth in the morning and at bedtime.        Allergies:  Allergies  Allergen Reactions   Adhesive [Tape] Rash    (not sure if rash was from ointment or tape)   Alpha-Gal Rash    (red meat S/P tick bite)   Neomycin-Bacitracin Zn-Polymyx Rash    Ointment   Nickel Rash    Family History: Family History  Problem Relation Age of Onset   Stroke Mother        x 2   Heart disease Mother        congenital arrhythmia and CHF   Depression Mother    Atrial fibrillation Mother    Arthritis Mother    Vision loss Mother    Depression Father    Stomach cancer Paternal Uncle    Prostate cancer Brother    Atrial fibrillation Brother    Arthritis Maternal Aunt    ADD / ADHD Son    Birth defects Son    Intellectual disability Son    Cancer Paternal Uncle    Colon cancer Neg Hx    Esophageal cancer Neg Hx     Kidney cancer Neg Hx    Bladder Cancer Neg Hx    Breast cancer Neg Hx     Social History: See HPI for pertinent social history  ROS: Pertinent ROS in HPI  Physical Exam: There were no vitals taken for  this visit.  Constitutional:  Well nourished. Alert and oriented, No acute distress. HEENT: [place] AT, moist mucus membranes.  Trachea midline, no masses. Cardiovascular: No clubbing, cyanosis, or edema. Respiratory: Normal respiratory effort, no increased work of breathing. GU: No CVA tenderness.  No bladder fullness or masses.  Recession of labia minora, dry, pale vulvar vaginal mucosa and loss of mucosal ridges and folds.  Normal urethral meatus, no lesions, no prolapse, no discharge.   No urethral masses, tenderness and/or tenderness. No bladder fullness, tenderness or masses. *** vagina mucosa, *** estrogen effect, no discharge, no lesions, *** pelvic support, *** cystocele and *** rectocele noted.  No cervical motion tenderness.  Uterus is freely mobile and non-fixed.  No adnexal/parametria masses or tenderness noted.  Anus and perineum are without rashes or lesions.   ***  Neurologic: Grossly intact, no focal deficits, moving all 4 extremities. Psychiatric: Normal mood and affect.    Laboratory Data: See EPIC and HPI  I have reviewed the labs.   Pertinent Imaging: N/A  Assessment & Plan:  ***  1. OAB - ***  Urine cytology  No follow-ups on file.  These notes generated with voice recognition software. I apologize for typographical errors.  CLOTILDA HELON RIGGERS  [facility] [address]  [address] [place], [place] [address] [phone]

## 2023-08-14 ENCOUNTER — Encounter: Payer: Self-pay | Admitting: Obstetrics & Gynecology

## 2023-08-14 ENCOUNTER — Ambulatory Visit: Admitting: Obstetrics & Gynecology

## 2023-08-14 VITALS — BP 115/71 | HR 67 | Ht 68.0 in | Wt 173.0 lb

## 2023-08-14 DIAGNOSIS — N816 Rectocele: Secondary | ICD-10-CM

## 2023-08-14 DIAGNOSIS — Z1331 Encounter for screening for depression: Secondary | ICD-10-CM | POA: Diagnosis not present

## 2023-08-14 NOTE — Progress Notes (Signed)
 GYNECOLOGY OFFICE VISIT NOTE  History:  Shannon [name] is a 73 y.o. G2P1011 here today for evaluation of worsening rectocele.  She is s/p TVH and TVT in 2017 for pelvic organ prolapse and mixed incontinence, but had symptomatic rectocele a couple of years afterwards.  She was fitted with a #3 ring with diaphragm pessary, this has helped her symptoms, and she reported she was told to remove this every night.  Recently, she noted that her rectocele, incontinence symptoms are worse at night when her pessary is out.  She wants to know what else she can do to help her symptoms.  She denies any abnormal vaginal discharge, bleeding, pelvic pain or other concerns.  Past Medical History:  Diagnosis Date   Arthritis 2020   left foot   Blood transfusion without reported diagnosis    Chronic neck and back pain    received physical therapy   Dysrhythmia    Pt told she had benign arrythmia after wearing monitor   GERD (gastroesophageal reflux disease)    with esophagitis   Motion sickness    boats, planes   Numbness    Right outer thigh, constant, notices more with standing for a long period of time   Plantar fasciitis    left   PONV (postoperative nausea and vomiting)     Past Surgical History:  Procedure Laterality Date   ABDOMINAL HYSTERECTOMY  2017   ARTHRODESIS METATARSAL Left 12/30/2018   Procedure: ARTHRODESIS,LISFRANC;MULTIPLE LEFT;  Surgeon: Ashley Soulier, DPM;  Location: [facility];  Service: Podiatry;  Laterality: Left;  general with local   BLADDER SUSPENSION N/A 07/04/2015   Procedure: TRANSVAGINAL TAPE (TVT) SLING                   ;  Surgeon: Harland JAYSON Birkenhead, MD;  Location: [facility];  Service: Gynecology;  Laterality: N/A;   COLONOSCOPY     COLONOSCOPY WITH PROPOFOL  N/A 09/08/2019   Procedure: COLONOSCOPY WITH PROPOFOL ;  Surgeon: Janalyn Keene NOVAK, MD;  Location: [facility] ENDOSCOPY;  Service: Endoscopy;  Laterality: N/A;   COLONOSCOPY WITH PROPOFOL  N/A 10/08/2022    Procedure: COLONOSCOPY WITH PROPOFOL ;  Surgeon: Unk Corinn Skiff, MD;  Location[facility];  Service: Gastroenterology;  Laterality: N/A;   CYSTOCELE REPAIR N/A 07/04/2015   Procedure: ANTERIOR REPAIR (CYSTOCELE);  Surgeon: Harland JAYSON Birkenhead, MD;  Location: [facility];  Service: Gynecology;  Laterality: N/A;   CYSTOSCOPY N/A 07/04/2015   Procedure: CYSTOSCOPY;  Surgeon: Harland JAYSON Birkenhead, MD;  Location: [facility];  Service: Gynecology;  Laterality: N/A;   DILATION AND CURETTAGE OF UTERUS     EXCISION MORTON'S NEUROMA Left 08/12/2018   Procedure: EXCISION MORTON'S NEUROMA;  Surgeon: Ashley Soulier, DPM;  Location: [facility];  Service: Podiatry;  Laterality: Left;  lma local   JOINT REPLACEMENT  2023   POLYPECTOMY     POLYPECTOMY  10/08/2022   Procedure: POLYPECTOMY;  Surgeon: Unk Corinn Skiff, MD;  Location: [facility] ENDOSCOPY;  Service: Gastroenterology;;   TOTAL KNEE ARTHROPLASTY Right 08/13/2021   Procedure: RIGHT TOTAL KNEE ARTHROPLASTY;  Surgeon: Jerri Kay HERO, MD;  Location: [facility];  Service: Orthopedics;  Laterality: Right;   TUBAL LIGATION  1987   UPPER GI ENDOSCOPY     VAGINAL HYSTERECTOMY N/A 07/04/2015   Procedure: TOTAL VAGINAL HYSTERECTOMY  ;  Surgeon: Harland JAYSON Birkenhead, MD;  Location: [facility];  Service: Gynecology;  Laterality: N/A;   The following portions of the patient's history were reviewed and updated as  appropriate: allergies, current medications, past family history, past medical history, past social history, past surgical history and problem list.   Health Maintenance:  Normal mammogram on 06/02/2023.   Review of Systems:  Pertinent items noted in HPI and remainder of comprehensive ROS otherwise negative.  Physical Exam:  BP 115/71   Pulse 67   Ht 5' 8 (1.727 m)   Wt 173 lb (78.5 kg)   BMI 26.30 kg/m  CONSTITUTIONAL: Well-developed, well-nourished female in no acute distress.  SKIN: No rash noted. Not diaphoretic. No erythema. No pallor. MUSCULOSKELETAL: Normal range of  motion. No edema noted. NEUROLOGIC: Alert and oriented to person, place, and time. Normal muscle tone coordination. No cranial nerve deficit noted on observation. PSYCHIATRIC: Normal mood and affect. Normal behavior. Normal judgment and thought content. CARDIOVASCULAR: Normal heart rate noted RESPIRATORY: Effort and breath sounds normal, no problems with respiration noted ABDOMEN: No masses or other overt distention noted on observation. No tenderness.   PELVIC: Normal appearing external genitalia; normal urethral meatus;  vaginal mucosa with moderate trophy  No abnormal discharge noted. Pessary in place. Small rectocele noted with pessary in place, moves to Grade 1 with Valsalva/standing.  (Patient reports it comes out of her vagina and rubs against her underwear at night when her pessary is removed).  Pelvic exam performed in the presence of a chaperone   Assessment and Plan:     1. Rectocele (Primary) Given that her symptoms are improved with pessary being in place, advised her to keep this in place all day. Can remove and clean this weekly. Will reevaluate in a few weeks. If still has concerning symptoms, will refer to Urogynecology for further evaluation and management.   Routine preventative health maintenance measures emphasized. Please refer to After Visit Summary for other counseling recommendations.   Return in about 3 weeks (around 09/04/2023) for follow up as recommended.    I spent 30 minutes dedicated to the care of this patient including pre-visit review of records, face to face time with the patient discussing her conditions and treatments, post visit ordering of medications and appropriate tests or procedures, coordinating care and documenting this visit encounter.    GLORIS HUGGER, MD, FACOG Obstetrician & Gynecologist, [facility] [employer], [facility]

## 2023-08-15 ENCOUNTER — Ambulatory Visit: Admitting: Urology

## 2023-08-15 ENCOUNTER — Encounter: Payer: Self-pay | Admitting: Urology

## 2023-08-15 ENCOUNTER — Other Ambulatory Visit

## 2023-08-15 VITALS — BP 110/72 | HR 57 | Ht 68.0 in | Wt 165.0 lb

## 2023-08-15 DIAGNOSIS — N3281 Overactive bladder: Secondary | ICD-10-CM | POA: Diagnosis not present

## 2023-08-15 DIAGNOSIS — R399 Unspecified symptoms and signs involving the genitourinary system: Secondary | ICD-10-CM

## 2023-08-15 LAB — URINALYSIS, COMPLETE
Bilirubin, UA: NEGATIVE
Glucose, UA: NEGATIVE
Ketones, UA: NEGATIVE
Leukocytes,UA: NEGATIVE
Nitrite, UA: NEGATIVE
Protein,UA: NEGATIVE
RBC, UA: NEGATIVE
Specific Gravity, UA: 1.025 (ref 1.005–1.030)
Urobilinogen, Ur: 0.2 mg/dL (ref 0.2–1.0)
pH, UA: 6 (ref 5.0–7.5)

## 2023-08-15 LAB — MICROSCOPIC EXAMINATION: Epithelial Cells (non renal): >10 /HPF — AB (ref 0–10)

## 2023-08-19 DIAGNOSIS — R1032 Left lower quadrant pain: Secondary | ICD-10-CM | POA: Diagnosis not present

## 2023-08-19 DIAGNOSIS — G8929 Other chronic pain: Secondary | ICD-10-CM | POA: Diagnosis not present

## 2023-08-19 DIAGNOSIS — R198 Other specified symptoms and signs involving the digestive system and abdomen: Secondary | ICD-10-CM | POA: Diagnosis not present

## 2023-08-19 DIAGNOSIS — K5904 Chronic idiopathic constipation: Secondary | ICD-10-CM | POA: Diagnosis not present

## 2023-08-27 ENCOUNTER — Ambulatory Visit: Admitting: Urology

## 2023-09-03 ENCOUNTER — Ambulatory Visit: Admitting: Urology

## 2023-09-09 ENCOUNTER — Ambulatory Visit (INDEPENDENT_AMBULATORY_CARE_PROVIDER_SITE_OTHER): Admitting: Obstetrics & Gynecology

## 2023-09-09 VITALS — BP 133/81 | HR 61 | Wt 174.0 lb

## 2023-09-09 DIAGNOSIS — N3941 Urge incontinence: Secondary | ICD-10-CM | POA: Diagnosis not present

## 2023-09-09 DIAGNOSIS — N816 Rectocele: Secondary | ICD-10-CM

## 2023-09-09 NOTE — Progress Notes (Signed)
 GYNECOLOGY OFFICE VISIT NOTE  History:  Shannon [name] is a 73 y.o. G2P1011 here today for follow up for rectocele management.  She is s/p TVH and TVT in 2017 for pelvic organ prolapse and mixed incontinence, but had symptomatic rectocele a couple of years afterwards.  She was fitted with a #3 ring with diaphragm pessary, this has helped her symptoms but she reported she was told to remove this every night which is when her symptoms are worse.  During last visit, I advised her to keep the pessary in place all day long, to see if these helps her symptoms.  She reports that this helped the rectocele symptoms, but she still has incontinence issues. Feels that this is her urge incontinence, which has worsened since she had the TVT (which helped her stress incontinence). Already uses vaginal estrogen cream, no other medications. She is followed by a Urology team.  She wants to discuss non-surgical management, maybe a better pessary or other options.   She denies any abnormal vaginal discharge, bleeding, pelvic pain or other concerns.  Past Medical History:  Diagnosis Date   Arthritis 2020   left foot   Blood transfusion without reported diagnosis    Chronic neck and back pain    received physical therapy   Dysrhythmia    Pt told she had benign arrythmia after wearing monitor   GERD (gastroesophageal reflux disease)    with esophagitis   Motion sickness    boats, planes   Numbness    Right outer thigh, constant, notices more with standing for a long period of time   Plantar fasciitis    left   PONV (postoperative nausea and vomiting)     Past Surgical History:  Procedure Laterality Date   ABDOMINAL HYSTERECTOMY  2017   ARTHRODESIS METATARSAL Left 12/30/2018   Procedure: ARTHRODESIS,LISFRANC;MULTIPLE LEFT;  Surgeon: Ashley Soulier, DPM;  Location: [facility];  Service: Podiatry;  Laterality: Left;  general with local   BLADDER SUSPENSION N/A 07/04/2015   Procedure:  TRANSVAGINAL TAPE (TVT) SLING                   ;  Surgeon: Harland JAYSON Birkenhead, MD;  Location: [facility];  Service: Gynecology;  Laterality: N/A;   COLONOSCOPY     COLONOSCOPY WITH PROPOFOL  N/A 09/08/2019   Procedure: COLONOSCOPY WITH PROPOFOL ;  Surgeon: Janalyn Keene NOVAK, MD;  Location: [facility] ENDOSCOPY;  Service: Endoscopy;  Laterality: N/A;   COLONOSCOPY WITH PROPOFOL  N/A 10/08/2022   Procedure: COLONOSCOPY WITH PROPOFOL ;  Surgeon: Unk Corinn Skiff, MD;  Location: [facility] ENDOSCOPY;  Service: Gastroenterology;  Laterality: N/A;   CYSTOCELE REPAIR N/A 07/04/2015   Procedure: ANTERIOR REPAIR (CYSTOCELE);  Surgeon: Harland JAYSON Birkenhead, MD;  Location: [facility];  Service: Gynecology;  Laterality: N/A;   CYSTOSCOPY N/A 07/04/2015   Procedure: CYSTOSCOPY;  Surgeon: Harland JAYSON Birkenhead, MD;  Location: [facility];  Service: Gynecology;  Laterality: N/A;   DILATION AND CURETTAGE OF UTERUS     EXCISION MORTON'S NEUROMA Left 08/12/2018   Procedure: EXCISION MORTON'S NEUROMA;  Surgeon: Ashley Soulier, DPM;  Location: [facility];  Service: Podiatry;  Laterality: Left;  lma local   JOINT REPLACEMENT  2023   POLYPECTOMY     POLYPECTOMY  10/08/2022   Procedure: POLYPECTOMY;  Surgeon: Unk Corinn Skiff, MD;  Location: [facility] ENDOSCOPY;  Service: Gastroenterology;;   TOTAL KNEE ARTHROPLASTY Right 08/13/2021   Procedure: RIGHT TOTAL KNEE ARTHROPLASTY;  Surgeon: Jerri Kay HERO, MD;  Location: [facility]  OR;  Service: Orthopedics;  Laterality: Right;   TUBAL LIGATION  1987   UPPER GI ENDOSCOPY     VAGINAL HYSTERECTOMY N/A 07/04/2015   Procedure: TOTAL VAGINAL HYSTERECTOMY  ;  Surgeon: Harland JAYSON Birkenhead, MD;  Location: [facility];  Service: Gynecology;  Laterality: N/A;    The following portions of the patient's history were reviewed and updated as appropriate: allergies, current medications, past family history, past medical history, past social history, past surgical history and problem list.   Health Maintenance:   Normal mammogram on 06/02/2023.    Review of Systems:  Pertinent items noted in HPI and remainder of comprehensive ROS otherwise negative.  Physical Exam:  BP 133/81   Pulse 61   Wt 174 lb (78.9 kg)   BMI 26.46 kg/m  CONSTITUTIONAL: Well-developed, well-nourished female in no acute distress.  SKIN: No rash noted. Not diaphoretic. No erythema. No pallor. MUSCULOSKELETAL: Normal range of motion. No edema noted. NEUROLOGIC: Alert and oriented to person, place, and time. Normal muscle tone coordination. No cranial nerve deficit noted on observation. PSYCHIATRIC: Normal mood and affect. Normal behavior. Normal judgment and thought content. CARDIOVASCULAR: Normal heart rate noted RESPIRATORY: Effort and breath sounds normal, no problems with respiration noted ABDOMEN: No masses or other overt distention noted on observation. No tenderness reported. PELVIC: Deferred   Assessment and Plan:     1. Urge incontinence 2. Rectocele (Primary) Patient referred to Urogynecology for further evaluation and discussion of management options. - Ambulatory referral to Urogynecology  Return for any gynecologic concerns.    I spent 25 minutes dedicated to the care of this patient including pre-visit review of records, face to face time with the patient discussing her conditions and treatments, post visit ordering of medications and appropriate tests or procedures, coordinating care and documenting this visit encounter.    GLORIS HUGGER, MD, FACOG Obstetrician & Gynecologist, [facility] [employer], [facility]

## 2023-09-12 ENCOUNTER — Ambulatory Visit: Admitting: Urology

## 2023-09-12 VITALS — BP 114/67 | HR 79

## 2023-09-12 DIAGNOSIS — N3281 Overactive bladder: Secondary | ICD-10-CM

## 2023-09-12 NOTE — Progress Notes (Signed)
 PTNS  Session # 13  Health & Social Factors: No Change Caffeine: 0 Alcohol: 0 Daytime voids #per day: 9-14 Night-time voids #per night: 2-5 Urgency: Mild to Strong Incontinence Episodes #per day: 1-2 Ankle used: Left  Treatment Setting: 1, 2 was too strong for patient Feeling/ Response: Toe flex and Sensory Comments: Patient Tolerated Well, No Complications  Performed By: Beauford Browner, CCMA  Follow Up: 1 month follow up for PTNS 14

## 2023-09-18 DIAGNOSIS — M65311 Trigger thumb, right thumb: Secondary | ICD-10-CM | POA: Diagnosis not present

## 2023-10-17 ENCOUNTER — Ambulatory Visit: Admitting: Urology

## 2023-10-17 DIAGNOSIS — N3281 Overactive bladder: Secondary | ICD-10-CM | POA: Diagnosis not present

## 2023-10-17 NOTE — Patient Instructions (Signed)

## 2023-10-17 NOTE — Progress Notes (Signed)
 PTNS  Session # 14 - monthly maintenance  Health & Social Factors: No change Caffeine: 0 Alcohol: 0 Daytime voids #per day: 12 Night-time voids #per night: 3 Urgency: strong Incontinence Episodes #per day: 7 Ankle used: left Treatment Setting: 5 Feeling/ Response: sensory Comments: patient tolerated  Performed By: CLOTILDA CORNWALL, PA-C   Follow Up: She will follow-up in 1 month for maintenance PTNS.  She feels that she is still having excessive daytime and nighttime trips to the restroom.  She is wondering if the PTNS is effective.  She does have a history of pelvic prolapse which she is managing with a pessary at this time, but she does have a follow-up in December with a urogynecologist for further evaluation.  She would also like an appointment with Dr. Gaston to get his input on her current symptoms and if there is any other treatment options that can improve/reduce her restroom trips

## 2023-11-12 ENCOUNTER — Encounter: Payer: Self-pay | Admitting: Family Medicine

## 2023-11-12 ENCOUNTER — Ambulatory Visit: Admitting: Family Medicine

## 2023-11-12 VITALS — BP 106/68 | HR 59 | Temp 97.8°F | Ht 68.0 in | Wt 169.2 lb

## 2023-11-12 DIAGNOSIS — Z13220 Encounter for screening for lipoid disorders: Secondary | ICD-10-CM

## 2023-11-12 DIAGNOSIS — R7303 Prediabetes: Secondary | ICD-10-CM

## 2023-11-12 LAB — POCT GLYCOSYLATED HEMOGLOBIN (HGB A1C): Hemoglobin A1C: 6.3 % — AB (ref 4.0–5.6)

## 2023-11-12 NOTE — Patient Instructions (Addendum)
 Add some strength training to your routine, this is important for bone and brain health and can reduce your risk of falls and help your body use insulin properly and regulate weight  Light weights, exercise bands , and internet videos are a good way to start  Yoga (chair or regular), machines , floor exercises or a gym with machines are also good options    Avoid added sugars in your diet when you can  Try to get most of your carbohydrates from produce (with the exception of white potatoes) and whole grains Eat less bread/pasta/rice/snack foods/cereals/sweets and other items from the middle of the grocery store (processed carbs)  Keep up the good work  Your A1c is better at 6.3

## 2023-11-12 NOTE — Progress Notes (Signed)
 Subjective:    Patient ID: Shannon [name], female    DOB: [birth year], 73 y.o.   MRN: [id number]  HPI  Wt Readings from Last 3 Encounters:  11/12/23 169 lb 4 oz (76.8 kg)  09/09/23 174 lb (78.9 kg)  08/15/23 165 lb (74.8 kg)   25.73 kg/m  Vitals:   11/12/23 0850  BP: 106/68  Pulse: (!) 59  Temp: 97.8 F (36.6 C)  SpO2: 99%    Pt presents for follow up of  Prediabetes   Prediabetes 6.3 today  Lab Results  Component Value Date   HGBA1C 6.3 (A) 11/12/2023   HGBA1C 6.7 (H) 08/12/2023   HGBA1C 6.3 06/14/2022    Has been eating differently since last labs Cutting out sugar and refined carbs as much as she can  Changed to whole wheat pasta  Cauliflower rice  No sugar drinks   Has not been on any steroids   Exercise Walks a lot   Lab Results  Component Value Date   NA 142 08/12/2023   K 5.0 08/12/2023   CO2 33 (H) 08/12/2023   GLUCOSE 100 (H) 08/12/2023   BUN 25 (H) 08/12/2023   CREATININE 0.76 08/12/2023   CALCIUM 9.2 08/12/2023   GFR 76.85 08/12/2023   GFRNONAA >60 08/07/2021   Lab Results  Component Value Date   ALT 14 08/12/2023   AST 18 08/12/2023   ALKPHOS 78 08/12/2023   BILITOT 0.5 08/12/2023   Lab Results  Component Value Date   WBC 4.0 06/14/2022   HGB 13.4 06/14/2022   HCT 41.4 06/14/2022   MCV 90.8 06/14/2022   PLT 238.0 06/14/2022    Cholesterol Lab Results  Component Value Date   CHOL 198 08/12/2023   CHOL 163 06/14/2022   CHOL 169 06/01/2021   Lab Results  Component Value Date   HDL 78.30 08/12/2023   HDL 76.50 06/14/2022   HDL 79.90 06/01/2021   Lab Results  Component Value Date   LDLCALC 107 (H) 08/12/2023   LDLCALC 77 06/14/2022   LDLCALC 79 06/01/2021   Lab Results  Component Value Date   TRIG 62.0 08/12/2023   TRIG 47.0 06/14/2022   TRIG 47.0 06/01/2021   Lab Results  Component Value Date   CHOLHDL 3 08/12/2023   CHOLHDL 2 06/14/2022   CHOLHDL 2 06/01/2021   No results found for:  LDLDIRECT  The 10-year ASCVD risk score (Arnett DK, et al., 2019) is: 11.5%   Values used to calculate the score:     Age: 27 years     Clincally relevant sex: Female     Is Non-Hispanic African American: No     Diabetic: No     Tobacco smoker: No     Systolic Blood Pressure: 106 mmHg     Is BP treated: No     HDL Cholesterol: 78.3 mg/dL     Total Cholesterol: 198 mg/dL   Patient Active Problem List   Diagnosis Date Noted   Recurrent cystitis 05/05/2023   Osteopenia 11/11/2022   Rectal polyp 10/08/2022   History of colonic polyps 10/08/2022   Hearing loss due to cerumen impaction, left 08/12/2022   Lipid screening 06/13/2022   Status post total right knee replacement 08/13/2021   Primary osteoarthritis of right knee 03/28/2021   Polyp of colon    History of PSVT (paroxysmal supraventricular tachycardia) 06/17/2017   Recurrent UTI 08/23/2016   Estrogen deficiency 12/06/2015   Prediabetes 12/06/2015   Incontinence of urine in female 04/12/2015  Routine general medical examination at a health care facility 11/05/2012   PLANTAR FASCIITIS, LEFT 07/28/2009   History of colonic polyps 11/02/2007   GERD 10/20/2006   Past Medical History:  Diagnosis Date   Arthritis 2020   left foot   Blood transfusion without reported diagnosis    Chronic neck and back pain    received physical therapy   Dysrhythmia    Pt told she had benign arrythmia after wearing monitor   GERD (gastroesophageal reflux disease)    with esophagitis   Motion sickness    boats, planes   Numbness    Right outer thigh, constant, notices more with standing for a long period of time   Plantar fasciitis    left   PONV (postoperative nausea and vomiting)    Past Surgical History:  Procedure Laterality Date   ABDOMINAL HYSTERECTOMY  2017   ARTHRODESIS METATARSAL Left 12/30/2018   Procedure: ARTHRODESIS,LISFRANC;MULTIPLE LEFT;  Surgeon: Ashley Soulier, DPM;  Location: [facility];  Service:  Podiatry;  Laterality: Left;  general with local   BLADDER SUSPENSION N/A 07/04/2015   Procedure: TRANSVAGINAL TAPE (TVT) SLING                   ;  Surgeon: Harland JAYSON Birkenhead, MD;  Location: [facility];  Service: Gynecology;  Laterality: N/A;   COLONOSCOPY     COLONOSCOPY WITH PROPOFOL  N/A 09/08/2019   Procedure: COLONOSCOPY WITH PROPOFOL ;  Surgeon: Janalyn Keene NOVAK, MD;  Location: [facility];  Service: Endoscopy;  Laterality: N/A;   COLONOSCOPY WITH PROPOFOL  N/A 10/08/2022   Procedure: COLONOSCOPY WITH PROPOFOL ;  Surgeon: Unk Corinn Skiff, MD;  Location: [facility];  Service: Gastroenterology;  Laterality: N/A;   CYSTOCELE REPAIR N/A 07/04/2015   Procedure: ANTERIOR REPAIR (CYSTOCELE);  Surgeon: Harland JAYSON Birkenhead, MD;  Location: [facility];  Service: Gynecology;  Laterality: N/A;   CYSTOSCOPY N/A 07/04/2015   Procedure: CYSTOSCOPY;  Surgeon: Harland JAYSON Birkenhead, MD;  Location: [facility];  Service: Gynecology;  Laterality: N/A;   DILATION AND CURETTAGE OF UTERUS     EXCISION MORTON'S NEUROMA Left 08/12/2018   Procedure: EXCISION MORTON'S NEUROMA;  Surgeon: Ashley Soulier, DPM;  Location: [facility];  Service: Podiatry;  Laterality: Left;  lma local   JOINT REPLACEMENT  2023   POLYPECTOMY     POLYPECTOMY  10/08/2022   Procedure: POLYPECTOMY;  Surgeon: Unk Corinn Skiff, MD;  Location: [facility];  Service: Gastroenterology;;   TOTAL KNEE ARTHROPLASTY Right 08/13/2021   Procedure: RIGHT TOTAL KNEE ARTHROPLASTY;  Surgeon: Jerri Kay HERO, MD;  Location: [facility];  Service: Orthopedics;  Laterality: Right;   TUBAL LIGATION  1987   UPPER GI ENDOSCOPY     VAGINAL HYSTERECTOMY N/A 07/04/2015   Procedure: TOTAL VAGINAL HYSTERECTOMY  ;  Surgeon: Harland JAYSON Birkenhead, MD;  Location: [facility];  Service: Gynecology;  Laterality: N/A;   Social History   Tobacco Use   Smoking status: Never   Smokeless tobacco: Never  Vaping Use   Vaping status: Never Used  Substance Use Topics   Alcohol use: No    Alcohol/week:  0.0 standard drinks of alcohol    Comment: rare   Drug use: No   Family History  Problem Relation Age of Onset   Stroke Mother        x 2   Heart disease Mother        congenital arrhythmia and CHF   Depression Mother    Atrial fibrillation Mother  Arthritis Mother    Vision loss Mother    Depression Father    Stomach cancer Paternal Uncle    Prostate cancer Brother    Atrial fibrillation Brother    Arthritis Maternal Aunt    ADD / ADHD Son    Birth defects Son    Intellectual disability Son    Cancer Paternal Uncle    Colon cancer Neg Hx    Esophageal cancer Neg Hx    Kidney cancer Neg Hx    Bladder Cancer Neg Hx    Breast cancer Neg Hx    Allergies  Allergen Reactions   Adhesive [Tape] Rash    (not sure if rash was from ointment or tape)   Alpha-Gal Rash    (red meat S/P tick bite)   Neomycin-Bacitracin Zn-Polymyx Rash    Ointment   Nickel Rash   Current Outpatient Medications on File Prior to Visit  Medication Sig Dispense Refill   Calcium Carbonate-Simethicone (PHAZYME GAS & ACID MAX ST PO) Take 1 tablet by mouth as needed (bloating).     CALCIUM CITRATE PO Take 500 mg by mouth daily.     cholecalciferol (VITAMIN D3) 25 MCG (1000 UNIT) tablet Take 1,000 Units by mouth daily.     Cranberry 250-30 MG TABS Take 2 tablets by mouth daily.     diclofenac Sodium (VOLTAREN) 1 % GEL Apply 1 Application topically 4 (four) times daily as needed (pain).     estradiol  (ESTRACE ) 0.1 MG/GM vaginal cream Apply one pea-sized amount around the opening of the urethra daily for 2 weeks, then 3 times weekly moving forward. 42.5 g 12   famotidine (PEPCID) 20 MG tablet Take 20 mg by mouth daily as needed for heartburn or indigestion.     GEMTESA  75 MG TABS Take 1 tablet by mouth daily.     Lactobacillus (REPHRESH PRO-B) CAPS Take 1 Capful by mouth daily.     MAGNESIUM GLYCINATE PO Take 400 mg by mouth daily.     melatonin 5 MG TABS Take 5 mg by mouth at bedtime.     Misc Natural  Products (PUMPKIN SEED OIL PO) Take 200 mg by mouth in the morning and at bedtime.     Multiple Vitamin (MULTIVITAMIN) tablet Take 1 tablet by mouth daily.     PSYLLIUM HUSK PO Take 3 capsules by mouth in the morning and at bedtime.     ST JOHNS WORT PO Take 2 capsules by mouth in the morning and at bedtime.     No current facility-administered medications on file prior to visit.    Review of Systems  Constitutional:  Negative for activity change, appetite change, fatigue, fever and unexpected weight change.  HENT:  Negative for congestion, ear pain, rhinorrhea, sinus pressure and sore throat.   Eyes:  Negative for pain, redness and visual disturbance.  Respiratory:  Negative for cough, shortness of breath and wheezing.   Cardiovascular:  Negative for chest pain and palpitations.  Gastrointestinal:  Negative for abdominal pain, blood in stool, constipation and diarrhea.  Endocrine: Negative for polydipsia and polyuria.  Genitourinary:  Negative for dysuria, frequency and urgency.  Musculoskeletal:  Negative for arthralgias, back pain and myalgias.       Some shoulder/neck and knee problems prevent certain exercises   Skin:  Negative for pallor and rash.  Allergic/Immunologic: Negative for environmental allergies.  Neurological:  Negative for dizziness, syncope and headaches.  Hematological:  Negative for adenopathy. Does not bruise/bleed easily.  Psychiatric/Behavioral:  Negative  for decreased concentration and dysphoric mood. The patient is not nervous/anxious.        Objective:   Physical Exam Constitutional:      General: She is not in acute distress.    Appearance: She is well-developed and normal weight. She is not ill-appearing or diaphoretic.  HENT:     Head: Normocephalic and atraumatic.  Eyes:     Conjunctiva/sclera: Conjunctivae normal.     Pupils: Pupils are equal, round, and reactive to light.  Neck:     Thyroid : No thyromegaly.     Vascular: No carotid bruit or  JVD.  Cardiovascular:     Rate and Rhythm: Normal rate and regular rhythm.     Heart sounds: Normal heart sounds.     No gallop.  Pulmonary:     Effort: Pulmonary effort is normal. No respiratory distress.     Breath sounds: Normal breath sounds. No wheezing or rales.  Abdominal:     General: There is no distension or abdominal bruit.     Palpations: Abdomen is soft.  Musculoskeletal:     Cervical back: Normal range of motion and neck supple.     Right lower leg: No edema.     Left lower leg: No edema.  Lymphadenopathy:     Cervical: No cervical adenopathy.  Skin:    General: Skin is warm and dry.     Coloration: Skin is not pale.     Findings: No rash.  Neurological:     Mental Status: She is alert.     Coordination: Coordination normal.     Deep Tendon Reflexes: Reflexes are normal and symmetric. Reflexes normal.  Psychiatric:        Mood and Affect: Mood normal.           Assessment & Plan:   Problem List Items Addressed This Visit       Other   Prediabetes - Primary   Improved with better (low glycemic) diet  Lab Results  Component Value Date   HGBA1C 6.3 (A) 11/12/2023   HGBA1C 6.7 (H) 08/12/2023   HGBA1C 6.3 06/14/2022   Eating less added sugar and refined carbs Encouraged to keep up the good work Also add more strength/muscle building exercise  Will continue to follow         Relevant Orders   POCT HgB A1C (Completed)   Lipid screening   Disc goals for lipids and reasons to control them Rev last labs with pt Rev low sat fat diet in detail High HDL in 70s LDL 107-expect improvement in future with better diet  Ratio of 3

## 2023-11-12 NOTE — Assessment & Plan Note (Signed)
 Disc goals for lipids and reasons to control them Rev last labs with pt Rev low sat fat diet in detail High HDL in 70s LDL 107-expect improvement in future with better diet  Ratio of 3

## 2023-11-12 NOTE — Assessment & Plan Note (Signed)
 Improved with better (low glycemic) diet  Lab Results  Component Value Date   HGBA1C 6.3 (A) 11/12/2023   HGBA1C 6.7 (H) 08/12/2023   HGBA1C 6.3 06/14/2022   Eating less added sugar and refined carbs Encouraged to keep up the good work Also add more strength/muscle building exercise  Will continue to follow

## 2023-11-14 ENCOUNTER — Ambulatory Visit: Admitting: Urology

## 2023-11-14 VITALS — BP 106/68 | HR 69 | Wt 163.0 lb

## 2023-11-14 DIAGNOSIS — N3281 Overactive bladder: Secondary | ICD-10-CM

## 2023-11-14 NOTE — Progress Notes (Signed)
 PTNS  Session # Monthly Maintenance   Health & Social Factors: No change  Caffeine: 0 Alcohol: 0 Daytime voids #per day: 8-12 times Night-time voids #per night: 3 times Urgency: mild to moderate  Incontinence Episodes #per day: 2 times per week  Ankle used: left  Treatment Setting: 9 Feeling/ Response: sensory  Comments: Patient tolerated   Performed By: CLOTILDA CORNWALL, PA-C   Follow Up: One month for maintenance PTNS

## 2023-11-15 ENCOUNTER — Other Ambulatory Visit: Payer: Self-pay | Admitting: Urology

## 2023-11-24 ENCOUNTER — Encounter: Payer: Self-pay | Admitting: Radiology

## 2023-12-15 ENCOUNTER — Ambulatory Visit: Admitting: Urology

## 2023-12-15 VITALS — BP 102/67 | HR 68 | Wt 165.0 lb

## 2023-12-15 DIAGNOSIS — N3281 Overactive bladder: Secondary | ICD-10-CM

## 2023-12-15 NOTE — Progress Notes (Signed)
 PTNS  Session # Monthly Maintenance- 16  Health & Social Factors: No change  Caffeine: 0 Alcohol: 0 Daytime voids #per day: 10-12 Night-time voids #per night: 3x Urgency: variable from mild to strong Incontinence Episodes #per day: 1 x Ankle used: Left Treatment Setting: 8 Feeling/ Response: Sensory  Comments: Patient tolerated  Performed By: CLOTILDA CORNWALL, PA-C   Follow Up:  Monthly maintenance in one month

## 2023-12-22 ENCOUNTER — Ambulatory Visit: Admitting: Urology

## 2023-12-29 ENCOUNTER — Encounter: Payer: Self-pay | Admitting: Obstetrics and Gynecology

## 2023-12-29 ENCOUNTER — Ambulatory Visit: Admitting: Obstetrics and Gynecology

## 2023-12-29 VITALS — BP 123/74 | HR 82 | Ht 68.11 in | Wt 169.8 lb

## 2023-12-29 DIAGNOSIS — Z975 Presence of (intrauterine) contraceptive device: Secondary | ICD-10-CM | POA: Diagnosis not present

## 2023-12-29 DIAGNOSIS — K5904 Chronic idiopathic constipation: Secondary | ICD-10-CM

## 2023-12-29 DIAGNOSIS — N3281 Overactive bladder: Secondary | ICD-10-CM

## 2023-12-29 DIAGNOSIS — R350 Frequency of micturition: Secondary | ICD-10-CM

## 2023-12-29 DIAGNOSIS — N993 Prolapse of vaginal vault after hysterectomy: Secondary | ICD-10-CM | POA: Diagnosis not present

## 2023-12-29 LAB — POCT URINALYSIS DIP (CLINITEK)
Bilirubin, UA: NEGATIVE
Blood, UA: NEGATIVE
Glucose, UA: NEGATIVE mg/dL
Ketones, POC UA: NEGATIVE mg/dL
Leukocytes, UA: NEGATIVE
Nitrite, UA: NEGATIVE
POC PROTEIN,UA: NEGATIVE
Spec Grav, UA: 1.025 (ref 1.010–1.025)
Urobilinogen, UA: 0.2 U/dL
pH, UA: 5.5 (ref 5.0–8.0)

## 2023-12-29 NOTE — Progress Notes (Signed)
 New Patient Evaluation and Consultation  Referring Provider: Herchel Gloris LABOR, MD PCP: Randeen Laine LABOR, MD Date of Service: 12/29/2023  SUBJECTIVE Chief Complaint: New Patient (Initial Visit) Jesusa Ward Simson is a 73 y.o. female here today for urinary incontinence )  History of Present Illness: Riona Lahti is a 73 y.o. White or Caucasian female seen in consultation at the request of Dr Herchel for evaluation of prolapse and incontinence.      Urinary Symptoms: Leaks urine with with a full bladder, with movement to the bathroom, and with urgency Leaks 0-2 time(s) per day.  Pad use: 1-2 pads per day.   Patient is bothered by UI symptoms. Has a history of a sling in 2017-no longer has  Seen by [facility] in [place]. Currently on Gemtesa  and did a course of PTNS. She had some benefit from PTNS and does monthly maintenance. But does not feel symptoms are well managed overall. Also tried Myrbetriq  but not much benefit.   Day time voids 10-12.  Nocturia: 3 times per night to void. Voiding dysfunction:  empties bladder well.  Patient does not use a catheter to empty bladder.  When urinating, patient feels she has no difficulties Drinks: 6-8 glasses water  per day  UTIs: 2 UTI's in the last year.   Denies history of blood in urine and kidney or bladder stones  Pelvic Organ Prolapse Symptoms:                  Patient Admits to a feeling of a bulge the vaginal area. It has been present for a few years.  Patient Admits to seeing a bulge.  This bulge is bothersome. History of TVH and anterior repair in 2017 but she reports prolapse came back after about a year.  She has a ring pessary and removes it once a week. Overall pessary works well but sometimes feels it falling when she strains with a BM.  She is using vaginal estrogen cream three times per week  Bowel Symptom: Bowel movements: 1 time(s) per day Stool consistency: soft , sometimes has pebbles Straining: no.   Splinting: no.  Incomplete evacuation: no.  Patient Admits to accidental bowel leakage / fecal incontinence  Occurs: rare  Consistency with leakage: solid- small pebbles Bowel regimen: diet, fiber, and stool softener- psyllum and softener daily. Previously took miralax and works too well and causes gas  HM Colonoscopy          Upcoming     Colonoscopy (Every 3 Years) Next due on 10/07/2025    10/08/2022  COLONOSCOPY   Only the first 1 history entries have been loaded, but more history exists.                Sexual Function Sexually active: no.  Sexual orientation: Straight Pain with sex: No  Pelvic Pain Denies pelvic pain   Past Medical History:  Past Medical History:  Diagnosis Date   Arthritis 2020   left foot   Blood transfusion without reported diagnosis    Chronic neck and back pain    received physical therapy   Dysrhythmia    Pt told she had benign arrythmia after wearing monitor   GERD (gastroesophageal reflux disease)    with esophagitis   Motion sickness    boats, planes   Numbness    Right outer thigh, constant, notices more with standing for a long period of time   Plantar fasciitis    left   PONV (postoperative nausea and  vomiting)      Past Surgical History:   Past Surgical History:  Procedure Laterality Date   ARTHRODESIS METATARSAL Left 12/30/2018   Procedure: ARTHRODESIS,LISFRANC;MULTIPLE LEFT;  Surgeon: Ashley Soulier, DPM;  Location: [facility];  Service: Podiatry;  Laterality: Left;  general with local   BLADDER SUSPENSION N/A 07/04/2015   Procedure: TRANSVAGINAL TAPE (TVT) SLING                   ;  Surgeon: Harland JAYSON Birkenhead, MD;  Location: [facility];  Service: Gynecology;  Laterality: N/A;   COLONOSCOPY     COLONOSCOPY WITH PROPOFOL  N/A 09/08/2019   Procedure: COLONOSCOPY WITH PROPOFOL ;  Surgeon: Janalyn Keene NOVAK, MD;  Location: [facility];  Service: Endoscopy;  Laterality: N/A;   COLONOSCOPY WITH PROPOFOL  N/A  10/08/2022   Procedure: COLONOSCOPY WITH PROPOFOL ;  Surgeon: Unk Corinn Skiff, MD;  Location: [facility];  Service: Gastroenterology;  Laterality: N/A;   CYSTOCELE REPAIR N/A 07/04/2015   Procedure: ANTERIOR REPAIR (CYSTOCELE);  Surgeon: Harland JAYSON Birkenhead, MD;  Location: [facility];  Service: Gynecology;  Laterality: N/A;   CYSTOSCOPY N/A 07/04/2015   Procedure: CYSTOSCOPY;  Surgeon: Harland JAYSON Birkenhead, MD;  Location: [facility];  Service: Gynecology;  Laterality: N/A;   DILATION AND CURETTAGE OF UTERUS     EXCISION MORTON'S NEUROMA Left 08/12/2018   Procedure: EXCISION MORTON'S NEUROMA;  Surgeon: Ashley Soulier, DPM;  Location: [facility];  Service: Podiatry;  Laterality: Left;  lma local   JOINT REPLACEMENT  2023   POLYPECTOMY     POLYPECTOMY  10/08/2022   Procedure: POLYPECTOMY;  Surgeon: Unk Corinn Skiff, MD;  Location: [facility];  Service: Gastroenterology;;   TOTAL KNEE ARTHROPLASTY Right 08/13/2021   Procedure: RIGHT TOTAL KNEE ARTHROPLASTY;  Surgeon: Jerri Kay HERO, MD;  Location: [facility];  Service: Orthopedics;  Laterality: Right;   TUBAL LIGATION  1987   UPPER GI ENDOSCOPY     VAGINAL HYSTERECTOMY N/A 07/04/2015   Procedure: TOTAL VAGINAL HYSTERECTOMY  ;  Surgeon: Harland JAYSON Birkenhead, MD;  Location: [facility];  Service: Gynecology;  Laterality: N/A;     Past OB/GYN History: OB History  Gravida Para Term Preterm AB Living  2 1 1  1 1   SAB IAB Ectopic Multiple Live Births  1    1    # Outcome Date GA Lbr Len/2nd Weight Sex Type Anes PTL Lv  2 Term 12/16/80 [redacted]w[redacted]d  7 lb (3.175 kg) M    LIV  1 SAB 1978 [redacted]w[redacted]d    Vag-Spont   FD    S/p hysterectomy  Medications: Patient has a current medication list which includes the following prescription(s): calcium carbonate-simethicone, calcium citrate, cholecalciferol, cranberry, diclofenac sodium, estradiol , famotidine, rephresh pro-b, magnesium glycinate, melatonin, misc natural products, multivitamin, psyllium, st johns wort, and gemtesa .    Allergies: Patient is allergic to adhesive [tape], alpha-gal, neomycin-bacitracin zn-polymyx, and nickel.   Social History:  Social History   Tobacco Use   Smoking status: Never   Smokeless tobacco: Never  Vaping Use   Vaping status: Never Used  Substance Use Topics   Alcohol use: No    Alcohol/week: 0.0 standard drinks of alcohol    Comment: rare   Drug use: No    Relationship status: married Patient lives with husband and cat.   Patient is not employed . Regular exercise: Yes: walking History of abuse: No  Family History:   Family History  Problem Relation Age of Onset   Stroke Mother  x 2   Heart disease Mother        congenital arrhythmia and CHF   Depression Mother    Atrial fibrillation Mother    Arthritis Mother    Vision loss Mother    Depression Father    Prostate cancer Brother    Atrial fibrillation Brother    ADD / ADHD Son    Birth defects Son    Intellectual disability Son    Arthritis Maternal Aunt    Stomach cancer Paternal Uncle    Cancer Paternal Uncle    Colon cancer Neg Hx    Esophageal cancer Neg Hx    Kidney cancer Neg Hx    Bladder Cancer Neg Hx    Breast cancer Neg Hx    Renal cancer Neg Hx    Uterine cancer Neg Hx      Review of Systems: Review of Systems  Constitutional:  Negative for fever, malaise/fatigue and weight loss.  Respiratory:  Negative for cough, shortness of breath and wheezing.   Cardiovascular:  Negative for chest pain, palpitations and leg swelling.  Gastrointestinal:  Positive for abdominal pain. Negative for blood in stool.  Genitourinary:  Negative for dysuria.  Musculoskeletal:  Negative for myalgias.  Skin:  Negative for rash.  Neurological:  Negative for dizziness and headaches.  Endo/Heme/Allergies:  Does not bruise/bleed easily.  Psychiatric/Behavioral:  Negative for depression. The patient is not nervous/anxious.      OBJECTIVE Physical Exam: Vitals:   12/29/23 0909  BP: 123/74  Pulse:  82  Weight: 169 lb 12.8 oz (77 kg)  Height: 5' 8.11 (1.73 m)    Physical Exam Vitals reviewed. Exam conducted with a chaperone present.  Constitutional:      General: She is not in acute distress. Pulmonary:     Effort: Pulmonary effort is normal.  Abdominal:     General: There is no distension.     Palpations: Abdomen is soft.     Tenderness: There is no abdominal tenderness. There is no rebound.  Musculoskeletal:        General: No swelling. Normal range of motion.  Skin:    General: Skin is warm and dry.     Findings: No rash.  Neurological:     Mental Status: She is alert and oriented to person, place, and time.  Psychiatric:        Mood and Affect: Mood normal.        Behavior: Behavior normal.      GU / Detailed Urogynecologic Evaluation:  Pelvic Exam: Normal external female genitalia; Bartholin's and Skene's glands normal in appearance; urethral meatus normal in appearance, no urethral masses or discharge.   CST: negative  Pessary removed and cleaned.  s/p hysterectomy: Speculum exam reveals normal vaginal mucosa with  atrophy and normal vaginal cuff.  Adnexa normal adnexa.  Pessary replaced  Pelvic floor strength I/V, puborectalis I/V external anal sphincter I/V  Pelvic floor musculature: Right levator non-tender, Right obturator non-tender, Left levator non-tender, Left obturator non-tender  POP-Q:   POP-Q  -2.5                                            Aa   -2.5  Ba  -5.5                                              C   3.5                                            Gh  4.5                                            Pb  7                                            tvl   0                                            Ap  0                                            Bp                                                 D      Rectal Exam:  Normal sphincter tone, small distal rectocele, enterocoele  present, no rectal masses, no sign of dyssynergia when asking the patient to bear down.  Post-Void Residual (PVR) by Bladder Scan: In order to evaluate bladder emptying, we discussed obtaining a postvoid residual and patient agreed to this procedure.  Procedure: The ultrasound unit was placed on the patient's abdomen in the suprapubic region after the patient had voided.    Post Void Residual - 12/29/23 0919       Post Void Residual   Post Void Residual 1 mL           Laboratory Results: Lab Results  Component Value Date   COLORU yellow 12/29/2023   CLARITYU clear 12/29/2023   GLUCOSEUR negative 12/29/2023   BILIRUBINUR negative 12/29/2023   KETONESU Negative 08/15/2023   SPECGRAV 1.025 12/29/2023   RBCUR negative 12/29/2023   PHUR 5.5 12/29/2023   PROTEINUR Negative 08/15/2023   UROBILINOGEN 0.2 12/29/2023   LEUKOCYTESUR Negative 12/29/2023    Lab Results  Component Value Date   CREATININE 0.76 08/12/2023   CREATININE 0.76 06/14/2022   CREATININE 0.80 10/16/2021    Lab Results  Component Value Date   HGBA1C 6.3 (A) 11/12/2023    Lab Results  Component Value Date   HGB 13.4 06/14/2022     ASSESSMENT AND PLAN Ms. Royse is a 73 y.o. with:  1. Vaginal vault prolapse after hysterectomy   2. Overactive bladder   3. Urinary frequency   4. Chronic idiopathic constipation     Vaginal vault  prolapse after hysterectomy Assessment & Plan: Stage I anterior, Stage II posterior, Stage I apical prolapse - For treatment of pelvic organ prolapse, we discussed options for management including expectant management, conservative management, and surgical management, such as Kegels, a pessary, pelvic floor physical therapy, and specific surgical procedures (posterior repair with SSLF). - She is not interested in surgery at this time. I feel she her pessary is working well for supporting her prolapse so I would not make any changes. We discussed that another pessary is not  likely to improve OAB or constipation symptoms.  She will continue to remove it once a week. Return for pessary checks q6 months.    Overactive bladder Assessment & Plan: - We discussed the symptoms of overactive bladder (OAB), which include urinary urgency, urinary frequency, nocturia, with or without urge incontinence.  While we do not know the exact etiology of OAB, several treatment options exist. We discussed management including behavioral therapy (decreasing bladder irritants, urge suppression strategies, timed voids, bladder retraining), physical therapy, medication; for refractory cases posterior tibial nerve stimulation, sacral neuromodulation, and intravesical botulinum toxin injection.  - For refractory OAB we reviewed the procedure for intravesical Botox injection with cystoscopy in the office and reviewed the risks, benefits and alternatives of treatment including but not limited to infection, need for self-catheterization and need for repeat therapy.  We discussed that there is a 5-15% chance of needing to catheterize with Botox and that this usually resolves in a few months; however can persist for longer periods of time.  Typically Botox injections would need to be repeated every 3-12 months since this is not a permanent therapy.  - We discussed the role of sacral neuromodulation and how it works. It requires a test phase, and documentation of bladder function via diary. After a successful test period, a permanent wire and generator are placed in the OR. The battery lasts 5 years on average and would need to be replaced surgically.  The goal of this therapy is at least a 50% improvement in symptoms. It is NOT realistic to expect a 100% cure.  We reviewed the fact that about 30% of patients fail the test phase and are not candidates for permanent generator placement.   - Handouts provided for SNM and botox. We also reviewed the option of implantable tibial nerve stim but she does not feel  that she has had enough of a benefit with PTNS that she would want to pursue that. She has an appt with Dr Jacqlyn at [facility] to discuss OAB options.    Urinary frequency -     POCT URINALYSIS DIP (CLINITEK)  Chronic idiopathic constipation Assessment & Plan: - Has been doing well overall with daily psyllium and colace.  - Recommended adding small dose of miralax if she notices harder stools.     Return 6 months   Rosaline LOISE Caper, MD

## 2023-12-29 NOTE — Assessment & Plan Note (Signed)
-   Has been doing well overall with daily psyllium and colace.  - Recommended adding small dose of miralax if she notices harder stools.

## 2023-12-29 NOTE — Assessment & Plan Note (Signed)
 Stage I anterior, Stage II posterior, Stage I apical prolapse - For treatment of pelvic organ prolapse, we discussed options for management including expectant management, conservative management, and surgical management, such as Kegels, a pessary, pelvic floor physical therapy, and specific surgical procedures (posterior repair with SSLF). - She is not interested in surgery at this time. I feel she her pessary is working well for supporting her prolapse so I would not make any changes. We discussed that another pessary is not likely to improve OAB or constipation symptoms.  She will continue to remove it once a week. Return for pessary checks q6 months.

## 2023-12-29 NOTE — Assessment & Plan Note (Addendum)
-   We discussed the symptoms of overactive bladder (OAB), which include urinary urgency, urinary frequency, nocturia, with or without urge incontinence.  While we do not know the exact etiology of OAB, several treatment options exist. We discussed management including behavioral therapy (decreasing bladder irritants, urge suppression strategies, timed voids, bladder retraining), physical therapy, medication; for refractory cases posterior tibial nerve stimulation, sacral neuromodulation, and intravesical botulinum toxin injection.  - For refractory OAB we reviewed the procedure for intravesical Botox injection with cystoscopy in the office and reviewed the risks, benefits and alternatives of treatment including but not limited to infection, need for self-catheterization and need for repeat therapy.  We discussed that there is a 5-15% chance of needing to catheterize with Botox and that this usually resolves in a few months; however can persist for longer periods of time.  Typically Botox injections would need to be repeated every 3-12 months since this is not a permanent therapy.  - We discussed the role of sacral neuromodulation and how it works. It requires a test phase, and documentation of bladder function via diary. After a successful test period, a permanent wire and generator are placed in the OR. The battery lasts 5 years on average and would need to be replaced surgically.  The goal of this therapy is at least a 50% improvement in symptoms. It is NOT realistic to expect a 100% cure.  We reviewed the fact that about 30% of patients fail the test phase and are not candidates for permanent generator placement.   - Handouts provided for SNM and botox. We also reviewed the option of implantable tibial nerve stim but she does not feel that she has had enough of a benefit with PTNS that she would want to pursue that. She has an appt with Dr Jacqlyn at [facility] to discuss OAB options.

## 2023-12-29 NOTE — Patient Instructions (Signed)
 You have a stage 2 (out of 4) prolapse.  We discussed the fact that it is not life threatening but there are several treatment options. For treatment of pelvic organ prolapse, we discussed options for management including expectant management, conservative management, and surgical management, such as Kegels, a pessary, pelvic floor physical therapy, and specific surgical procedures.     We discussed the symptoms of overactive bladder (OAB), which include urinary urgency, urinary frequency, nocturia, with or without urge incontinence.  While we do not know the exact etiology of OAB, several treatment options exist. We discussed management including behavioral therapy (decreasing bladder irritants, urge suppression strategies, timed voids, bladder retraining), physical therapy, medication; for refractory cases posterior tibial nerve stimulation, sacral neuromodulation, and intravesical botulinum toxin injection.

## 2024-01-05 ENCOUNTER — Ambulatory Visit: Admitting: Urology

## 2024-01-05 VITALS — BP 126/70 | HR 74 | Ht 68.0 in | Wt 165.0 lb

## 2024-01-05 DIAGNOSIS — N3281 Overactive bladder: Secondary | ICD-10-CM | POA: Diagnosis not present

## 2024-01-05 MED ORDER — GEMTESA 75 MG PO TABS: 1.0000 | ORAL_TABLET | Freq: Every day | ORAL | 3 refills | Status: AC

## 2024-01-05 NOTE — Progress Notes (Signed)
 01/05/2024 8:30 AM   Shannon [name] 1950-09-03 [id number]  Referring provider: Randeen Laine LABOR, MD [address] [place],  [place] [address]  No chief complaint on file.   HPI: Penne: OAB; myrbetriq ; recurrent UTI/estrogen/PTNS    Today Patient currently has urge incontinence with foot on the floor syndrome in the middle of the night.  She wears 1-2 pads a day that can be damp.  She has no bedwetting.  No stress incontinence.  She had a sling with a hysterectomy years ago and no longer has stress incontinence but the urgency got worse.  She currently is on Gemtesa   Her flow was reasonable.  She voids approximately every 1-2 hours gets up 3 times at night.  She had a negative cystoscopy in 2020.  She had 2 bladder infections in the last year and uses estrogen cream 3 times a week  No neurologic issues     PMH: Past Medical History:  Diagnosis Date   Arthritis 2020   left foot   Blood transfusion without reported diagnosis    Chronic neck and back pain    received physical therapy   Dysrhythmia    Pt told she had benign arrythmia after wearing monitor   GERD (gastroesophageal reflux disease)    with esophagitis   Motion sickness    boats, planes   Numbness    Right outer thigh, constant, notices more with standing for a long period of time   Plantar fasciitis    left   PONV (postoperative nausea and vomiting)     Surgical History: Past Surgical History:  Procedure Laterality Date   ARTHRODESIS METATARSAL Left 12/30/2018   Procedure: ARTHRODESIS,LISFRANC;MULTIPLE LEFT;  Surgeon: Ashley Soulier, DPM;  Location: [facility];  Service: Podiatry;  Laterality: Left;  general with local   BLADDER SUSPENSION N/A 07/04/2015   Procedure: TRANSVAGINAL TAPE (TVT) SLING                   ;  Surgeon: Harland JAYSON Birkenhead, MD;  Location: [facility];  Service: Gynecology;  Laterality: N/A;   COLONOSCOPY     COLONOSCOPY WITH PROPOFOL  N/A 09/08/2019   Procedure:  COLONOSCOPY WITH PROPOFOL ;  Surgeon: Janalyn Keene NOVAK, MD;  Location: [facility];  Service: Endoscopy;  Laterality: N/A;   COLONOSCOPY WITH PROPOFOL  N/A 10/08/2022   Procedure: COLONOSCOPY WITH PROPOFOL ;  Surgeon: Unk Corinn Skiff, MD;  Location[facility];  Service: Gastroenterology;  Laterality: N/A;   CYSTOCELE REPAIR N/A 07/04/2015   Procedure: ANTERIOR REPAIR (CYSTOCELE);  Surgeon: Harland JAYSON Birkenhead, MD;  Location: [facility];  Service: Gynecology;  Laterality: N/A;   CYSTOSCOPY N/A 07/04/2015   Procedure: CYSTOSCOPY;  Surgeon: Harland JAYSON Birkenhead, MD;  Location: [facility];  Service: Gynecology;  Laterality: N/A;   DILATION AND CURETTAGE OF UTERUS     EXCISION MORTON'S NEUROMA Left 08/12/2018   Procedure: EXCISION MORTON'S NEUROMA;  Surgeon: Ashley Soulier, DPM;  Location: [facility];  Service: Podiatry;  Laterality: Left;  lma local   JOINT REPLACEMENT  2023   POLYPECTOMY     POLYPECTOMY  10/08/2022   Procedure: POLYPECTOMY;  Surgeon: Unk Corinn Skiff, MD;  Location: [facility];  Service: Gastroenterology;;   TOTAL KNEE ARTHROPLASTY Right 08/13/2021   Procedure: RIGHT TOTAL KNEE ARTHROPLASTY;  Surgeon: Jerri Kay HERO, MD;  Location: [facility];  Service: Orthopedics;  Laterality: Right;   TUBAL LIGATION  1987   UPPER GI ENDOSCOPY     VAGINAL HYSTERECTOMY N/A 07/04/2015  Procedure: TOTAL VAGINAL HYSTERECTOMY  ;  Surgeon: Harland JAYSON Birkenhead, MD;  Location: [facility];  Service: Gynecology;  Laterality: N/A;    Home Medications:  Allergies as of 01/05/2024       Reactions   Adhesive [tape] Rash   (not sure if rash was from ointment or tape)   Alpha-gal Rash   (red meat S/P tick bite)   Neomycin-bacitracin Zn-polymyx Rash   Ointment   Nickel Rash        Medication List        Accurate as of January 05, 2024  8:30 AM. If you have any questions, ask your nurse or doctor.          CALCIUM CITRATE PO Take 500 mg by mouth daily.   cholecalciferol 25 MCG (1000 UNIT)  tablet Commonly known as: VITAMIN D3 Take 1,000 Units by mouth daily.   Cranberry 250-30 MG Tabs Take 2 tablets by mouth daily.   diclofenac Sodium 1 % Gel Commonly known as: VOLTAREN Apply 1 Application topically 4 (four) times daily as needed (pain).   estradiol  0.1 MG/GM vaginal cream Commonly known as: ESTRACE  Apply one pea-sized amount around the opening of the urethra daily for 2 weeks, then 3 times weekly moving forward.   famotidine 20 MG tablet Commonly known as: PEPCID Take 20 mg by mouth daily as needed for heartburn or indigestion.   Gemtesa  75 MG Tabs Generic drug: Vibegron  TAKE 1 TABLET BY MOUTH DAILY   MAGNESIUM GLYCINATE PO Take 400 mg by mouth daily.   melatonin 5 MG Tabs Take 5 mg by mouth at bedtime.   multivitamin tablet Take 1 tablet by mouth daily.   PHAZYME GAS & ACID MAX ST PO Take 1 tablet by mouth as needed (bloating).   PSYLLIUM HUSK PO Take 3 capsules by mouth in the morning and at bedtime.   PUMPKIN SEED OIL PO Take 200 mg by mouth in the morning and at bedtime.   RepHresh Pro-B Caps Take 1 Capful by mouth daily.   ST JOHNS WORT PO Take 2 capsules by mouth in the morning and at bedtime.        Allergies: Allergies[1]  Family History: Family History  Problem Relation Age of Onset   Stroke Mother        x 2   Heart disease Mother        congenital arrhythmia and CHF   Depression Mother    Atrial fibrillation Mother    Arthritis Mother    Vision loss Mother    Depression Father    Prostate cancer Brother    Atrial fibrillation Brother    ADD / ADHD Son    Birth defects Son    Intellectual disability Son    Arthritis Maternal Aunt    Stomach cancer Paternal Uncle    Cancer Paternal Uncle    Colon cancer Neg Hx    Esophageal cancer Neg Hx    Kidney cancer Neg Hx    Bladder Cancer Neg Hx    Breast cancer Neg Hx    Renal cancer Neg Hx    Uterine cancer Neg Hx     Social History:  reports that she has never smoked.  She has never used smokeless tobacco. She reports that she does not drink alcohol and does not use drugs.  ROS:  Physical Exam: There were no vitals taken for this visit.  Constitutional:  Alert and oriented, No acute distress. HEENT: [place] AT, moist mucus membranes.  Trachea midline, no masses. Cardiovascular: No clubbing, cyanosis, or edema. Respiratory: Normal respiratory effort, no increased work of breathing. GI: Abdomen is soft, nontender, nondistended, no abdominal masses GU: On pelvic examination patient had mild grade 2 hypermobility the bladder neck and negative Pap test.  She had an Estring  ring hide in the vaginal vault.  No significant prolapse Skin: No rashes, bruises or suspicious lesions. Lymph: No cervical or inguinal adenopathy. Neurologic: Grossly intact, no focal deficits, moving all 4 extremities. Psychiatric: Normal mood and affect.  Laboratory Data: Lab Results  Component Value Date   WBC 4.0 06/14/2022   HGB 13.4 06/14/2022   HCT 41.4 06/14/2022   MCV 90.8 06/14/2022   PLT 238.0 06/14/2022    Lab Results  Component Value Date   CREATININE 0.76 08/12/2023    No results found for: PSA  No results found for: TESTOSTERONE  Lab Results  Component Value Date   HGBA1C 6.3 (A) 11/12/2023    Urinalysis    Component Value Date/Time   COLORURINE YELLOW (A) 12/08/2016 0913   APPEARANCEUR Clear 08/15/2023 1101   LABSPEC 1.020 12/08/2016 0913   LABSPEC 1.036 07/20/2012 1420   PHURINE 7.0 12/08/2016 0913   GLUCOSEU Negative 08/15/2023 1101   GLUCOSEU Negative 07/20/2012 1420   HGBUR NEGATIVE 12/08/2016 0913   BILIRUBINUR negative 12/29/2023 0925   BILIRUBINUR Negative 08/15/2023 1101   BILIRUBINUR Negative 07/20/2012 1420   KETONESUR negative 12/29/2023 0925   KETONESUR 20 (A) 12/08/2016 0913   PROTEINUR Negative 08/15/2023 1101   PROTEINUR NEGATIVE 12/08/2016 0913   UROBILINOGEN 0.2  12/29/2023 0925   NITRITE Negative 12/29/2023 0925   NITRITE Negative 08/15/2023 1101   NITRITE NEGATIVE 12/08/2016 0913   LEUKOCYTESUR Negative 12/29/2023 0925   LEUKOCYTESUR Negative 08/15/2023 1101   LEUKOCYTESUR Trace 07/20/2012 1420    Pertinent Imaging: Urine reviewed and sent for culture.  Chart reviewed  Assessment & Plan: Patient has urge incontinence.  She has foot on the floor syndrome.  She gets infrequent urinary tract infections.  Role of urodynamics discussed.  Patient wants to think about it.  Stay on Gemtesa  renewed.  Stay on estrogen.  Call if she wants urodynamics.  See in 1 year.  She tolerates symptoms quite well.  Multiple causes of nocturia which is most bothersome was discussed today.  1. OAB (overactive bladder) (Primary)    No follow-ups on file.  Shannon DELENA Elizabeth, MD  [facility] [address] [place], [place] [address] [phone]     [1]  Allergies Allergen Reactions   Adhesive [Tape] Rash    (not sure if rash was from ointment or tape)   Alpha-Gal Rash    (red meat S/P tick bite)   Neomycin-Bacitracin Zn-Polymyx Rash    Ointment   Nickel Rash

## 2024-01-06 LAB — URINALYSIS, COMPLETE
Bilirubin, UA: NEGATIVE
Glucose, UA: NEGATIVE
Ketones, UA: NEGATIVE
Leukocytes,UA: NEGATIVE
Nitrite, UA: NEGATIVE
Protein,UA: NEGATIVE
RBC, UA: NEGATIVE
Specific Gravity, UA: 1.025 (ref 1.005–1.030)
Urobilinogen, Ur: 0.2 mg/dL (ref 0.2–1.0)
pH, UA: 6 (ref 5.0–7.5)

## 2024-01-06 LAB — MICROSCOPIC EXAMINATION
Epithelial Cells (non renal): >10 /HPF — AB (ref 0–10)
RBC, Urine: NONE SEEN /HPF (ref 0–2)

## 2024-01-07 ENCOUNTER — Ambulatory Visit: Admitting: Urology

## 2024-01-08 LAB — CULTURE, URINE COMPREHENSIVE

## 2024-01-09 ENCOUNTER — Ambulatory Visit: Admitting: Urology

## 2024-01-13 ENCOUNTER — Ambulatory Visit: Admitting: Urology

## 2024-01-20 ENCOUNTER — Ambulatory Visit: Admitting: Physician Assistant

## 2024-01-20 VITALS — BP 126/58 | HR 69

## 2024-01-20 DIAGNOSIS — N3281 Overactive bladder: Secondary | ICD-10-CM | POA: Diagnosis not present

## 2024-01-20 NOTE — Progress Notes (Signed)
 PTNS  Session # 17  Health & Social Factors: no change Caffeine: 0 Alcohol: 0 Daytime voids #per day: 10-12 Night-time voids #per night: 3 Urgency: mild-strong Incontinence Episodes #per day: 0-2 Ankle used: right Treatment Setting: 5 Feeling/ Response: sensory Comments: Patient tolerated well.  Performed By: Garielle Mroz, PA-C   Follow Up: 1 month

## 2024-02-02 ENCOUNTER — Encounter: Payer: Self-pay | Admitting: *Deleted

## 2024-02-20 ENCOUNTER — Ambulatory Visit

## 2024-02-26 ENCOUNTER — Ambulatory Visit

## 2024-04-14 ENCOUNTER — Ambulatory Visit: Admitting: Urology

## 2024-06-25 ENCOUNTER — Ambulatory Visit

## 2024-08-09 ENCOUNTER — Other Ambulatory Visit

## 2024-08-16 ENCOUNTER — Encounter: Admitting: Family Medicine

## 2025-01-03 ENCOUNTER — Ambulatory Visit: Admitting: Urology
